# Patient Record
Sex: Male | Born: 1935 | Race: White | Hispanic: No | Smoking: Former smoker
Health system: Southern US, Community
[De-identification: ages and names within clinical notes are randomized; demographics above are authoritative.]

## PROBLEM LIST (undated history)

## (undated) DIAGNOSIS — K219 Gastro-esophageal reflux disease without esophagitis: Secondary | ICD-10-CM

## (undated) DIAGNOSIS — I340 Nonrheumatic mitral (valve) insufficiency: Secondary | ICD-10-CM

## (undated) DIAGNOSIS — C801 Malignant (primary) neoplasm, unspecified: Secondary | ICD-10-CM

## (undated) DIAGNOSIS — I5020 Unspecified systolic (congestive) heart failure: Secondary | ICD-10-CM

## (undated) DIAGNOSIS — I77810 Thoracic aortic ectasia: Secondary | ICD-10-CM

## (undated) DIAGNOSIS — I712 Thoracic aortic aneurysm, without rupture, unspecified: Secondary | ICD-10-CM

## (undated) DIAGNOSIS — N400 Enlarged prostate without lower urinary tract symptoms: Secondary | ICD-10-CM

## (undated) DIAGNOSIS — E538 Deficiency of other specified B group vitamins: Secondary | ICD-10-CM

## (undated) DIAGNOSIS — L719 Rosacea, unspecified: Secondary | ICD-10-CM

## (undated) DIAGNOSIS — I491 Atrial premature depolarization: Secondary | ICD-10-CM

## (undated) DIAGNOSIS — I4581 Long QT syndrome: Secondary | ICD-10-CM

## (undated) DIAGNOSIS — I428 Other cardiomyopathies: Secondary | ICD-10-CM

## (undated) DIAGNOSIS — I2510 Atherosclerotic heart disease of native coronary artery without angina pectoris: Secondary | ICD-10-CM

## (undated) DIAGNOSIS — I861 Scrotal varices: Secondary | ICD-10-CM

## (undated) DIAGNOSIS — G8929 Other chronic pain: Secondary | ICD-10-CM

## (undated) DIAGNOSIS — I351 Nonrheumatic aortic (valve) insufficiency: Secondary | ICD-10-CM

## (undated) DIAGNOSIS — R059 Cough, unspecified: Secondary | ICD-10-CM

## (undated) DIAGNOSIS — Z7901 Long term (current) use of anticoagulants: Secondary | ICD-10-CM

## (undated) DIAGNOSIS — S3609XA Other injury of spleen, initial encounter: Secondary | ICD-10-CM

## (undated) DIAGNOSIS — Z972 Presence of dental prosthetic device (complete) (partial): Secondary | ICD-10-CM

## (undated) DIAGNOSIS — I700 Atherosclerosis of aorta: Secondary | ICD-10-CM

## (undated) DIAGNOSIS — R05 Cough: Secondary | ICD-10-CM

## (undated) DIAGNOSIS — Z87891 Personal history of nicotine dependence: Secondary | ICD-10-CM

## (undated) DIAGNOSIS — C4490 Unspecified malignant neoplasm of skin, unspecified: Secondary | ICD-10-CM

## (undated) DIAGNOSIS — R062 Wheezing: Secondary | ICD-10-CM

## (undated) DIAGNOSIS — M25569 Pain in unspecified knee: Secondary | ICD-10-CM

## (undated) DIAGNOSIS — J189 Pneumonia, unspecified organism: Secondary | ICD-10-CM

## (undated) DIAGNOSIS — E785 Hyperlipidemia, unspecified: Secondary | ICD-10-CM

## (undated) DIAGNOSIS — N529 Male erectile dysfunction, unspecified: Secondary | ICD-10-CM

## (undated) DIAGNOSIS — I480 Paroxysmal atrial fibrillation: Secondary | ICD-10-CM

## (undated) DIAGNOSIS — R112 Nausea with vomiting, unspecified: Secondary | ICD-10-CM

## (undated) DIAGNOSIS — I4510 Unspecified right bundle-branch block: Secondary | ICD-10-CM

## (undated) DIAGNOSIS — M1990 Unspecified osteoarthritis, unspecified site: Secondary | ICD-10-CM

## (undated) DIAGNOSIS — I272 Pulmonary hypertension, unspecified: Secondary | ICD-10-CM

## (undated) DIAGNOSIS — J439 Emphysema, unspecified: Secondary | ICD-10-CM

## (undated) DIAGNOSIS — J90 Pleural effusion, not elsewhere classified: Secondary | ICD-10-CM

## (undated) DIAGNOSIS — K649 Unspecified hemorrhoids: Secondary | ICD-10-CM

## (undated) DIAGNOSIS — N183 Chronic kidney disease, stage 3 unspecified: Secondary | ICD-10-CM

## (undated) DIAGNOSIS — R600 Localized edema: Secondary | ICD-10-CM

## (undated) DIAGNOSIS — R609 Edema, unspecified: Secondary | ICD-10-CM

## (undated) DIAGNOSIS — Z9889 Other specified postprocedural states: Secondary | ICD-10-CM

## (undated) HISTORY — DX: Pain in unspecified knee: M25.569

## (undated) HISTORY — PX: TONSILLECTOMY: SUR1361

## (undated) HISTORY — DX: Atherosclerotic heart disease of native coronary artery without angina pectoris: I25.10

## (undated) HISTORY — PX: APPENDECTOMY: SHX54

## (undated) HISTORY — DX: Other injury of spleen, initial encounter: S36.09XA

## (undated) HISTORY — DX: Other disorders of bilirubin metabolism: E80.6

## (undated) HISTORY — DX: Other chronic pain: G89.29

## (undated) HISTORY — DX: Unspecified hemorrhoids: K64.9

## (undated) HISTORY — DX: Hyperlipidemia, unspecified: E78.5

## (undated) HISTORY — PX: HERNIA REPAIR: SHX51

## (undated) HISTORY — PX: ABDOMINAL SURGERY: SHX537

## (undated) HISTORY — PX: EYE SURGERY: SHX253

## (undated) HISTORY — PX: KNEE ARTHROSCOPY: SUR90

---

## 1985-12-16 HISTORY — PX: COLONOSCOPY: SHX174

## 2000-12-16 HISTORY — PX: CARDIAC CATHETERIZATION: SHX172

## 2001-01-29 HISTORY — PX: CARDIAC CATHETERIZATION: SHX172

## 2003-12-17 HISTORY — PX: CARDIAC CATHETERIZATION: SHX172

## 2004-07-20 ENCOUNTER — Inpatient Hospital Stay (HOSPITAL_BASED_OUTPATIENT_CLINIC_OR_DEPARTMENT_OTHER): Admission: RE | Admit: 2004-07-20 | Discharge: 2004-07-20 | Payer: Self-pay | Admitting: Cardiology

## 2004-07-20 HISTORY — PX: CARDIAC CATHETERIZATION: SHX172

## 2013-01-26 ENCOUNTER — Ambulatory Visit: Payer: Self-pay | Admitting: Family Medicine

## 2014-02-22 ENCOUNTER — Ambulatory Visit: Payer: Self-pay | Admitting: Family Medicine

## 2015-03-08 ENCOUNTER — Ambulatory Visit: Payer: Self-pay | Admitting: Family Medicine

## 2015-03-08 DIAGNOSIS — R51 Headache: Secondary | ICD-10-CM | POA: Diagnosis not present

## 2015-03-13 DIAGNOSIS — E785 Hyperlipidemia, unspecified: Secondary | ICD-10-CM | POA: Diagnosis not present

## 2015-03-13 DIAGNOSIS — G8929 Other chronic pain: Secondary | ICD-10-CM | POA: Diagnosis not present

## 2015-03-13 DIAGNOSIS — M25562 Pain in left knee: Secondary | ICD-10-CM | POA: Diagnosis not present

## 2015-03-15 ENCOUNTER — Ambulatory Visit: Payer: Self-pay | Admitting: Family Medicine

## 2015-03-31 DIAGNOSIS — M25562 Pain in left knee: Secondary | ICD-10-CM | POA: Diagnosis not present

## 2015-03-31 DIAGNOSIS — M25561 Pain in right knee: Secondary | ICD-10-CM | POA: Diagnosis not present

## 2015-03-31 DIAGNOSIS — M1712 Unilateral primary osteoarthritis, left knee: Secondary | ICD-10-CM | POA: Insufficient documentation

## 2015-03-31 DIAGNOSIS — M170 Bilateral primary osteoarthritis of knee: Secondary | ICD-10-CM | POA: Diagnosis not present

## 2015-06-12 DIAGNOSIS — L905 Scar conditions and fibrosis of skin: Secondary | ICD-10-CM | POA: Diagnosis not present

## 2015-06-12 DIAGNOSIS — D1800 Hemangioma unspecified site: Secondary | ICD-10-CM | POA: Diagnosis not present

## 2015-06-12 DIAGNOSIS — L570 Actinic keratosis: Secondary | ICD-10-CM | POA: Diagnosis not present

## 2015-06-12 DIAGNOSIS — L821 Other seborrheic keratosis: Secondary | ICD-10-CM | POA: Diagnosis not present

## 2015-06-12 DIAGNOSIS — L219 Seborrheic dermatitis, unspecified: Secondary | ICD-10-CM | POA: Diagnosis not present

## 2015-06-12 DIAGNOSIS — L719 Rosacea, unspecified: Secondary | ICD-10-CM | POA: Diagnosis not present

## 2015-06-12 DIAGNOSIS — L578 Other skin changes due to chronic exposure to nonionizing radiation: Secondary | ICD-10-CM | POA: Diagnosis not present

## 2015-06-12 DIAGNOSIS — Z1283 Encounter for screening for malignant neoplasm of skin: Secondary | ICD-10-CM | POA: Diagnosis not present

## 2015-08-23 ENCOUNTER — Ambulatory Visit (INDEPENDENT_AMBULATORY_CARE_PROVIDER_SITE_OTHER): Payer: Commercial Managed Care - HMO

## 2015-08-23 DIAGNOSIS — Z23 Encounter for immunization: Secondary | ICD-10-CM | POA: Diagnosis not present

## 2015-09-20 DIAGNOSIS — H2513 Age-related nuclear cataract, bilateral: Secondary | ICD-10-CM | POA: Diagnosis not present

## 2015-09-20 DIAGNOSIS — D3132 Benign neoplasm of left choroid: Secondary | ICD-10-CM | POA: Diagnosis not present

## 2015-10-04 ENCOUNTER — Encounter: Payer: Self-pay | Admitting: Family Medicine

## 2015-10-04 ENCOUNTER — Ambulatory Visit (INDEPENDENT_AMBULATORY_CARE_PROVIDER_SITE_OTHER): Payer: Commercial Managed Care - HMO | Admitting: Family Medicine

## 2015-10-04 VITALS — BP 112/68 | HR 69 | Temp 98.0°F | Resp 14 | Ht 73.0 in | Wt 222.4 lb

## 2015-10-04 DIAGNOSIS — N529 Male erectile dysfunction, unspecified: Secondary | ICD-10-CM | POA: Insufficient documentation

## 2015-10-04 DIAGNOSIS — N1831 Chronic kidney disease, stage 3a: Secondary | ICD-10-CM | POA: Insufficient documentation

## 2015-10-04 DIAGNOSIS — Z23 Encounter for immunization: Secondary | ICD-10-CM | POA: Insufficient documentation

## 2015-10-04 DIAGNOSIS — E785 Hyperlipidemia, unspecified: Secondary | ICD-10-CM | POA: Insufficient documentation

## 2015-10-04 DIAGNOSIS — H269 Unspecified cataract: Secondary | ICD-10-CM

## 2015-10-04 DIAGNOSIS — R519 Headache, unspecified: Secondary | ICD-10-CM | POA: Insufficient documentation

## 2015-10-04 DIAGNOSIS — L989 Disorder of the skin and subcutaneous tissue, unspecified: Secondary | ICD-10-CM | POA: Insufficient documentation

## 2015-10-04 DIAGNOSIS — R739 Hyperglycemia, unspecified: Secondary | ICD-10-CM | POA: Insufficient documentation

## 2015-10-04 DIAGNOSIS — N400 Enlarged prostate without lower urinary tract symptoms: Secondary | ICD-10-CM | POA: Insufficient documentation

## 2015-10-04 DIAGNOSIS — M19019 Primary osteoarthritis, unspecified shoulder: Secondary | ICD-10-CM | POA: Insufficient documentation

## 2015-10-04 DIAGNOSIS — R51 Headache: Secondary | ICD-10-CM

## 2015-10-04 DIAGNOSIS — M179 Osteoarthritis of knee, unspecified: Secondary | ICD-10-CM | POA: Insufficient documentation

## 2015-10-04 DIAGNOSIS — I4581 Long QT syndrome: Secondary | ICD-10-CM | POA: Insufficient documentation

## 2015-10-04 DIAGNOSIS — I861 Scrotal varices: Secondary | ICD-10-CM | POA: Insufficient documentation

## 2015-10-04 DIAGNOSIS — K649 Unspecified hemorrhoids: Secondary | ICD-10-CM | POA: Insufficient documentation

## 2015-10-04 DIAGNOSIS — L570 Actinic keratosis: Secondary | ICD-10-CM | POA: Insufficient documentation

## 2015-10-04 DIAGNOSIS — M25569 Pain in unspecified knee: Secondary | ICD-10-CM | POA: Insufficient documentation

## 2015-10-04 DIAGNOSIS — R21 Rash and other nonspecific skin eruption: Secondary | ICD-10-CM | POA: Insufficient documentation

## 2015-10-04 DIAGNOSIS — M5136 Other intervertebral disc degeneration, lumbar region: Secondary | ICD-10-CM | POA: Insufficient documentation

## 2015-10-04 DIAGNOSIS — M1710 Unilateral primary osteoarthritis, unspecified knee: Secondary | ICD-10-CM | POA: Insufficient documentation

## 2015-10-04 DIAGNOSIS — N183 Chronic kidney disease, stage 3 unspecified: Secondary | ICD-10-CM | POA: Insufficient documentation

## 2015-10-04 DIAGNOSIS — I2510 Atherosclerotic heart disease of native coronary artery without angina pectoris: Secondary | ICD-10-CM | POA: Insufficient documentation

## 2015-10-04 DIAGNOSIS — N189 Chronic kidney disease, unspecified: Secondary | ICD-10-CM | POA: Insufficient documentation

## 2015-10-04 NOTE — Progress Notes (Signed)
Name: Anthony Mosley   MRN: [id number]    DOB: 03/10/1935   Date:10/04/2015       Progress Note  Subjective  Chief Complaint  Chief Complaint  Patient presents with  . Referral    Patient was recently seen at [facility] and was told that he need cataract surgery. He is here to get a referral so that they can proceed with the surgical consult on Monday.    HPI  Pt. Is here for referral to Ophthalmology for Cataract Surgery. Initial appointment with Ophthalmology scheduled for October 24 th, 2016 by Dr. Warren Lacy. Pt. Was diagnosed with cataracts 4-5 years ago.  Past Medical History  Diagnosis Date  . Hyperbilirubinemia   . CAD (coronary artery disease)   . Hemorrhoids   . Chronic knee pain     Past Surgical History  Procedure Laterality Date  . Colonoscopy  1987    Family History  Problem Relation Age of Onset  . Alzheimer's disease Mother   . Pulmonary embolism Father   . Diabetes Father   . Diabetes Brother     Social History   Social History  . Marital Status: Married    Spouse Name: N/A  . Number of Children: N/A  . Years of Education: N/A   Occupational History  . Not on file.   Social History Main Topics  . Smoking status: Former Research scientist (life sciences)  . Smokeless tobacco: Not on file     Comment: quit over 24 yrs ago  . Alcohol Use: No  . Drug Use: No  . Sexual Activity: No   Other Topics Concern  . Not on file   Social History Narrative  . No narrative on file     Current outpatient prescriptions:  .  aspirin 81 MG chewable tablet, Chew by mouth., Disp: , Rfl:  .  B COMPLEX VITAMINS PO, Take by mouth., Disp: , Rfl:  .  simvastatin (ZOCOR) 40 MG tablet, , Disp: , Rfl:   Allergies  Allergen Reactions  . Avodart  [Dutasteride] Itching  . Macrolides And Ketolides     Other reaction(s): Other (See Comments) Due to prolonged Q-T syndrome  . Norvasc  [Amlodipine]     bradycardia   Review of Systems  Eyes: Positive for blurred vision. Negative  for double vision and pain.    Objective  Filed Vitals:   10/04/15 0851  BP: 112/68  Pulse: 69  Temp: 98 F (36.7 C)  TempSrc: Oral  Resp: 14  Height: 6\' 1"  (1.854 m)  Weight: 222 lb 6.4 oz (100.88 kg)  SpO2: 94%    Physical Exam  Constitutional: He is oriented to person, place, and time and well-developed, well-nourished, and in no distress.  Eyes:  Cataracts in both eyes  Cardiovascular: Normal rate and regular rhythm.   Pulmonary/Chest: Effort normal and breath sounds normal.  Neurological: He is alert and oriented to person, place, and time.  Nursing note and vitals reviewed.  Assessment & Plan  1. Cataract of both eyes  - Ambulatory referral to Ophthalmology   [facility] 10/04/2015 9:05 AM

## 2015-10-09 DIAGNOSIS — H2511 Age-related nuclear cataract, right eye: Secondary | ICD-10-CM | POA: Diagnosis not present

## 2015-10-11 DIAGNOSIS — H2511 Age-related nuclear cataract, right eye: Secondary | ICD-10-CM | POA: Diagnosis not present

## 2015-10-17 ENCOUNTER — Encounter: Payer: Self-pay | Admitting: *Deleted

## 2015-10-24 ENCOUNTER — Ambulatory Visit
Admission: RE | Admit: 2015-10-24 | Discharge: 2015-10-24 | Disposition: A | Discharge status: Home / Self Care | Payer: Commercial Managed Care - HMO | Source: Ambulatory Visit | Attending: Ophthalmology | Admitting: Ophthalmology

## 2015-10-24 ENCOUNTER — Ambulatory Visit: Payer: Commercial Managed Care - HMO | Admitting: Anesthesiology

## 2015-10-24 ENCOUNTER — Encounter: Payer: Self-pay | Admitting: *Deleted

## 2015-10-24 ENCOUNTER — Encounter
Admission: RE | Disposition: A | Discharge status: Home / Self Care | Payer: Self-pay | Source: Ambulatory Visit | Attending: Ophthalmology

## 2015-10-24 DIAGNOSIS — Z79899 Other long term (current) drug therapy: Secondary | ICD-10-CM | POA: Diagnosis not present

## 2015-10-24 DIAGNOSIS — Z888 Allergy status to other drugs, medicaments and biological substances status: Secondary | ICD-10-CM | POA: Insufficient documentation

## 2015-10-24 DIAGNOSIS — H269 Unspecified cataract: Secondary | ICD-10-CM | POA: Diagnosis present

## 2015-10-24 DIAGNOSIS — Z9049 Acquired absence of other specified parts of digestive tract: Secondary | ICD-10-CM | POA: Insufficient documentation

## 2015-10-24 DIAGNOSIS — I2510 Atherosclerotic heart disease of native coronary artery without angina pectoris: Secondary | ICD-10-CM | POA: Diagnosis not present

## 2015-10-24 DIAGNOSIS — Z87891 Personal history of nicotine dependence: Secondary | ICD-10-CM | POA: Diagnosis not present

## 2015-10-24 DIAGNOSIS — M1990 Unspecified osteoarthritis, unspecified site: Secondary | ICD-10-CM | POA: Insufficient documentation

## 2015-10-24 DIAGNOSIS — L719 Rosacea, unspecified: Secondary | ICD-10-CM | POA: Insufficient documentation

## 2015-10-24 DIAGNOSIS — Z7982 Long term (current) use of aspirin: Secondary | ICD-10-CM | POA: Diagnosis not present

## 2015-10-24 DIAGNOSIS — H2511 Age-related nuclear cataract, right eye: Secondary | ICD-10-CM | POA: Diagnosis not present

## 2015-10-24 DIAGNOSIS — Z9889 Other specified postprocedural states: Secondary | ICD-10-CM | POA: Diagnosis not present

## 2015-10-24 DIAGNOSIS — E7800 Pure hypercholesterolemia, unspecified: Secondary | ICD-10-CM | POA: Insufficient documentation

## 2015-10-24 HISTORY — DX: Unspecified osteoarthritis, unspecified site: M19.90

## 2015-10-24 HISTORY — DX: Cough, unspecified: R05.9

## 2015-10-24 HISTORY — DX: Malignant (primary) neoplasm, unspecified: C80.1

## 2015-10-24 HISTORY — PX: CATARACT EXTRACTION W/PHACO: SHX586

## 2015-10-24 HISTORY — DX: Cough: R05

## 2015-10-24 HISTORY — DX: Rosacea, unspecified: L71.9

## 2015-10-24 HISTORY — DX: Edema, unspecified: R60.9

## 2015-10-24 SURGERY — PHACOEMULSIFICATION, CATARACT, WITH IOL INSERTION
Anesthesia: Monitor Anesthesia Care | Site: Eye | Laterality: Right | Wound class: Clean

## 2015-10-24 MED ORDER — EPINEPHRINE HCL 1 MG/ML IJ SOLN
INTRAMUSCULAR | Status: AC
  Filled 2015-10-24: qty 1

## 2015-10-24 MED ORDER — TETRACAINE HCL 0.5 % OP SOLN
OPHTHALMIC | Status: AC
  Administered 2015-10-24: 1 [drp] via OPHTHALMIC
  Filled 2015-10-24: qty 2

## 2015-10-24 MED ORDER — CARBACHOL 0.01 % IO SOLN
INTRAOCULAR | Status: DC | PRN
  Administered 2015-10-24: 0.5 mL via INTRAOCULAR

## 2015-10-24 MED ORDER — NA CHONDROIT SULF-NA HYALURON 40-17 MG/ML IO SOLN
INTRAOCULAR | Status: DC | PRN
  Administered 2015-10-24: 1 mL via INTRAOCULAR

## 2015-10-24 MED ORDER — SODIUM CHLORIDE 0.9 % IV SOLN
INTRAVENOUS | Status: DC
  Administered 2015-10-24 (×3): via INTRAVENOUS

## 2015-10-24 MED ORDER — FENTANYL CITRATE (PF) 100 MCG/2ML IJ SOLN
INTRAMUSCULAR | Status: DC | PRN
  Administered 2015-10-24: 50 ug via INTRAVENOUS

## 2015-10-24 MED ORDER — EPINEPHRINE HCL 1 MG/ML IJ SOLN
INTRAOCULAR | Status: DC | PRN
  Administered 2015-10-24: 1 mL via OPHTHALMIC

## 2015-10-24 MED ORDER — MOXIFLOXACIN HCL 0.5 % OP SOLN
OPHTHALMIC | Status: AC
  Filled 2015-10-24: qty 3

## 2015-10-24 MED ORDER — TETRACAINE HCL 0.5 % OP SOLN
1.0000 [drp] | OPHTHALMIC | Status: AC | PRN
  Administered 2015-10-24: 1 [drp] via OPHTHALMIC

## 2015-10-24 MED ORDER — CEFUROXIME OPHTHALMIC INJECTION 1 MG/0.1 ML
INJECTION | OPHTHALMIC | Status: DC | PRN
  Administered 2015-10-24: 0.1 mL via INTRACAMERAL

## 2015-10-24 MED ORDER — MOXIFLOXACIN HCL 0.5 % OP SOLN: 1.0000 [drp] | OPHTHALMIC | Status: DC | PRN

## 2015-10-24 MED ORDER — MOXIFLOXACIN HCL 0.5 % OP SOLN
OPHTHALMIC | Status: DC | PRN
  Administered 2015-10-24: 1 [drp] via OPHTHALMIC

## 2015-10-24 MED ORDER — NA CHONDROIT SULF-NA HYALURON 40-17 MG/ML IO SOLN
INTRAOCULAR | Status: AC
  Filled 2015-10-24: qty 1

## 2015-10-24 MED ORDER — ARMC OPHTHALMIC DILATING GEL
1.0000 "application " | OPHTHALMIC | Status: DC | PRN
  Administered 2015-10-24: 1 via OPHTHALMIC

## 2015-10-24 MED ORDER — POVIDONE-IODINE 5 % OP SOLN
OPHTHALMIC | Status: AC
  Administered 2015-10-24: 1 via OPHTHALMIC
  Filled 2015-10-24: qty 30

## 2015-10-24 MED ORDER — POVIDONE-IODINE 5 % OP SOLN
1.0000 "application " | OPHTHALMIC | Status: AC | PRN
  Administered 2015-10-24: 1 via OPHTHALMIC

## 2015-10-24 MED ORDER — CEFUROXIME OPHTHALMIC INJECTION 1 MG/0.1 ML
INJECTION | OPHTHALMIC | Status: AC
  Filled 2015-10-24: qty 0.1

## 2015-10-24 MED ORDER — ARMC OPHTHALMIC DILATING GEL
OPHTHALMIC | Status: AC
  Administered 2015-10-24: 1 via OPHTHALMIC
  Filled 2015-10-24: qty 0.25

## 2015-10-24 SURGICAL SUPPLY — 24 items
CANNULA ANT/CHMB 27G (MISCELLANEOUS) ×1 IMPLANT
CANNULA ANT/CHMB 27GA (MISCELLANEOUS) ×3 IMPLANT
CUP MEDICINE 2OZ PLAST GRAD ST (MISCELLANEOUS) ×3 IMPLANT
GLOVE BIO SURGEON STRL SZ8 (GLOVE) ×3 IMPLANT
GLOVE BIOGEL M 6.5 STRL (GLOVE) ×3 IMPLANT
GLOVE SURG LX 8.0 MICRO (GLOVE) ×2
GLOVE SURG LX STRL 8.0 MICRO (GLOVE) ×1 IMPLANT
GOWN STRL REUS W/ TWL LRG LVL3 (GOWN DISPOSABLE) ×2 IMPLANT
GOWN STRL REUS W/TWL LRG LVL3 (GOWN DISPOSABLE) ×6
LENS IOL TECNIS 21 (Intraocular Lens) ×1 IMPLANT
LENS IOL TECNIS 21.0 (Intraocular Lens) ×3 IMPLANT
LENS IOL TECNIS MONO 1P 21.0 (Intraocular Lens) IMPLANT
PACK CATARACT (MISCELLANEOUS) ×3 IMPLANT
PACK CATARACT BRASINGTON LX (MISCELLANEOUS) ×3 IMPLANT
PACK EYE AFTER SURG (MISCELLANEOUS) ×3 IMPLANT
SOL BSS BAG (MISCELLANEOUS) ×3
SOL PREP PVP 2OZ (MISCELLANEOUS) ×3
SOLUTION BSS BAG (MISCELLANEOUS) ×1 IMPLANT
SOLUTION PREP PVP 2OZ (MISCELLANEOUS) ×1 IMPLANT
SYR 3ML LL SCALE MARK (SYRINGE) ×3 IMPLANT
SYR 5ML LL (SYRINGE) ×3 IMPLANT
SYR TB 1ML 27GX1/2 LL (SYRINGE) ×3 IMPLANT
WATER STERILE IRR 1000ML POUR (IV SOLUTION) ×3 IMPLANT
WIPE NON LINTING 3.25X3.25 (MISCELLANEOUS) ×3 IMPLANT

## 2015-10-24 NOTE — Transfer of Care (Signed)
Immediate Anesthesia Transfer of Care Note  Patient: Anthony Mosley  Procedure(s) Performed: Procedure(s) with comments: CATARACT EXTRACTION PHACO AND INTRAOCULAR LENS PLACEMENT (IOC) (Right) - [place] 00:52 AP% 23.5 CDE 12.35 fluid pack lot # [id number]  Patient Location: PACU  Anesthesia Type:MAC  Level of Consciousness: awake  Airway & Oxygen Therapy: Patient Spontanous Breathing  Post-op Assessment: Report given to RN  Post vital signs: Reviewed and stable  Last Vitals:  Filed Vitals:   10/24/15 0901  BP: 121/77  Pulse: 57  Temp: 36.4 C  Resp: 16    Complications: No apparent anesthesia complications

## 2015-10-24 NOTE — Op Note (Signed)
PREOPERATIVE DIAGNOSIS:  Nuclear sclerotic cataract of the left eye.   POSTOPERATIVE DIAGNOSIS:  nuclear sclerotic cataract right eye   OPERATIVE PROCEDURE:  Procedure(s): CATARACT EXTRACTION PHACO AND INTRAOCULAR LENS PLACEMENT (IOC)   SURGEON:  [name], MD.   ANESTHESIA:   Anesthesiologist: Gunnar Bulla, MD CRNA: Allean Found, CRNA  1.      Managed anesthesia care. 2.      Topical tetracaine drops followed by 2% Xylocaine jelly applied in the preoperative holding area.   COMPLICATIONS:  None.   TECHNIQUE:   Stop and chop   DESCRIPTION OF PROCEDURE:  The patient was examined and consented in the preoperative holding area where the aforementioned topical anesthesia was applied to the left eye and then brought back to the Operating Room where the left eye was prepped and draped in the usual sterile ophthalmic fashion and a lid speculum was placed. A paracentesis was created with the side port blade and the anterior chamber was filled with viscoelastic. A near clear corneal incision was performed with the steel keratome. A continuous curvilinear capsulorrhexis was performed with a cystotome followed by the capsulorrhexis forceps. Hydrodissection and hydrodelineation were carried out with BSS on a blunt cannula. The lens was removed in a stop and chop  technique and the remaining cortical material was removed with the irrigation-aspiration handpiece. The capsular bag was inflated with viscoelastic and the Technis ZCB00 lens was placed in the capsular bag without complication. The remaining viscoelastic was removed from the eye with the irrigation-aspiration handpiece. The wounds were hydrated. The anterior chamber was flushed with Miostat and the eye was inflated to physiologic pressure. 0.1 mL of cefuroxime concentration 10 mg/mL was placed in the anterior chamber. The wounds were found to be water tight. The eye was dressed with Vigamox. The patient was given protective glasses to wear  throughout the day and a shield with which to sleep tonight. The patient was also given drops with which to begin a drop regimen today and will follow-up with me in one day.  Implant Name Type Inv. Item Serial No. Manufacturer Lot No. LRB No. Used  LENS IMPL INTRAOC ZCB00 21.0 - [id number] Intraocular Lens LENS IMPL INTRAOC ZCB00 21.0 [id number]   Right 1   Procedure(s) with comments: CATARACT EXTRACTION PHACO AND INTRAOCULAR LENS PLACEMENT (IOC) (Right) - [place] 00:52 AP% 23.5 CDE 12.35 fluid pack lot # [id number]  Electronically signed: [place] 10/24/2015 10:53 AM

## 2015-10-24 NOTE — H&P (Signed)
  All labs reviewed. Abnormal studies sent to patients PCP when indicated.  Previous H&P reviewed, patient examined, there are NO CHANGES.  Anthony Mosley LOUIS11/8/201610:18 AM

## 2015-10-24 NOTE — Anesthesia Preprocedure Evaluation (Signed)
Anesthesia Evaluation  Patient identified by MRN, date of birth, ID band Patient awake    Reviewed: Allergy & Precautions, NPO status , Patient's Chart, lab work & pertinent test results, reviewed documented beta blocker date and time   Airway Mallampati: III  TM Distance: >3 FB     Dental  (+) Chipped   Pulmonary former smoker,           Cardiovascular + CAD       Neuro/Psych    GI/Hepatic   Endo/Other    Renal/GU Renal InsufficiencyRenal disease     Musculoskeletal  (+) Arthritis ,   Abdominal   Peds  Hematology   Anesthesia Other Findings   Reproductive/Obstetrics                             Anesthesia Physical Anesthesia Plan  ASA: III  Anesthesia Plan: MAC   Post-op Pain Management:    Induction: Intravenous  Airway Management Planned: Nasal Cannula  Additional Equipment:   Intra-op Plan:   Post-operative Plan:   Informed Consent: I have reviewed the patients History and Physical, chart, labs and discussed the procedure including the risks, benefits and alternatives for the proposed anesthesia with the patient or authorized representative who has indicated his/her understanding and acceptance.     Plan Discussed with: CRNA  Anesthesia Plan Comments:         Anesthesia Quick Evaluation

## 2015-10-24 NOTE — Anesthesia Postprocedure Evaluation (Signed)
  Anesthesia Post-op Note  Patient: Anthony Mosley  Procedure(s) Performed: Procedure(s) with comments: CATARACT EXTRACTION PHACO AND INTRAOCULAR LENS PLACEMENT (IOC) (Right) - [place] 00:52 AP% 23.5 CDE 12.35 fluid pack lot # [id number]    Anesthesia type:MAC  Patient location: PACU  Post pain: Pain level controlled  Post assessment: Post-op Vital signs reviewed, Patient's Cardiovascular Status Stable, Respiratory Function Stable, Patent Airway and No signs of Nausea or vomiting  Post vital signs: Reviewed and stable  Last Vitals:  Filed Vitals:   10/24/15 0901  BP: 121/77  Pulse: 57  Temp: 36.4 C  Resp: 16    Level of consciousness: awake, alert  and patient cooperative  Complications: No apparent anesthesia complications

## 2015-10-24 NOTE — Anesthesia Procedure Notes (Addendum)
Procedure Name: MAC Date/Time: 10/24/2015 10:34 AM Performed by: Allean Found Pre-anesthesia Checklist: Patient identified, Emergency Drugs available, Suction available, Patient being monitored and Timeout performed   Performed by: Zacary Bauer Oxygen Delivery Method: Nasal cannula

## 2015-11-07 DIAGNOSIS — H2512 Age-related nuclear cataract, left eye: Secondary | ICD-10-CM | POA: Diagnosis not present

## 2015-11-08 ENCOUNTER — Encounter: Payer: Self-pay | Admitting: *Deleted

## 2015-11-14 ENCOUNTER — Ambulatory Visit: Payer: Commercial Managed Care - HMO | Admitting: Anesthesiology

## 2015-11-14 ENCOUNTER — Encounter
Admission: RE | Disposition: A | Discharge status: Home / Self Care | Payer: Self-pay | Source: Ambulatory Visit | Attending: Ophthalmology

## 2015-11-14 ENCOUNTER — Ambulatory Visit
Admission: RE | Admit: 2015-11-14 | Discharge: 2015-11-14 | Disposition: A | Discharge status: Home / Self Care | Payer: Commercial Managed Care - HMO | Source: Ambulatory Visit | Attending: Ophthalmology | Admitting: Ophthalmology

## 2015-11-14 ENCOUNTER — Encounter: Payer: Self-pay | Admitting: *Deleted

## 2015-11-14 DIAGNOSIS — H2512 Age-related nuclear cataract, left eye: Secondary | ICD-10-CM | POA: Diagnosis not present

## 2015-11-14 DIAGNOSIS — Z9049 Acquired absence of other specified parts of digestive tract: Secondary | ICD-10-CM | POA: Diagnosis not present

## 2015-11-14 DIAGNOSIS — I2510 Atherosclerotic heart disease of native coronary artery without angina pectoris: Secondary | ICD-10-CM | POA: Diagnosis not present

## 2015-11-14 DIAGNOSIS — Z7982 Long term (current) use of aspirin: Secondary | ICD-10-CM | POA: Insufficient documentation

## 2015-11-14 DIAGNOSIS — Z9889 Other specified postprocedural states: Secondary | ICD-10-CM | POA: Insufficient documentation

## 2015-11-14 DIAGNOSIS — E7800 Pure hypercholesterolemia, unspecified: Secondary | ICD-10-CM | POA: Insufficient documentation

## 2015-11-14 DIAGNOSIS — Z888 Allergy status to other drugs, medicaments and biological substances status: Secondary | ICD-10-CM | POA: Insufficient documentation

## 2015-11-14 DIAGNOSIS — M1990 Unspecified osteoarthritis, unspecified site: Secondary | ICD-10-CM | POA: Insufficient documentation

## 2015-11-14 DIAGNOSIS — Z85828 Personal history of other malignant neoplasm of skin: Secondary | ICD-10-CM | POA: Insufficient documentation

## 2015-11-14 DIAGNOSIS — L719 Rosacea, unspecified: Secondary | ICD-10-CM | POA: Insufficient documentation

## 2015-11-14 DIAGNOSIS — Z79899 Other long term (current) drug therapy: Secondary | ICD-10-CM | POA: Insufficient documentation

## 2015-11-14 DIAGNOSIS — H269 Unspecified cataract: Secondary | ICD-10-CM | POA: Diagnosis present

## 2015-11-14 HISTORY — DX: Wheezing: R06.2

## 2015-11-14 HISTORY — DX: Nausea with vomiting, unspecified: R11.2

## 2015-11-14 HISTORY — PX: CATARACT EXTRACTION W/PHACO: SHX586

## 2015-11-14 HISTORY — DX: Nausea with vomiting, unspecified: Z98.890

## 2015-11-14 SURGERY — PHACOEMULSIFICATION, CATARACT, WITH IOL INSERTION
Anesthesia: Monitor Anesthesia Care | Site: Eye | Laterality: Left | Wound class: Clean

## 2015-11-14 MED ORDER — EPINEPHRINE HCL 1 MG/ML IJ SOLN
INTRAOCULAR | Status: DC | PRN
  Administered 2015-11-14: 1 mL via OPHTHALMIC

## 2015-11-14 MED ORDER — ARMC OPHTHALMIC DILATING GEL
OPHTHALMIC | Status: AC
  Filled 2015-11-14: qty 0.25

## 2015-11-14 MED ORDER — MOXIFLOXACIN HCL 0.5 % OP SOLN
1.0000 [drp] | OPHTHALMIC | Status: DC | PRN
Start: 2015-11-14 — End: 2015-11-14

## 2015-11-14 MED ORDER — TETRACAINE HCL 0.5 % OP SOLN
OPHTHALMIC | Status: AC
  Administered 2015-11-14: 1 [drp] via OPHTHALMIC
  Filled 2015-11-14: qty 2

## 2015-11-14 MED ORDER — NA CHONDROIT SULF-NA HYALURON 40-17 MG/ML IO SOLN
INTRAOCULAR | Status: AC
  Filled 2015-11-14: qty 1

## 2015-11-14 MED ORDER — POVIDONE-IODINE 5 % OP SOLN
1.0000 "application " | OPHTHALMIC | Status: AC | PRN
  Administered 2015-11-14: 1 via OPHTHALMIC

## 2015-11-14 MED ORDER — CEFUROXIME OPHTHALMIC INJECTION 1 MG/0.1 ML
INJECTION | OPHTHALMIC | Status: AC
  Filled 2015-11-14: qty 0.1

## 2015-11-14 MED ORDER — POVIDONE-IODINE 5 % OP SOLN
OPHTHALMIC | Status: AC
  Administered 2015-11-14: 1 via OPHTHALMIC
  Filled 2015-11-14: qty 30

## 2015-11-14 MED ORDER — CEFUROXIME OPHTHALMIC INJECTION 1 MG/0.1 ML
INJECTION | OPHTHALMIC | Status: DC | PRN
  Administered 2015-11-14: .1 mL via INTRACAMERAL

## 2015-11-14 MED ORDER — EPINEPHRINE HCL 1 MG/ML IJ SOLN
INTRAMUSCULAR | Status: AC
  Filled 2015-11-14: qty 1

## 2015-11-14 MED ORDER — MIDAZOLAM HCL 2 MG/2ML IJ SOLN
INTRAMUSCULAR | Status: DC | PRN
  Administered 2015-11-14: 1 mg via INTRAVENOUS

## 2015-11-14 MED ORDER — MOXIFLOXACIN HCL 0.5 % OP SOLN
OPHTHALMIC | Status: DC | PRN
  Administered 2015-11-14: 1 [drp] via OPHTHALMIC

## 2015-11-14 MED ORDER — TETRACAINE HCL 0.5 % OP SOLN
1.0000 [drp] | OPHTHALMIC | Status: DC | PRN
  Administered 2015-11-14: 1 [drp] via OPHTHALMIC

## 2015-11-14 MED ORDER — MOXIFLOXACIN HCL 0.5 % OP SOLN
OPHTHALMIC | Status: AC
  Filled 2015-11-14: qty 3

## 2015-11-14 MED ORDER — SODIUM CHLORIDE 0.9 % IV SOLN
INTRAVENOUS | Status: DC
  Administered 2015-11-14: 09:00:00 via INTRAVENOUS

## 2015-11-14 MED ORDER — ARMC OPHTHALMIC DILATING GEL
1.0000 | OPHTHALMIC | Status: DC | PRN
Start: 2015-11-14 — End: 2015-11-14

## 2015-11-14 MED ORDER — NA CHONDROIT SULF-NA HYALURON 40-17 MG/ML IO SOLN
INTRAOCULAR | Status: DC | PRN
  Administered 2015-11-14: 1 mL via INTRAOCULAR

## 2015-11-14 MED ORDER — CARBACHOL 0.01 % IO SOLN
INTRAOCULAR | Status: DC | PRN
  Administered 2015-11-14: .5 mL via INTRAOCULAR

## 2015-11-14 SURGICAL SUPPLY — 23 items
CANNULA ANT/CHMB 27GA (MISCELLANEOUS) ×3 IMPLANT
CUP MEDICINE 2OZ PLAST GRAD ST (MISCELLANEOUS) ×3 IMPLANT
GLOVE BIO SURGEON STRL SZ8 (GLOVE) ×3 IMPLANT
GLOVE BIOGEL M 6.5 STRL (GLOVE) ×3 IMPLANT
GLOVE SURG LX 8.0 MICRO (GLOVE) ×2
GLOVE SURG LX STRL 8.0 MICRO (GLOVE) ×1 IMPLANT
GOWN STRL REUS W/ TWL LRG LVL3 (GOWN DISPOSABLE) ×2 IMPLANT
GOWN STRL REUS W/TWL LRG LVL3 (GOWN DISPOSABLE) ×6
LENS IOL TECNIS 21 (Intraocular Lens) ×1 IMPLANT
LENS IOL TECNIS 21.0 (Intraocular Lens) ×3 IMPLANT
LENS IOL TECNIS MONO 1P 21.0 (Intraocular Lens) ×1 IMPLANT
PACK CATARACT (MISCELLANEOUS) ×3 IMPLANT
PACK CATARACT BRASINGTON LX (MISCELLANEOUS) ×3 IMPLANT
PACK EYE AFTER SURG (MISCELLANEOUS) ×3 IMPLANT
SOL BSS BAG (MISCELLANEOUS) ×3
SOL PREP PVP 2OZ (MISCELLANEOUS) ×3
SOLUTION BSS BAG (MISCELLANEOUS) ×1 IMPLANT
SOLUTION PREP PVP 2OZ (MISCELLANEOUS) ×1 IMPLANT
SYR 3ML LL SCALE MARK (SYRINGE) ×3 IMPLANT
SYR 5ML LL (SYRINGE) ×3 IMPLANT
SYR TB 1ML 27GX1/2 LL (SYRINGE) ×3 IMPLANT
WATER STERILE IRR 1000ML POUR (IV SOLUTION) ×3 IMPLANT
WIPE NON LINTING 3.25X3.25 (MISCELLANEOUS) ×3 IMPLANT

## 2015-11-14 NOTE — Anesthesia Postprocedure Evaluation (Signed)
Anesthesia Post Note  Patient: Anthony Mosley  Procedure(s) Performed: Procedure(s) (LRB): CATARACT EXTRACTION PHACO AND INTRAOCULAR LENS PLACEMENT (IOC) (Left)  Patient location during evaluation: Other Anesthesia Type: MAC Level of consciousness: awake and alert Pain management: pain level controlled Respiratory status: spontaneous breathing Anesthetic complications: no    Last Vitals:  Filed Vitals:   11/14/15 0907 11/14/15 1046  BP: 120/71 105/70  Pulse: 57 59  Temp: 36.5 C 37.1 C  Resp: 18 16    Last Pain: There were no vitals filed for this visit.               Takya Vandivier S

## 2015-11-14 NOTE — Op Note (Signed)
PREOPERATIVE DIAGNOSIS:  Nuclear sclerotic cataract of the left eye.   POSTOPERATIVE DIAGNOSIS:  nuclear sclerotic cataract left eye   OPERATIVE PROCEDURE:  Procedure(s): CATARACT EXTRACTION PHACO AND INTRAOCULAR LENS PLACEMENT (IOC)   SURGEON:  [name], MD.   ANESTHESIA:   Anesthesiologist: Gunnar Bulla, MD CRNA: Johnna Acosta, CRNA  1.      Managed anesthesia care. 2.      Topical tetracaine drops followed by 2% Xylocaine jelly applied in the preoperative holding area.   COMPLICATIONS:  None.   TECHNIQUE:   Stop and chop   DESCRIPTION OF PROCEDURE:  The patient was examined and consented in the preoperative holding area where the aforementioned topical anesthesia was applied to the left eye and then brought back to the Operating Room where the left eye was prepped and draped in the usual sterile ophthalmic fashion and a lid speculum was placed. A paracentesis was created with the side port blade and the anterior chamber was filled with viscoelastic. A near clear corneal incision was performed with the steel keratome. A continuous curvilinear capsulorrhexis was performed with a cystotome followed by the capsulorrhexis forceps. Hydrodissection and hydrodelineation were carried out with BSS on a blunt cannula. The lens was removed in a stop and chop  technique and the remaining cortical material was removed with the irrigation-aspiration handpiece. The capsular bag was inflated with viscoelastic and the Technis ZCB00 lens was placed in the capsular bag without complication. The remaining viscoelastic was removed from the eye with the irrigation-aspiration handpiece. The wounds were hydrated. The anterior chamber was flushed with Miostat and the eye was inflated to physiologic pressure. 0.1 mL of cefuroxime concentration 10 mg/mL was placed in the anterior chamber. The wounds were found to be water tight. The eye was dressed with Vigamox. The patient was given protective glasses to  wear throughout the day and a shield with which to sleep tonight. The patient was also given drops with which to begin a drop regimen today and will follow-up with me in one day.  Implant Name Type Inv. Item Serial No. Manufacturer Lot No. LRB No. Used  LENS IMPL INTRAOC ZCB00 21.0 - [id number] Intraocular Lens LENS IMPL INTRAOC ZCB00 21.0 [id number]   Left 1   Procedure(s) with comments: CATARACT EXTRACTION PHACO AND INTRAOCULAR LENS PLACEMENT (IOC) (Left) - [place] 00:55 AP% 19.1 CDE 10.66 fluid pack lot # [id number]  Electronically signed: [place] 11/14/2015 10:41 AM

## 2015-11-14 NOTE — Discharge Instructions (Signed)
[facility]  DISCHARGE INSTRUCTIONS   The drugs that you were given will stay in your system until tomorrow so for the next 24 hours you should not:  Drive an automobile Make any legal decisions Drink any alcoholic beverage   You may resume regular meals tomorrow.  Today it is better to start with liquids and gradually work up to solid foods.  You may eat anything you prefer, but it is better to start with liquids, then soup and crackers, and gradually work up to solid foods.   Please notify your doctor immediately if you have any unusual bleeding, trouble breathing, redness and pain at the surgery site, drainage, fever, or pain not relieved by medication.   Your post-operative visit with Dr.                                     is: Date:                        Time:    Please call to schedule your post-operative visit.  Additional Instructions: Eye Surgery Discharge Instructions  Expect mild scratchy sensation or mild soreness. DO NOT RUB YOUR EYE!  The day of surgery: Minimal physical activity, but bed rest is not required No reading, computer work, or close hand work No bending, lifting, or straining. May watch TV  For 24 hours: No driving, legal decisions, or alcoholic beverages Safety precautions Eat anything you prefer: It is better to start with liquids, then soup then solid foods. _____ Eye patch should be worn until postoperative exam tomorrow. ____ Solar shield eyeglasses should be worn for comfort in the sunlight/patch while sleeping  Resume all regular medications including aspirin or Coumadin if these were discontinued prior to surgery. You may shower, bathe, shave, or wash your hair. Tylenol may be taken for mild discomfort.  Call your doctor if you experience significant pain, nausea, or vomiting, fever > 101 or other signs of infection. [phone] or [phone] Specific instructions:  Follow-up Information    Follow up  with PORFILIO,WILLIAM LOUIS, MD In 1 day.   Specialty:  Ophthalmology   Why:  november 30 at 10:10am   Contact information:   [place] [address] [phone]

## 2015-11-14 NOTE — Transfer of Care (Signed)
Immediate Anesthesia Transfer of Care Note  Patient: Anthony Mosley  Procedure(s) Performed: Procedure(s) with comments: CATARACT EXTRACTION PHACO AND INTRAOCULAR LENS PLACEMENT (IOC) (Left) - [place] 00:55 AP% 19.1 CDE 10.66 fluid pack lot # [id number] H  Patient Location: PACU  Anesthesia Type:MAC  Level of Consciousness: awake, alert  and oriented  Airway & Oxygen Therapy: Patient Spontanous Breathing  Post-op Assessment: Report given to RN and Post -op Vital signs reviewed and stable  Post vital signs: Reviewed and stable  Last Vitals:  Filed Vitals:   11/14/15 0907  BP: 120/71  Pulse: 57  Temp: 36.5 C  Resp: 18    Complications: No apparent anesthesia complications

## 2015-11-14 NOTE — H&P (Signed)
  All labs reviewed. Abnormal studies sent to patients PCP when indicated.  Previous H&P reviewed, patient examined, there are NO CHANGES.  Delcie Ruppert LOUIS11/29/201610:14 AM

## 2015-11-14 NOTE — Anesthesia Preprocedure Evaluation (Signed)
Anesthesia Evaluation  Patient identified by MRN, date of birth, ID band Patient awake    Reviewed: Allergy & Precautions, NPO status , Patient's Chart, lab work & pertinent test results, reviewed documented beta blocker date and time   History of Anesthesia Complications (+) PONV and history of anesthetic complications  Airway Mallampati: II  TM Distance: >3 FB     Dental  (+) Chipped   Pulmonary former smoker,           Cardiovascular + CAD       Neuro/Psych    GI/Hepatic   Endo/Other    Renal/GU Renal InsufficiencyRenal disease     Musculoskeletal  (+) Arthritis ,   Abdominal   Peds  Hematology   Anesthesia Other Findings   Reproductive/Obstetrics                             Anesthesia Physical Anesthesia Plan  ASA: III  Anesthesia Plan:    Post-op Pain Management:    Induction:   Airway Management Planned:   Additional Equipment:   Intra-op Plan:   Post-operative Plan:   Informed Consent: I have reviewed the patients History and Physical, chart, labs and discussed the procedure including the risks, benefits and alternatives for the proposed anesthesia with the patient or authorized representative who has indicated his/her understanding and acceptance.     Plan Discussed with: CRNA  Anesthesia Plan Comments:         Anesthesia Quick Evaluation

## 2015-11-14 NOTE — Anesthesia Procedure Notes (Signed)
Procedure Name: MAC Date/Time: 11/14/2015 10:22 AM Performed by: Johnna Acosta Pre-anesthesia Checklist: Patient identified, Emergency Drugs available, Suction available, Patient being monitored and Timeout performed Patient Re-evaluated:Patient Re-evaluated prior to inductionOxygen Delivery Method: Nasal cannula

## 2016-01-01 DIAGNOSIS — Z85828 Personal history of other malignant neoplasm of skin: Secondary | ICD-10-CM | POA: Diagnosis not present

## 2016-01-01 DIAGNOSIS — L814 Other melanin hyperpigmentation: Secondary | ICD-10-CM | POA: Diagnosis not present

## 2016-01-01 DIAGNOSIS — L578 Other skin changes due to chronic exposure to nonionizing radiation: Secondary | ICD-10-CM | POA: Diagnosis not present

## 2016-01-01 DIAGNOSIS — L570 Actinic keratosis: Secondary | ICD-10-CM | POA: Diagnosis not present

## 2016-01-01 DIAGNOSIS — L821 Other seborrheic keratosis: Secondary | ICD-10-CM | POA: Diagnosis not present

## 2016-01-01 DIAGNOSIS — L718 Other rosacea: Secondary | ICD-10-CM | POA: Diagnosis not present

## 2016-01-01 DIAGNOSIS — L720 Epidermal cyst: Secondary | ICD-10-CM | POA: Diagnosis not present

## 2016-01-01 DIAGNOSIS — L820 Inflamed seborrheic keratosis: Secondary | ICD-10-CM | POA: Diagnosis not present

## 2016-01-10 ENCOUNTER — Telehealth: Payer: Self-pay

## 2016-01-10 MED ORDER — SIMVASTATIN 40 MG PO TABS: 40.0000 mg | ORAL_TABLET | Freq: Every day | ORAL | Status: DC

## 2016-01-10 NOTE — Telephone Encounter (Signed)
Medication has been refilled and sent to [facility] 

## 2016-02-06 DIAGNOSIS — L570 Actinic keratosis: Secondary | ICD-10-CM | POA: Diagnosis not present

## 2016-03-08 DIAGNOSIS — L578 Other skin changes due to chronic exposure to nonionizing radiation: Secondary | ICD-10-CM | POA: Diagnosis not present

## 2016-03-08 DIAGNOSIS — L570 Actinic keratosis: Secondary | ICD-10-CM | POA: Diagnosis not present

## 2016-04-03 ENCOUNTER — Ambulatory Visit (INDEPENDENT_AMBULATORY_CARE_PROVIDER_SITE_OTHER): Payer: Commercial Managed Care - HMO | Admitting: Family Medicine

## 2016-04-03 ENCOUNTER — Encounter: Payer: Self-pay | Admitting: Family Medicine

## 2016-04-03 VITALS — BP 122/68 | HR 84 | Temp 97.5°F | Resp 18 | Ht 73.0 in | Wt 232.6 lb

## 2016-04-03 DIAGNOSIS — M25471 Effusion, right ankle: Secondary | ICD-10-CM | POA: Diagnosis not present

## 2016-04-03 DIAGNOSIS — M25472 Effusion, left ankle: Secondary | ICD-10-CM

## 2016-04-03 DIAGNOSIS — M1712 Unilateral primary osteoarthritis, left knee: Secondary | ICD-10-CM

## 2016-04-03 MED ORDER — FUROSEMIDE 20 MG PO TABS: 20.0000 mg | ORAL_TABLET | Freq: Every day | ORAL | Status: DC

## 2016-04-03 NOTE — Progress Notes (Signed)
Name: Anthony Mosley   MRN: [id number]    DOB: 06-Sep-1935   Date:04/03/2016       Progress Note  Subjective  Chief Complaint  Chief Complaint  Patient presents with  . Foot Swelling    for 3 weeks  . Foot Pain  . Knee Pain    HPI  Ankle Swelling: Pt. Presents for 1 week history of ankle swelling, present in both ankles, worse at night, relieved with supine position (laying down to sleep).   Knee Pain: Pt. Presents for left knee pain, present for over 2 months, has been seen by Orthopedics and had a Cortisone shot in the left knee which provided relief.  Requesting referral to Orthopedist for another Cortisone injection.   Past Medical History  Diagnosis Date  . Hyperbilirubinemia   . CAD (coronary artery disease)   . Hemorrhoids   . Chronic knee pain   . Arthritis   . Cancer ([place])     SKIN  . Rosacea   . Cough   . Edema     FEET/LEGS  . PONV (postoperative nausea and vomiting)     after first cataract  . Wheezing     Past Surgical History  Procedure Laterality Date  . Colonoscopy  1987  . Cardiac catheterization    . Appendectomy    . Hernia repair    . Knee arthroscopy    . Tonsillectomy    . Cataract extraction w/phaco Right 10/24/2015    Procedure: CATARACT EXTRACTION PHACO AND INTRAOCULAR LENS PLACEMENT (IOC);  Surgeon: [name], MD;  Location: [facility] ORS;  Service: Ophthalmology;  Laterality: Right;  [place] 00:52 AP% 23.5 CDE 12.35 fluid pack lot # [id number]  . Cataract extraction w/phaco Left 11/14/2015    Procedure: CATARACT EXTRACTION PHACO AND INTRAOCULAR LENS PLACEMENT (IOC);  Surgeon: [name], MD;  Location: [facility] ORS;  Service: Ophthalmology;  Laterality: Left;  [place] 00:55 AP% 19.1 CDE 10.66 fluid pack lot # [id number]    Family History  Problem Relation Age of Onset  . Alzheimer's disease Mother   . Pulmonary embolism Father   . Diabetes Father   . Diabetes Brother     Social History   Social History  . Marital Status:  Married    Spouse Name: N/A  . Number of Children: N/A  . Years of Education: N/A   Occupational History  . Not on file.   Social History Main Topics  . Smoking status: Former Research scientist (life sciences)  . Smokeless tobacco: Not on file     Comment: quit over 24 yrs ago  . Alcohol Use: No  . Drug Use: No  . Sexual Activity: No   Other Topics Concern  . Not on file   Social History Narrative     Current outpatient prescriptions:  .  aspirin 81 MG chewable tablet, Chew by mouth., Disp: , Rfl:  .  B COMPLEX VITAMINS PO, Take by mouth., Disp: , Rfl:  .  simvastatin (ZOCOR) 40 MG tablet, Take 1 tablet (40 mg total) by mouth daily at 6 PM., Disp: 90 tablet, Rfl: 1  Allergies  Allergen Reactions  . Avodart  [Dutasteride] Itching  . Macrolides And Ketolides     Other reaction(s): Other (See Comments) Due to prolonged Q-T syndrome  . Norvasc  [Amlodipine]     bradycardia     Review of Systems  Constitutional: Negative for fever and chills.  Respiratory: Positive for cough. Negative for shortness of breath.   Cardiovascular: Positive for leg  swelling. Negative for chest pain.  Musculoskeletal: Positive for joint pain.    Objective  Filed Vitals:   04/03/16 1446  BP: 122/68  Pulse: 84  Temp: 97.5 F (36.4 C)  Resp: 18  Height: 6\' 1"  (1.854 m)  Weight: 232 lb 9 oz (105.49 kg)  SpO2: 95%    Physical Exam  Constitutional: He is oriented to person, place, and time and well-developed, well-nourished, and in no distress.  HENT:  Head: Normocephalic and atraumatic.  Cardiovascular: Normal rate and regular rhythm.   Pulmonary/Chest: Effort normal and breath sounds normal.  Musculoskeletal:       Right ankle: He exhibits swelling (2+ pitting edma around the right ankle and distal LE, ).       Left ankle: He exhibits swelling (1+ pitting edema around the left ankle.).  Neurological: He is alert and oriented to person, place, and time.  Nursing note and vitals  reviewed.      Assessment & Plan  1. Swelling of both ankles Dependent edema, we'll start on diuretic therapy, obtain liver and kidney function. - Comprehensive Metabolic Panel (CMET) - furosemide (LASIX) 20 MG tablet; Take 1 tablet (20 mg total) by mouth daily.  Dispense: 30 tablet; Refill: 0  2. Primary osteoarthritis of left knee Referral to orthopedics for knee injection. - Ambulatory referral to Orthopedic Surgery   Lyrah Bradt [name] 04/03/2016 3:17 PM

## 2016-04-04 LAB — COMPREHENSIVE METABOLIC PANEL
A/G RATIO: 1.9 (ref 1.2–2.2)
ALK PHOS: 56 IU/L (ref 39–117)
ALT: 17 IU/L (ref 0–44)
AST: 21 IU/L (ref 0–40)
Albumin: 4.2 g/dL (ref 3.5–4.8)
BUN/Creatinine Ratio: 12 (ref 10–24)
BUN: 16 mg/dL (ref 8–27)
Bilirubin Total: 1.4 mg/dL — ABNORMAL HIGH (ref 0.0–1.2)
CO2: 24 mmol/L (ref 18–29)
Calcium: 9.5 mg/dL (ref 8.6–10.2)
Chloride: 100 mmol/L (ref 96–106)
Creatinine, Ser: 1.34 mg/dL — ABNORMAL HIGH (ref 0.76–1.27)
GFR calc Af Amer: 58 mL/min/{1.73_m2} — ABNORMAL LOW (ref >59)
GFR calc non Af Amer: 50 mL/min/{1.73_m2} — ABNORMAL LOW (ref >59)
GLOBULIN, TOTAL: 2.2 g/dL (ref 1.5–4.5)
Glucose: 96 mg/dL (ref 65–99)
POTASSIUM: 4.5 mmol/L (ref 3.5–5.2)
SODIUM: 140 mmol/L (ref 134–144)
Total Protein: 6.4 g/dL (ref 6.0–8.5)

## 2016-04-09 DIAGNOSIS — M170 Bilateral primary osteoarthritis of knee: Secondary | ICD-10-CM | POA: Diagnosis not present

## 2016-05-02 ENCOUNTER — Encounter: Payer: Self-pay | Admitting: Family Medicine

## 2016-05-02 ENCOUNTER — Ambulatory Visit (INDEPENDENT_AMBULATORY_CARE_PROVIDER_SITE_OTHER): Payer: Commercial Managed Care - HMO | Admitting: Family Medicine

## 2016-05-02 VITALS — BP 134/70 | HR 67 | Temp 98.1°F | Resp 16 | Ht 73.0 in | Wt 226.5 lb

## 2016-05-02 DIAGNOSIS — N183 Chronic kidney disease, stage 3 unspecified: Secondary | ICD-10-CM

## 2016-05-02 DIAGNOSIS — E785 Hyperlipidemia, unspecified: Secondary | ICD-10-CM

## 2016-05-02 DIAGNOSIS — M25472 Effusion, left ankle: Secondary | ICD-10-CM

## 2016-05-02 DIAGNOSIS — M25471 Effusion, right ankle: Secondary | ICD-10-CM | POA: Diagnosis not present

## 2016-05-02 NOTE — Progress Notes (Signed)
Name: Anthony Mosley   MRN: [id number]    DOB: 1935/11/11   Date:05/02/2016       Progress Note  Subjective  Chief Complaint  Chief Complaint  Patient presents with  . Follow-up    1 month follow up ankle swelling    Hyperlipidemia This is a chronic problem. Recent lipid tests were reviewed and are normal. Pertinent negatives include no chest pain, myalgias or shortness of breath (sometimes gets 'winded easy'). Current antihyperlipidemic treatment includes statins. There are no compliance problems.     Ankle Swelling: Bilateral ankle swelling last month, was started on Furosemide 20 mg daily, the swelling improved, then he stopped taking the Furosemide. Today, he reports swelling has almost gone.   Past Medical History  Diagnosis Date  . Hyperbilirubinemia   . CAD (coronary artery disease)   . Hemorrhoids   . Chronic knee pain   . Arthritis   . Cancer ([place])     SKIN  . Rosacea   . Cough   . Edema     FEET/LEGS  . PONV (postoperative nausea and vomiting)     after first cataract  . Wheezing     Past Surgical History  Procedure Laterality Date  . Colonoscopy  1987  . Cardiac catheterization    . Appendectomy    . Hernia repair    . Knee arthroscopy    . Tonsillectomy    . Cataract extraction w/phaco Right 10/24/2015    Procedure: CATARACT EXTRACTION PHACO AND INTRAOCULAR LENS PLACEMENT (IOC);  Surgeon: [name], MD;  Location: [facility] ORS;  Service: Ophthalmology;  Laterality: Right;  [place] 00:52 AP% 23.5 CDE 12.35 fluid pack lot # [id number]  . Cataract extraction w/phaco Left 11/14/2015    Procedure: CATARACT EXTRACTION PHACO AND INTRAOCULAR LENS PLACEMENT (IOC);  Surgeon: [name], MD;  Location: [facility];  Service: Ophthalmology;  Laterality: Left;  [place] 00:55 AP% 19.1 CDE 10.66 fluid pack lot # [id number]    Family History  Problem Relation Age of Onset  . Alzheimer's disease Mother   . Pulmonary embolism Father   . Diabetes Father   . Diabetes  Brother     Social History   Social History  . Marital Status: Married    Spouse Name: N/A  . Number of Children: N/A  . Years of Education: N/A   Occupational History  . Not on file.   Social History Main Topics  . Smoking status: Former Research scientist (life sciences)  . Smokeless tobacco: Not on file     Comment: quit over 24 yrs ago  . Alcohol Use: No  . Drug Use: No  . Sexual Activity: No   Other Topics Concern  . Not on file   Social History Narrative     Current outpatient prescriptions:  .  aspirin 81 MG chewable tablet, Chew by mouth., Disp: , Rfl:  .  B COMPLEX VITAMINS PO, Take by mouth., Disp: , Rfl:  .  furosemide (LASIX) 20 MG tablet, Take 1 tablet (20 mg total) by mouth daily., Disp: 30 tablet, Rfl: 0 .  simvastatin (ZOCOR) 40 MG tablet, Take 1 tablet (40 mg total) by mouth daily at 6 PM., Disp: 90 tablet, Rfl: 1  Allergies  Allergen Reactions  . Avodart  [Dutasteride] Itching  . Macrolides And Ketolides     Other reaction(s): Other (See Comments) Due to prolonged Q-T syndrome  . Norvasc  [Amlodipine]     bradycardia    Review of Systems  Constitutional: Negative for fever  and chills.  Respiratory: Positive for cough (occasional cough.). Negative for sputum production, shortness of breath (sometimes gets 'winded easy') and wheezing.   Cardiovascular: Negative for chest pain.  Musculoskeletal: Negative for myalgias.    Objective  Filed Vitals:   05/02/16 1418  BP: 134/70  Pulse: 67  Temp: 98.1 F (36.7 C)  TempSrc: Oral  Resp: 16  Height: 6\' 1"  (1.854 m)  Weight: 226 lb 8 oz (102.74 kg)  SpO2: 94%    Physical Exam  Constitutional: He is oriented to person, place, and time and well-developed, well-nourished, and in no distress.  HENT:  Head: Normocephalic and atraumatic.  Cardiovascular: Normal rate and regular rhythm.   Pulmonary/Chest: Effort normal and breath sounds normal.  Abdominal: Soft. Bowel sounds are normal.  Musculoskeletal:       Right  ankle: He exhibits no swelling.       Left ankle: He exhibits no swelling.  Neurological: He is alert and oriented to person, place, and time.  Nursing note and vitals reviewed.     Assessment & Plan  1. Dyslipidemia Obtain FLP, adjust medication as necessary - Lipid Profile  2. Swelling of both ankles Resolved, patient has been off the diuretic since he works a fair amount out in the sun. We'll continue to monitor and keep him off diuretics for now  3. Hyperbilirubinemia  - Hepatic function panel  4. Chronic kidney disease (CKD), stage III (moderate) GFR is stable at 50, will repeat in 3-4 months   Ronia Hazelett [name] 05/02/2016 2:24 PM

## 2016-05-03 DIAGNOSIS — E785 Hyperlipidemia, unspecified: Secondary | ICD-10-CM | POA: Diagnosis not present

## 2016-05-04 LAB — LIPID PANEL
Chol/HDL Ratio: 2.9 ratio units (ref 0.0–5.0)
Cholesterol, Total: 143 mg/dL (ref 100–199)
HDL: 49 mg/dL (ref >39)
LDL Calculated: 78 mg/dL (ref 0–99)
Triglycerides: 79 mg/dL (ref 0–149)
VLDL CHOLESTEROL CAL: 16 mg/dL (ref 5–40)

## 2016-05-04 LAB — HEPATIC FUNCTION PANEL
ALT: 16 IU/L (ref 0–44)
AST: 19 IU/L (ref 0–40)
Albumin: 4.6 g/dL (ref 3.5–4.8)
Alkaline Phosphatase: 56 IU/L (ref 39–117)
BILIRUBIN TOTAL: 1.7 mg/dL — AB (ref 0.0–1.2)
BILIRUBIN, DIRECT: 0.37 mg/dL (ref 0.00–0.40)
TOTAL PROTEIN: 6.8 g/dL (ref 6.0–8.5)

## 2016-05-22 NOTE — Addendum Note (Signed)
Addended byManuella Ghazi, Jader Desai A A on: 05/22/2016 05:00 PM   Modules accepted: Orders

## 2016-06-03 ENCOUNTER — Ambulatory Visit
Admission: RE | Admit: 2016-06-03 | Discharge: 2016-06-03 | Disposition: A | Discharge status: Home / Self Care | Payer: Commercial Managed Care - HMO | Source: Ambulatory Visit | Attending: Family Medicine | Admitting: Family Medicine

## 2016-06-03 DIAGNOSIS — R1011 Right upper quadrant pain: Secondary | ICD-10-CM | POA: Diagnosis not present

## 2016-06-14 DIAGNOSIS — H02059 Trichiasis without entropian unspecified eye, unspecified eyelid: Secondary | ICD-10-CM | POA: Diagnosis not present

## 2016-07-15 ENCOUNTER — Other Ambulatory Visit: Payer: Self-pay | Admitting: Family Medicine

## 2016-08-30 ENCOUNTER — Telehealth: Payer: Self-pay | Admitting: Family Medicine

## 2016-08-30 NOTE — Telephone Encounter (Signed)
Patient must make an appointment to discuss referral and for documentation purposes.

## 2016-09-04 ENCOUNTER — Ambulatory Visit (INDEPENDENT_AMBULATORY_CARE_PROVIDER_SITE_OTHER): Payer: Commercial Managed Care - HMO | Admitting: Family Medicine

## 2016-09-04 ENCOUNTER — Encounter: Payer: Self-pay | Admitting: Family Medicine

## 2016-09-04 VITALS — BP 132/73 | HR 60 | Temp 97.8°F | Resp 15 | Ht 73.0 in | Wt 223.0 lb

## 2016-09-04 DIAGNOSIS — Z85828 Personal history of other malignant neoplasm of skin: Secondary | ICD-10-CM | POA: Diagnosis not present

## 2016-09-04 DIAGNOSIS — H02009 Unspecified entropion of unspecified eye, unspecified eyelid: Secondary | ICD-10-CM | POA: Insufficient documentation

## 2016-09-04 DIAGNOSIS — Z08 Encounter for follow-up examination after completed treatment for malignant neoplasm: Secondary | ICD-10-CM | POA: Diagnosis not present

## 2016-09-04 NOTE — Progress Notes (Signed)
Name: Anthony Mosley   MRN: [id number]    DOB: [birth year]   Date:09/04/2016       Progress Note  Subjective  Chief Complaint  Chief Complaint  Patient presents with  . Referral    Dermatology / Eye    HPI  Pt. Presents to obtain specialty referrals.  Ophthalmology: Had cataract surgery in 2015 and 2016 and now notices itching in both eyes. Dr. George Ina examined him and noticed ingrowing eyelashes which were irritating his eyeballs and Dr. George Ina plucked out some eyelashes and recommended that he should follow up.   Dermatology: Pt. Requesting referral to Dermatology for follow up. He has history of skin cancer, excised over 10 years ago and now follows up every 6 months. No evidence of recurrence    Past Medical History:  Diagnosis Date  . Arthritis   . CAD (coronary artery disease)   . Cancer ([place])    SKIN  . Chronic knee pain   . Cough   . Edema    FEET/LEGS  . Hemorrhoids   . Hyperbilirubinemia   . PONV (postoperative nausea and vomiting)    after first cataract  . Rosacea   . Wheezing     Past Surgical History:  Procedure Laterality Date  . APPENDECTOMY    . CARDIAC CATHETERIZATION    . CATARACT EXTRACTION W/PHACO Right 10/24/2015   Procedure: CATARACT EXTRACTION PHACO AND INTRAOCULAR LENS PLACEMENT (IOC);  Surgeon: [name], MD;  Location: [facility];  Service: Ophthalmology;  Laterality: Right;  [place] 00:52 AP% 23.5 CDE 12.35 fluid pack lot # [id number]  . CATARACT EXTRACTION W/PHACO Left 11/14/2015   Procedure: CATARACT EXTRACTION PHACO AND INTRAOCULAR LENS PLACEMENT (IOC);  Surgeon: [name], MD;  Location: [facility];  Service: Ophthalmology;  Laterality: Left;  [place] 00:55 AP% 19.1 CDE 10.66 fluid pack lot # [id number]  . COLONOSCOPY  1987  . HERNIA REPAIR    . KNEE ARTHROSCOPY    . TONSILLECTOMY      Family History  Problem Relation Age of Onset  . Alzheimer's disease Mother   . Pulmonary embolism Father   . Diabetes Father   .  Diabetes Brother     Social History   Social History  . Marital status: Married    Spouse name: N/A  . Number of children: N/A  . Years of education: N/A   Occupational History  . Not on file.   Social History Main Topics  . Smoking status: Former Research scientist (life sciences)  . Smokeless tobacco: Never Used     Comment: quit over 24 yrs ago  . Alcohol use No  . Drug use: No  . Sexual activity: No   Other Topics Concern  . Not on file   Social History Narrative  . No narrative on file     Current Outpatient Prescriptions:  .  aspirin 81 MG chewable tablet, Chew by mouth., Disp: , Rfl:  .  B COMPLEX VITAMINS PO, Take by mouth., Disp: , Rfl:  .  furosemide (LASIX) 20 MG tablet, Take 1 tablet (20 mg total) by mouth daily., Disp: 30 tablet, Rfl: 0 .  simvastatin (ZOCOR) 40 MG tablet, TAKE 1 TABLET EVERY DAY  AT  6PM, Disp: 90 tablet, Rfl: 0  Allergies  Allergen Reactions  . Avodart  [Dutasteride] Itching  . Macrolides And Ketolides     Other reaction(s): Other (See Comments) Due to prolonged Q-T syndrome  . Norvasc  [Amlodipine]     bradycardia     Review  of Systems  Constitutional: Negative for chills and fever.  Eyes: Positive for blurred vision. Negative for double vision and photophobia.  Skin: Negative for itching and rash.      Objective  Vitals:   09/04/16 0835  BP: 132/73  Pulse: 60  Resp: 15  Temp: 97.8 F (36.6 C)  TempSrc: Oral  SpO2: 96%  Weight: 223 lb (101.2 kg)  Height: 6\' 1"  (1.854 m)    Physical Exam  Constitutional: He is oriented to person, place, and time and well-developed, well-nourished, and in no distress.  Eyes: Conjunctivae are normal.  Cardiovascular: Normal rate, regular rhythm and normal heart sounds.   No murmur heard. Pulmonary/Chest: Effort normal and breath sounds normal.  Neurological: He is alert and oriented to person, place, and time.  Psychiatric: Mood, memory, affect and judgment normal.  Nursing note and vitals  reviewed.    Assessment & Plan  1. Encounter for follow-up surveillance of skin cancer Referral to Dermatology - Ambulatory referral to Dermatology  2. Eyelashes turned in, unspecified laterality Referral to Ophthalmolgoy. - Ambulatory referral to Ophthalmology   [facility] 09/04/2016 8:53 AM

## 2016-09-09 DIAGNOSIS — L821 Other seborrheic keratosis: Secondary | ICD-10-CM | POA: Diagnosis not present

## 2016-09-09 DIAGNOSIS — L578 Other skin changes due to chronic exposure to nonionizing radiation: Secondary | ICD-10-CM | POA: Diagnosis not present

## 2016-09-09 DIAGNOSIS — Z1283 Encounter for screening for malignant neoplasm of skin: Secondary | ICD-10-CM | POA: Diagnosis not present

## 2016-09-09 DIAGNOSIS — Z85828 Personal history of other malignant neoplasm of skin: Secondary | ICD-10-CM | POA: Diagnosis not present

## 2016-09-09 DIAGNOSIS — D692 Other nonthrombocytopenic purpura: Secondary | ICD-10-CM | POA: Diagnosis not present

## 2016-09-09 DIAGNOSIS — L814 Other melanin hyperpigmentation: Secondary | ICD-10-CM | POA: Diagnosis not present

## 2016-09-09 DIAGNOSIS — L570 Actinic keratosis: Secondary | ICD-10-CM | POA: Diagnosis not present

## 2016-09-12 DIAGNOSIS — H02059 Trichiasis without entropian unspecified eye, unspecified eyelid: Secondary | ICD-10-CM | POA: Diagnosis not present

## 2016-09-16 ENCOUNTER — Ambulatory Visit (INDEPENDENT_AMBULATORY_CARE_PROVIDER_SITE_OTHER): Payer: Commercial Managed Care - HMO

## 2016-09-16 DIAGNOSIS — Z23 Encounter for immunization: Secondary | ICD-10-CM

## 2016-11-19 ENCOUNTER — Other Ambulatory Visit: Payer: Self-pay | Admitting: Family Medicine

## 2016-12-05 ENCOUNTER — Ambulatory Visit: Payer: Commercial Managed Care - HMO | Admitting: Family Medicine

## 2016-12-11 ENCOUNTER — Ambulatory Visit (INDEPENDENT_AMBULATORY_CARE_PROVIDER_SITE_OTHER): Payer: Commercial Managed Care - HMO | Admitting: Family Medicine

## 2016-12-11 ENCOUNTER — Encounter: Payer: Self-pay | Admitting: Family Medicine

## 2016-12-11 VITALS — BP 130/64 | HR 74 | Temp 98.7°F | Resp 15 | Ht 73.0 in | Wt 231.7 lb

## 2016-12-11 DIAGNOSIS — M1711 Unilateral primary osteoarthritis, right knee: Secondary | ICD-10-CM

## 2016-12-11 NOTE — Progress Notes (Signed)
Name: Anthony Mosley   MRN: [id number]    DOB: 12-Feb-1935   Date:12/11/2016       Progress Note  Subjective  Chief Complaint  Chief Complaint  Patient presents with  . Knee Pain    Rt knee    Knee Pain   There was no injury mechanism. The pain is present in the right knee. The quality of the pain is described as aching and shooting. The pain is at a severity of 2/10. The pain is moderate. The pain has been fluctuating since onset. Pertinent negatives include no loss of motion, muscle weakness, numbness or tingling. Associated symptoms comments: Sometimes feels like the knee is going in the lateral direction when walking, occasional pain in the back of the knee and tightness in the right calf muscle.[name] He has tried NSAIDs and rest (in the past, he has received injections to his right and left knee, had surgery for meniscal tear by Dr. Marry Guan) for the symptoms.    Past Medical History:  Diagnosis Date  . Arthritis   . CAD (coronary artery disease)   . Cancer ([place])    SKIN  . Chronic knee pain   . Cough   . Edema    FEET/LEGS  . Hemorrhoids   . Hyperbilirubinemia   . PONV (postoperative nausea and vomiting)    after first cataract  . Rosacea   . Wheezing     Past Surgical History:  Procedure Laterality Date  . APPENDECTOMY    . CARDIAC CATHETERIZATION    . CATARACT EXTRACTION W/PHACO Right 10/24/2015   Procedure: CATARACT EXTRACTION PHACO AND INTRAOCULAR LENS PLACEMENT (IOC);  Surgeon: [name], MD;  Location: [facility];  Service: Ophthalmology;  Laterality: Right;  [place] 00:52 AP% 23.5 CDE 12.35 fluid pack lot # [id number]  . CATARACT EXTRACTION W/PHACO Left 11/14/2015   Procedure: CATARACT EXTRACTION PHACO AND INTRAOCULAR LENS PLACEMENT (IOC);  Surgeon: [name], MD;  Location: [facility];  Service: Ophthalmology;  Laterality: Left;  [place] 00:55 AP% 19.1 CDE 10.66 fluid pack lot # [id number] H  . COLONOSCOPY  1987  . HERNIA REPAIR    . KNEE ARTHROSCOPY    .  TONSILLECTOMY      Family History  Problem Relation Age of Onset  . Alzheimer's disease Mother   . Pulmonary embolism Father   . Diabetes Father   . Diabetes Brother     Social History   Social History  . Marital status: Married    Spouse name: N/A  . Number of children: N/A  . Years of education: N/A   Occupational History  . Not on file.   Social History Main Topics  . Smoking status: Former Research scientist (life sciences)  . Smokeless tobacco: Never Used     Comment: quit over 24 yrs ago  . Alcohol use No  . Drug use: No  . Sexual activity: No   Other Topics Concern  . Not on file   Social History Narrative  . No narrative on file     Current Outpatient Prescriptions:  .  aspirin 81 MG chewable tablet, Chew by mouth., Disp: , Rfl:  .  B COMPLEX VITAMINS PO, Take by mouth., Disp: , Rfl:  .  simvastatin (ZOCOR) 40 MG tablet, TAKE 1 TABLET EVERY DAY  AT  6PM, Disp: 90 tablet, Rfl: 0 .  furosemide (LASIX) 20 MG tablet, Take 1 tablet (20 mg total) by mouth daily. (Patient not taking: Reported on 12/11/2016), Disp: 30 tablet, Rfl: 0  Allergies  Allergen Reactions  . Avodart  [Dutasteride] Itching  . Macrolides And Ketolides     Other reaction(s): Other (See Comments) Due to prolonged Q-T syndrome  . Norvasc  [Amlodipine]     bradycardia     Review of Systems  Neurological: Negative for tingling and numbness.     Objective  Vitals:   12/11/16 1600  BP: 130/64  Pulse: 74  Resp: 15  Temp: 98.7 F (37.1 C)  TempSrc: Oral  SpO2: 97%  Weight: 231 lb 11.2 oz (105.1 kg)  Height: 6\' 1"  (1.854 m)    Physical Exam  Constitutional: He is well-developed, well-nourished, and in no distress.  Musculoskeletal:       Right knee: He exhibits swelling. He exhibits no erythema. No tenderness found.  Psychiatric: Mood, memory, affect and judgment normal.  Nursing note and vitals reviewed.       Assessment & Plan  1. Primary osteoarthritis of right knee Referral to orthopedics  for evaluation of pain and change in ambulation, patient has history of osteoarthritis and may need knee replacement - Ambulatory referral to Orthopedic Surgery   [name] [name] 12/11/2016 4:08 PM

## 2017-01-15 DIAGNOSIS — M25561 Pain in right knee: Secondary | ICD-10-CM | POA: Diagnosis not present

## 2017-01-15 DIAGNOSIS — M172 Bilateral post-traumatic osteoarthritis of knee: Secondary | ICD-10-CM | POA: Diagnosis not present

## 2017-03-03 ENCOUNTER — Ambulatory Visit: Payer: Commercial Managed Care - HMO

## 2017-03-03 ENCOUNTER — Ambulatory Visit: Payer: Commercial Managed Care - HMO | Admitting: Family Medicine

## 2017-03-11 DIAGNOSIS — L570 Actinic keratosis: Secondary | ICD-10-CM | POA: Diagnosis not present

## 2017-03-11 DIAGNOSIS — L720 Epidermal cyst: Secondary | ICD-10-CM | POA: Diagnosis not present

## 2017-03-11 DIAGNOSIS — L820 Inflamed seborrheic keratosis: Secondary | ICD-10-CM | POA: Diagnosis not present

## 2017-03-11 DIAGNOSIS — L821 Other seborrheic keratosis: Secondary | ICD-10-CM | POA: Diagnosis not present

## 2017-03-11 DIAGNOSIS — L578 Other skin changes due to chronic exposure to nonionizing radiation: Secondary | ICD-10-CM | POA: Diagnosis not present

## 2017-03-11 DIAGNOSIS — L718 Other rosacea: Secondary | ICD-10-CM | POA: Diagnosis not present

## 2017-03-11 DIAGNOSIS — Z85828 Personal history of other malignant neoplasm of skin: Secondary | ICD-10-CM | POA: Diagnosis not present

## 2017-03-20 ENCOUNTER — Other Ambulatory Visit: Payer: Self-pay | Admitting: Family Medicine

## 2017-04-07 ENCOUNTER — Ambulatory Visit (INDEPENDENT_AMBULATORY_CARE_PROVIDER_SITE_OTHER): Payer: Commercial Managed Care - HMO

## 2017-04-07 ENCOUNTER — Ambulatory Visit (INDEPENDENT_AMBULATORY_CARE_PROVIDER_SITE_OTHER): Payer: Commercial Managed Care - HMO | Admitting: Family Medicine

## 2017-04-07 VITALS — BP 116/74 | HR 64 | Temp 97.0°F | Ht 73.0 in | Wt 225.4 lb

## 2017-04-07 DIAGNOSIS — E785 Hyperlipidemia, unspecified: Secondary | ICD-10-CM | POA: Diagnosis not present

## 2017-04-07 DIAGNOSIS — Z0000 Encounter for general adult medical examination without abnormal findings: Secondary | ICD-10-CM | POA: Diagnosis not present

## 2017-04-07 DIAGNOSIS — Z1211 Encounter for screening for malignant neoplasm of colon: Secondary | ICD-10-CM | POA: Diagnosis not present

## 2017-04-07 DIAGNOSIS — R9431 Abnormal electrocardiogram [ECG] [EKG]: Secondary | ICD-10-CM

## 2017-04-07 DIAGNOSIS — E559 Vitamin D deficiency, unspecified: Secondary | ICD-10-CM | POA: Diagnosis not present

## 2017-04-07 DIAGNOSIS — N429 Disorder of prostate, unspecified: Secondary | ICD-10-CM | POA: Diagnosis not present

## 2017-04-07 NOTE — Progress Notes (Signed)
Name: Anthony Mosley   MRN: [id number]    DOB: Apr 17, 1935   Date:04/08/2017       Progress Note  Subjective  Chief Complaint  No chief complaint on file.   HPI  Pt. Presents for Medicare Physical part 2. He is doing well. He is due for screening colonoscopy/Cologuard, last one over 30 years ago.   Past Medical History:  Diagnosis Date  . Arthritis   . CAD (coronary artery disease)   . Cancer ([place])    SKIN  . Chronic knee pain   . Cough   . Edema    FEET/LEGS  . Hemorrhoids   . Hyperbilirubinemia   . PONV (postoperative nausea and vomiting)    after first cataract  . Rosacea   . Wheezing     Past Surgical History:  Procedure Laterality Date  . APPENDECTOMY    . CARDIAC CATHETERIZATION    . CATARACT EXTRACTION W/PHACO Right 10/24/2015   Procedure: CATARACT EXTRACTION PHACO AND INTRAOCULAR LENS PLACEMENT (IOC);  Surgeon: [name], MD;  Location: [facility];  Service: Ophthalmology;  Laterality: Right;  [place] 00:52 AP% 23.5 CDE 12.35 fluid pack lot # [id number]  . CATARACT EXTRACTION W/PHACO Left 11/14/2015   Procedure: CATARACT EXTRACTION PHACO AND INTRAOCULAR LENS PLACEMENT (IOC);  Surgeon: [name], MD;  Location: [facility];  Service: Ophthalmology;  Laterality: Left;  [place] 00:55 AP% 19.1 CDE 10.66 fluid pack lot # [id number]  . COLONOSCOPY  1987  . HERNIA REPAIR    . KNEE ARTHROSCOPY    . TONSILLECTOMY      Family History  Problem Relation Age of Onset  . Alzheimer's disease Mother   . Pulmonary embolism Father   . Diabetes Father   . Diabetes Brother     Social History   Social History  . Marital status: Married    Spouse name: N/A  . Number of children: N/A  . Years of education: N/A   Occupational History  . Not on file.   Social History Main Topics  . Smoking status: Former Smoker    Types: Cigarettes  . Smokeless tobacco: Former Systems developer    Types: Chew     Comment: quit > 30 yrs ago  . Alcohol use No  . Drug use: No  . Sexual  activity: No   Other Topics Concern  . Not on file   Social History Narrative  . No narrative on file     Current Outpatient Prescriptions:  .  aspirin 81 MG chewable tablet, Chew by mouth., Disp: , Rfl:  .  B COMPLEX VITAMINS PO, Take by mouth., Disp: , Rfl:  .  furosemide (LASIX) 20 MG tablet, Take 1 tablet (20 mg total) by mouth daily. (Patient not taking: Reported on 12/11/2016), Disp: 30 tablet, Rfl: 0 .  guaiFENesin (MUCINEX) 600 MG 12 hr tablet, Take by mouth 2 (two) times daily as needed., Disp: , Rfl:  .  simvastatin (ZOCOR) 40 MG tablet, TAKE 1 TABLET EVERY DAY  AT  6PM, Disp: 90 tablet, Rfl: 0  Allergies  Allergen Reactions  . Avodart  [Dutasteride] Itching  . Macrolides And Ketolides     Other reaction(s): Other (See Comments) Due to prolonged Q-T syndrome  . Norvasc  [Amlodipine]     bradycardia     Review of Systems  Constitutional: Negative for chills, fever and malaise/fatigue.  HENT: Positive for congestion (taking Mucinex for congestion.). Negative for ear pain and sore throat.   Eyes: Negative for blurred vision (  sees Ophthalmologist has been told he has dry eyes) and double vision.  Respiratory: Negative for cough, sputum production and shortness of breath.   Cardiovascular: Positive for leg swelling (ankle swelling). Negative for chest pain.  Gastrointestinal: Negative for blood in stool, constipation, diarrhea, nausea and vomiting.  Genitourinary: Negative for dysuria, frequency, hematuria and urgency.  Musculoskeletal: Negative for back pain and neck pain.  Neurological: Negative for dizziness and headaches.  Psychiatric/Behavioral: Negative for depression. The patient is not nervous/anxious and does not have insomnia.      Objective  There were no vitals filed for this visit.  Physical Exam  Constitutional: He is oriented to person, place, and time and well-developed, well-nourished, and in no distress.  HENT:  Head: Normocephalic and  atraumatic.  Right Ear: External ear normal.  Left Ear: External ear normal.  Mouth/Throat: Oropharynx is clear and moist.  Eyes: Conjunctivae are normal. Pupils are equal, round, and reactive to light.  Neck: Normal range of motion. Neck supple. No thyromegaly present.  Cardiovascular: Normal rate, regular rhythm and normal heart sounds.   No murmur heard. Pulmonary/Chest: Effort normal and breath sounds normal. He has no wheezes.  Abdominal: Soft. Bowel sounds are normal.  Genitourinary: Prostate normal and penis normal.  Musculoskeletal: He exhibits edema.  Neurological: He is alert and oriented to person, place, and time.  Psychiatric: Mood, memory, affect and judgment normal.  Nursing note and vitals reviewed.    Assessment & Plan  1. Medicare annual wellness visit, subsequent Obtain age-appropriate laboratory screenings - CBC with Differential/Platelet - COMPLETE METABOLIC PANEL WITH GFR - Lipid panel - TSH - VITAMIN D 25 Hydroxy (Vit-D Deficiency, Fractures) - PSA  2. Abnormal finding on EKG  EKG shows right bundle branch block, no previous EKG available for comparison on the patient has had catheterization in the past which showed no significant blockages per patient. He has no concerning symptoms suggestive of cardiac ischemia at this time. Advised to follow with cardiology - Ambulatory referral to Cardiology  3. Screening for colon cancer Colonoscopy was over 30 years ago, screening for colon cancer still indicated, we'll order cologuard - Cologuard   [name] [name] 04/08/2017 4:56 PM

## 2017-04-07 NOTE — Progress Notes (Signed)
Subjective:   Anthony Mosley is a 82 y.o. male who presents for an Initial Medicare Annual Wellness Visit.  Review of Systems  N/A  Cardiac Risk Factors include: advanced age (>37men, >67 women);dyslipidemia;male gender    Objective:    Today's Vitals   04/07/17 0943  BP: 116/74  Pulse: 64  Temp: 97 F (36.1 C)  TempSrc: Oral  Weight: 225 lb 6.4 oz (102.2 kg)  Height: 6\' 1"  (1.854 m)  PainSc: 0-No pain   Body mass index is 29.74 kg/m.  Current Medications (verified) Outpatient Encounter Prescriptions as of 04/07/2017  Medication Sig  . aspirin 81 MG chewable tablet Chew by mouth.  . B COMPLEX VITAMINS PO Take by mouth.  [name] guaiFENesin (MUCINEX) 600 MG 12 hr tablet Take by mouth 2 (two) times daily as needed.  . simvastatin (ZOCOR) 40 MG tablet TAKE 1 TABLET EVERY DAY  AT  6PM  . furosemide (LASIX) 20 MG tablet Take 1 tablet (20 mg total) by mouth daily. (Patient not taking: Reported on 12/11/2016)   No facility-administered encounter medications on file as of 04/07/2017.     Allergies (verified) Avodart  [dutasteride]; Macrolides and ketolides; and Norvasc  [amlodipine]   History: Past Medical History:  Diagnosis Date  . Arthritis   . CAD (coronary artery disease)   . Cancer ([place])    SKIN  . Chronic knee pain   . Cough   . Edema    FEET/LEGS  . Hemorrhoids   . Hyperbilirubinemia   . PONV (postoperative nausea and vomiting)    after first cataract  . Rosacea   . Wheezing    Past Surgical History:  Procedure Laterality Date  . APPENDECTOMY    . CARDIAC CATHETERIZATION    . CATARACT EXTRACTION W/PHACO Right 10/24/2015   Procedure: CATARACT EXTRACTION PHACO AND INTRAOCULAR LENS PLACEMENT (IOC);  Surgeon: [name], MD;  Location: [facility] ORS;  Service: Ophthalmology;  Laterality: Right;  [place] 00:52 AP% 23.5 CDE 12.35 fluid pack lot # [id number]  . CATARACT EXTRACTION W/PHACO Left 11/14/2015   Procedure: CATARACT EXTRACTION PHACO AND INTRAOCULAR LENS  PLACEMENT (IOC);  Surgeon: [name], MD;  Location: [facility] ORS;  Service: Ophthalmology;  Laterality: Left;  [place] 00:55 AP% 19.1 CDE 10.66 fluid pack lot # [id number]  . COLONOSCOPY  1987  . HERNIA REPAIR    . KNEE ARTHROSCOPY    . TONSILLECTOMY     Family History  Problem Relation Age of Onset  . Alzheimer's disease Mother   . Pulmonary embolism Father   . Diabetes Father   . Diabetes Brother    Social History   Occupational History  . Not on file.   Social History Main Topics  . Smoking status: Former Smoker    Types: Cigarettes  . Smokeless tobacco: Former Systems developer    Types: Chew     Comment: quit > 30 yrs ago  . Alcohol use No  . Drug use: No  . Sexual activity: No   Tobacco Counseling Counseling given: Not Answered   Activities of Daily Living In your present state of health, do you have any difficulty performing the following activities: 04/07/2017 12/11/2016  Hearing? Y N  Vision? N Y  Difficulty concentrating or making decisions? Y N  Walking or climbing stairs? N N  Dressing or bathing? N N  Doing errands, shopping? N N  Preparing Food and eating ? N -  Using the Toilet? N -  In the past six months, have you  accidently leaked urine? N -  Do you have problems with loss of bowel control? N -  Managing your Medications? N -  Managing your Finances? N -  Housekeeping or managing your Housekeeping? N -  Some recent data might be hidden    Immunizations and Health Maintenance Immunization History  Administered Date(s) Administered  . Influenza, High Dose Seasonal PF 08/23/2015, 09/16/2016  . Influenza, Seasonal, Injecte, Preservative Fre 08/27/2012  . Influenza,inj,Quad PF,36+ Mos 08/23/2013, 09/05/2014  . Pneumococcal Conjugate-13 11/17/2014  . Pneumococcal Polysaccharide-23 12/16/2008  . Zoster 06/13/2009   There are no preventive care reminders to display for this patient.  Patient Care Team: Roselee Nova, MD as PCP - General (Family  Medicine) Watt Climes, PA as Physician Assistant (Physician Assistant) [name], MD as Referring Physician (Ophthalmology) Brendolyn Patty, MD as Consulting Physician (Dermatology)  Indicate any recent Medical Services you may have received from other than [facility] providers in the past year (date may be approximate).    Assessment:   This is a routine wellness examination for [name].   Hearing/Vision screen Vision Screening Comments: Pt sees Dr George Ina for vision checks every 6 months.  Dietary issues and exercise activities discussed: Current Exercise Habits: The patient does not participate in regular exercise at present (yardwork, housework), Exercise limited by: None identified  Goals    . Increase water intake          Recommend increasing water intake to 4 glasses a day.      Depression Screen PHQ 2/9 Scores 04/07/2017 12/11/2016 09/04/2016 05/02/2016  PHQ - 2 Score 0 0 0 0    Fall Risk Fall Risk  04/07/2017 12/11/2016 09/04/2016 05/02/2016 10/04/2015  Falls in the past year? No No No No No    Cognitive Function:     6CIT Screen 04/07/2017  What Year? 0 points  What month? 0 points  What time? 0 points  Count back from 20 0 points  Months in reverse 4 points  Repeat phrase 2 points  Total Score 6    Screening Tests Health Maintenance  Topic Date Due  . TETANUS/TDAP  12/16/2026 (Originally 11/28/1954)  . INFLUENZA VACCINE  07/16/2017  . PNA vac Low Risk Adult  Completed        Plan:  I have personally reviewed and addressed the Medicare Annual Wellness questionnaire and have noted the following in the patient's chart:  A. Medical and social history B. Use of alcohol, tobacco or illicit drugs  C. Current medications and supplements D. Functional ability and status E.  Nutritional status F.  Physical activity G. Advance directives H. List of other physicians I.  Hospitalizations, surgeries, and ER visits in previous 12 months J.  [place]  such as hearing and vision if needed, cognitive and depression L. Referrals and appointments - none  In addition, I have reviewed and discussed with patient certain preventive protocols, quality metrics, and best practice recommendations. A written personalized care plan for preventive services as well as general preventive health recommendations were provided to patient.  See attached scanned questionnaire for additional information.   Signed,  Fabio Neighbors, LPN Nurse Health Advisor   MD Recommendations: None. Pt declined tetanus vaccine today.   I, as supervising physician, have reviewed the nurse health advisor's Medicare Wellness Visit note for this patient and concur with the findings and recommendations listed above.  Signed Syed [name]. Manuella Ghazi MD Attending Physician.

## 2017-04-07 NOTE — Patient Instructions (Signed)
Anthony Mosley , Thank you for taking time to come for your Medicare Wellness Visit. I appreciate your ongoing commitment to your health goals. Please review the following plan we discussed and let me know if I can assist you in the future.   Screening recommendations/referrals: Colonoscopy: N/A Recommended yearly ophthalmology/optometry visit for glaucoma screening and checkup Recommended yearly dental visit for hygiene and checkup  Vaccinations: Influenza vaccine: done 09/16/16 Pneumococcal vaccine: completed series Tdap vaccine: declined    Advanced directives: declined  Next appointment: None  Preventive Care 81 Years and Older, Male Preventive care refers to lifestyle choices and visits with your health care provider that can promote health and wellness. What does preventive care include?  A yearly physical exam. This is also called an annual well check.  Dental exams once or twice a year.  Routine eye exams. Ask your health care provider how often you should have your eyes checked.  Personal lifestyle choices, including:  Daily care of your teeth and gums.  Regular physical activity.  Eating a healthy diet.  Avoiding tobacco and drug use.  Limiting alcohol use.  Practicing safe sex.  Taking low doses of aspirin every day.  Taking vitamin and mineral supplements as recommended by your health care provider. What happens during an annual well check? The services and screenings done by your health care provider during your annual well check will depend on your age, overall health, lifestyle risk factors, and family history of disease. Counseling  Your health care provider may ask you questions about your:  Alcohol use.  Tobacco use.  Drug use.  Emotional well-being.  Home and relationship well-being.  Sexual activity.  Eating habits.  History of falls.  Memory and ability to understand (cognition).  Work and work Statistician. Screening  You may have  the following tests or measurements:  Height, weight, and BMI.  Blood pressure.  Lipid and cholesterol levels. These may be checked every 5 years, or more frequently if you are over 82 years old.  Skin check.  Lung cancer screening. You may have this screening every year starting at age 47 if you have a 30-pack-year history of smoking and currently smoke or have quit within the past 15 years.  Fecal occult blood test (FOBT) of the stool. You may have this test every year starting at age 78.  Flexible sigmoidoscopy or colonoscopy. You may have a sigmoidoscopy every 5 years or a colonoscopy every 10 years starting at age 28.  Prostate cancer screening. Recommendations will vary depending on your family history and other risks.  Hepatitis C blood test.  Hepatitis B blood test.  Sexually transmitted disease (STD) testing.  Diabetes screening. This is done by checking your blood sugar (glucose) after you have not eaten for a while (fasting). You may have this done every 1-3 years.  Abdominal aortic aneurysm (AAA) screening. You may need this if you are a current or former smoker.  Osteoporosis. You may be screened starting at age 51 if you are at high risk. Talk with your health care provider about your test results, treatment options, and if necessary, the need for more tests. Vaccines  Your health care provider may recommend certain vaccines, such as:  Influenza vaccine. This is recommended every year.  Tetanus, diphtheria, and acellular pertussis (Tdap, Td) vaccine. You may need a Td booster every 10 years.  Zoster vaccine. You may need this after age 14.  Pneumococcal 13-valent conjugate (PCV13) vaccine. One dose is recommended after age 60.  Pneumococcal polysaccharide (PPSV23) vaccine. One dose is recommended after age 77. Talk to your health care provider about which screenings and vaccines you need and how often you need them. This information is not intended to replace  advice given to you by your health care provider. Make sure you discuss any questions you have with your health care provider. Document Released: 12/29/2015 Document Revised: 08/21/2016 Document Reviewed: 10/03/2015 Elsevier Interactive Patient Education  2017 [place] Prevention in the Home Falls can cause injuries. They can happen to people of all ages. There are many things you can do to make your home safe and to help prevent falls. What can I do on the outside of my home?  Regularly fix the edges of walkways and driveways and fix any cracks.  Remove anything that might make you trip as you walk through a door, such as a raised step or threshold.  Trim any bushes or trees on the path to your home.  Use bright outdoor lighting.  Clear any walking paths of anything that might make someone trip, such as rocks or tools.  Regularly check to see if handrails are loose or broken. Make sure that both sides of any steps have handrails.  Any raised decks and porches should have guardrails on the edges.  Have any leaves, snow, or ice cleared regularly.  Use sand or salt on walking paths during winter.  Clean up any spills in your garage right away. This includes oil or grease spills. What can I do in the bathroom?  Use night lights.  Install grab bars by the toilet and in the tub and shower. Do not use towel bars as grab bars.  Use non-skid mats or decals in the tub or shower.  If you need to sit down in the shower, use a plastic, non-slip stool.  Keep the floor dry. Clean up any water that spills on the floor as soon as it happens.  Remove soap buildup in the tub or shower regularly.  Attach bath mats securely with double-sided non-slip rug tape.  Do not have throw rugs and other things on the floor that can make you trip. What can I do in the bedroom?  Use night lights.  Make sure that you have a light by your bed that is easy to reach.  Do not use any sheets  or blankets that are too big for your bed. They should not hang down onto the floor.  Have a firm chair that has side arms. You can use this for support while you get dressed.  Do not have throw rugs and other things on the floor that can make you trip. What can I do in the kitchen?  Clean up any spills right away.  Avoid walking on wet floors.  Keep items that you use a lot in easy-to-reach places.  If you need to reach something above you, use a strong step stool that has a grab bar.  Keep electrical cords out of the way.  Do not use floor polish or wax that makes floors slippery. If you must use wax, use non-skid floor wax.  Do not have throw rugs and other things on the floor that can make you trip. What can I do with my stairs?  Do not leave any items on the stairs.  Make sure that there are handrails on both sides of the stairs and use them. Fix handrails that are broken or loose. Make sure that handrails are as  long as the stairways.  Check any carpeting to make sure that it is firmly attached to the stairs. Fix any carpet that is loose or worn.  Avoid having throw rugs at the top or bottom of the stairs. If you do have throw rugs, attach them to the floor with carpet tape.  Make sure that you have a light switch at the top of the stairs and the bottom of the stairs. If you do not have them, ask someone to add them for you. What else can I do to help prevent falls?  Wear shoes that:  Do not have high heels.  Have rubber bottoms.  Are comfortable and fit you well.  Are closed at the toe. Do not wear sandals.  If you use a stepladder:  Make sure that it is fully opened. Do not climb a closed stepladder.  Make sure that both sides of the stepladder are locked into place.  Ask someone to hold it for you, if possible.  Clearly mark and make sure that you can see:  Any grab bars or handrails.  First and last steps.  Where the edge of each step is.  Use  tools that help you move around (mobility aids) if they are needed. These include:  Canes.  Walkers.  Scooters.  Crutches.  Turn on the lights when you go into a dark area. Replace any light bulbs as soon as they burn out.  Set up your furniture so you have a clear path. Avoid moving your furniture around.  If any of your floors are uneven, fix them.  If there are any pets around you, be aware of where they are.  Review your medicines with your doctor. Some medicines can make you feel dizzy. This can increase your chance of falling. Ask your doctor what other things that you can do to help prevent falls. This information is not intended to replace advice given to you by your health care provider. Make sure you discuss any questions you have with your health care provider. Document Released: 09/28/2009 Document Revised: 05/09/2016 Document Reviewed: 01/06/2015 Elsevier Interactive Patient Education  2017 [employer].

## 2017-04-08 LAB — CBC WITH DIFFERENTIAL/PLATELET
BASOS ABS: 66 {cells}/uL (ref 0–200)
Basophils Relative: 1 %
EOS PCT: 3 %
Eosinophils Absolute: 198 cells/uL (ref 15–500)
HCT: 42.5 % (ref 38.5–50.0)
Hemoglobin: 14.1 g/dL (ref 13.2–17.1)
LYMPHS PCT: 24 %
Lymphs Abs: 1584 cells/uL (ref 850–3900)
MCH: 30.1 pg (ref 27.0–33.0)
MCHC: 33.2 g/dL (ref 32.0–36.0)
MCV: 90.8 fL (ref 80.0–100.0)
MONOS PCT: 7 %
MPV: 10 fL (ref 7.5–12.5)
Monocytes Absolute: 462 cells/uL (ref 200–950)
Neutro Abs: 4290 cells/uL (ref 1500–7800)
Neutrophils Relative %: 65 %
PLATELETS: 154 10*3/uL (ref 140–400)
RBC: 4.68 MIL/uL (ref 4.20–5.80)
RDW: 14.3 % (ref 11.0–15.0)
WBC: 6.6 10*3/uL (ref 3.8–10.8)

## 2017-04-08 LAB — COMPLETE METABOLIC PANEL WITH GFR
ALT: 10 U/L (ref 9–46)
AST: 17 U/L (ref 10–35)
Albumin: 3.9 g/dL (ref 3.6–5.1)
Alkaline Phosphatase: 51 U/L (ref 40–115)
BUN: 17 mg/dL (ref 7–25)
CHLORIDE: 103 mmol/L (ref 98–110)
CO2: 26 mmol/L (ref 20–31)
Calcium: 9.2 mg/dL (ref 8.6–10.3)
Creat: 1.22 mg/dL — ABNORMAL HIGH (ref 0.70–1.18)
GFR, EST NON AFRICAN AMERICAN: 56 mL/min — AB (ref >=60)
GFR, Est African American: 65 mL/min (ref >=60)
GLUCOSE: 99 mg/dL (ref 65–99)
POTASSIUM: 4.3 mmol/L (ref 3.5–5.3)
SODIUM: 141 mmol/L (ref 135–146)
TOTAL PROTEIN: 6.4 g/dL (ref 6.1–8.1)
Total Bilirubin: 1.7 mg/dL — ABNORMAL HIGH (ref 0.2–1.2)

## 2017-04-08 LAB — LIPID PANEL
CHOL/HDL RATIO: 2.7 ratio (ref <5.0)
Cholesterol: 131 mg/dL (ref <200)
HDL: 49 mg/dL (ref >40)
LDL CALC: 67 mg/dL (ref <100)
TRIGLYCERIDES: 73 mg/dL (ref <150)
VLDL: 15 mg/dL (ref <30)

## 2017-04-08 LAB — PSA: PSA: 2.6 ng/mL (ref <=4.0)

## 2017-04-08 LAB — TSH: TSH: 3.13 m[IU]/L (ref 0.40–4.50)

## 2017-04-09 LAB — VITAMIN D 25 HYDROXY (VIT D DEFICIENCY, FRACTURES): Vit D, 25-Hydroxy: 17 ng/mL — ABNORMAL LOW (ref 30–100)

## 2017-04-14 DIAGNOSIS — Z1212 Encounter for screening for malignant neoplasm of rectum: Secondary | ICD-10-CM | POA: Diagnosis not present

## 2017-04-14 DIAGNOSIS — Z1211 Encounter for screening for malignant neoplasm of colon: Secondary | ICD-10-CM | POA: Diagnosis not present

## 2017-04-15 ENCOUNTER — Telehealth: Payer: Self-pay

## 2017-04-15 ENCOUNTER — Other Ambulatory Visit: Payer: Self-pay | Admitting: Family Medicine

## 2017-04-15 LAB — COLOGUARD: Cologuard: POSITIVE

## 2017-04-15 MED ORDER — VITAMIN D (ERGOCALCIFEROL) 1.25 MG (50000 UNIT) PO CAPS: 50000.0000 [IU] | ORAL_CAPSULE | ORAL | 0 refills | Status: DC

## 2017-04-15 NOTE — Telephone Encounter (Signed)
Patient has been notified of lab results and a prescription for Vitamin D3 50,000 units take 1 capsule once a week x12 weeks has been sent to [facility] order per Dr. Manuella Ghazi, patient has been notified

## 2017-04-17 ENCOUNTER — Encounter: Payer: Self-pay | Admitting: Gastroenterology

## 2017-04-23 ENCOUNTER — Encounter: Payer: Self-pay | Admitting: Internal Medicine

## 2017-04-23 ENCOUNTER — Ambulatory Visit (INDEPENDENT_AMBULATORY_CARE_PROVIDER_SITE_OTHER): Payer: Commercial Managed Care - HMO | Admitting: Internal Medicine

## 2017-04-23 VITALS — BP 122/64 | HR 62 | Ht 73.0 in | Wt 222.2 lb

## 2017-04-23 DIAGNOSIS — R0609 Other forms of dyspnea: Secondary | ICD-10-CM

## 2017-04-23 DIAGNOSIS — R9431 Abnormal electrocardiogram [ECG] [EKG]: Secondary | ICD-10-CM | POA: Diagnosis not present

## 2017-04-23 NOTE — Patient Instructions (Signed)
Medication Instructions:  Your physician recommends that you continue on your current medications as directed. Please refer to the Current Medication list given to you today.   Labwork: none  Testing/Procedures:  Your physician has requested that you have an echocardiogram. Echocardiography is a painless test that uses sound waves to create images of your heart. It provides your doctor with information about the size and shape of your heart and how well your heart's chambers and valves are working. This procedure takes approximately one hour. There are no restrictions for this procedure.    Follow-Up: Your physician recommends that you schedule a follow-up appointment in: 2 MONTHS WITH DR END.    Echocardiogram An echocardiogram, or echocardiography, uses sound waves (ultrasound) to produce an image of your heart. The echocardiogram is simple, painless, obtained within a short period of time, and offers valuable information to your health care provider. The images from an echocardiogram can provide information such as:  Evidence of coronary artery disease (CAD).  Heart size.  Heart muscle function.  Heart valve function.  Aneurysm detection.  Evidence of a past heart attack.  Fluid buildup around the heart.  Heart muscle thickening.  Assess heart valve function. Tell a health care provider about:  Any allergies you have.  All medicines you are taking, including vitamins, herbs, eye drops, creams, and over-the-counter medicines.  Any problems you or family members have had with anesthetic medicines.  Any blood disorders you have.  Any surgeries you have had.  Any medical conditions you have.  Whether you are pregnant or may be pregnant. What happens before the procedure? No special preparation is needed. Eat and drink normally. What happens during the procedure?  In order to produce an image of your heart, gel will be applied to your chest and a wand-like tool  (transducer) will be moved over your chest. The gel will help transmit the sound waves from the transducer. The sound waves will harmlessly bounce off your heart to allow the heart images to be captured in real-time motion. These images will then be recorded.  You may need an IV to receive a medicine that improves the quality of the pictures. What happens after the procedure? You may return to your normal schedule including diet, activities, and medicines, unless your health care provider tells you otherwise. This information is not intended to replace advice given to you by your health care provider. Make sure you discuss any questions you have with your health care provider. Document Released: 11/29/2000 Document Revised: 07/20/2016 Document Reviewed: 08/09/2013 Elsevier Interactive Patient Education  2017 [employer].

## 2017-04-23 NOTE — Progress Notes (Signed)
New Outpatient Visit Date: 04/23/2017  Referring Provider: Roselee Nova, MD [address] [place], [place] [address]  Chief Complaint: Abnormal EKG  HPI:  Anthony Mosley is a 82 y.o. male who is being seen today for the evaluation of abnormal EKG at the request of Dr. Manuella Ghazi. He has a history of nonobstructive coronary artery disease. He reports feeling well recently evaluated by Dr. Manuella Ghazi for routine physical. EKG demonstrated right bundle branch block, prompting referral to [place]. Mr. Clos denies chest pain but has experienced increased exertional dyspnea when walking uphill this year. It has been present in the past, but seems more pronounced over the last few months. He denies dyspnea at rest. He notes bilateral ankle swelling, left greater than right, that has been well-controlled with fluid pills. He denies claudication but experiences leg cramps at night from time to time. He denies palpitations, lightheadedness, orthopnea, and PND.  Prior cardiac workup including cardiac catheterization as recently as 07/2004 revealing luminal irregularities involving the LAD and RCA. Myocardial perfusion stress test in 2004 was normal. 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  --------------------------------------------------------------------------------------------------  Cardiovascular History & Procedures: Cardiovascular Problems:  Abnormal EKG  Nonobstructive coronary artery disease  Risk Factors:  Known coronary artery disease, male gender, and age greater than 22  Cath/PCI:  LHC (07/20/04): LMCA normal. LAD with mild luminal irregularities. LCx without significant disease. Dominant RCA with luminal irregularities and ectasia and accompanying sluggish flow. No fixed obstruction. LVEF 55%.  LHC (01/29/01): LMCA normal. 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 LCx normal. RCA with sequential 60% and 40% proximal and mid vessel stenoses. LVEF 65% with mild inferior hypokinesis.  CV Surgery:  None  EP Procedures and Devices:  None  Non-Invasive Evaluation(s):  Exercise MPI (09/01/03): Normal study without ischemia or scar. LVEF 54%.  Recent CV Pertinent Labs: Lab Results  Component Value Date   CHOL 131 04/07/2017   CHOL 143 05/03/2016   HDL 49 04/07/2017   HDL 49 05/03/2016   LDLCALC 67 04/07/2017   LDLCALC 78 05/03/2016   TRIG 73 04/07/2017   CHOLHDL 2.7 04/07/2017   K 4.3 04/07/2017   BUN 17 04/07/2017   BUN 16 04/03/2016   CREATININE 1.22 (H) 04/07/2017    --------------------------------------------------------------------------------------------------  Past Medical History:  Diagnosis Date  . Arthritis   . CAD (coronary artery disease)   . Cancer ([place])    SKIN  . Chronic knee pain   . Cough   . Edema    FEET/LEGS  . Hemorrhoids   . Hyperbilirubinemia   . Hyperlipidemia   . PONV (postoperative nausea and vomiting)    after first cataract  . Rosacea   . Wheezing     Past Surgical History:  Procedure Laterality Date  . APPENDECTOMY    . CARDIAC CATHETERIZATION  2005  . CARDIAC CATHETERIZATION  2002  . CATARACT EXTRACTION W/PHACO Right 10/24/2015   Procedure: CATARACT EXTRACTION PHACO AND INTRAOCULAR LENS PLACEMENT (IOC);  Surgeon: Anthony Robson, MD;  Location: [facility] ORS;  Service: Ophthalmology;  Laterality: Right;  [place] 00:52 AP% 23.5 CDE 12.35 fluid pack lot # [id number]  . CATARACT EXTRACTION W/PHACO Left 11/14/2015   Procedure: CATARACT EXTRACTION PHACO AND INTRAOCULAR LENS PLACEMENT (IOC);  Surgeon: Anthony Robson, MD;  Location: [facility] ORS;  Service: Ophthalmology;  Laterality: Left;  [place] 00:55 AP%  19.1 CDE 10.66 fluid pack lot # [id number] Anthony Mosley  . HERNIA REPAIR    . KNEE ARTHROSCOPY    . TONSILLECTOMY      Outpatient Encounter Prescriptions as  of 04/23/2017  Medication Sig  . aspirin 81 MG chewable tablet Chew by mouth.  . B COMPLEX VITAMINS PO Take by mouth.  [name] guaiFENesin (MUCINEX) 600 MG 12 hr tablet Take by mouth 2 (two) times daily as needed.  . simvastatin (ZOCOR) 40 MG tablet TAKE 1 TABLET EVERY DAY  AT  6PM  . Vitamin D, Ergocalciferol, (DRISDOL) 50000 units CAPS capsule Take 1 capsule (50,000 Units total) by mouth once a week. For 12 weeks  . [DISCONTINUED] furosemide (LASIX) 20 MG tablet Take 1 tablet (20 mg total) by mouth daily. (Patient not taking: Reported on 04/23/2017)   No facility-administered encounter medications on file as of 04/23/2017.     Allergies: Avodart  [dutasteride]; Macrolides and ketolides; and Norvasc  [amlodipine]  Social History   Social History  . Marital status: Married    Spouse name: N/A  . Number of children: N/A  . Years of education: N/A   Occupational History  . Not on file.   Social History Main Topics  . Smoking status: Former Smoker    Types: Cigarettes  . Smokeless tobacco: Former Systems developer    Types: Chew     Comment: quit > 30 yrs ago  . Alcohol use No  . Drug use: No  . Sexual activity: No   Other Topics Concern  . Not on file   Social History Narrative  . No narrative on file    Family History  Problem Relation Age of Onset  . Alzheimer's disease Mother   . Pulmonary embolism Father   . Diabetes Father   . Heart attack Father 36  . Diabetes Brother   . Heart disease Brother        CABG  . Heart disease Brother        CABG    Review of Systems: A 12-system review of systems was performed and was negative except as noted in the HPI.  --------------------------------------------------------------------------------------------------  Physical Exam: BP 122/64 (BP Location: Right Arm, Patient Position: Sitting, Cuff Size: Normal)   Pulse 62   Ht 6\' 1"  (1.854 m)   Wt 222 lb 4 oz (100.8 kg)   BMI 29.32 kg/m   General:  Overweight, elderly man seated  comfortably in the exam room. HEENT: No conjunctival pallor or scleral icterus.  Moist mucous membranes.  OP clear. Neck: Supple without lymphadenopathy, thyromegaly, JVD, or HJR.  No carotid bruit. Lungs: Normal work of breathing.  Clear to auscultation bilaterally without wheezes or crackles. Heart: Regular rate and rhythm without murmurs, rubs, or gallops.  Non-displaced PMI. Abd: Bowel sounds present.  Soft, NT/ND without hepatosplenomegaly Ext: No lower extremity edema.  Radial, PT, and DP pulses are 2+ bilaterally Skin: warm and dry without rash Neuro: CNIII-XII intact.  Strength and fine-touch sensation intact in upper and lower extremities bilaterally. Psych: Normal mood and affect.  EKG:  Sinus rhythm with isolated PAC. Left axis deviation with nonspecific intraventricular conduction delay.   Lab Results  Component Value Date   WBC 6.6 04/07/2017   HGB 14.1 04/07/2017   HCT 42.5 04/07/2017   MCV 90.8 04/07/2017   PLT 154 04/07/2017    Lab Results  Component Value Date   NA 141 04/07/2017   K 4.3 04/07/2017   CL 103 04/07/2017  CO2 26 04/07/2017   BUN 17 04/07/2017   CREATININE 1.22 (H) 04/07/2017   GLUCOSE 99 04/07/2017   ALT 10 04/07/2017    Lab Results  Component Value Date   CHOL 131 04/07/2017   HDL 49 04/07/2017   LDLCALC 67 04/07/2017   TRIG 73 04/07/2017   CHOLHDL 2.7 04/07/2017    --------------------------------------------------------------------------------------------------  ASSESSMENT AND PLAN: Dyspnea on exertion and abnormal EKG QRS modestly widened on EKG today with nonspecific intraventricular conduction delay and left axis deviation. No obvious ischemic changes evident. The patient's only symptom is exertional dyspnea, which is slightly worse this year than last year. He does not have any symptoms to suggest high-grade heart block. We have agreed to begin with a transthoracic echocardiogram. Based on these results, we will then decide whether  we should proceed with myocardial perfusion stress testing versus cardiac catheterization to exclude underlying ischemia. We will not make any medication changes at this time.  Follow-up: Return to clinic in 2 months.  [name], MD 04/24/2017 9:35 PM

## 2017-04-24 ENCOUNTER — Encounter: Payer: Self-pay | Admitting: Internal Medicine

## 2017-04-24 DIAGNOSIS — R9431 Abnormal electrocardiogram [ECG] [EKG]: Secondary | ICD-10-CM | POA: Insufficient documentation

## 2017-04-24 DIAGNOSIS — R0609 Other forms of dyspnea: Secondary | ICD-10-CM | POA: Insufficient documentation

## 2017-05-19 ENCOUNTER — Encounter: Payer: Self-pay | Admitting: Gastroenterology

## 2017-05-19 ENCOUNTER — Other Ambulatory Visit: Payer: Self-pay

## 2017-05-19 ENCOUNTER — Ambulatory Visit (INDEPENDENT_AMBULATORY_CARE_PROVIDER_SITE_OTHER): Payer: Commercial Managed Care - HMO | Admitting: Gastroenterology

## 2017-05-19 VITALS — BP 122/73 | HR 61 | Temp 97.6°F | Ht 73.0 in | Wt 223.0 lb

## 2017-05-19 DIAGNOSIS — R7989 Other specified abnormal findings of blood chemistry: Secondary | ICD-10-CM

## 2017-05-19 NOTE — Progress Notes (Signed)
Gastroenterology Consultation  Referring Provider:     Roselee Nova, MD Primary Care Physician:  Roselee Nova, MD Primary Gastroenterologist:  Dr. Allen Norris     Reason for Consultation:     Hyperbilirubinemia        HPI:   Anthony Mosley is a 82 y.o. y/o male referred for consultation & management of Hyperbilirubinemia by Dr. Manuella Ghazi, Talbert Forest, MD.  This patient was sent to me for elevated bilirubin.  The patient's total bilirubin has been as high as 1.7 and has been chronically high.  The patient reports that he always has his blood work done on fasting.  The patient's other liver enzymes have all remained normal.  There is no report of any abdominal pain fevers chills nausea or vomiting.  The patient also denies any change in his stool colors or a change in his skin color.  He also denies any dark urine.  The patient has not had a history of alcohol abuse.  The patient also states that he had a colonoscopy many years ago but is waiting the results of a Cologard test.  Past Medical History:  Diagnosis Date  . Arthritis   . CAD (coronary artery disease)   . Cancer ([place])    SKIN  . Chronic knee pain   . Cough   . Edema    FEET/LEGS  . Hemorrhoids   . Hyperbilirubinemia   . Hyperlipidemia   . PONV (postoperative nausea and vomiting)    after first cataract  . Rosacea   . Wheezing     Past Surgical History:  Procedure Laterality Date  . APPENDECTOMY    . CARDIAC CATHETERIZATION  2005  . CARDIAC CATHETERIZATION  2002  . CATARACT EXTRACTION W/PHACO Right 10/24/2015   Procedure: CATARACT EXTRACTION PHACO AND INTRAOCULAR LENS PLACEMENT (IOC);  Surgeon: [name], MD;  Location: [facility] ORS;  Service: Ophthalmology;  Laterality: Right;  [place] 00:52 AP% 23.5 CDE 12.35 fluid pack lot # [id number]  . CATARACT EXTRACTION W/PHACO Left 11/14/2015   Procedure: CATARACT EXTRACTION PHACO AND INTRAOCULAR LENS PLACEMENT (IOC);  Surgeon: [name], MD;  Location: [facility] ORS;   Service: Ophthalmology;  Laterality: Left;  [place] 00:55 AP% 19.1 CDE 10.66 fluid pack lot # [id number]  . COLONOSCOPY  1987  . HERNIA REPAIR    . KNEE ARTHROSCOPY    . TONSILLECTOMY      Prior to Admission medications   Medication Sig Start Date End Date Taking? Authorizing Provider  aspirin 81 MG chewable tablet Chew by mouth.   Yes [provider]  B COMPLEX VITAMINS PO Take by mouth.   Yes [provider]  simvastatin (ZOCOR) 40 MG tablet TAKE 1 TABLET EVERY DAY  AT  6PM 03/20/17  Yes Roselee Nova, MD  furosemide (LASIX) 20 MG tablet furosemide 20 mg tablet    [provider]  guaiFENesin (MUCINEX) 600 MG 12 hr tablet Take by mouth 2 (two) times daily as needed.    [provider]  meloxicam (MOBIC) 15 MG tablet meloxicam 15 mg tablet  Take 1 tablet every day by oral route with meals.    [provider]  Vitamin D, Ergocalciferol, (DRISDOL) 50000 units CAPS capsule Take 1 capsule (50,000 Units total) by mouth once a week. For 12 weeks 04/15/17   Roselee Nova, MD    Family History  Problem Relation Age of Onset  . Alzheimer's disease Mother   . Pulmonary  embolism Father   . Diabetes Father   . Heart attack Father 22  . Diabetes Brother   . Heart disease Brother        CABG  . Heart disease Brother        CABG     Social History  Substance Use Topics  . Smoking status: Former Smoker    Types: Cigarettes  . Smokeless tobacco: Former Systems developer    Types: Chew     Comment: quit > 30 yrs ago  . Alcohol use No    Allergies as of 05/19/2017 - Review Complete 04/24/2017  Allergen Reaction Noted  . Avodart  [dutasteride] Itching 10/04/2015  . Macrolides and ketolides Other (See Comments) 03/14/2015  . Norvasc  [amlodipine]  10/04/2015    Review of Systems:    All systems reviewed and negative except where noted in HPI.   Physical Exam:  BP 122/73   Pulse 61   Temp 97.6 F (36.4 C) (Oral)   Ht 6\' 1"  (1.854 m)   Wt 223 lb  (101.2 kg)   BMI 29.42 kg/m  No LMP for male patient. Psych:  Alert and cooperative. Normal mood and affect. General:   Alert,  Well-developed, well-nourished, pleasant and cooperative in NAD Head:  Normocephalic and atraumatic. Eyes:  Sclera clear, no icterus.   Conjunctiva pink. Ears:  Normal auditory acuity. Nose:  No deformity, discharge, or lesions. Mouth:  No deformity or lesions,oropharynx pink & moist. Neck:  Supple; no masses or thyromegaly. Lungs:  Respirations even and unlabored.  Clear throughout to auscultation.   No wheezes, crackles, or rhonchi. No acute distress. Heart:  Regular rate and rhythm; no murmurs, clicks, rubs, or gallops. Abdomen:  Normal bowel sounds.  No bruits.  Soft, non-tender and non-distended without masses, hepatosplenomegaly or hernias noted.  No guarding or rebound tenderness.  Negative Carnett sign.   Rectal:  Deferred.  Msk:  Symmetrical without gross deformities.  Good, equal movement & strength bilaterally. Pulses:  Normal pulses noted. Extremities:  No clubbing or edema.  No cyanosis. Neurologic:  Alert and oriented x3;  grossly normal neurologically. Skin:  Intact without significant lesions or rashes.  No jaundice. Lymph Nodes:  No significant cervical adenopathy. Psych:  Alert and cooperative. Normal mood and affect.  Imaging Studies: No results found.  Assessment and Plan:   Anthony Mosley is a 82 y.o. y/o male This had chronically elevated bilirubin wall fasting.  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  The patient will have his blood sent off for fractionation of the bilirubin to see if the elevation is from direct or indirect bilirubin.  The patient has been explained the plan and will be notified with the results.  [name], MD. Marval Regal   Note: This dictation was prepared with Dragon dictation  along with smaller phrase technology. Any transcriptional errors that result from this process are unintentional.

## 2017-05-20 LAB — BILIRUBIN, FRACTIONATED(TOT/DIR/INDIR)
BILIRUBIN INDIRECT: 0.86 mg/dL — AB (ref 0.10–0.80)
BILIRUBIN TOTAL: 1.1 mg/dL (ref 0.0–1.2)
Bilirubin, Direct: 0.24 mg/dL (ref 0.00–0.40)

## 2017-05-23 ENCOUNTER — Telehealth: Payer: Self-pay

## 2017-05-23 NOTE — Telephone Encounter (Signed)
-----   Message from [name], MD sent at 05/20/2017  7:59 AM EDT ----- Anthony Mosley, Let the patient know that his bilirubin was normal when he was not fasting and the majority of his bilirubin was indirect consistent with this being Gilbert's syndrome. I am CC'ing a copy of this to his PCP

## 2017-05-23 NOTE — Telephone Encounter (Signed)
Tried contacting pt but no vm available to leave message. 

## 2017-05-29 ENCOUNTER — Ambulatory Visit (INDEPENDENT_AMBULATORY_CARE_PROVIDER_SITE_OTHER): Payer: Medicare HMO

## 2017-05-29 ENCOUNTER — Other Ambulatory Visit: Payer: Self-pay

## 2017-05-29 DIAGNOSIS — R9431 Abnormal electrocardiogram [ECG] [EKG]: Secondary | ICD-10-CM | POA: Diagnosis not present

## 2017-05-29 DIAGNOSIS — R0609 Other forms of dyspnea: Secondary | ICD-10-CM | POA: Diagnosis not present

## 2017-05-29 NOTE — Telephone Encounter (Signed)
Called pt again.  Was able to leave a vm for pt to return my call.

## 2017-05-30 NOTE — Telephone Encounter (Signed)
Pt notified of lab results

## 2017-05-30 NOTE — Telephone Encounter (Signed)
Patient left a voice message that he wants you to call him at 3:30 TODAY with results. He can't sit at home all day and wait on you. Please call at this time.

## 2017-06-02 ENCOUNTER — Telehealth: Payer: Self-pay | Admitting: Internal Medicine

## 2017-06-02 ENCOUNTER — Telehealth: Payer: Self-pay

## 2017-06-02 DIAGNOSIS — R0609 Other forms of dyspnea: Principal | ICD-10-CM

## 2017-06-02 DIAGNOSIS — Z01818 Encounter for other preprocedural examination: Secondary | ICD-10-CM

## 2017-06-02 NOTE — Telephone Encounter (Signed)
-----   Message from [name], MD sent at 05/20/2017  7:59 AM EDT ----- Amarra Sawyer, Let the patient know that his bilirubin was normal when he was not fasting and the majority of his bilirubin was indirect consistent with this being Gilbert's syndrome. I am CC'ing a copy of this to his PCP

## 2017-06-02 NOTE — Telephone Encounter (Signed)
Pt notified of lab results

## 2017-06-02 NOTE — Telephone Encounter (Signed)
Left and right heart cath scheduled at [facility] on 06/13/17 at 0730 with arrival time of 0530 am. Patient notified of procedure date, time and location and the following preoperative instructions as follows. Patient will go to [facility] for pre-op lab and chest xray later this week. Patient verbalized understanding of all instructions:  You are scheduled for a LEFT and RIGHT Cardiac Catheterization on 06/13/17 with Dr. Saunders Revel.  Please arrive at the [facility] at 05:30 AM on the day of your procedure.  1. Nothing to eat or drink after midnight. 2. You should take your medication as usual with a sip of water; this includes your aspirin. 3. If you are diabetic, do NOT take your diabetic medication(s) or insulin the morning of the procedure.  These will be given to you after the procedure. 4. If you are taking any medication with Glucophage (Metformin) in it, do NOT take your dose the day before or the day of your procedure.  You will be instructed when to re-start your medication. 5. You will need bloodwork and a chest xray prior to the procedure.  You do not have to be fasting.  6. Plan for one night stay - bring personal belongings (i.e. Toothpaste, toothbrush, etc.). 7. Bring a current list of your medications and current insurance cards. 8. Must have a responsible person to drive you home. 9. Someone must be with you for the first 24 hours after you arrive home. 10. Please wear clothes that are easy to get on and off and wear slip-on shoes.  * Special note:  Every effort is made to have your procedure done on time.  Occasionally there are emergencies that present themselves at the hospital that may cause delays.  Please be patient if a delay does occur.  If you have any questions after you get home, please call the office at [phone].

## 2017-06-02 NOTE — Telephone Encounter (Signed)
I spoke with Mr. Imbert regarding the results of his echo, which demonstrated LVEF of 40-45% with moderate MR. His symptoms are stable with mild DOE. We have discussed further evaluation options and have agreed to proceed with cardiac catheterization. We will schedule L/RHC at his convenience at [facility]. We will have him come in for routine pre-cath labs prior to the procedure.  I have reviewed the risks, indications, and alternatives to cardiac catheterization, possible angioplasty, and stenting with the patient. Risks include but are not limited to bleeding, infection, vascular injury, stroke, myocardial infection, arrhythmia, kidney injury, radiation-related injury in the case of prolonged fluoroscopy use, emergency cardiac surgery, and death. The patient understands the risks of serious complication is 1-2 in 3159 with diagnostic cardiac cath and 1-2% or less with angioplasty/stenting.  [name], MD [facility] Pager: [phone]

## 2017-06-03 ENCOUNTER — Ambulatory Visit
Admission: RE | Admit: 2017-06-03 | Discharge: 2017-06-03 | Disposition: A | Discharge status: Home / Self Care | Payer: Medicare HMO | Source: Ambulatory Visit | Attending: Internal Medicine | Admitting: Internal Medicine

## 2017-06-03 ENCOUNTER — Other Ambulatory Visit
Admission: RE | Admit: 2017-06-03 | Discharge: 2017-06-03 | Disposition: A | Discharge status: Home / Self Care | Payer: Medicare HMO | Source: Ambulatory Visit | Attending: Internal Medicine | Admitting: Internal Medicine

## 2017-06-03 DIAGNOSIS — R0609 Other forms of dyspnea: Principal | ICD-10-CM

## 2017-06-03 DIAGNOSIS — Z01818 Encounter for other preprocedural examination: Secondary | ICD-10-CM | POA: Diagnosis not present

## 2017-06-03 DIAGNOSIS — R918 Other nonspecific abnormal finding of lung field: Secondary | ICD-10-CM | POA: Insufficient documentation

## 2017-06-03 LAB — CBC WITH DIFFERENTIAL/PLATELET
BASOS PCT: 1 %
Basophils Absolute: 0.1 10*3/uL (ref 0–0.1)
EOS ABS: 0.1 10*3/uL (ref 0–0.7)
Eosinophils Relative: 2 %
HCT: 44.1 % (ref 40.0–52.0)
HEMOGLOBIN: 15.1 g/dL (ref 13.0–18.0)
LYMPHS ABS: 2.1 10*3/uL (ref 1.0–3.6)
Lymphocytes Relative: 25 %
MCH: 31.1 pg (ref 26.0–34.0)
MCHC: 34.2 g/dL (ref 32.0–36.0)
MCV: 91.2 fL (ref 80.0–100.0)
MONOS PCT: 8 %
Monocytes Absolute: 0.7 10*3/uL (ref 0.2–1.0)
NEUTROS PCT: 64 %
Neutro Abs: 5.3 10*3/uL (ref 1.4–6.5)
Platelets: 167 10*3/uL (ref 150–440)
RBC: 4.84 MIL/uL (ref 4.40–5.90)
RDW: 13.3 % (ref 11.5–14.5)
WBC: 8.3 10*3/uL (ref 3.8–10.6)

## 2017-06-03 LAB — BASIC METABOLIC PANEL
ANION GAP: 8 (ref 5–15)
BUN: 22 mg/dL — ABNORMAL HIGH (ref 6–20)
CALCIUM: 9.3 mg/dL (ref 8.9–10.3)
CHLORIDE: 104 mmol/L (ref 101–111)
CO2: 29 mmol/L (ref 22–32)
Creatinine, Ser: 1.42 mg/dL — ABNORMAL HIGH (ref 0.61–1.24)
GFR calc non Af Amer: 45 mL/min — ABNORMAL LOW (ref >60)
GFR, EST AFRICAN AMERICAN: 53 mL/min — AB (ref >60)
Glucose, Bld: 100 mg/dL — ABNORMAL HIGH (ref 65–99)
Potassium: 4.3 mmol/L (ref 3.5–5.1)
Sodium: 141 mmol/L (ref 135–145)

## 2017-06-03 LAB — PROTIME-INR
INR: 0.97
PROTHROMBIN TIME: 12.9 s (ref 11.4–15.2)

## 2017-06-04 ENCOUNTER — Telehealth: Payer: Self-pay | Admitting: *Deleted

## 2017-06-04 NOTE — Telephone Encounter (Signed)
Notified patient that his heart cath time has been changed to 06/13/17 at 12 noon, arrival time of 10:00am. Patient verbalized understanding and wrote this down on his paperwork.

## 2017-06-12 ENCOUNTER — Telehealth: Payer: Self-pay | Admitting: *Deleted

## 2017-06-12 ENCOUNTER — Telehealth: Payer: Self-pay

## 2017-06-12 NOTE — Telephone Encounter (Signed)
Patient contacted pre-catheterization at [facility] scheduled for: 06/13/2017 @ 1200  Verified arrival time and place: NT @ 1000-Pt states he knows where this is  Confirmed AM meds to be taken pre-cath with sip of water:  Reminded Pt to take ASA 81 mg in am prior to arrival  Confirmed patient has responsible person to drive home post procedure and observe patient for 24 hours: wife/family  Addl concerns:  Pre cath labs:  Cr 1.42- Dr. Saunders Revel aware

## 2017-06-12 NOTE — Telephone Encounter (Signed)
Patient verbalized understanding to arrive at 1000am on 06/13/17 at [facility] and of pre-procedural instructions. No further questions at this time.

## 2017-06-13 ENCOUNTER — Encounter (HOSPITAL_COMMUNITY)
Admission: RE | Disposition: A | Discharge status: Home / Self Care | Payer: Self-pay | Source: Ambulatory Visit | Attending: Internal Medicine

## 2017-06-13 ENCOUNTER — Ambulatory Visit (HOSPITAL_COMMUNITY)
Admission: RE | Admit: 2017-06-13 | Discharge: 2017-06-13 | Disposition: A | Discharge status: Home / Self Care | Payer: Medicare HMO | Source: Ambulatory Visit | Attending: Internal Medicine | Admitting: Internal Medicine

## 2017-06-13 ENCOUNTER — Other Ambulatory Visit: Payer: Self-pay | Admitting: Family Medicine

## 2017-06-13 DIAGNOSIS — M25569 Pain in unspecified knee: Secondary | ICD-10-CM | POA: Diagnosis not present

## 2017-06-13 DIAGNOSIS — Q245 Malformation of coronary vessels: Secondary | ICD-10-CM | POA: Insufficient documentation

## 2017-06-13 DIAGNOSIS — I2510 Atherosclerotic heart disease of native coronary artery without angina pectoris: Secondary | ICD-10-CM | POA: Diagnosis not present

## 2017-06-13 DIAGNOSIS — L719 Rosacea, unspecified: Secondary | ICD-10-CM | POA: Diagnosis not present

## 2017-06-13 DIAGNOSIS — I429 Cardiomyopathy, unspecified: Secondary | ICD-10-CM

## 2017-06-13 DIAGNOSIS — G8929 Other chronic pain: Secondary | ICD-10-CM | POA: Diagnosis not present

## 2017-06-13 DIAGNOSIS — I428 Other cardiomyopathies: Secondary | ICD-10-CM | POA: Diagnosis not present

## 2017-06-13 DIAGNOSIS — Z87891 Personal history of nicotine dependence: Secondary | ICD-10-CM | POA: Diagnosis not present

## 2017-06-13 DIAGNOSIS — E785 Hyperlipidemia, unspecified: Secondary | ICD-10-CM | POA: Diagnosis not present

## 2017-06-13 DIAGNOSIS — I272 Pulmonary hypertension, unspecified: Secondary | ICD-10-CM | POA: Insufficient documentation

## 2017-06-13 DIAGNOSIS — R0609 Other forms of dyspnea: Secondary | ICD-10-CM | POA: Diagnosis present

## 2017-06-13 DIAGNOSIS — Z7982 Long term (current) use of aspirin: Secondary | ICD-10-CM | POA: Diagnosis not present

## 2017-06-13 DIAGNOSIS — M1990 Unspecified osteoarthritis, unspecified site: Secondary | ICD-10-CM | POA: Insufficient documentation

## 2017-06-13 HISTORY — PX: RIGHT/LEFT HEART CATH AND CORONARY ANGIOGRAPHY: CATH118266

## 2017-06-13 LAB — POCT I-STAT 3, ART BLOOD GAS (G3+)
ACID-BASE EXCESS: 2 mmol/L (ref 0.0–2.0)
Bicarbonate: 27 mmol/L (ref 20.0–28.0)
O2 SAT: 97 %
TCO2: 28 mmol/L (ref 0–100)
pCO2 arterial: 44.4 mmHg (ref 32.0–48.0)
pH, Arterial: 7.392 (ref 7.350–7.450)
pO2, Arterial: 88 mmHg (ref 83.0–108.0)

## 2017-06-13 LAB — POCT I-STAT 3, VENOUS BLOOD GAS (G3P V)
ACID-BASE EXCESS: 1 mmol/L (ref 0.0–2.0)
ACID-BASE EXCESS: 3 mmol/L — AB (ref 0.0–2.0)
Acid-Base Excess: 3 mmol/L — ABNORMAL HIGH (ref 0.0–2.0)
BICARBONATE: 28.4 mmol/L — AB (ref 20.0–28.0)
Bicarbonate: 26.7 mmol/L (ref 20.0–28.0)
Bicarbonate: 28.7 mmol/L — ABNORMAL HIGH (ref 20.0–28.0)
O2 SAT: 75 %
O2 Saturation: 70 %
O2 Saturation: 75 %
PCO2 VEN: 47.5 mmHg (ref 44.0–60.0)
PH VEN: 7.389 (ref 7.250–7.430)
PH VEN: 7.402 (ref 7.250–7.430)
TCO2: 28 mmol/L (ref 0–100)
TCO2: 30 mmol/L (ref 0–100)
TCO2: 30 mmol/L (ref 0–100)
pCO2, Ven: 45.7 mmHg (ref 44.0–60.0)
pCO2, Ven: 45.7 mmHg (ref 44.0–60.0)
pH, Ven: 7.374 (ref 7.250–7.430)
pO2, Ven: 37 mmHg (ref 32.0–45.0)
pO2, Ven: 41 mmHg (ref 32.0–45.0)
pO2, Ven: 42 mmHg (ref 32.0–45.0)

## 2017-06-13 SURGERY — RIGHT/LEFT HEART CATH AND CORONARY ANGIOGRAPHY
Anesthesia: LOCAL

## 2017-06-13 MED ORDER — HEPARIN (PORCINE) IN NACL 2-0.9 UNIT/ML-% IJ SOLN
INTRAMUSCULAR | Status: AC
  Filled 2017-06-13: qty 500

## 2017-06-13 MED ORDER — SODIUM CHLORIDE 0.9 % IV SOLN: 250.0000 mL | INTRAVENOUS | Status: DC | PRN

## 2017-06-13 MED ORDER — SODIUM CHLORIDE 0.9% FLUSH: 3.0000 mL | INTRAVENOUS | Status: DC | PRN

## 2017-06-13 MED ORDER — HEPARIN (PORCINE) IN NACL 2-0.9 UNIT/ML-% IJ SOLN
INTRAMUSCULAR | Status: AC | PRN
  Administered 2017-06-13: 1000 mL

## 2017-06-13 MED ORDER — ASPIRIN 81 MG PO CHEW
CHEWABLE_TABLET | ORAL | Status: AC
  Administered 2017-06-13: 81 mg
  Filled 2017-06-13: qty 1

## 2017-06-13 MED ORDER — FENTANYL CITRATE (PF) 100 MCG/2ML IJ SOLN
INTRAMUSCULAR | Status: DC | PRN
  Administered 2017-06-13: 25 ug via INTRAVENOUS

## 2017-06-13 MED ORDER — LIDOCAINE HCL (PF) 1 % IJ SOLN
INTRAMUSCULAR | Status: DC | PRN
  Administered 2017-06-13: 2 mL
  Administered 2017-06-13: 1 mL

## 2017-06-13 MED ORDER — HEPARIN SODIUM (PORCINE) 1000 UNIT/ML IJ SOLN
INTRAMUSCULAR | Status: AC
  Filled 2017-06-13: qty 1

## 2017-06-13 MED ORDER — FENTANYL CITRATE (PF) 100 MCG/2ML IJ SOLN
INTRAMUSCULAR | Status: AC
  Filled 2017-06-13: qty 2

## 2017-06-13 MED ORDER — ASPIRIN 81 MG PO CHEW: 81.0000 mg | CHEWABLE_TABLET | ORAL | Status: DC

## 2017-06-13 MED ORDER — SODIUM CHLORIDE 0.9% FLUSH: 3.0000 mL | Freq: Two times a day (BID) | INTRAVENOUS | Status: DC

## 2017-06-13 MED ORDER — VERAPAMIL HCL 2.5 MG/ML IV SOLN
INTRAVENOUS | Status: AC
  Filled 2017-06-13: qty 2

## 2017-06-13 MED ORDER — VERAPAMIL HCL 2.5 MG/ML IV SOLN
INTRAVENOUS | Status: DC | PRN
  Administered 2017-06-13: 10 mL via INTRA_ARTERIAL

## 2017-06-13 MED ORDER — SODIUM CHLORIDE 0.9 % WEIGHT BASED INFUSION: 1.0000 mL/kg/h | INTRAVENOUS | Status: DC

## 2017-06-13 MED ORDER — IOPAMIDOL (ISOVUE-370) INJECTION 76%
INTRAVENOUS | Status: DC | PRN
  Administered 2017-06-13: 65 mL via INTRA_ARTERIAL

## 2017-06-13 MED ORDER — SODIUM CHLORIDE 0.9 % IV SOLN: INTRAVENOUS | Status: DC

## 2017-06-13 MED ORDER — MIDAZOLAM HCL 2 MG/2ML IJ SOLN
INTRAMUSCULAR | Status: DC | PRN
  Administered 2017-06-13: 1 mg via INTRAVENOUS

## 2017-06-13 MED ORDER — SODIUM CHLORIDE 0.9 % WEIGHT BASED INFUSION
3.0000 mL/kg/h | INTRAVENOUS | Status: AC
  Administered 2017-06-13: 3 mL/kg/h via INTRAVENOUS

## 2017-06-13 MED ORDER — IOPAMIDOL (ISOVUE-370) INJECTION 76%
INTRAVENOUS | Status: AC
Start: 2017-06-13 — End: 2017-06-13
  Filled 2017-06-13: qty 100

## 2017-06-13 MED ORDER — MIDAZOLAM HCL 2 MG/2ML IJ SOLN
INTRAMUSCULAR | Status: AC
  Filled 2017-06-13: qty 2

## 2017-06-13 MED ORDER — HEPARIN SODIUM (PORCINE) 1000 UNIT/ML IJ SOLN
INTRAMUSCULAR | Status: DC | PRN
  Administered 2017-06-13: 5000 [IU] via INTRAVENOUS

## 2017-06-13 MED ORDER — LIDOCAINE HCL 1 % IJ SOLN
INTRAMUSCULAR | Status: AC
Start: 2017-06-13 — End: 2017-06-13
  Filled 2017-06-13: qty 20

## 2017-06-13 SURGICAL SUPPLY — 13 items
CATH 5FR JL3.5 JR4 ANG PIG MP (CATHETERS) ×2 IMPLANT
CATH BALLN WEDGE 5F 110CM (CATHETERS) ×1 IMPLANT
COVER PRB 48X5XTLSCP FOLD TPE (BAG) IMPLANT
COVER PROBE 5X48 (BAG) ×2
DEVICE RAD COMP TR BAND LRG (VASCULAR PRODUCTS) ×1 IMPLANT
GLIDESHEATH SLEND SS 6F .021 (SHEATH) ×1 IMPLANT
GUIDEWIRE INQWIRE 1.5J.035X260 (WIRE) IMPLANT
INQWIRE 1.5J .035X260CM (WIRE) ×2
KIT HEART LEFT (KITS) ×2 IMPLANT
PACK CARDIAC CATHETERIZATION (CUSTOM PROCEDURE TRAY) ×2 IMPLANT
SHEATH GLIDE SLENDER 4/5FR (SHEATH) ×1 IMPLANT
TRANSDUCER W/STOPCOCK (MISCELLANEOUS) ×2 IMPLANT
TUBING CIL FLEX 10 FLL-RA (TUBING) ×2 IMPLANT

## 2017-06-13 NOTE — Progress Notes (Signed)
Site area: right brachial vein Site Prior to Removal:  Level 0 Pressure Applied For:15 min Manual:   yes Patient Status During Pull:  vss Post Pull Site:  Level 0 Post Pull Instructions Given:  yes Dressing Applied:  Gauze op site Bedrest begins @ na Comments:

## 2017-06-13 NOTE — Discharge Instructions (Signed)

## 2017-06-13 NOTE — H&P (Signed)
Cardiac Catheterization History and Physical:   Patient ID: Anthony Mosley; [id number]; 12-24-1934   Date: 06/13/2017  Primary Care Provider: Roselee Nova, MD Primary Cardiologist: Shamyra Farias Primary Electrophysiologist:  None  Patient Profile:   Anthony Mosley is a 82 y.o. male with a history of Nonobstructive coronary artery disease and abnormal EKG, found to have moderately reduced LV systolic function by echo.  History of Present Illness:   Anthony Mosley was recently evaluated for abnormal EKG and chronic exertional dyspnea with strenuous activity. He is referred for transthoracic echocardiogram, which showed an LVEF of 40-45% and mildly enlarged thoracic aorta. He has continued to have exertional dyspnea with strenuous activity, that has been present for years. He denies chest pain, palpitations, lightheadedness, orthopnea, PND, and edema. However, in light of his moderately reduced LV function and shortness of breath, Anthony Mosley has been referred for left and right heart catheterization.   Past Medical History:  Diagnosis Date  . Arthritis   . CAD (coronary artery disease)   . Cancer ([place])    SKIN  . Chronic knee pain   . Cough   . Edema    FEET/LEGS  . Hemorrhoids   . Hyperbilirubinemia   . Hyperlipidemia   . PONV (postoperative nausea and vomiting)    after first cataract  . Rosacea   . Wheezing     Past Surgical History:  Procedure Laterality Date  . APPENDECTOMY    . CARDIAC CATHETERIZATION  2005  . CARDIAC CATHETERIZATION  2002  . CATARACT EXTRACTION W/PHACO Right 10/24/2015   Procedure: CATARACT EXTRACTION PHACO AND INTRAOCULAR LENS PLACEMENT (IOC);  Surgeon: [name], MD;  Location: [facility] ORS;  Service: Ophthalmology;  Laterality: Right;  [place] 00:52 AP% 23.5 CDE 12.35 fluid pack lot # [id number]  . CATARACT EXTRACTION W/PHACO Left 11/14/2015   Procedure: CATARACT EXTRACTION PHACO AND INTRAOCULAR LENS PLACEMENT (IOC);  Surgeon: [name],  MD;  Location: [facility] ORS;  Service: Ophthalmology;  Laterality: Left;  [place] 00:55 AP% 19.1 CDE 10.66 fluid pack lot # [id number]  . COLONOSCOPY  1987  . HERNIA REPAIR    . KNEE ARTHROSCOPY    . TONSILLECTOMY       Medications Prior to Admission: Prior to Admission medications   Medication Sig Start Date Zaiden Ludlum Date Taking? Authorizing Provider  aspirin EC 81 MG tablet Take 81 mg by mouth daily.   Yes [provider]  B COMPLEX VITAMINS PO Take 1 tablet by mouth daily.    Yes [provider]  Carboxymethylcellul-Glycerin (LUBRICATING EYE DROPS OP) Apply 1 drop to eye at bedtime.   Yes [provider]  Guaifenesin (MUCINEX MAXIMUM STRENGTH) 1200 MG TB12 Take 1,200 mg by mouth daily.   Yes [provider]  simvastatin (ZOCOR) 40 MG tablet TAKE 1 TABLET EVERY DAY  AT  6PM 03/20/17  Yes Roselee Nova, MD  Vitamin D, Ergocalciferol, (DRISDOL) 50000 units CAPS capsule Take 1 capsule (50,000 Units total) by mouth once a week. For 12 weeks Patient taking differently: Take 50,000 Units by mouth every Monday. For 12 weeks 04/15/17  Yes Roselee Nova, MD     Allergies:    Allergies  Allergen Reactions  . Avodart  [Dutasteride] Itching  . Macrolides And Ketolides Other (See Comments)    Due to prolonged Q-T syndrome  . Norvasc  [Amlodipine]     bradycardia    Social History:   Social History   Social History  . Marital  status: Married    Spouse name: N/A  . Number of children: N/A  . Years of education: N/A   Occupational History  . Not on file.   Social History Main Topics  . Smoking status: Former Smoker    Types: Cigarettes  . Smokeless tobacco: Former Systems developer    Types: Chew     Comment: quit > 30 yrs ago  . Alcohol use No  . Drug use: No  . Sexual activity: No   Other Topics Concern  . Not on file   Social History Narrative  . No narrative on file    Family History: The patient's family history includes Alzheimer's disease in his  mother; Diabetes in his brother and father; Heart attack (age of onset: 60) in his father; Heart disease in his brother and brother; Pulmonary embolism in his father.    ROS:  Please see the history of present illness.  All other ROS reviewed and negative.     Physical Exam/Data:   Vitals:   06/13/17 0956  BP: (!) 149/79  Pulse: (!) 58  Temp: 97.6 F (36.4 C)  TempSrc: Oral  SpO2: 97%  Weight: 100.7 kg (222 lb)  Height: 6\' 1"  (1.854 m)   No intake or output data in the 24 hours ending 06/13/17 1255 Filed Weights   06/13/17 0956  Weight: 100.7 kg (222 lb)   Body mass index is 29.29 kg/m.  General:  Well nourished, well developed, in no acute distress. HEENT: normal Lymph: no adenopathy Neck: no JVD Endocrine:  No thryomegaly Vascular: No carotid bruits; FA pulses 2+ bilaterally without bruits  Cardiac:  normal S1, S2; RRR; no murmur. Heart sounds are distant Lungs:  clear to auscultation bilaterally, no wheezing, rhonchi or rales  Abd: soft, nontender, no hepatomegaly  Ext: no edema Musculoskeletal:  No deformities, BUE and BLE strength normal and equal Skin: warm and dry  Neuro:  CNs 2-12 intact, no focal abnormalities noted Psych:  Normal affect    EKG:  The ECG that was done today was personally reviewed and demonstrates sinus bradycardia with PACs, RBBB, and LAFB  Relevant CV Studies: TTE (05/29/17): - Left ventricle: The cavity size was normal. There was mild   concentric hypertrophy. Systolic function was mildly to   moderately reduced. The estimated ejection fraction was in the   range of 40% to 45%. Images were inadequate for LV wall motion   assessment. Doppler parameters are consistent with abnormal left   ventricular relaxation (grade 1 diastolic dysfunction). - Aortic valve: There was mild regurgitation. - Aorta: Aortic root dimension: 38 mm (ED). Ascending aortic   diameter: 42 mm (S). - Mitral valve: There was moderate regurgitation. - Left atrium:  The atrium was mildly dilated. - Right ventricle: The cavity size was mildly dilated. Wall   thickness was normal. - Right atrium: The atrium was moderately dilated. - Pulmonary arteries: Systolic pressure was within the normal   range.  Laboratory Data:  ChemistryNo results for input(s): NA, K, CL, CO2, GLUCOSE, BUN, CREATININE, CALCIUM, GFRNONAA, GFRAA, ANIONGAP in the last 168 hours.  No results for input(s): PROT, ALBUMIN, AST, ALT, ALKPHOS, BILITOT in the last 168 hours. HematologyNo results for input(s): WBC, RBC, HGB, HCT, MCV, MCH, MCHC, RDW, PLT in the last 168 hours. Cardiac EnzymesNo results for input(s): TROPONINI in the last 168 hours. No results for input(s): TROPIPOC in the last 168 hours.  BNPNo results for input(s): BNP, PROBNP in the last 168 hours.  DDimer No  results for input(s): DDIMER in the last 168 hours.  Radiology/Studies:  No results found.  Assessment and Plan:   1. DOE with abnormal echo. Etiology of cardiomyopathy is unclear. We have agreed perform a L/RHC with possible PCI for further evaluation. I have reviewed the risks, indications, and alternatives to cardiac catheterization, possible angioplasty, and stenting with the patient. Risks include but are not limited to bleeding, infection, vascular injury, stroke, myocardial infection, arrhythmia, kidney injury, radiation-related injury in the case of prolonged fluoroscopy use, emergency cardiac surgery, and death. The patient understands the risks of serious complication is 1-2 in 7445 with diagnostic cardiac cath and 1-2% or less with angioplasty/stenting.  Signed, [name], MD  06/13/2017 12:55 PM

## 2017-06-16 ENCOUNTER — Encounter (HOSPITAL_COMMUNITY): Payer: Self-pay | Admitting: Internal Medicine

## 2017-06-16 ENCOUNTER — Telehealth: Payer: Self-pay | Admitting: Internal Medicine

## 2017-06-16 DIAGNOSIS — I428 Other cardiomyopathies: Secondary | ICD-10-CM

## 2017-06-16 NOTE — Telephone Encounter (Signed)
It is fine for Anthony Mosley to proceed with dental extractions. No further cardiac testing or intervention is needed prior to the dental work. Thanks.  [name], MD [facility] Pager: [phone]

## 2017-06-16 NOTE — Telephone Encounter (Signed)
Spoke with patient and let him know that Dr. Saunders Revel wanted to have some post procedure labs done. He verbalized understanding and was agreeable to come in on Thursday to the [facility] Entrance to have these done. Patient also stated that he went to the dentist to have 2 extractions done and because of him having recent procedure they are requiring cardiac clearance before proceeding with his extractions. He states that he is having discomfort and pain to those areas and just can't wait 2 weeks to have them done. He requested [place] to fax cardiac clearance to his dentist Ruthy Dick at Fax # [phone] and phone number is [phone]. Let him know that I would route to Dr. Saunders Revel for his review. He verbalized understanding of our conversation, agreement with plan, and had no further questions at this time.

## 2017-06-16 NOTE — Telephone Encounter (Signed)
Spoke with patient and made him aware that from cardiac standpoint he could proceed with dental extractions. He was appreciative for the call and had no further questions. Will route message to number provided.

## 2017-06-16 NOTE — Telephone Encounter (Signed)
Pt calling stating he had a procedure last week with Dr End and was advised by him to come back and have labs done   He is calling to see if we can go ahead and schedule this But I did not see any orders  Please advise.

## 2017-06-19 ENCOUNTER — Other Ambulatory Visit
Admission: RE | Admit: 2017-06-19 | Discharge: 2017-06-19 | Disposition: A | Discharge status: Home / Self Care | Payer: Medicare HMO | Source: Ambulatory Visit | Attending: Internal Medicine | Admitting: Internal Medicine

## 2017-06-19 ENCOUNTER — Other Ambulatory Visit: Payer: Self-pay

## 2017-06-19 DIAGNOSIS — I428 Other cardiomyopathies: Secondary | ICD-10-CM | POA: Insufficient documentation

## 2017-06-19 LAB — BASIC METABOLIC PANEL
ANION GAP: 8 (ref 5–15)
BUN: 24 mg/dL — ABNORMAL HIGH (ref 6–20)
CHLORIDE: 104 mmol/L (ref 101–111)
CO2: 27 mmol/L (ref 22–32)
Calcium: 9 mg/dL (ref 8.9–10.3)
Creatinine, Ser: 1.16 mg/dL (ref 0.61–1.24)
GFR calc non Af Amer: 58 mL/min — ABNORMAL LOW (ref >60)
Glucose, Bld: 98 mg/dL (ref 65–99)
POTASSIUM: 4.2 mmol/L (ref 3.5–5.1)
SODIUM: 139 mmol/L (ref 135–145)

## 2017-06-19 MED ORDER — LISINOPRIL 2.5 MG PO TABS: 2.5000 mg | ORAL_TABLET | Freq: Every day | ORAL | 3 refills | Status: DC

## 2017-06-19 MED ORDER — FUROSEMIDE 20 MG PO TABS: 20.0000 mg | ORAL_TABLET | Freq: Every day | ORAL | 3 refills | Status: DC

## 2017-07-02 ENCOUNTER — Ambulatory Visit (INDEPENDENT_AMBULATORY_CARE_PROVIDER_SITE_OTHER): Payer: Medicare HMO | Admitting: Internal Medicine

## 2017-07-02 ENCOUNTER — Encounter: Payer: Self-pay | Admitting: Internal Medicine

## 2017-07-02 VITALS — BP 100/64 | HR 53 | Ht 73.0 in | Wt 216.8 lb

## 2017-07-02 DIAGNOSIS — I712 Thoracic aortic aneurysm, without rupture, unspecified: Secondary | ICD-10-CM

## 2017-07-02 DIAGNOSIS — I428 Other cardiomyopathies: Secondary | ICD-10-CM

## 2017-07-02 DIAGNOSIS — I2510 Atherosclerotic heart disease of native coronary artery without angina pectoris: Secondary | ICD-10-CM | POA: Insufficient documentation

## 2017-07-02 DIAGNOSIS — I429 Cardiomyopathy, unspecified: Secondary | ICD-10-CM

## 2017-07-02 NOTE — Patient Instructions (Signed)
Medication Instructions:  Your physician recommends that you continue on your current medications as directed. Please refer to the Current Medication list given to you today.   Labwork: BMET today  Testing/Procedures: none  Follow-Up: Your physician recommends that you schedule a follow-up appointment in 3-4 months with Dr. Saunders Revel.    Any Other Special Instructions Will Be Listed Below (If Applicable).     If you need a refill on your cardiac medications before your next appointment, please call your pharmacy.

## 2017-07-02 NOTE — Progress Notes (Signed)
Follow-up Outpatient Visit Date: 07/02/2017  Primary Care Provider: Roselee Nova, MD [address] [place] [address]  Chief Complaint: Follow-up cardiomyopathy  HPI:  Anthony Mosley is a 82 y.o. year-old male with history of nonobstructive coronary artery disease and nonischemic cardiomyopathy with moderately reduced LVEF, who presents for follow-up of cardiomyopathy. I met him on 04/23/17 for evaluation of abnormal EKG. Subsequent echo showed LVEF of 40-45%. We therefore agreed to perform left and right heart catheterization, which showed nonobstructive CAD and mildly elevated left and right heart filling pressures. We says, we started Mr. Mccollam on low-dose lisinopril and furosemide, which he has been tolerating well. He denies chest pain, shortness of breath, palpitations, and edema. He has noted occasional lightheadedness when he bends over for several minutes and then stands up quickly. He also had at the cath prep sites at his right wrist, right elbow, and right groin. Though symptoms have since resolved.[name]  -------------------------------------------------------------------------------------------------- Cardiovascular History & Procedures: Cardiovascular Problems:  Abnormal EKG  Nonobstructive coronary artery disease  Mildly dilated ascending aorta.  Risk Factors:  Known coronary artery disease, male gender, and age greater than 76  Cath/PCI:  L/RHC(06/13/17): LMCA normal. 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 Ramus unremarkable. Normal LCx. RCA with 40% proximal and 30% distal lesions. Proximal RPL with 10% stenosis. RA 14, RV 40/15, PA 40/20 (27), and PCWP 20. AO sat 97%, PA sat 73%, RA sat 75%. Fick CO/CI 6.6/2.9. PVR 1.1. LVEDP 20-23.  LHC (07/20/04): LMCA normal. LAD with mild luminal irregularities. LCx without significant disease. Dominant RCA with luminal irregularities and ectasia and accompanying sluggish flow. No fixed obstruction.  LVEF 55%.  LHC (01/29/01): LMCA normal. 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 LCx normal. RCA with sequential 60% and 40% proximal and mid vessel stenoses. LVEF 65% with mild inferior hypokinesis.  CV Surgery:  None  EP Procedures and Devices:  None  Non-Invasive Evaluation(s):  TTE (05/29/17): Normal LV size with mild LVH. LVEF 40-45% with grade 1 diastolic dysfunction. Mild AI. Mildly dilated aorta, with root measuring 3.8 cm and descending aorta 4.2 cm. Mitral regurgitation. Mild left atrial enlargement. Mildly dilated RV with normal wall thickness. Moderately enlarged right atrium. Normal PA pressure.  Exercise MPI (09/01/03): Normal study without ischemia or scar. LVEF 54%.  Recent CV Pertinent Labs: Lab Results  Component Value Date   CHOL 131 04/07/2017   CHOL 143 05/03/2016   HDL 49 04/07/2017   HDL 49 05/03/2016   LDLCALC 67 04/07/2017   LDLCALC 78 05/03/2016   TRIG 73 04/07/2017   CHOLHDL 2.7 04/07/2017   INR 0.97 06/03/2017   K 4.2 06/19/2017   BUN 24 (H) 06/19/2017   BUN 16 04/03/2016   CREATININE 1.16 06/19/2017   CREATININE 1.22 (H) 04/07/2017    Past medical and surgical history were reviewed and updated in EPIC.  Current Meds  Medication Sig  . aspirin EC 81 MG tablet Take 81 mg by mouth daily.  . B COMPLEX VITAMINS PO Take 1 tablet by mouth daily.   . Carboxymethylcellul-Glycerin (LUBRICATING EYE DROPS OP) Apply 1 drop to eye at bedtime.  . furosemide (LASIX) 20 MG tablet Take 1 tablet (20 mg total) by mouth daily.  . Guaifenesin (MUCINEX MAXIMUM STRENGTH) 1200 MG TB12 Take 1,200 mg by mouth daily.  [name] lisinopril (PRINIVIL,ZESTRIL) 2.5 MG tablet Take 1 tablet (2.5 mg total) by mouth daily.  . simvastatin (ZOCOR) 40 MG tablet TAKE 1  TABLET EVERY DAY  AT  6PM  . Vitamin D, Ergocalciferol, (DRISDOL) 50000 units CAPS capsule Take 1 capsule (50,000 Units total) by mouth once a week. For 12 weeks (Patient taking  differently: Take 50,000 Units by mouth every Monday. For 12 weeks)    Allergies: Avodart  [dutasteride]; Macrolides and ketolides; and Norvasc  [amlodipine]  Social History   Social History  . Marital status: Married    Spouse name: N/A  . Number of children: N/A  . Years of education: N/A   Occupational History  . Not on file.   Social History Main Topics  . Smoking status: Former Smoker    Types: Cigarettes  . Smokeless tobacco: Former Systems developer    Types: Chew     Comment: quit > 30 yrs ago  . Alcohol use No  . Drug use: No  . Sexual activity: No   Other Topics Concern  . Not on file   Social History Narrative  . No narrative on file    Family History  Problem Relation Age of Onset  . Alzheimer's disease Mother   . Pulmonary embolism Father   . Diabetes Father   . Heart attack Father 66  . Diabetes Brother   . Heart disease Brother        CABG  . Heart disease Brother        CABG    Review of Systems: A 12-system review of systems was performed and was negative except as noted in the HPI.  --------------------------------------------------------------------------------------------------  Physical Exam: BP 100/64 (BP Location: Left Arm, Patient Position: Sitting, Cuff Size: Normal)   Pulse (!) 53   Ht _0  (1.854 m)   Wt 216 lb 12 oz (98.3 kg)   BMI 28.60 kg/m   General:  Overweight, elderly man, seated comfortably in the exam room. HEENT: No conjunctival pallor or scleral icterus. Moist mucous membranes.  OP clear. Neck: Supple without lymphadenopathy, thyromegaly, JVD, or HJR. No carotid bruit. Lungs: Normal work of breathing. Clear to auscultation bilaterally without wheezes or crackles. Heart: Regular rate and rhythm with 2/6 holosystolic murmur. No rubs or gallops. Non-displaced PMI. Abd: Bowel sounds present. Soft, NT/ND without hepatosplenomegaly Ext: No lower extremity edema. Radial, PT, and DP pulses are 2+ bilaterally. Skin: Warm and dry  without rash.  Lab Results  Component Value Date   WBC 8.3 06/03/2017   HGB 15.1 06/03/2017   HCT 44.1 06/03/2017   MCV 91.2 06/03/2017   PLT 167 06/03/2017    Lab Results  Component Value Date   NA 139 06/19/2017   K 4.2 06/19/2017   CL 104 06/19/2017   CO2 27 06/19/2017   BUN 24 (H) 06/19/2017   CREATININE 1.16 06/19/2017   GLUCOSE 98 06/19/2017   ALT 10 04/07/2017    Lab Results  Component Value Date   CHOL 131 04/07/2017   HDL 49 04/07/2017   LDLCALC 67 04/07/2017   TRIG 73 04/07/2017   CHOLHDL 2.7 04/07/2017    --------------------------------------------------------------------------------------------------  ASSESSMENT AND PLAN: Nonischemic cardiomyopathy Mr. Lacount appears euvolemic and well compensated on exam today with NYHA class I heart failure symptoms. He is tolerating furosemide and lisinopril well. We will check a basic metabolic panel today to reassess his renal function and potassium. We will continue his current regimen; I am hesitant to up titrate lisinopril at this time given borderline low blood pressure and occasional orthostatic lightheadedness. I encouraged Mr. Florea to stay well-hydrated.  Nonobstructive coronary artery disease No angina or  other symptoms to suggest worsening coronary insufficiency. We will continue simvastatin, given LDL earlier this year of 59.  Thoracic aortic aneurysm Mildly dilated ascending aorta noted on prior echo. No symptoms reported by patient. We will discuss cross-sectional imaging versus repeat echo when he follows up.  Follow-up: Return to clinic in 3-4 months.  [name], MD 07/02/2017 11:15 AM

## 2017-07-03 LAB — BASIC METABOLIC PANEL
BUN / CREAT RATIO: 19 (ref 10–24)
BUN: 22 mg/dL (ref 8–27)
CHLORIDE: 102 mmol/L (ref 96–106)
CO2: 23 mmol/L (ref 20–29)
Calcium: 8.9 mg/dL (ref 8.6–10.2)
Creatinine, Ser: 1.15 mg/dL (ref 0.76–1.27)
GFR calc Af Amer: 70 mL/min/{1.73_m2} (ref >59)
GFR calc non Af Amer: 60 mL/min/{1.73_m2} (ref >59)
GLUCOSE: 90 mg/dL (ref 65–99)
POTASSIUM: 4.4 mmol/L (ref 3.5–5.2)
SODIUM: 143 mmol/L (ref 134–144)

## 2017-07-07 ENCOUNTER — Ambulatory Visit: Payer: Commercial Managed Care - HMO | Admitting: Family Medicine

## 2017-07-09 ENCOUNTER — Ambulatory Visit (INDEPENDENT_AMBULATORY_CARE_PROVIDER_SITE_OTHER): Payer: Commercial Managed Care - HMO | Admitting: Family Medicine

## 2017-07-09 ENCOUNTER — Encounter: Payer: Self-pay | Admitting: Family Medicine

## 2017-07-09 NOTE — Progress Notes (Signed)
This encounter was created in error - please disregard.Name: Anthony Mosley   MRN: [id number]    DOB: 1935-03-23   Date:07/09/2017       Progress Note  Subjective  Chief Complaint  Chief Complaint  Patient presents with  . Follow-up    3 mo    HPI    Past Medical History:  Diagnosis Date  . Arthritis   . CAD (coronary artery disease)   . Cancer ([place])    SKIN  . Chronic knee pain   . Cough   . Edema    FEET/LEGS  . Hemorrhoids   . Hyperbilirubinemia   . Hyperlipidemia   . PONV (postoperative nausea and vomiting)    after first cataract  . Rosacea   . Wheezing     Past Surgical History:  Procedure Laterality Date  . APPENDECTOMY    . CARDIAC CATHETERIZATION  2005  . CARDIAC CATHETERIZATION  2002  . CATARACT EXTRACTION W/PHACO Right 10/24/2015   Procedure: CATARACT EXTRACTION PHACO AND INTRAOCULAR LENS PLACEMENT (IOC);  Surgeon: [name], MD;  Location: [facility] ORS;  Service: Ophthalmology;  Laterality: Right;  [place] 00:52 AP% 23.5 CDE 12.35 fluid pack lot # [id number]  . CATARACT EXTRACTION W/PHACO Left 11/14/2015   Procedure: CATARACT EXTRACTION PHACO AND INTRAOCULAR LENS PLACEMENT (IOC);  Surgeon: [name], MD;  Location: [facility] ORS;  Service: Ophthalmology;  Laterality: Left;  [place] 00:55 AP% 19.1 CDE 10.66 fluid pack lot # [id number]  . COLONOSCOPY  1987  . HERNIA REPAIR    . KNEE ARTHROSCOPY    . RIGHT/LEFT HEART CATH AND CORONARY ANGIOGRAPHY N/A 06/13/2017   Procedure: Right/Left Heart Cath and Coronary Angiography;  Surgeon: [name], MD;  Location: [facility];  Service: Cardiovascular;  Laterality: N/A;  . TONSILLECTOMY      Family History  Problem Relation Age of Onset  . Alzheimer's disease Mother   . Pulmonary embolism Father   . Diabetes Father   . Heart attack Father 72  . Diabetes Brother   . Heart disease Brother        CABG  . Heart disease Brother        CABG    Social History   Social History  . Marital  status: Married    Spouse name: N/A  . Number of children: N/A  . Years of education: N/A   Occupational History  . Not on file.   Social History Main Topics  . Smoking status: Former Smoker    Types: Cigarettes  . Smokeless tobacco: Former Systems developer    Types: Chew     Comment: quit > 30 yrs ago  . Alcohol use No  . Drug use: No  . Sexual activity: No   Other Topics Concern  . Not on file   Social History Narrative  . No narrative on file     Current Outpatient Prescriptions:  .  aspirin EC 81 MG tablet, Take 81 mg by mouth daily., Disp: , Rfl:  .  B COMPLEX VITAMINS PO, Take 1 tablet by mouth daily. , Disp: , Rfl:  .  Carboxymethylcellul-Glycerin (LUBRICATING EYE DROPS OP), Apply 1 drop to eye at bedtime., Disp: , Rfl:  .  furosemide (LASIX) 20 MG tablet, Take 1 tablet (20 mg total) by mouth daily., Disp: 90 tablet, Rfl: 3 .  Guaifenesin (MUCINEX MAXIMUM STRENGTH) 1200 MG TB12, Take 1,200 mg by mouth daily., Disp: , Rfl:  .  lisinopril (PRINIVIL,ZESTRIL) 2.5 MG tablet, Take 1 tablet (  2.5 mg total) by mouth daily., Disp: 90 tablet, Rfl: 3 .  simvastatin (ZOCOR) 40 MG tablet, TAKE 1 TABLET EVERY DAY  AT  6PM, Disp: 90 tablet, Rfl: 0 .  Vitamin D, Ergocalciferol, (DRISDOL) 50000 units CAPS capsule, Take 1 capsule (50,000 Units total) by mouth once a week. For 12 weeks (Patient taking differently: Take 50,000 Units by mouth every Monday. For 12 weeks), Disp: 12 capsule, Rfl: 0  Allergies  Allergen Reactions  . Avodart  [Dutasteride] Itching  . Macrolides And Ketolides Other (See Comments)    Due to prolonged Q-T syndrome  . Norvasc  [Amlodipine]     bradycardia     ROS    Objective  Vitals:   07/09/17 1438  BP: 108/68  Pulse: 60  Resp: 16  Temp: (!) 97.5 F (36.4 C)  TempSrc: Oral  SpO2: 96%  Weight: 222 lb 1.6 oz (100.7 kg)  Height: '6\' 1"'  (1.854 m)    Physical Exam     Recent Results (from the past 2160 hour(s))  Bilirubin, fractionated(tot/dir/indir)      Status: Abnormal   Collection Time: 05/19/17  3:45 PM  Result Value Ref Range   Bilirubin Total 1.1 0.0 - 1.2 mg/dL   Bilirubin, Direct 0.24 0.00 - 0.40 mg/dL   Bilirubin, Indirect 0.86 (H) 0.10 - 0.80 mg/dL  Basic metabolic panel     Status: Abnormal   Collection Time: 06/03/17  3:47 PM  Result Value Ref Range   Sodium 141 135 - 145 mmol/L   Potassium 4.3 3.5 - 5.1 mmol/L   Chloride 104 101 - 111 mmol/L   CO2 29 22 - 32 mmol/L   Glucose, Bld 100 (H) 65 - 99 mg/dL   BUN 22 (H) 6 - 20 mg/dL   Creatinine, Ser 1.42 (H) 0.61 - 1.24 mg/dL   Calcium 9.3 8.9 - 10.3 mg/dL   GFR calc non Af Amer 45 (L) >60 mL/min   GFR calc Af Amer 53 (L) >60 mL/min    Comment: (NOTE) The eGFR has been calculated using the CKD EPI equation. This calculation has not been validated in all clinical situations. eGFR's persistently <60 mL/min signify possible Chronic Kidney Disease.    Anion gap 8 5 - 15  CBC with Differential/Platelet     Status: None   Collection Time: 06/03/17  3:47 PM  Result Value Ref Range   WBC 8.3 3.8 - 10.6 K/uL   RBC 4.84 4.40 - 5.90 MIL/uL   Hemoglobin 15.1 13.0 - 18.0 g/dL   HCT 44.1 40.0 - 52.0 %   MCV 91.2 80.0 - 100.0 fL   MCH 31.1 26.0 - 34.0 pg   MCHC 34.2 32.0 - 36.0 g/dL   RDW 13.3 11.5 - 14.5 %   Platelets 167 150 - 440 K/uL   Neutrophils Relative % 64 %   Neutro Abs 5.3 1.4 - 6.5 K/uL   Lymphocytes Relative 25 %   Lymphs Abs 2.1 1.0 - 3.6 K/uL   Monocytes Relative 8 %   Monocytes Absolute 0.7 0.2 - 1.0 K/uL   Eosinophils Relative 2 %   Eosinophils Absolute 0.1 0 - 0.7 K/uL   Basophils Relative 1 %   Basophils Absolute 0.1 0 - 0.1 K/uL  Protime-INR     Status: None   Collection Time: 06/03/17  3:47 PM  Result Value Ref Range   Prothrombin Time 12.9 11.4 - 15.2 seconds   INR 0.97   I-STAT 3, venous blood gas (G3P V)  Status: Abnormal   Collection Time: 06/13/17  3:04 PM  Result Value Ref Range   pH, Ven 7.402 7.250 - 7.430   pCO2, Ven 45.7 44.0 -  60.0 mmHg   pO2, Ven 37.0 32.0 - 45.0 mmHg   Bicarbonate 28.4 (H) 20.0 - 28.0 mmol/L   TCO2 30 0 - 100 mmol/L   O2 Saturation 70.0 %   Acid-Base Excess 3.0 (H) 0.0 - 2.0 mmol/L   Patient temperature HIDE    Sample type VENOUS    Comment NOTIFIED PHYSICIAN   I-STAT 3, arterial blood gas (G3+)     Status: None   Collection Time: 06/13/17  3:09 PM  Result Value Ref Range   pH, Arterial 7.392 7.350 - 7.450   pCO2 arterial 44.4 32.0 - 48.0 mmHg   pO2, Arterial 88.0 83.0 - 108.0 mmHg   Bicarbonate 27.0 20.0 - 28.0 mmol/L   TCO2 28 0 - 100 mmol/L   O2 Saturation 97.0 %   Acid-Base Excess 2.0 0.0 - 2.0 mmol/L   Patient temperature HIDE    Sample type ARTERIAL   I-STAT 3, venous blood gas (G3P V)     Status: Abnormal   Collection Time: 06/13/17  3:13 PM  Result Value Ref Range   pH, Ven 7.389 7.250 - 7.430   pCO2, Ven 47.5 44.0 - 60.0 mmHg   pO2, Ven 41.0 32.0 - 45.0 mmHg   Bicarbonate 28.7 (H) 20.0 - 28.0 mmol/L   TCO2 30 0 - 100 mmol/L   O2 Saturation 75.0 %   Acid-Base Excess 3.0 (H) 0.0 - 2.0 mmol/L   Patient temperature HIDE    Sample type VENOUS   I-STAT 3, venous blood gas (G3P V)     Status: None   Collection Time: 06/13/17  3:20 PM  Result Value Ref Range   pH, Ven 7.374 7.250 - 7.430   pCO2, Ven 45.7 44.0 - 60.0 mmHg   pO2, Ven 42.0 32.0 - 45.0 mmHg   Bicarbonate 26.7 20.0 - 28.0 mmol/L   TCO2 28 0 - 100 mmol/L   O2 Saturation 75.0 %   Acid-Base Excess 1.0 0.0 - 2.0 mmol/L   Patient temperature HIDE    Sample type VENOUS   Basic metabolic panel     Status: Abnormal   Collection Time: 06/19/17 10:55 AM  Result Value Ref Range   Sodium 139 135 - 145 mmol/L   Potassium 4.2 3.5 - 5.1 mmol/L   Chloride 104 101 - 111 mmol/L   CO2 27 22 - 32 mmol/L   Glucose, Bld 98 65 - 99 mg/dL   BUN 24 (H) 6 - 20 mg/dL   Creatinine, Ser 1.16 0.61 - 1.24 mg/dL   Calcium 9.0 8.9 - 10.3 mg/dL   GFR calc non Af Amer 58 (L) >60 mL/min   GFR calc Af Amer >60 >60 mL/min    Comment:  (NOTE) The eGFR has been calculated using the CKD EPI equation. This calculation has not been validated in all clinical situations. eGFR's persistently <60 mL/min signify possible Chronic Kidney Disease.    Anion gap 8 5 - 15  Basic Metabolic Panel (BMET)     Status: None   Collection Time: 07/02/17 11:27 AM  Result Value Ref Range   Glucose 90 65 - 99 mg/dL   BUN 22 8 - 27 mg/dL   Creatinine, Ser 1.15 0.76 - 1.27 mg/dL   GFR calc non Af Amer 60 >59 mL/min/1.73   GFR calc Af Amer 70 >  59 mL/min/1.73   BUN/Creatinine Ratio 19 10 - 24   Sodium 143 134 - 144 mmol/L   Potassium 4.4 3.5 - 5.2 mmol/L   Chloride 102 96 - 106 mmol/L   CO2 23 20 - 29 mmol/L   Calcium 8.9 8.6 - 10.2 mg/dL     Assessment & Plan  There are no diagnoses linked to this encounter.  Daril Warga [name]. [facility] 07/09/2017 2:45 PM

## 2017-08-07 ENCOUNTER — Other Ambulatory Visit: Payer: Self-pay | Admitting: Family Medicine

## 2017-08-07 ENCOUNTER — Encounter: Payer: Self-pay | Admitting: Family Medicine

## 2017-08-07 DIAGNOSIS — R195 Other fecal abnormalities: Secondary | ICD-10-CM

## 2017-08-08 ENCOUNTER — Other Ambulatory Visit: Payer: Self-pay

## 2017-08-08 ENCOUNTER — Telehealth: Payer: Self-pay

## 2017-08-08 ENCOUNTER — Encounter: Payer: Self-pay | Admitting: Family Medicine

## 2017-08-08 DIAGNOSIS — R195 Other fecal abnormalities: Secondary | ICD-10-CM

## 2017-08-08 NOTE — Telephone Encounter (Signed)
Gastroenterology Pre-Procedure Review  Request Date: 9/7 Requesting Physician: Dr. Allen Norris  PATIENT REVIEW QUESTIONS: The patient responded to the following health history questions as indicated:    1. Are you having any GI issues? no 2. Do you have a personal history of Polyps? no 3. Do you have a family history of Colon Cancer or Polyps? no 4. Diabetes Mellitus? no 5. Joint replacements in the past 12 months?no 6. Major health problems in the past 3 months?no 7. Any artificial heart valves, MVP, or defibrillator?yes (Catherization: 06/2017)    MEDICATIONS & ALLERGIES:    Patient reports the following regarding taking any anticoagulation/antiplatelet therapy:   Plavix, Coumadin, Eliquis, Xarelto, Lovenox, Pradaxa, Brilinta, or Effient? no Aspirin? yes (81mg )  Patient confirms/reports the following medications:  Current Outpatient Prescriptions  Medication Sig Dispense Refill  . aspirin EC 81 MG tablet Take 81 mg by mouth daily.    . B COMPLEX VITAMINS PO Take 1 tablet by mouth daily.     . Carboxymethylcellul-Glycerin (LUBRICATING EYE DROPS OP) Apply 1 drop to eye at bedtime.    . furosemide (LASIX) 20 MG tablet Take 1 tablet (20 mg total) by mouth daily. 90 tablet 3  . Guaifenesin (MUCINEX MAXIMUM STRENGTH) 1200 MG TB12 Take 1,200 mg by mouth daily.    [name] lisinopril (PRINIVIL,ZESTRIL) 2.5 MG tablet Take 1 tablet (2.5 mg total) by mouth daily. 90 tablet 3  . simvastatin (ZOCOR) 40 MG tablet TAKE 1 TABLET EVERY DAY  AT  6PM 90 tablet 0  . Vitamin D, Ergocalciferol, (DRISDOL) 50000 units CAPS capsule Take 1 capsule (50,000 Units total) by mouth once a week. For 12 weeks (Patient taking differently: Take 50,000 Units by mouth every Monday. For 12 weeks) 12 capsule 0   No current facility-administered medications for this visit.     Patient confirms/reports the following allergies:  Allergies  Allergen Reactions  . Avodart  [Dutasteride] Itching  . Macrolides And Ketolides Other (See  Comments)    Due to prolonged Q-T syndrome  . Norvasc  [Amlodipine]     bradycardia    No orders of the defined types were placed in this encounter.   AUTHORIZATION INFORMATION Primary Insurance: 1D#: Group #:  Secondary Insurance: 1D#: Group #:  SCHEDULE INFORMATION: Date: 9/7  Time: Location: [facility]

## 2017-08-08 NOTE — Progress Notes (Signed)
Patients wife notified

## 2017-08-14 ENCOUNTER — Encounter: Payer: Self-pay | Admitting: *Deleted

## 2017-08-14 NOTE — Anesthesia Preprocedure Evaluation (Addendum)
Anesthesia Evaluation  Patient identified by MRN, date of birth, ID band Patient awake    Reviewed: Allergy & Precautions, H&P , NPO status , Patient's Chart, lab work & pertinent test results, reviewed documented beta blocker date and time   History of Anesthesia Complications (+) PONV and history of anesthetic complications  Airway Mallampati: II  TM Distance: >3 FB Neck ROM: full    Dental no notable dental hx.    Pulmonary former smoker,  States that he wheezes every once in a while.  No inhaler use    Pulmonary exam normal breath sounds clear to auscultation       Cardiovascular Exercise Tolerance: Good + CAD and + Peripheral Vascular Disease (thoracic aortic aneurysm)  Normal cardiovascular exam Rhythm:regular Rate:Normal  Nonischemic cardiomyopathy  L/RHC(06/13/17): LMCA normal. 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 Ramus unremarkable. Normal LCx. RCA with 40% proximal and 30% distal lesions. Proximal RPL with 10% stenosis. RA 14, RV 40/15, PA 40/20 (27), and PCWP 20. AO sat 97%, PA sat 73%, RA sat 75%. Fick CO/CI 6.6/2.9. PVR 1.1. LVEDP 20-23.  Non-Invasive Evaluation(s): TTE (05/29/17): Normal LV size with mild LVH. LVEF 40-45% with grade 1 diastolic dysfunction. Mild AI. Mildly dilated aorta, with root measuring 3.8 cm and descending aorta 4.2 cm. Mitral regurgitation. Mild left atrial enlargement. Mildly dilated RV with normal wall thickness. Moderately enlarged right atrium. Normal PA pressure.   Neuro/Psych negative neurological ROS  negative psych ROS   GI/Hepatic negative GI ROS, Neg liver ROS,   Endo/Other  negative endocrine ROS  Renal/GU negative Renal ROS  negative genitourinary   Musculoskeletal   Abdominal   Peds  Hematology negative hematology ROS (+)   Anesthesia Other Findings From cardiology note 07/02/17:  ASSESSMENT AND PLAN: Nonischemic cardiomyopathy Mr. Brandstetter appears  euvolemic and well compensated on exam today with NYHA class I heart failure symptoms. He is tolerating furosemide and lisinopril well. We will check a basic metabolic panel today to reassess his renal function and potassium. We will continue his current regimen; I am hesitant to up titrate lisinopril at this time given borderline low blood pressure and occasional orthostatic lightheadedness. I encouraged Mr. Calk to stay well-hydrated.  Nonobstructive coronary artery disease No angina or other symptoms to suggest worsening coronary insufficiency. We will continue simvastatin, given LDL earlier this year of 90.  Thoracic aortic aneurysm Mildly dilated ascending aorta noted on prior echo. No symptoms reported by patient. We will discuss cross-sectional imaging versus repeat echo when he follows up.  Follow-up: Return to clinic in 3-4 months.  [name], MD 07/02/2017  Reproductive/Obstetrics negative OB ROS                            Anesthesia Physical Anesthesia Plan  ASA: III  Anesthesia Plan: General   Post-op Pain Management:    Induction: Intravenous  PONV Risk Score and Plan: 1 and Ondansetron and Propofol infusion  Airway Management Planned: Natural Airway  Additional Equipment:   Intra-op Plan:   Post-operative Plan:   Informed Consent: I have reviewed the patients History and Physical, chart, labs and discussed the procedure including the risks, benefits and alternatives for the proposed anesthesia with the patient or authorized representative who has indicated his/her understanding and acceptance.   Dental Advisory Given  Plan Discussed with: CRNA and Anesthesiologist  Anesthesia Plan Comments:       Anesthesia Quick Evaluation

## 2017-08-15 NOTE — Discharge Instructions (Signed)
General Anesthesia, Adult, Care After °These instructions provide you with information about caring for yourself after your procedure. Your health care provider may also give you more specific instructions. Your treatment has been planned according to current medical practices, but problems sometimes occur. Call your health care provider if you have any problems or questions after your procedure. °What can I expect after the procedure? °After the procedure, it is common to have: °· Vomiting. °· A sore throat. °· Mental slowness. ° °It is common to feel: °· Nauseous. °· Cold or shivery. °· Sleepy. °· Tired. °· Sore or achy, even in parts of your body where you did not have surgery. ° °Follow these instructions at home: °For at least 24 hours after the procedure: °· Do not: °? Participate in activities where you could fall or become injured. °? Drive. °? Use heavy machinery. °? Drink alcohol. °? Take sleeping pills or medicines that cause drowsiness. °? Make important decisions or sign legal documents. °? Take care of children on your own. °· Rest. °Eating and drinking °· If you vomit, drink water, juice, or soup when you can drink without vomiting. °· Drink enough fluid to keep your urine clear or pale yellow. °· Make sure you have little or no nausea before eating solid foods. °· Follow the diet recommended by your health care provider. °General instructions °· Have a responsible adult stay with you until you are awake and alert. °· Return to your normal activities as told by your health care provider. Ask your health care provider what activities are safe for you. °· Take over-the-counter and prescription medicines only as told by your health care provider. °· If you smoke, do not smoke without supervision. °· Keep all follow-up visits as told by your health care provider. This is important. °Contact a health care provider if: °· You continue to have nausea or vomiting at home, and medicines are not helpful. °· You  cannot drink fluids or start eating again. °· You cannot urinate after 8-12 hours. °· You develop a skin rash. °· You have fever. °· You have increasing redness at the site of your procedure. °Get help right away if: °· You have difficulty breathing. °· You have chest pain. °· You have unexpected bleeding. °· You feel that you are having a life-threatening or urgent problem. °This information is not intended to replace advice given to you by your health care provider. Make sure you discuss any questions you have with your health care provider. °Document Released: 03/10/2001 Document Revised: 05/06/2016 Document Reviewed: 11/16/2015 °Elsevier Interactive Patient Education © 2018 [employer] ° °

## 2017-08-22 ENCOUNTER — Ambulatory Visit
Admission: RE | Admit: 2017-08-22 | Discharge: 2017-08-22 | Disposition: A | Discharge status: Home / Self Care | Payer: Medicare HMO | Source: Ambulatory Visit | Attending: Gastroenterology | Admitting: Gastroenterology

## 2017-08-22 ENCOUNTER — Encounter
Admission: RE | Disposition: A | Discharge status: Home / Self Care | Payer: Self-pay | Source: Ambulatory Visit | Attending: Gastroenterology

## 2017-08-22 ENCOUNTER — Ambulatory Visit: Payer: Medicare HMO | Admitting: Anesthesiology

## 2017-08-22 DIAGNOSIS — I712 Thoracic aortic aneurysm, without rupture: Secondary | ICD-10-CM | POA: Insufficient documentation

## 2017-08-22 DIAGNOSIS — R195 Other fecal abnormalities: Secondary | ICD-10-CM | POA: Diagnosis not present

## 2017-08-22 DIAGNOSIS — Z7982 Long term (current) use of aspirin: Secondary | ICD-10-CM | POA: Diagnosis not present

## 2017-08-22 DIAGNOSIS — Z79899 Other long term (current) drug therapy: Secondary | ICD-10-CM | POA: Diagnosis not present

## 2017-08-22 DIAGNOSIS — Z85828 Personal history of other malignant neoplasm of skin: Secondary | ICD-10-CM | POA: Diagnosis not present

## 2017-08-22 DIAGNOSIS — D124 Benign neoplasm of descending colon: Secondary | ICD-10-CM | POA: Diagnosis not present

## 2017-08-22 DIAGNOSIS — D128 Benign neoplasm of rectum: Secondary | ICD-10-CM | POA: Diagnosis not present

## 2017-08-22 DIAGNOSIS — I428 Other cardiomyopathies: Secondary | ICD-10-CM | POA: Diagnosis not present

## 2017-08-22 DIAGNOSIS — K621 Rectal polyp: Secondary | ICD-10-CM

## 2017-08-22 DIAGNOSIS — I2510 Atherosclerotic heart disease of native coronary artery without angina pectoris: Secondary | ICD-10-CM | POA: Insufficient documentation

## 2017-08-22 DIAGNOSIS — D122 Benign neoplasm of ascending colon: Secondary | ICD-10-CM | POA: Diagnosis not present

## 2017-08-22 DIAGNOSIS — Z87891 Personal history of nicotine dependence: Secondary | ICD-10-CM | POA: Diagnosis not present

## 2017-08-22 DIAGNOSIS — Z888 Allergy status to other drugs, medicaments and biological substances status: Secondary | ICD-10-CM | POA: Insufficient documentation

## 2017-08-22 DIAGNOSIS — M1990 Unspecified osteoarthritis, unspecified site: Secondary | ICD-10-CM | POA: Insufficient documentation

## 2017-08-22 DIAGNOSIS — I739 Peripheral vascular disease, unspecified: Secondary | ICD-10-CM | POA: Insufficient documentation

## 2017-08-22 DIAGNOSIS — E785 Hyperlipidemia, unspecified: Secondary | ICD-10-CM | POA: Diagnosis not present

## 2017-08-22 HISTORY — PX: COLONOSCOPY WITH PROPOFOL: SHX5780

## 2017-08-22 HISTORY — PX: POLYPECTOMY: SHX5525

## 2017-08-22 HISTORY — DX: Presence of dental prosthetic device (complete) (partial): Z97.2

## 2017-08-22 SURGERY — COLONOSCOPY WITH PROPOFOL
Anesthesia: General | Wound class: Contaminated

## 2017-08-22 MED ORDER — LACTATED RINGERS IV SOLN
10.0000 mL/h | INTRAVENOUS | Status: DC
  Administered 2017-08-22: 10 mL/h via INTRAVENOUS

## 2017-08-22 MED ORDER — FENTANYL CITRATE (PF) 100 MCG/2ML IJ SOLN: 25.0000 ug | INTRAMUSCULAR | Status: DC | PRN

## 2017-08-22 MED ORDER — ACETAMINOPHEN 325 MG PO TABS: 325.0000 mg | ORAL_TABLET | ORAL | Status: DC | PRN

## 2017-08-22 MED ORDER — OXYCODONE HCL 5 MG PO TABS: 5.0000 mg | ORAL_TABLET | Freq: Once | ORAL | Status: DC | PRN

## 2017-08-22 MED ORDER — PROPOFOL 10 MG/ML IV BOLUS
INTRAVENOUS | Status: DC | PRN
  Administered 2017-08-22 (×2): 20 mg via INTRAVENOUS
  Administered 2017-08-22: 30 mg via INTRAVENOUS
  Administered 2017-08-22 (×4): 50 mg via INTRAVENOUS

## 2017-08-22 MED ORDER — LIDOCAINE HCL (CARDIAC) 20 MG/ML IV SOLN
INTRAVENOUS | Status: DC | PRN
  Administered 2017-08-22: 50 mg via INTRAVENOUS

## 2017-08-22 MED ORDER — SODIUM CHLORIDE 0.9 % IV SOLN: INTRAVENOUS | Status: DC

## 2017-08-22 MED ORDER — ACETAMINOPHEN 160 MG/5ML PO SOLN: 325.0000 mg | ORAL | Status: DC | PRN

## 2017-08-22 MED ORDER — MEPERIDINE HCL 25 MG/ML IJ SOLN: 6.2500 mg | INTRAMUSCULAR | Status: DC | PRN

## 2017-08-22 MED ORDER — STERILE WATER FOR IRRIGATION IR SOLN
Status: DC | PRN
  Administered 2017-08-22: 10:00:00

## 2017-08-22 MED ORDER — OXYCODONE HCL 5 MG/5ML PO SOLN: 5.0000 mg | Freq: Once | ORAL | Status: DC | PRN

## 2017-08-22 SURGICAL SUPPLY — 23 items
CANISTER SUCT 1200ML W/VALVE (MISCELLANEOUS) ×3 IMPLANT
CLIP HMST 235XBRD CATH ROT (MISCELLANEOUS) IMPLANT
CLIP RESOLUTION 360 11X235 (MISCELLANEOUS)
FCP ESCP3.2XJMB 240X2.8X (MISCELLANEOUS)
FORCEPS BIOP RAD 4 LRG CAP 4 (CUTTING FORCEPS) IMPLANT
FORCEPS BIOP RJ4 240 W/NDL (MISCELLANEOUS)
FORCEPS ESCP3.2XJMB 240X2.8X (MISCELLANEOUS) IMPLANT
GOWN CVR UNV OPN BCK APRN NK (MISCELLANEOUS) ×4 IMPLANT
GOWN ISOL THUMB LOOP REG UNIV (MISCELLANEOUS) ×6
INJECTOR VARIJECT VIN23 (MISCELLANEOUS) IMPLANT
KIT DEFENDO VALVE AND CONN (KITS) IMPLANT
KIT ENDO PROCEDURE OLY (KITS) ×3 IMPLANT
MARKER SPOT ENDO TATTOO 5ML (MISCELLANEOUS) IMPLANT
PAD GROUND ADULT SPLIT (MISCELLANEOUS) IMPLANT
PROBE APC STR FIRE (PROBE) IMPLANT
RETRIEVER NET ROTH 2.5X230 LF (MISCELLANEOUS) IMPLANT
SNARE SHORT THROW 13M SML OVAL (MISCELLANEOUS) ×1 IMPLANT
SNARE SHORT THROW 30M LRG OVAL (MISCELLANEOUS) IMPLANT
SNARE SNG USE RND 15MM (INSTRUMENTS) IMPLANT
SPOT EX ENDOSCOPIC TATTOO (MISCELLANEOUS)
TRAP ETRAP POLY (MISCELLANEOUS) ×3 IMPLANT
VARIJECT INJECTOR VIN23 (MISCELLANEOUS)
WATER STERILE IRR 250ML POUR (IV SOLUTION) ×3 IMPLANT

## 2017-08-22 NOTE — Anesthesia Postprocedure Evaluation (Signed)
Anesthesia Post Note  Patient: Anthony Mosley  Procedure(s) Performed: Procedure(s) (LRB): COLONOSCOPY WITH PROPOFOL (N/A) POLYPECTOMY  Patient location during evaluation: PACU Anesthesia Type: General Level of consciousness: awake and alert Pain management: pain level controlled Vital Signs Assessment: post-procedure vital signs reviewed and stable Respiratory status: spontaneous breathing, nonlabored ventilation, respiratory function stable and patient connected to nasal cannula oxygen Cardiovascular status: blood pressure returned to baseline and stable Postop Assessment: no signs of nausea or vomiting Anesthetic complications: no    Trecia Rogers

## 2017-08-22 NOTE — Anesthesia Procedure Notes (Signed)
Performed by: Jaaziah Schulke Pre-anesthesia Checklist: Patient identified, Emergency Drugs available, Suction available, Timeout performed and Patient being monitored Patient Re-evaluated:Patient Re-evaluated prior to induction Oxygen Delivery Method: Nasal cannula Placement Confirmation: positive ETCO2       

## 2017-08-22 NOTE — Op Note (Signed)
[facility] Patient Name: Anthony Mosley Procedure Date: 08/22/2017 10:15 AM MRN: [id number] Account #: [id number] Date of Birth: 11-27-1935 Admit Type: Outpatient Age: 82 Room: [facility] OR ROOM 01 Gender: Male Note Status: Finalized Procedure:            Colonoscopy Indications:          Positive Cologuard test Providers:            [name] MD, MD Referring MD:         Otila Back. Manuella Ghazi (Referring MD) Medicines:            Propofol per Anesthesia Complications:        No immediate complications. Procedure:            Pre-Anesthesia Assessment:                       - Prior to the procedure, a History and Physical was                        performed, and patient medications and allergies were                        reviewed. The patient's tolerance of previous                        anesthesia was also reviewed. The risks and benefits of                        the procedure and the sedation options and risks were                        discussed with the patient. All questions were                        answered, and informed consent was obtained. Prior                        Anticoagulants: The patient has taken no previous                        anticoagulant or antiplatelet agents. ASA Grade                        Assessment: II - A patient with mild systemic disease.                        After reviewing the risks and benefits, the patient was                        deemed in satisfactory condition to undergo the                        procedure.                       After obtaining informed consent, the colonoscope was                        passed under direct vision. Throughout the procedure,  the patient's blood pressure, pulse, and oxygen                        saturations were monitored continuously. The Olympus                        Colonoscope [id number]) was introduced through the   anus and advanced to the the cecum, identified by                        appendiceal orifice and ileocecal valve. The                        colonoscopy was performed without difficulty. The                        patient tolerated the procedure well. The quality of                        the bowel preparation was excellent. Findings:      The perianal and digital rectal examinations were normal.      Two sessile polyps were found in the ascending colon. The polyps were 3       to 4 mm in size. These polyps were removed with a cold snare. Resection       and retrieval were complete.      A 3 mm polyp was found in the descending colon. The polyp was sessile.       The polyp was removed with a cold snare. Resection and retrieval were       complete.      Two sessile polyps were found in the rectum. The polyps were 2 to 3 mm       in size. These polyps were removed with a cold snare. Resection and       retrieval were complete. Impression:           - Two 3 to 4 mm polyps in the ascending colon, removed                        with a cold snare. Resected and retrieved.                       - One 3 mm polyp in the descending colon, removed with                        a cold snare. Resected and retrieved.                       - Two 2 to 3 mm polyps in the rectum, removed with a                        cold snare. Resected and retrieved. Recommendation:       - Discharge patient to home.                       - Resume previous diet.                       - Continue present medications.                       -  Await pathology results. Procedure Code(s):    --- Professional ---                       [id number], Colonoscopy, flexible; with removal of tumor(s),                        polyp(s), or other lesion(s) by snare technique Diagnosis Code(s):    --- Professional ---                       R19.5, Other fecal abnormalities                       D12.2, Benign neoplasm of ascending colon                        K62.1, Rectal polyp                       D12.4, Benign neoplasm of descending colon CPT copyright 2016 American Medical Association. All rights reserved. The codes documented in this report are preliminary and upon coder review may  be revised to meet current compliance requirements. [name] MD, MD 08/22/2017 10:44:06 AM This report has been signed electronically. Number of Addenda: 0 Note Initiated On: 08/22/2017 10:15 AM Scope Withdrawal Time: 0 hours 10 minutes 34 seconds  Total Procedure Duration: 0 hours 17 minutes 7 seconds       [facility]

## 2017-08-22 NOTE — Transfer of Care (Signed)
Immediate Anesthesia Transfer of Care Note  Patient: Anthony Mosley  Procedure(s) Performed: Procedure(s): COLONOSCOPY WITH PROPOFOL (N/A) POLYPECTOMY  Patient Location: PACU  Anesthesia Type: General  Level of Consciousness: awake, alert  and patient cooperative  Airway and Oxygen Therapy: Patient Spontanous Breathing and Patient connected to supplemental oxygen  Post-op Assessment: Post-op Vital signs reviewed, Patient's Cardiovascular Status Stable, Respiratory Function Stable, Patent Airway and No signs of Nausea or vomiting  Post-op Vital Signs: Reviewed and stable  Complications: No apparent anesthesia complications

## 2017-08-22 NOTE — H&P (Signed)
Anthony Lame, MD [facility] [address] [place], [place] [address] Phone:[phone] Fax : [phone]  Primary Care Physician:  Roselee Nova, MD Primary Gastroenterologist:  Dr. Allen Norris  Pre-Procedure History & Physical: HPI:  Anthony Mosley is a 82 y.o. male is here for an colonoscopy.   Past Medical History:  Diagnosis Date  . Arthritis   . CAD (coronary artery disease)   . Cancer ([place])    SKIN  . Chronic knee pain   . Cough   . Edema    FEET/LEGS  . Hemorrhoids   . Hyperbilirubinemia   . Hyperlipidemia   . PONV (postoperative nausea and vomiting)    after first cataract  . Rosacea   . Wears dentures    partial upper  . Wheezing     Past Surgical History:  Procedure Laterality Date  . APPENDECTOMY    . CARDIAC CATHETERIZATION  2005  . CARDIAC CATHETERIZATION  2002  . CATARACT EXTRACTION W/PHACO Right 10/24/2015   Procedure: CATARACT EXTRACTION PHACO AND INTRAOCULAR LENS PLACEMENT (IOC);  Surgeon: [name], MD;  Location: [facility] ORS;  Service: Ophthalmology;  Laterality: Right;  [place] 00:52 AP% 23.5 CDE 12.35 fluid pack lot # [id number]  . CATARACT EXTRACTION W/PHACO Left 11/14/2015   Procedure: CATARACT EXTRACTION PHACO AND INTRAOCULAR LENS PLACEMENT (IOC);  Surgeon: [name], MD;  Location: [facility] ORS;  Service: Ophthalmology;  Laterality: Left;  [place] 00:55 AP% 19.1 CDE 10.66 fluid pack lot # [id number]  . COLONOSCOPY  1987  . HERNIA REPAIR    . KNEE ARTHROSCOPY    . RIGHT/LEFT HEART CATH AND CORONARY ANGIOGRAPHY N/A 06/13/2017   Procedure: Right/Left Heart Cath and Coronary Angiography;  Surgeon: [name], MD;  Location: [facility];  Service: Cardiovascular;  Laterality: N/A;  . TONSILLECTOMY      Prior to Admission medications   Medication Sig Start Date End Date Taking? Authorizing Provider  aspirin EC 81 MG tablet Take 81 mg by mouth daily.   Yes [provider]  B COMPLEX VITAMINS PO Take 1 tablet by mouth  daily.    Yes [provider]  Carboxymethylcellul-Glycerin (LUBRICATING EYE DROPS OP) Apply 1 drop to eye at bedtime.   Yes [provider]  furosemide (LASIX) 20 MG tablet Take 1 tablet (20 mg total) by mouth daily. 06/19/17 09/17/17 Yes End, Harrell Gave, MD  Guaifenesin [facility] MAXIMUM STRENGTH) 1200 MG TB12 Take 1,200 mg by mouth daily.   Yes [provider]  lisinopril (PRINIVIL,ZESTRIL) 2.5 MG tablet Take 1 tablet (2.5 mg total) by mouth daily. 06/19/17 09/17/17 Yes End, Harrell Gave, MD  simvastatin (ZOCOR) 40 MG tablet TAKE 1 TABLET EVERY DAY  AT  6PM 06/14/17  Yes Roselee Nova, MD    Allergies as of 08/08/2017 - Review Complete 07/09/2017  Allergen Reaction Noted  . Avodart  [dutasteride] Itching 10/04/2015  . Macrolides and ketolides Other (See Comments) 03/14/2015  . Norvasc  [amlodipine]  10/04/2015    Family History  Problem Relation Age of Onset  . Alzheimer's disease Mother   . Pulmonary embolism Father   . Diabetes Father   . Heart attack Father 59  . Diabetes Brother   . Heart disease Brother        CABG  . Heart disease Brother        CABG    Social History   Social History  . Marital status: Married    Spouse name: N/A  . Number of children:  N/A  . Years of education: N/A   Occupational History  . Not on file.   Social History Main Topics  . Smoking status: Former Smoker    Types: Cigarettes  . Smokeless tobacco: Former Systems developer    Types: Chew     Comment: quit > 30 yrs ago  . Alcohol use No  . Drug use: No  . Sexual activity: No   Other Topics Concern  . Not on file   Social History Narrative  . No narrative on file    Review of Systems: See HPI, otherwise negative ROS  Physical Exam: BP 117/76   Pulse 64   Temp 97.7 F (36.5 C) (Temporal)   Ht 6\' 1"  (1.854 m)   Wt 209 lb (94.8 kg)   SpO2 95%   BMI 27.57 kg/m  General:   Alert,  pleasant and cooperative in NAD Head:  Normocephalic and atraumatic. Neck:   Supple; no masses or thyromegaly. Lungs:  Clear throughout to auscultation.    Heart:  Regular rate and rhythm. Abdomen:  Soft, nontender and nondistended. Normal bowel sounds, without guarding, and without rebound.   Neurologic:  Alert and  oriented x4;  grossly normal neurologically.  Impression/Plan: Anthony Mosley is here for an colonoscopy to be performed for positive cologard  Risks, benefits, limitations, and alternatives regarding  colonoscopy have been reviewed with the patient.  Questions have been answered.  All parties agreeable.   Anthony Lame, MD  08/22/2017, 10:10 AM

## 2017-08-27 ENCOUNTER — Encounter: Payer: Self-pay | Admitting: Gastroenterology

## 2017-08-29 ENCOUNTER — Encounter: Payer: Self-pay | Admitting: Gastroenterology

## 2017-09-10 ENCOUNTER — Other Ambulatory Visit: Payer: Self-pay | Admitting: Family Medicine

## 2017-09-22 DIAGNOSIS — L570 Actinic keratosis: Secondary | ICD-10-CM | POA: Diagnosis not present

## 2017-09-22 DIAGNOSIS — L578 Other skin changes due to chronic exposure to nonionizing radiation: Secondary | ICD-10-CM | POA: Diagnosis not present

## 2017-09-22 DIAGNOSIS — D692 Other nonthrombocytopenic purpura: Secondary | ICD-10-CM | POA: Diagnosis not present

## 2017-09-22 DIAGNOSIS — L814 Other melanin hyperpigmentation: Secondary | ICD-10-CM | POA: Diagnosis not present

## 2017-09-22 DIAGNOSIS — Z85828 Personal history of other malignant neoplasm of skin: Secondary | ICD-10-CM | POA: Diagnosis not present

## 2017-09-22 DIAGNOSIS — Z1283 Encounter for screening for malignant neoplasm of skin: Secondary | ICD-10-CM | POA: Diagnosis not present

## 2017-09-22 DIAGNOSIS — L821 Other seborrheic keratosis: Secondary | ICD-10-CM | POA: Diagnosis not present

## 2017-09-22 DIAGNOSIS — D1800 Hemangioma unspecified site: Secondary | ICD-10-CM | POA: Diagnosis not present

## 2017-11-05 ENCOUNTER — Ambulatory Visit: Payer: Medicare HMO | Admitting: Internal Medicine

## 2017-11-05 ENCOUNTER — Encounter: Payer: Self-pay | Admitting: Internal Medicine

## 2017-11-05 VITALS — BP 96/52 | HR 55 | Ht 73.0 in | Wt 216.0 lb

## 2017-11-05 DIAGNOSIS — I2510 Atherosclerotic heart disease of native coronary artery without angina pectoris: Secondary | ICD-10-CM

## 2017-11-05 DIAGNOSIS — I712 Thoracic aortic aneurysm, without rupture, unspecified: Secondary | ICD-10-CM

## 2017-11-05 DIAGNOSIS — I428 Other cardiomyopathies: Secondary | ICD-10-CM

## 2017-11-05 DIAGNOSIS — E785 Hyperlipidemia, unspecified: Secondary | ICD-10-CM

## 2017-11-05 DIAGNOSIS — Z01810 Encounter for preprocedural cardiovascular examination: Secondary | ICD-10-CM

## 2017-11-05 MED ORDER — LISINOPRIL 2.5 MG PO TABS: 2.5000 mg | ORAL_TABLET | Freq: Every day | ORAL | 3 refills | Status: DC

## 2017-11-05 MED ORDER — FUROSEMIDE 20 MG PO TABS: 20.0000 mg | ORAL_TABLET | Freq: Every day | ORAL | 3 refills | Status: DC

## 2017-11-05 MED ORDER — SIMVASTATIN 40 MG PO TABS: ORAL_TABLET | ORAL | 3 refills | Status: DC

## 2017-11-05 NOTE — Progress Notes (Signed)
Follow-up Outpatient Visit Date: 11/05/2017  Primary Care Provider: Roselee Nova, MD [address] [place] [address] [place] [address]  Chief Complaint: Follow-up cardiomyopathy  HPI:  Anthony Mosley is a 82 y.o. year-old male with history of nonobstructive coronary artery disease and nonischemic cardiomyopathy with moderately reduced LVEF, who presents for follow-up of cardiomyopathy. I last saw him in July, at which time he was doing well following catheterization that showed non-obstructive CAD.  Today, Anthony Mosley reports that he has been doing well.  He denies chest pain, shortness of breath, palpitations, lightheadedness, orthopnea, and PND.  He has intermittent dependent edema as well as varicose veins in his ankles.  Overall, this has been stable.  Weight has also been relatively stable, after having lost a few pounds due to procedures this summer (catheterization and colonoscopy).  Anthony Mosley remains compliant with his medications.  He has not had any significant side effects.  He does not exercise regularly but remains active around the house without any limitations.  --------------------------------------------------------------------------------------------------  Cardiovascular History & Procedures: Cardiovascular Problems:  Abnormal EKG  Nonobstructive coronary artery disease  Mildly dilated ascending aorta.  Risk Factors:  Known coronary artery disease, male gender, and age greater than 85  Cath/PCI:  L/RHC(06/13/17): LMCA normal. 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 Ramus unremarkable. Normal LCx. RCA with 40% proximal and 30% distal lesions. Proximal RPL with 10% stenosis. RA 14, RV 40/15, PA 40/20 (27), and PCWP 20. AO sat 97%, PA sat 73%, RA sat 75%. Fick CO/CI 6.6/2.9. PVR 1.1. LVEDP 20-23.  LHC (07/20/04): LMCA normal. LAD with mild luminal irregularities. LCx without significant disease. Dominant RCA with luminal irregularities and ectasia  and accompanying sluggish flow. No fixed obstruction. LVEF 55%.  LHC (01/29/01): LMCA normal. 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 LCx normal. RCA with sequential 60% and 40% proximal and mid vessel stenoses. LVEF 65% with mild inferior hypokinesis.  CV Surgery:  None  EP Procedures and Devices:  None  Non-Invasive Evaluation(s):  TTE (05/29/17): Normal LV size with mild LVH. LVEF 40-45% with grade 1 diastolic dysfunction. Mild AI. Mildly dilated aorta, with root measuring 3.8 cm and ascending aorta 4.2 cm. Mitral regurgitation. Mild left atrial enlargement. Mildly dilated RV with normal wall thickness. Moderately enlarged right atrium. Normal PA pressure.  Exercise MPI (09/01/03): Normal study without ischemia or scar. LVEF 54%.  Recent CV Pertinent Labs: Lab Results  Component Value Date   CHOL 131 04/07/2017   CHOL 143 05/03/2016   HDL 49 04/07/2017   HDL 49 05/03/2016   LDLCALC 67 04/07/2017   LDLCALC 78 05/03/2016   TRIG 73 04/07/2017   CHOLHDL 2.7 04/07/2017   INR 0.97 06/03/2017   K 4.4 07/02/2017   BUN 22 07/02/2017   CREATININE 1.15 07/02/2017   CREATININE 1.22 (H) 04/07/2017    Past medical and surgical history were reviewed and updated in EPIC.  Current Meds  Medication Sig  . aspirin EC 81 MG tablet Take 81 mg by mouth daily.  . B COMPLEX VITAMINS PO Take 1 tablet by mouth daily.   . Carboxymethylcellul-Glycerin (LUBRICATING EYE DROPS OP) Apply 1 drop to eye at bedtime.  . furosemide (LASIX) 20 MG tablet Take 1 tablet (20 mg total) by mouth daily.  . Guaifenesin (MUCINEX MAXIMUM STRENGTH) 1200 MG TB12 Take 1,200 mg by mouth daily.  [name] lisinopril (PRINIVIL,ZESTRIL) 2.5 MG tablet Take 1 tablet (2.5 mg total) by mouth daily.  . simvastatin (  ZOCOR) 40 MG tablet TAKE 1 TABLET EVERY DAY  AT  6PM  . [DISCONTINUED] simvastatin (ZOCOR) 40 MG tablet TAKE 1 TABLET EVERY DAY  AT  6PM    Allergies: Avodart  [dutasteride];  Macrolides and ketolides; and Norvasc  [amlodipine]  Social History   Socioeconomic History  . Marital status: Married    Spouse name: Not on file  . Number of children: Not on file  . Years of education: Not on file  . Highest education level: Not on file  Social Needs  . Financial resource strain: Not on file  . Food insecurity - worry: Not on file  . Food insecurity - inability: Not on file  . Transportation needs - medical: Not on file  . Transportation needs - non-medical: Not on file  Occupational History  . Not on file  Tobacco Use  . Smoking status: Former Smoker    Types: Cigarettes  . Smokeless tobacco: Former Systems developer    Types: Chew  . Tobacco comment: quit > 30 yrs ago  Substance and Sexual Activity  . Alcohol use: No    Alcohol/week: 0.0 oz  . Drug use: No  . Sexual activity: No  Other Topics Concern  . Not on file  Social History Narrative  . Not on file    Family History  Problem Relation Age of Onset  . Alzheimer's disease Mother   . Pulmonary embolism Father   . Diabetes Father   . Heart attack Father 46  . Diabetes Brother   . Heart disease Brother        CABG  . Heart disease Brother        CABG    Review of Systems: Increased cough and sputum production over the last few days.  Otherwise, a 12-system review of systems was performed and was negative except as noted in the HPI.  --------------------------------------------------------------------------------------------------  Physical Exam: BP (!) 96/52 (BP Location: Left Arm, Patient Position: Sitting, Cuff Size: Normal)   Pulse (!) 55   Ht 6\' 1"  (1.854 m)   Wt 216 lb (98 kg)   BMI 28.50 kg/m   General: Overweight man, seated comfortably in the exam room. HEENT: No conjunctival pallor or scleral icterus. Moist mucous membranes.  OP clear. Neck: Supple without lymphadenopathy, thyromegaly, JVD, or HJR. No carotid bruit. Lungs: Normal work of breathing.  Scattered rhonchi bilaterally.  No  crackles. Heart: Bradycardic but regular without murmurs, rubs, or gallops. Non-displaced PMI. Abd: Bowel sounds present. Soft, NT/ND without hepatosplenomegaly Ext: Recent ankle edema with spider veins. Skin: Warm and dry without rash.  EKG: Sinus bradycardia (heart rate 55 bpm) with left axis deviation, nonspecific intraventricular conduction delay, and nonspecific T wave changes.  Lab Results  Component Value Date   WBC 8.3 06/03/2017   HGB 15.1 06/03/2017   HCT 44.1 06/03/2017   MCV 91.2 06/03/2017   PLT 167 06/03/2017    Lab Results  Component Value Date   NA 143 07/02/2017   K 4.4 07/02/2017   CL 102 07/02/2017   CO2 23 07/02/2017   BUN 22 07/02/2017   CREATININE 1.15 07/02/2017   GLUCOSE 90 07/02/2017   ALT 10 04/07/2017    Lab Results  Component Value Date   CHOL 131 04/07/2017   HDL 49 04/07/2017   LDLCALC 67 04/07/2017   TRIG 73 04/07/2017   CHOLHDL 2.7 04/07/2017   --------------------------------------------------------------------------------------------------  ASSESSMENT AND PLAN: Nonischemic cardiomyopathy Mr. Meschke remains asymptomatic and euvolemic (NYHA class I).  He is  tolerating low-dose lisinopril well.  His soft blood pressure and sinus bradycardia precludes up titration of lisinopril and addition of a beta-blocker.  We will continue his current dose of furosemide.  I have encouraged him to remain active and to limit his salt intake.  Nonobstructive coronary artery disease No symptoms to suggest worsening coronary insufficiency.  Mr. Fronczak will remain on aspirin and simvastatin.  We will recheck a fasting lipid panel and CMP in 6-7 months.  Thoracic aortic aneurysm Mild dilation of ascending aorta noted on echo this summer.  Mr. Shouse is asymptomatic.  We will obtain a CTA of the chest at his convenience to better characterize the a sending aortic aneurysm.  We will continue with blood pressure control and lipid  therapy.  Dyslipidemia LDL well controlled at last check in April.  We will continue with simvastatin and repeat a fasting lipid panel and CMP in the summer.  Follow-up: Return to clinic in 1 year.  [name], MD 11/05/2017 9:30 AM

## 2017-11-05 NOTE — Patient Instructions (Addendum)
Medication Instructions:  Your physician recommends that you continue on your current medications as directed. Please refer to the Current Medication list given to you today.   Labwork:  1- Your physician recommends that you return for lab work in: the first week of January within the week before your CT at the [facility]. [facility]) - Please go to the [facility]. You will check in at the front desk to the right as you walk into the atrium. Valet Parking is offered if needed.     2- Your physician recommends that you return for lab work in: June 2019. (CMP, LIPID).  - Please go to the [facility]. You will check in at the front desk to the right as you walk into the atrium. Valet Parking is offered if needed. - You will need to be fasting. DO NOT HAVE anything to eat or drink after midnight the morning of lab work. - No appointment needed.    Testing/Procedures: Non-Cardiac CT Angiography (CTA) of the chest, is a special type of CT scan that uses a computer to produce multi-dimensional views of major blood vessels throughout the body. In CT angiography, a contrast material is injected through an IV to help visualize the blood vessels  DATE/TIME: Your appointment is on December 23, 2016 (Tuesday) arrival time of 08:15 am, LOCATION: [facility] [address]   [address]   [place], [place] [address]  Preprocedural Instructions: Do not eat or drink 4 hours prior to CT.      Follow-Up: Your physician wants you to follow-up in: 12 MONTHS WITH DR END. You will receive a reminder letter in the mail two months in advance. If you don't receive a letter, please call our office to schedule the follow-up appointment.    If you need a refill on your cardiac medications before your next appointment, please call your pharmacy.     CT Scan A CT scan is a kind of X-ray. A CT scan makes pictures of the inside of your body. In  this procedure, the pictures will be taken in a large machine that has an opening (CT scanner). What happens before the procedure? Staying hydrated Follow instructions from your doctor about hydration, which may include:  Up to 2 hours before the procedure - you may continue to drink clear liquids. These include water, clear fruit juice, black coffee, and plain tea.  Eating and drinking restrictions Follow instructions from your doctor about eating and drinking, which may include:  24 hours before the procedure - stop drinking caffeinated drinks. These include energy drinks, tea, soda, coffee, and hot chocolate.  8 hours before the procedure - stop eating heavy meals or foods. These include meat, fried foods, or fatty foods.  6 hours before the procedure - stop eating light meals or foods. These include toast or cereal.  4 hours before the procedure - stop drinking milk or drinks that have milk in them.  4 hours before the procedure - stop drinking clear liquids.  General instructions  Take off any jewelry.  Ask your doctor about changing or stopping your normal medicines. This is important if you take diabetes medicines or blood thinners. What happens during the procedure?  You will lie on a table with your arms above your head.  An IV tube may be put into one of your veins.  Dye may be put into the IV tube. You may feel warm or have a metal  taste in your mouth.  The table you will be lying on will move into the CT scanner.  You will be able to see, hear, and talk to the person who is running the machine while you are in it. Follow that person's directions.  The machine will move around you to take pictures. Do not move.  When the machine is done taking pictures, it will be turned off.  The table will be moved out of the machine.  Your IV tube will be taken out. The procedure may vary among doctors and hospitals. What happens after the procedure?  It is up to you to  get your results. Ask when your results will be ready. Summary  A CT scan is a kind of X-ray.  A CT scan makes pictures of the inside of your body.  Follow instructions from your doctor about eating and drinking before the procedure.  You will be able to see, hear, and talk to the person who is running the machine while you are in it. Follow that person's directions. This information is not intended to replace advice given to you by your health care provider. Make sure you discuss any questions you have with your health care provider. Document Released: 02/28/2009 Document Revised: 12/19/2016 Document Reviewed: 12/19/2016 Elsevier Interactive Patient Education  2017 [employer].

## 2017-12-17 ENCOUNTER — Other Ambulatory Visit
Admission: RE | Admit: 2017-12-17 | Discharge: 2017-12-17 | Disposition: A | Discharge status: Home / Self Care | Payer: Medicare HMO | Source: Ambulatory Visit | Attending: Internal Medicine | Admitting: Internal Medicine

## 2017-12-17 DIAGNOSIS — Z01810 Encounter for preprocedural cardiovascular examination: Secondary | ICD-10-CM | POA: Diagnosis not present

## 2017-12-17 DIAGNOSIS — I428 Other cardiomyopathies: Secondary | ICD-10-CM | POA: Diagnosis not present

## 2017-12-17 DIAGNOSIS — I712 Thoracic aortic aneurysm, without rupture, unspecified: Secondary | ICD-10-CM

## 2017-12-17 LAB — BASIC METABOLIC PANEL
ANION GAP: 8 (ref 5–15)
BUN: 19 mg/dL (ref 6–20)
CHLORIDE: 102 mmol/L (ref 101–111)
CO2: 30 mmol/L (ref 22–32)
Calcium: 9 mg/dL (ref 8.9–10.3)
Creatinine, Ser: 1.29 mg/dL — ABNORMAL HIGH (ref 0.61–1.24)
GFR calc non Af Amer: 51 mL/min — ABNORMAL LOW (ref >60)
GFR, EST AFRICAN AMERICAN: 59 mL/min — AB (ref >60)
GLUCOSE: 104 mg/dL — AB (ref 65–99)
Potassium: 4.1 mmol/L (ref 3.5–5.1)
Sodium: 140 mmol/L (ref 135–145)

## 2017-12-23 ENCOUNTER — Ambulatory Visit: Payer: Medicare HMO

## 2017-12-23 ENCOUNTER — Ambulatory Visit
Admission: RE | Admit: 2017-12-23 | Discharge: 2017-12-23 | Disposition: A | Discharge status: Home / Self Care | Payer: Medicare HMO | Source: Ambulatory Visit | Attending: Internal Medicine | Admitting: Internal Medicine

## 2017-12-23 DIAGNOSIS — I2510 Atherosclerotic heart disease of native coronary artery without angina pectoris: Secondary | ICD-10-CM | POA: Diagnosis not present

## 2017-12-23 DIAGNOSIS — I712 Thoracic aortic aneurysm, without rupture, unspecified: Secondary | ICD-10-CM

## 2017-12-23 DIAGNOSIS — J439 Emphysema, unspecified: Secondary | ICD-10-CM | POA: Insufficient documentation

## 2017-12-23 DIAGNOSIS — I428 Other cardiomyopathies: Secondary | ICD-10-CM | POA: Diagnosis not present

## 2017-12-23 DIAGNOSIS — I700 Atherosclerosis of aorta: Secondary | ICD-10-CM | POA: Diagnosis not present

## 2017-12-23 MED ORDER — IOPAMIDOL (ISOVUE-370) INJECTION 76%
75.0000 mL | Freq: Once | INTRAVENOUS | Status: AC | PRN
  Administered 2017-12-23: 75 mL via INTRAVENOUS

## 2017-12-24 ENCOUNTER — Other Ambulatory Visit: Payer: Self-pay | Admitting: *Deleted

## 2017-12-24 DIAGNOSIS — I77819 Aortic ectasia, unspecified site: Secondary | ICD-10-CM

## 2017-12-31 ENCOUNTER — Ambulatory Visit: Admission: RE | Admit: 2017-12-31 | Payer: Medicare HMO | Source: Ambulatory Visit

## 2018-01-02 ENCOUNTER — Telehealth: Payer: Self-pay | Admitting: Internal Medicine

## 2018-01-02 NOTE — Telephone Encounter (Signed)
Per Mack Guise at [facility] st, pt cancelled CT Angio due to he has moved.

## 2018-01-02 NOTE — Telephone Encounter (Signed)
To Dr. Erby Pian as an FYI only.

## 2018-02-02 ENCOUNTER — Telehealth: Payer: Self-pay

## 2018-02-02 DIAGNOSIS — M170 Bilateral primary osteoarthritis of knee: Secondary | ICD-10-CM

## 2018-02-02 DIAGNOSIS — L989 Disorder of the skin and subcutaneous tissue, unspecified: Secondary | ICD-10-CM

## 2018-02-02 NOTE — Telephone Encounter (Signed)
Copied from [place]. Topic: Quick Communication - See Telephone Encounter >> Feb 02, 2018 11:45 AM Arletha Grippe wrote: CRM for notification. See Telephone encounter for:   02/02/18. Pt calling requesting 2 referrals  - referral for Dr Nicole Kindred @  [facility], pt has apt on 04/08  - referral for Dr Donato Schultz, orhto for cortisone injection in knee.  Please call asap, pt has running to do today Cb is [phone]

## 2018-02-02 NOTE — Telephone Encounter (Signed)
I called patient, as he is new to me Left message to get more information Would like reason for referrals, if these are new doctors, etc. I need reason / diagnosis for the referrals I'll try him tomorrow

## 2018-02-03 NOTE — Assessment & Plan Note (Signed)
Refer to derm  ?

## 2018-02-03 NOTE — Addendum Note (Signed)
Addended by: Conley Pawling, Satira Anis on: 02/03/2018 09:17 AM   Modules accepted: Orders

## 2018-02-03 NOTE — Assessment & Plan Note (Signed)
Refer to ortho.

## 2018-02-03 NOTE — Telephone Encounter (Signed)
He has skin spots on his face to be evaluated; freezing some places off every 6 months He did the treatment on his face a while back for sun damage  Needs to see ortho; was seen Dr. Marry Guan, now Dr. Abbey Chatters; needs another injection in both knees; both knees have had torn cartilages

## 2018-02-13 DIAGNOSIS — M25562 Pain in left knee: Secondary | ICD-10-CM | POA: Diagnosis not present

## 2018-02-13 DIAGNOSIS — M25561 Pain in right knee: Secondary | ICD-10-CM | POA: Diagnosis not present

## 2018-02-13 DIAGNOSIS — M172 Bilateral post-traumatic osteoarthritis of knee: Secondary | ICD-10-CM | POA: Diagnosis not present

## 2018-03-23 DIAGNOSIS — L570 Actinic keratosis: Secondary | ICD-10-CM | POA: Diagnosis not present

## 2018-03-23 DIAGNOSIS — D692 Other nonthrombocytopenic purpura: Secondary | ICD-10-CM | POA: Diagnosis not present

## 2018-03-23 DIAGNOSIS — L718 Other rosacea: Secondary | ICD-10-CM | POA: Diagnosis not present

## 2018-03-23 DIAGNOSIS — L578 Other skin changes due to chronic exposure to nonionizing radiation: Secondary | ICD-10-CM | POA: Diagnosis not present

## 2018-03-23 DIAGNOSIS — L821 Other seborrheic keratosis: Secondary | ICD-10-CM | POA: Diagnosis not present

## 2018-03-23 DIAGNOSIS — Z85828 Personal history of other malignant neoplasm of skin: Secondary | ICD-10-CM | POA: Diagnosis not present

## 2018-03-23 DIAGNOSIS — L820 Inflamed seborrheic keratosis: Secondary | ICD-10-CM | POA: Diagnosis not present

## 2018-04-09 ENCOUNTER — Ambulatory Visit: Payer: Self-pay | Admitting: Family Medicine

## 2018-05-12 ENCOUNTER — Ambulatory Visit (INDEPENDENT_AMBULATORY_CARE_PROVIDER_SITE_OTHER): Payer: Medicare HMO | Admitting: Family Medicine

## 2018-05-12 ENCOUNTER — Encounter: Payer: Self-pay | Admitting: Family Medicine

## 2018-05-12 VITALS — BP 104/70 | HR 67 | Temp 97.8°F | Resp 14 | Ht 73.0 in | Wt 205.0 lb

## 2018-05-12 DIAGNOSIS — I429 Cardiomyopathy, unspecified: Secondary | ICD-10-CM | POA: Diagnosis not present

## 2018-05-12 DIAGNOSIS — I999 Unspecified disorder of circulatory system: Secondary | ICD-10-CM

## 2018-05-12 DIAGNOSIS — N183 Chronic kidney disease, stage 3 unspecified: Secondary | ICD-10-CM

## 2018-05-12 DIAGNOSIS — E785 Hyperlipidemia, unspecified: Secondary | ICD-10-CM

## 2018-05-12 DIAGNOSIS — M1712 Unilateral primary osteoarthritis, left knee: Secondary | ICD-10-CM | POA: Diagnosis not present

## 2018-05-12 DIAGNOSIS — I712 Thoracic aortic aneurysm, without rupture, unspecified: Secondary | ICD-10-CM

## 2018-05-12 DIAGNOSIS — Z5181 Encounter for therapeutic drug level monitoring: Secondary | ICD-10-CM | POA: Diagnosis not present

## 2018-05-12 LAB — COMPLETE METABOLIC PANEL WITH GFR
AG RATIO: 2 (calc) (ref 1.0–2.5)
ALT: 10 U/L (ref 9–46)
AST: 17 U/L (ref 10–35)
Albumin: 4.5 g/dL (ref 3.6–5.1)
Alkaline phosphatase (APISO): 55 U/L (ref 40–115)
BILIRUBIN TOTAL: 1.6 mg/dL — AB (ref 0.2–1.2)
BUN/Creatinine Ratio: 18 (calc) (ref 6–22)
BUN: 24 mg/dL (ref 7–25)
CHLORIDE: 101 mmol/L (ref 98–110)
CO2: 30 mmol/L (ref 20–32)
Calcium: 9.2 mg/dL (ref 8.6–10.3)
Creat: 1.35 mg/dL — ABNORMAL HIGH (ref 0.70–1.11)
GFR, EST NON AFRICAN AMERICAN: 49 mL/min/{1.73_m2} — AB (ref >=60)
GFR, Est African American: 57 mL/min/{1.73_m2} — ABNORMAL LOW (ref >=60)
GLOBULIN: 2.3 g/dL (ref 1.9–3.7)
Glucose, Bld: 91 mg/dL (ref 65–139)
POTASSIUM: 4.4 mmol/L (ref 3.5–5.3)
SODIUM: 140 mmol/L (ref 135–146)
Total Protein: 6.8 g/dL (ref 6.1–8.1)

## 2018-05-12 LAB — LIPID PANEL
Cholesterol: 137 mg/dL (ref <200)
HDL: 49 mg/dL (ref >40)
LDL Cholesterol (Calc): 74 mg/dL (calc)
NON-HDL CHOLESTEROL (CALC): 88 mg/dL (ref <130)
TRIGLYCERIDES: 68 mg/dL (ref <150)
Total CHOL/HDL Ratio: 2.8 (calc) (ref <5.0)

## 2018-05-12 MED ORDER — DICLOFENAC SODIUM 1 % TD GEL: 4.0000 g | Freq: Four times a day (QID) | TRANSDERMAL | 2 refills | Status: AC

## 2018-05-12 NOTE — Assessment & Plan Note (Signed)
Check lipids; continue statin 

## 2018-05-12 NOTE — Assessment & Plan Note (Signed)
Continue ACE-I and furosemide; followed by cardiologist

## 2018-05-12 NOTE — Assessment & Plan Note (Signed)
Check liver and kidney function 

## 2018-05-12 NOTE — Assessment & Plan Note (Addendum)
Reviewed chest CT from Jan 2019; next scan due Jan 2020 and will be done through cardiologist's office; patient aware

## 2018-05-12 NOTE — Progress Notes (Signed)
BP 104/70   Pulse 67   Temp 97.8 F (36.6 C) (Oral)   Resp 14   Ht 6\' 1"  (1.854 m)   Wt 205 lb (93 kg)   SpO2 94%   BMI 27.05 kg/m    Subjective:    Patient ID: Anthony Mosley, male    DOB: May 07, 1935, 84 y.o.   MRN: [id number]  HPI: Anthony Mosley is a 83 y.o. male  Chief Complaint  Patient presents with  . Follow-up  . Knee Pain    left    HPI Patient is new to me; previous provider left our practice  He has knee problems; the left knee if bothering him; he'll step and has a sharp pain at times; feels like the bone from the kneecap going down is hurting; has already had a shot in the knee by ortho in February; [place] [facility]; no old injuries; he can hear it grind; they talked about possible knee replacement, and he was told "you'll know when you need one"; right knee bending outwards  He saw Dr. Saunders Revel, cardiologist; they did a catheterization; was told he had a weak heart; put him on a fluid pill and an ACE-I; his BP has always been good; he thinks he has circulation in both legs; also has varicose veins; he had a CT scan of his chest done January 2019 showing a 4.4 cm thoracic aortic aneurysm  CKD; avoiding oral NSAIDs, even for arthritis; fair water drinker  Hyperlipidemia; taking statin; no problems; does eat some fatty meats Lab Results  Component Value Date   CHOL 131 04/07/2017   HDL 49 04/07/2017   LDLCALC 67 04/07/2017   TRIG 73 04/07/2017   CHOLHDL 2.7 04/07/2017   Positive Cologuard in August noted on the chart; he was sent to the specialist, had colonoscopy; removed five polyps; next due in five years  Depression screen [facility] 2/9 05/12/2018 07/09/2017 04/07/2017 12/11/2016 09/04/2016  Decreased Interest 0 0 0 0 0  Down, Depressed, Hopeless 0 0 0 0 0  PHQ - 2 Score 0 0 0 0 0    Relevant past medical, surgical, family and social history reviewed Past Medical History:  Diagnosis Date  . Arthritis   . CAD (coronary artery disease)   .  Cancer ([place])    SKIN  . Chronic knee pain   . Cough   . Edema    FEET/LEGS  . Hemorrhoids   . Hyperbilirubinemia   . Hyperlipidemia   . PONV (postoperative nausea and vomiting)    after first cataract  . Rosacea   . Wears dentures    partial upper  . Wheezing    Past Surgical History:  Procedure Laterality Date  . APPENDECTOMY    . CARDIAC CATHETERIZATION  2005  . CARDIAC CATHETERIZATION  2002  . CATARACT EXTRACTION W/PHACO Right 10/24/2015   Procedure: CATARACT EXTRACTION PHACO AND INTRAOCULAR LENS PLACEMENT (IOC);  Surgeon: [name], MD;  Location: [facility];  Service: Ophthalmology;  Laterality: Right;  [place] 00:52 AP% 23.5 CDE 12.35 fluid pack lot # [id number]  . CATARACT EXTRACTION W/PHACO Left 11/14/2015   Procedure: CATARACT EXTRACTION PHACO AND INTRAOCULAR LENS PLACEMENT (IOC);  Surgeon: [name], MD;  Location: [facility];  Service: Ophthalmology;  Laterality: Left;  [place] 00:55 AP% 19.1 CDE 10.66 fluid pack lot # [id number]  . COLONOSCOPY  1987  . COLONOSCOPY WITH PROPOFOL N/A 08/22/2017   Procedure: COLONOSCOPY WITH PROPOFOL;  Surgeon: [name], MD;  Location:  [place];  Service: Gastroenterology;  Laterality: N/A;  . HERNIA REPAIR    . KNEE ARTHROSCOPY    . POLYPECTOMY  08/22/2017   Procedure: POLYPECTOMY;  Surgeon: [name], MD;  Location: [place];  Service: Gastroenterology;;  . RIGHT/LEFT HEART CATH AND CORONARY ANGIOGRAPHY N/A 06/13/2017   Procedure: Right/Left Heart Cath and Coronary Angiography;  Surgeon: [name], MD;  Location: [place] [facility];  Service: Cardiovascular;  Laterality: N/A;  . TONSILLECTOMY     Family History  Problem Relation Age of Onset  . Alzheimer's disease Mother   . Pulmonary embolism Father   . Diabetes Father   . Heart attack Father 11  . Diabetes Brother   . Heart disease Brother        CABG  . Heart disease Brother        CABG   Social History   Tobacco Use  . Smoking status:  Former Smoker    Types: Cigarettes  . Smokeless tobacco: Former Systems developer    Types: Chew  . Tobacco comment: quit > 30 yrs ago  Substance Use Topics  . Alcohol use: No    Alcohol/week: 0.0 oz  . Drug use: No    Interim medical history since last visit reviewed. Allergies and medications reviewed  Review of Systems Per HPI unless specifically indicated above     Objective:    BP 104/70   Pulse 67   Temp 97.8 F (36.6 C) (Oral)   Resp 14   Ht 6\' 1"  (1.854 m)   Wt 205 lb (93 kg)   SpO2 94%   BMI 27.05 kg/m   Wt Readings from Last 3 Encounters:  05/12/18 205 lb (93 kg)  11/05/17 216 lb (98 kg)  08/22/17 209 lb (94.8 kg)    Physical Exam  Constitutional: He appears well-developed and well-nourished. No distress.  HENT:  Head: Normocephalic and atraumatic.  Eyes: EOM are normal. No scleral icterus.  Neck: No thyromegaly present.  Cardiovascular: Normal rate and regular rhythm.  Pulmonary/Chest: Effort normal and breath sounds normal.  Abdominal: Soft. Bowel sounds are normal. He exhibits no distension.  Musculoskeletal: He exhibits no edema.       Left knee: He exhibits decreased range of motion and effusion.  Mild crepitus with active flexion and extension  Neurological: Coordination normal.  Skin: Skin is warm and dry. No pallor.  Reduced terminal hair growth on lower legs  Psychiatric: He has a normal mood and affect. His behavior is normal. Judgment and thought content normal.    Results for orders placed or performed during the hospital encounter of 32/95/18  Basic metabolic panel  Result Value Ref Range   Sodium 140 135 - 145 mmol/L   Potassium 4.1 3.5 - 5.1 mmol/L   Chloride 102 101 - 111 mmol/L   CO2 30 22 - 32 mmol/L   Glucose, Bld 104 (H) 65 - 99 mg/dL   BUN 19 6 - 20 mg/dL   Creatinine, Ser 1.29 (H) 0.61 - 1.24 mg/dL   Calcium 9.0 8.9 - 10.3 mg/dL   GFR calc non Af Amer 51 (L) >60 mL/min   GFR calc Af Amer 59 (L) >60 mL/min   Anion gap 8 5 - 15        Assessment & Plan:   Problem List Items Addressed This Visit      Cardiovascular and Mediastinum   Thoracic aortic aneurysm without rupture [facility])    Reviewed chest CT from Jan 2019;  next scan due Jan 2020 and will be done through cardiologist's office; patient aware      Relevant Orders   Lipid panel   Cardiomyopathy ([place]) - Primary    Continue ACE-I and furosemide; followed by cardiologist        Musculoskeletal and Integument   Osteoarthritis of left knee    Try topical; avoid oral NSAIDs; refer to ortho if needed        Genitourinary   Chronic kidney disease (CKD), stage III (moderate) (HCC)    Avoid oral NSAIDs; will use the topical NSAID for knee arthritis; try to increase water intake        Other   Medication monitoring encounter    Check liver and kidney function      Relevant Orders   COMPLETE METABOLIC PANEL WITH GFR   Dyslipidemia    Check lipids; continue statin      Relevant Orders   Lipid panel    Other Visit Diagnoses    Circulation problem       patient did not want to have ABIs done; he'll try to walk       Follow up plan: Return in about 3 months (around 08/12/2018).  An after-visit summary was printed and given to the patient at [place].  Please see the patient instructions which may contain other information and recommendations beyond what is mentioned above in the assessment and plan.  Meds ordered this encounter  Medications  . diclofenac sodium (VOLTAREN) 1 % GEL    Sig: Apply 4 g topically 4 (four) times daily. To the knee if needed    Dispense:  100 g    Refill:  2    Orders Placed This Encounter  Procedures  . COMPLETE METABOLIC PANEL WITH GFR  . Lipid panel

## 2018-05-12 NOTE — Patient Instructions (Signed)
Try the new topical agent on your knee(s) Let me know if you need to get a referral to go back to your orthopaedist

## 2018-05-12 NOTE — Assessment & Plan Note (Signed)
Try topical; avoid oral NSAIDs; refer to ortho if needed

## 2018-05-12 NOTE — Assessment & Plan Note (Signed)
Avoid oral NSAIDs; will use the topical NSAID for knee arthritis; try to increase water intake

## 2018-05-13 ENCOUNTER — Encounter: Payer: Self-pay | Admitting: Family Medicine

## 2018-05-15 ENCOUNTER — Other Ambulatory Visit: Payer: Self-pay | Admitting: Family Medicine

## 2018-05-15 ENCOUNTER — Ambulatory Visit (INDEPENDENT_AMBULATORY_CARE_PROVIDER_SITE_OTHER): Payer: Medicare HMO

## 2018-05-15 VITALS — BP 100/60 | HR 70 | Temp 97.7°F | Resp 12 | Ht 73.0 in | Wt 206.4 lb

## 2018-05-15 DIAGNOSIS — H539 Unspecified visual disturbance: Secondary | ICD-10-CM | POA: Diagnosis not present

## 2018-05-15 DIAGNOSIS — Z0000 Encounter for general adult medical examination without abnormal findings: Secondary | ICD-10-CM

## 2018-05-15 DIAGNOSIS — M1712 Unilateral primary osteoarthritis, left knee: Secondary | ICD-10-CM

## 2018-05-15 NOTE — Progress Notes (Signed)
Ortho referral back to [facility] per patient request

## 2018-05-15 NOTE — Patient Instructions (Addendum)
Anthony Mosley , Thank you for taking time to come for your Medicare Wellness Visit. I appreciate your ongoing commitment to your health goals. Please review the following plan we discussed and let me know if I can assist you in the future.   Screening recommendations/referrals: Colorectal Screening: No longer required Lung Cancer Screening: You do not qualify for this screening Hepatitis C Screening: You do not qualify for this screening  Vision and Dental Exams: Recommended annual ophthalmology exams for early detection of glaucoma and other disorders of the eye Recommended annual dental exams for proper oral hygiene  Vaccinations: Influenza vaccine: Up to date Pneumococcal vaccine: Up to date Tdap vaccine: Declined. Please call your insurance company to determine your out of pocket expense. You may also receive this vaccine at your local pharmacy or Health Dept. Shingles vaccine: Please call your insurance company to determine your out of pocket expense for the Shingrix vaccine. You may also receive this vaccine at your local pharmacy or Health Dept.  Advanced directives: Advance directive discussed with you today. I have provided a copy for you to complete at home and have notarized. Once this is complete please bring a copy in to our office so we can scan it into your chart.  Conditions/risks identified: Recommend to drink at least 6-8 8oz glasses of water per day.  Next appointment: Please schedule your Annual Wellness Visit with your Nurse Health Advisor in one year.  Preventive Care 37 Years and Older, Male Preventive care refers to lifestyle choices and visits with your health care provider that can promote health and wellness. What does preventive care include?  A yearly physical exam. This is also called an annual well check.  Dental exams once or twice a year.  Routine eye exams. Ask your health care provider how often you should have your eyes checked.  Personal lifestyle  choices, including:  Daily care of your teeth and gums.  Regular physical activity.  Eating a healthy diet.  Avoiding tobacco and drug use.  Limiting alcohol use.  Practicing safe sex.  Taking low doses of aspirin every day.  Taking vitamin and mineral supplements as recommended by your health care provider. What happens during an annual well check? The services and screenings done by your health care provider during your annual well check will depend on your age, overall health, lifestyle risk factors, and family history of disease. Counseling  Your health care provider may ask you questions about your:  Alcohol use.  Tobacco use.  Drug use.  Emotional well-being.  Home and relationship well-being.  Sexual activity.  Eating habits.  History of falls.  Memory and ability to understand (cognition).  Work and work Statistician. Screening  You may have the following tests or measurements:  Height, weight, and BMI.  Blood pressure.  Lipid and cholesterol levels. These may be checked every 5 years, or more frequently if you are over 72 years old.  Skin check.  Lung cancer screening. You may have this screening every year starting at age 59 if you have a 30-pack-year history of smoking and currently smoke or have quit within the past 15 years.  Fecal occult blood test (FOBT) of the stool. You may have this test every year starting at age 37.  Flexible sigmoidoscopy or colonoscopy. You may have a sigmoidoscopy every 5 years or a colonoscopy every 10 years starting at age 86.  Prostate cancer screening. Recommendations will vary depending on your family history and other risks.  Hepatitis C  blood test.  Hepatitis B blood test.  Sexually transmitted disease (STD) testing.  Diabetes screening. This is done by checking your blood sugar (glucose) after you have not eaten for a while (fasting). You may have this done every 1-3 years.  Abdominal aortic aneurysm  (AAA) screening. You may need this if you are a current or former smoker.  Osteoporosis. You may be screened starting at age 70 if you are at high risk. Talk with your health care provider about your test results, treatment options, and if necessary, the need for more tests. Vaccines  Your health care provider may recommend certain vaccines, such as:  Influenza vaccine. This is recommended every year.  Tetanus, diphtheria, and acellular pertussis (Tdap, Td) vaccine. You may need a Td booster every 10 years.  Zoster vaccine. You may need this after age 11.  Pneumococcal 13-valent conjugate (PCV13) vaccine. One dose is recommended after age 51.  Pneumococcal polysaccharide (PPSV23) vaccine. One dose is recommended after age 65. Talk to your health care provider about which screenings and vaccines you need and how often you need them. This information is not intended to replace advice given to you by your health care provider. Make sure you discuss any questions you have with your health care provider. Document Released: 12/29/2015 Document Revised: 08/21/2016 Document Reviewed: 10/03/2015 Elsevier Interactive Patient Education  2017 [place] Prevention in the Home Falls can cause injuries. They can happen to people of all ages. There are many things you can do to make your home safe and to help prevent falls. What can I do on the outside of my home?  Regularly fix the edges of walkways and driveways and fix any cracks.  Remove anything that might make you trip as you walk through a door, such as a raised step or threshold.  Trim any bushes or trees on the path to your home.  Use bright outdoor lighting.  Clear any walking paths of anything that might make someone trip, such as rocks or tools.  Regularly check to see if handrails are loose or broken. Make sure that both sides of any steps have handrails.  Any raised decks and porches should have guardrails on the  edges.  Have any leaves, snow, or ice cleared regularly.  Use sand or salt on walking paths during winter.  Clean up any spills in your garage right away. This includes oil or grease spills. What can I do in the bathroom?  Use night lights.  Install grab bars by the toilet and in the tub and shower. Do not use towel bars as grab bars.  Use non-skid mats or decals in the tub or shower.  If you need to sit down in the shower, use a plastic, non-slip stool.  Keep the floor dry. Clean up any water that spills on the floor as soon as it happens.  Remove soap buildup in the tub or shower regularly.  Attach bath mats securely with double-sided non-slip rug tape.  Do not have throw rugs and other things on the floor that can make you trip. What can I do in the bedroom?  Use night lights.  Make sure that you have a light by your bed that is easy to reach.  Do not use any sheets or blankets that are too big for your bed. They should not hang down onto the floor.  Have a firm chair that has side arms. You can use this for support while you get dressed.  Do not have throw rugs and other things on the floor that can make you trip. What can I do in the kitchen?  Clean up any spills right away.  Avoid walking on wet floors.  Keep items that you use a lot in easy-to-reach places.  If you need to reach something above you, use a strong step stool that has a grab bar.  Keep electrical cords out of the way.  Do not use floor polish or wax that makes floors slippery. If you must use wax, use non-skid floor wax.  Do not have throw rugs and other things on the floor that can make you trip. What can I do with my stairs?  Do not leave any items on the stairs.  Make sure that there are handrails on both sides of the stairs and use them. Fix handrails that are broken or loose. Make sure that handrails are as long as the stairways.  Check any carpeting to make sure that it is firmly  attached to the stairs. Fix any carpet that is loose or worn.  Avoid having throw rugs at the top or bottom of the stairs. If you do have throw rugs, attach them to the floor with carpet tape.  Make sure that you have a light switch at the top of the stairs and the bottom of the stairs. If you do not have them, ask someone to add them for you. What else can I do to help prevent falls?  Wear shoes that:  Do not have high heels.  Have rubber bottoms.  Are comfortable and fit you well.  Are closed at the toe. Do not wear sandals.  If you use a stepladder:  Make sure that it is fully opened. Do not climb a closed stepladder.  Make sure that both sides of the stepladder are locked into place.  Ask someone to hold it for you, if possible.  Clearly mark and make sure that you can see:  Any grab bars or handrails.  First and last steps.  Where the edge of each step is.  Use tools that help you move around (mobility aids) if they are needed. These include:  Canes.  Walkers.  Scooters.  Crutches.  Turn on the lights when you go into a dark area. Replace any light bulbs as soon as they burn out.  Set up your furniture so you have a clear path. Avoid moving your furniture around.  If any of your floors are uneven, fix them.  If there are any pets around you, be aware of where they are.  Review your medicines with your doctor. Some medicines can make you feel dizzy. This can increase your chance of falling. Ask your doctor what other things that you can do to help prevent falls. This information is not intended to replace advice given to you by your health care provider. Make sure you discuss any questions you have with your health care provider. Document Released: 09/28/2009 Document Revised: 05/09/2016 Document Reviewed: 01/06/2015 Elsevier Interactive Patient Education  2017 [employer].

## 2018-05-15 NOTE — Progress Notes (Signed)
Subjective:   Anthony Mosley is a 83 y.o. male who presents for Medicare Annual/Subsequent preventive examination.  Review of Systems:  N/A Cardiac Risk Factors include: advanced age (>6men, >66 women);dyslipidemia;male gender;sedentary lifestyle;hypertension     Objective:    Vitals: BP 100/60 (BP Location: Left Arm, Patient Position: Sitting, Cuff Size: Normal)   Pulse 70   Temp 97.7 F (36.5 C) (Oral)   Resp 12   Ht 6\' 1"  (1.854 m)   Wt 206 lb 6.4 oz (93.6 kg)   SpO2 92%   BMI 27.23 kg/m   Body mass index is 27.23 kg/m.  Advanced Directives 05/15/2018 08/22/2017 07/09/2017 06/13/2017 04/07/2017 12/11/2016 09/04/2016  Does Patient Have a Medical Advance Directive? No No No No No No No  Does patient want to make changes to medical advance directive? Yes (MAU/Ambulatory/Procedural Areas - Information given) - - - - - -  Would patient like information on creating a medical advance directive? - No - Patient declined - No - Patient declined No - Patient declined - No - patient declined information    Tobacco Social History   Tobacco Use  Smoking Status Former Smoker  . Packs/day: 1.50  . Years: 20.00  . Pack years: 30.00  . Types: Cigarettes  . Last attempt to quit: 1987  . Years since quitting: 32.4  Smokeless Tobacco Former Systems developer  . Types: Chew  . Quit date: 2000  Tobacco Comment   smoking cessation materials not required     Counseling given: No Comment: smoking cessation materials not required  Clinical Intake:  Pre-visit preparation completed: Yes  Pain : No/denies pain   BMI - recorded: 27.23 Nutritional Status: BMI 25 -29 Overweight Nutritional Risks: None Diabetes: No  How often do you need to have someone help you when you read instructions, pamphlets, or other written materials from your doctor or pharmacy?: 1 - Never  Interpreter Needed?: No  Information entered by :: AEversole, LPN  Past Medical History:  Diagnosis Date  . Arthritis   .  CAD (coronary artery disease)   . Cancer ([place])    SKIN  . Chronic knee pain   . Cough   . Edema    FEET/LEGS  . Hemorrhoids   . Hyperbilirubinemia   . Hyperlipidemia   . PONV (postoperative nausea and vomiting)    after first cataract  . Rosacea   . Wears dentures    partial upper  . Wheezing    Past Surgical History:  Procedure Laterality Date  . APPENDECTOMY    . CARDIAC CATHETERIZATION  2005  . CARDIAC CATHETERIZATION  2002  . CATARACT EXTRACTION W/PHACO Right 10/24/2015   Procedure: CATARACT EXTRACTION PHACO AND INTRAOCULAR LENS PLACEMENT (IOC);  Surgeon: [name], MD;  Location: [facility];  Service: Ophthalmology;  Laterality: Right;  [place] 00:52 AP% 23.5 CDE 12.35 fluid pack lot # [id number]  . CATARACT EXTRACTION W/PHACO Left 11/14/2015   Procedure: CATARACT EXTRACTION PHACO AND INTRAOCULAR LENS PLACEMENT (IOC);  Surgeon: [name], MD;  Location: [facility];  Service: Ophthalmology;  Laterality: Left;  [place] 00:55 AP% 19.1 CDE 10.66 fluid pack lot # [id number]  . COLONOSCOPY  1987  . COLONOSCOPY WITH PROPOFOL N/A 08/22/2017   Procedure: COLONOSCOPY WITH PROPOFOL;  Surgeon: [name], MD;  Location: [facility];  Service: Gastroenterology;  Laterality: N/A;  . HERNIA REPAIR    . KNEE ARTHROSCOPY    . POLYPECTOMY  08/22/2017   Procedure: POLYPECTOMY;  Surgeon: [name], MD;  Location:  [facility];  Service: Gastroenterology;;  . RIGHT/LEFT HEART CATH AND CORONARY ANGIOGRAPHY N/A 06/13/2017   Procedure: Right/Left Heart Cath and Coronary Angiography;  Surgeon: [name], MD;  Location: [place] [facility];  Service: Cardiovascular;  Laterality: N/A;  . TONSILLECTOMY     Family History  Problem Relation Age of Onset  . Alzheimer's disease Mother   . Pulmonary embolism Father   . Diabetes Father   . Heart attack Father 68  . Diabetes Brother   . Heart disease Brother        CABG  . Heart disease Brother        CABG  . Diabetes Brother    . Healthy Brother    Social History   Socioeconomic History  . Marital status: Married    Spouse name: Jeanett Schlein  . Number of children: 2  . Years of education: Not on file  . Highest education level: 7th grade  Occupational History  . Occupation: Retired  Scientific laboratory technician  . Financial resource strain: Not hard at all  . Food insecurity:    Worry: Never true    Inability: Never true  . Transportation needs:    Medical: No    Non-medical: No  Tobacco Use  . Smoking status: Former Smoker    Packs/day: 1.50    Years: 20.00    Pack years: 30.00    Types: Cigarettes    Last attempt to quit: 1987    Years since quitting: 32.4  . Smokeless tobacco: Former Systems developer    Types: Chew    Quit date: 2000  . Tobacco comment: smoking cessation materials not required  Substance and Sexual Activity  . Alcohol use: No    Alcohol/week: 0.0 oz  . Drug use: No  . Sexual activity: Never  Lifestyle  . Physical activity:    Days per week: 0 days    Minutes per session: 0 min  . Stress: Not at all  Relationships  . Social connections:    Talks on phone: Patient refused    Gets together: Patient refused    Attends religious service: Patient refused    Active member of club or organization: Patient refused    Attends meetings of clubs or organizations: Patient refused    Relationship status: Married  Other Topics Concern  . Not on file  Social History Narrative  . Not on file    Outpatient Encounter Medications as of 05/15/2018  Medication Sig  . aspirin EC 81 MG tablet Take 81 mg by mouth daily.  . B COMPLEX VITAMINS PO Take 1 tablet by mouth daily.   . diclofenac sodium (VOLTAREN) 1 % GEL Apply 4 g topically 4 (four) times daily. To the knee if needed  . furosemide (LASIX) 20 MG tablet Take 1 tablet (20 mg total) by mouth daily.  . Guaifenesin (MUCINEX MAXIMUM STRENGTH) 1200 MG TB12 Take 1,200 mg by mouth daily.  [name] lisinopril (PRINIVIL,ZESTRIL) 2.5 MG tablet Take 1 tablet (2.5 mg total)  by mouth daily.  . simvastatin (ZOCOR) 40 MG tablet TAKE 1 TABLET EVERY DAY  AT  6PM  . Carboxymethylcellul-Glycerin (LUBRICATING EYE DROPS OP) Apply 1 drop to eye as needed.    No facility-administered encounter medications on file as of 05/15/2018.     Activities of Daily Living In your present state of health, do you have any difficulty performing the following activities: 05/15/2018 05/12/2018  Hearing? N N  Comment hearing aids -  Vision? Y N  Comment  wears eyeglasses; unable to see out of L eye -  Difficulty concentrating or making decisions? Y N  Comment short term memory loss -  Walking or climbing stairs? Y N  Comment B knee pain -  Dressing or bathing? N N  Doing errands, shopping? N N  Preparing Food and eating ? N -  Comment partial upper dentures -  Using the Toilet? N -  In the past six months, have you accidently leaked urine? N -  Do you have problems with loss of bowel control? N -  Managing your Medications? N -  Managing your Finances? N -  Housekeeping or managing your Housekeeping? N -  Some recent data might be hidden    Patient Care Team: Lada, Satira Anis, MD as PCP - General (Family Medicine) Watt Climes, PA as Physician Assistant (Physician Assistant) [name], MD as Referring Physician (Ophthalmology) Brendolyn Patty, MD as Consulting Physician (Dermatology) End, Harrell Gave, MD as Consulting Physician (Cardiology)   Assessment:   This is a routine wellness examination for [place].  Exercise Activities and Dietary recommendations Current Exercise Habits: The patient does not participate in regular exercise at present, Exercise limited by: None identified  Goals    . DIET - INCREASE WATER INTAKE     Recommend to drink at least 6-8 8oz glasses of water per day.       Fall Risk Fall Risk  05/15/2018 05/12/2018 07/09/2017 04/07/2017 12/11/2016  Falls in the past year? No No No No No  Risk for fall due to : Impaired vision;Impaired balance/gait  - - - -  Risk for fall due to: Comment wears eyeglasses; unable to see out of L eye; B knee pain - - - -   FALL RISK PREVENTION PERTAINING TO HOME: Is your home free of loose throw rugs in walkways, pet beds, electrical cords, etc? Yes Is there adequate lighting in your home to reduce risk of falls?  Yes Are there stairs in or around your home WITH handrails? Yes  ASSISTIVE DEVICES UTILIZED TO PREVENT FALLS: Use of a cane, walker or w/c? No Grab bars in the bathroom? Yes  Shower chair or a place to sit while bathing? Yes An elevated toilet seat or a handicapped toilet? Yes  Timed Get Up and Go Performed: Yes. Pt ambulated 10 feet within 12 sec. Gait slow, steady and without the use of an assistive device. No intervention required at this time. Fall risk prevention has been discussed.  Community Resource Referral:  [facility] Referral not required at this time.   Depression Screen PHQ 2/9 Scores 05/15/2018 05/12/2018 07/09/2017 04/07/2017  PHQ - 2 Score 0 0 0 0  PHQ- 9 Score 2 - - -    Cognitive Function     6CIT Screen 05/15/2018 04/07/2017  What Year? 0 points 0 points  What month? 3 points 0 points  What time? 0 points 0 points  Count back from 20 0 points 0 points  Months in reverse 0 points 4 points  Repeat phrase 0 points 2 points  Total Score 3 6    Immunization History  Administered Date(s) Administered  . Influenza, High Dose Seasonal PF 08/23/2015, 09/16/2016, 10/15/2017  . Influenza, Seasonal, Injecte, Preservative Fre 08/27/2012  . Influenza,inj,Quad PF,6+ Mos 08/23/2013, 09/05/2014  . Pneumococcal Conjugate-13 11/17/2014  . Pneumococcal Polysaccharide-23 12/16/2008  . Zoster 06/13/2009    Qualifies for Shingles Vaccine? Yes. Zostavax completed 06/13/09. Due for Shingrix. Education has been provided regarding the importance of this  vaccine. Pt has been advised to call his/her insurance company to determine his out of pocket expense. Advised he may also  receive this vaccine at his local pharmacy or Health Dept. Verbalized acceptance and understanding.  Due for Tdap vaccine. Education has been provided regarding the importance of this vaccine. Pt has been advised he may receive this vaccine at his local pharmacy or Health Dept. Also advised to provide a copy of his vaccination record if he chooses to receive this vaccine at his local pharmacy. Verbalized acceptance and understanding.  Screening Tests Health Maintenance  Topic Date Due  . TETANUS/TDAP  12/16/2026 (Originally 11/28/1954)  . INFLUENZA VACCINE  07/16/2018  . PNA vac Low Risk Adult  Completed   Cancer Screenings: Lung: Low Dose CT Chest recommended if Age 50-80 years, 30 pack-year currently smoking OR have quit w/in 15years. Patient does not qualify. Colorectal: No loner required  Additional Screenings: Hepatitis C Screening: Does not qualify  Vision Screening: Is the patient up to date with their annual eye exam?  No. Pt states he see Dr. George Ina but has not been to his office in several years. Pt further states he is unable to ses out of his L eye. Education provided re: the impotance of annual ophthalmology exams for early detection of glaucoma and other disorders of the eye. Ophthalmology referral has been placed and a message has been sent to our referral coordinator for scheduling purposes. Pt is aware that he will receive a call from our office re: his appt. Dental Screening: Recommended annual dental exams for proper oral hygiene    Plan:  I have personally reviewed and addressed the Medicare Annual Wellness questionnaire and have noted the following in the patient's chart:  A. Medical and social history B. Use of alcohol, tobacco or illicit drugs  C. Current medications and supplements D. Functional ability and status E.  Nutritional status F.  Physical activity G. Advance directives H. List of other physicians I.  Hospitalizations, surgeries, and ER visits in  previous 12 months J.  [place] such as hearing and vision if needed, cognitive and depression L. Referrals and appointments  In addition, I have reviewed and discussed with patient certain preventive protocols, quality metrics, and best practice recommendations. A written personalized care plan for preventive services as well as general preventive health recommendations were provided to patient.  See attached scanned questionnaire for additional information.   Signed,  Aleatha Borer, LPN Nurse Health Advisor

## 2018-06-03 DIAGNOSIS — M172 Bilateral post-traumatic osteoarthritis of knee: Secondary | ICD-10-CM | POA: Diagnosis not present

## 2018-07-01 DIAGNOSIS — H43811 Vitreous degeneration, right eye: Secondary | ICD-10-CM | POA: Diagnosis not present

## 2018-08-24 ENCOUNTER — Ambulatory Visit (INDEPENDENT_AMBULATORY_CARE_PROVIDER_SITE_OTHER): Payer: Medicare HMO | Admitting: Family Medicine

## 2018-08-24 ENCOUNTER — Encounter: Payer: Self-pay | Admitting: Family Medicine

## 2018-08-24 VITALS — BP 128/64 | HR 60 | Temp 98.3°F | Ht 73.0 in | Wt 195.4 lb

## 2018-08-24 DIAGNOSIS — R634 Abnormal weight loss: Secondary | ICD-10-CM | POA: Diagnosis not present

## 2018-08-24 DIAGNOSIS — N183 Chronic kidney disease, stage 3 unspecified: Secondary | ICD-10-CM

## 2018-08-24 DIAGNOSIS — I712 Thoracic aortic aneurysm, without rupture, unspecified: Secondary | ICD-10-CM

## 2018-08-24 DIAGNOSIS — Z636 Dependent relative needing care at home: Secondary | ICD-10-CM

## 2018-08-24 DIAGNOSIS — R739 Hyperglycemia, unspecified: Secondary | ICD-10-CM | POA: Diagnosis not present

## 2018-08-24 DIAGNOSIS — Z5181 Encounter for therapeutic drug level monitoring: Secondary | ICD-10-CM | POA: Diagnosis not present

## 2018-08-24 DIAGNOSIS — Z23 Encounter for immunization: Secondary | ICD-10-CM

## 2018-08-24 DIAGNOSIS — M170 Bilateral primary osteoarthritis of knee: Secondary | ICD-10-CM

## 2018-08-24 DIAGNOSIS — I429 Cardiomyopathy, unspecified: Secondary | ICD-10-CM | POA: Diagnosis not present

## 2018-08-24 DIAGNOSIS — I2510 Atherosclerotic heart disease of native coronary artery without angina pectoris: Secondary | ICD-10-CM | POA: Diagnosis not present

## 2018-08-24 DIAGNOSIS — E785 Hyperlipidemia, unspecified: Secondary | ICD-10-CM

## 2018-08-24 NOTE — Assessment & Plan Note (Signed)
Check A1c. 

## 2018-08-24 NOTE — Patient Instructions (Addendum)
Let's get labs today If you have not heard anything from my staff in a week about any orders/referrals/studies from today, please contact us here to follow-up [phone] If you continue to lose weight, please let me know Please let neurologist know about her sundowning and see if her medicine can be adjusted

## 2018-08-24 NOTE — Assessment & Plan Note (Signed)
Asymptomatic; seeing cardiologist

## 2018-08-24 NOTE — Assessment & Plan Note (Signed)
Check liver and kidneys 
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
## 2018-08-24 NOTE — Assessment & Plan Note (Signed)
Monitored by specialist; goal LDL less than 70

## 2018-08-24 NOTE — Assessment & Plan Note (Signed)
Managed by ortho 

## 2018-08-24 NOTE — Assessment & Plan Note (Signed)
Check Cr, avoid NSAIDs 

## 2018-08-24 NOTE — Assessment & Plan Note (Signed)
Goal LDL less than 70 ideally, though he is 83 years old; limit saturated fats

## 2018-08-24 NOTE — Assessment & Plan Note (Signed)
Under the care of cardiologist; on ACE-I and diuretic

## 2018-08-25 LAB — CBC WITH DIFFERENTIAL/PLATELET
BASOS ABS: 69 {cells}/uL (ref 0–200)
Basophils Relative: 1.3 %
EOS PCT: 3 %
Eosinophils Absolute: 159 cells/uL (ref 15–500)
HCT: 40 % (ref 38.5–50.0)
Hemoglobin: 13.8 g/dL (ref 13.2–17.1)
Lymphs Abs: 1362 cells/uL (ref 850–3900)
MCH: 31.2 pg (ref 27.0–33.0)
MCHC: 34.5 g/dL (ref 32.0–36.0)
MCV: 90.3 fL (ref 80.0–100.0)
MONOS PCT: 9.1 %
MPV: 10.5 fL (ref 7.5–12.5)
NEUTROS PCT: 60.9 %
Neutro Abs: 3228 cells/uL (ref 1500–7800)
PLATELETS: 178 10*3/uL (ref 140–400)
RBC: 4.43 10*6/uL (ref 4.20–5.80)
RDW: 12.5 % (ref 11.0–15.0)
TOTAL LYMPHOCYTE: 25.7 %
WBC mixed population: 482 cells/uL (ref 200–950)
WBC: 5.3 10*3/uL (ref 3.8–10.8)

## 2018-08-25 LAB — COMPLETE METABOLIC PANEL WITH GFR
AG Ratio: 1.9 (calc) (ref 1.0–2.5)
ALKALINE PHOSPHATASE (APISO): 57 U/L (ref 40–115)
ALT: 10 U/L (ref 9–46)
AST: 20 U/L (ref 10–35)
Albumin: 4.1 g/dL (ref 3.6–5.1)
BUN/Creatinine Ratio: 13 (calc) (ref 6–22)
BUN: 19 mg/dL (ref 7–25)
CALCIUM: 9.2 mg/dL (ref 8.6–10.3)
CO2: 31 mmol/L (ref 20–32)
CREATININE: 1.41 mg/dL — AB (ref 0.70–1.11)
Chloride: 103 mmol/L (ref 98–110)
GFR, EST NON AFRICAN AMERICAN: 47 mL/min/{1.73_m2} — AB (ref >=60)
GFR, Est African American: 54 mL/min/{1.73_m2} — ABNORMAL LOW (ref >=60)
Globulin: 2.2 g/dL (calc) (ref 1.9–3.7)
Glucose, Bld: 85 mg/dL (ref 65–139)
POTASSIUM: 4.5 mmol/L (ref 3.5–5.3)
SODIUM: 142 mmol/L (ref 135–146)
Total Bilirubin: 1.2 mg/dL (ref 0.2–1.2)
Total Protein: 6.3 g/dL (ref 6.1–8.1)

## 2018-08-25 LAB — LIPID PANEL
CHOL/HDL RATIO: 2.9 (calc) (ref <5.0)
CHOLESTEROL: 132 mg/dL (ref <200)
HDL: 45 mg/dL (ref >40)
LDL Cholesterol (Calc): 68 mg/dL (calc)
NON-HDL CHOLESTEROL (CALC): 87 mg/dL (ref <130)
TRIGLYCERIDES: 104 mg/dL (ref <150)

## 2018-08-25 LAB — TSH: TSH: 2.75 m[IU]/L (ref 0.40–4.50)

## 2018-08-25 LAB — HEMOGLOBIN A1C
EAG (MMOL/L): 6.5 (calc)
HEMOGLOBIN A1C: 5.7 %{Hb} — AB (ref <5.7)
Mean Plasma Glucose: 117 (calc)

## 2018-08-27 ENCOUNTER — Encounter: Payer: Self-pay | Admitting: Family Medicine

## 2018-08-27 DIAGNOSIS — R7303 Prediabetes: Secondary | ICD-10-CM | POA: Insufficient documentation

## 2018-09-01 ENCOUNTER — Other Ambulatory Visit: Payer: Self-pay | Admitting: Internal Medicine

## 2018-09-16 ENCOUNTER — Other Ambulatory Visit: Payer: Self-pay | Admitting: *Deleted

## 2018-09-16 NOTE — Patient Outreach (Addendum)
[facility] Care Management  09/16/2018  Anthony Mosley 02/24/1935 [id number]  Return phone call to patient on 09/15/18, who left message requesting assistance with his wife. Per patient, his wife's dementia is worsening but has improved some lately. Per patient, he has taken her to see her doctor and was placed on medication.  This Education officer, museum discussed facility care, however per patient, he cannot afford it and they do not qualify for Medicaid. This Education officer, museum provided patient with the contact information for [facility] [phone] for additional resources to assist with his wife's care needs. Patient verbalized no additional needs at this time. This Education officer, museum will sign off at this time.   Sheralyn Boatman [facility] Care Management [phone]

## 2018-09-28 DIAGNOSIS — D692 Other nonthrombocytopenic purpura: Secondary | ICD-10-CM | POA: Diagnosis not present

## 2018-09-28 DIAGNOSIS — Z85828 Personal history of other malignant neoplasm of skin: Secondary | ICD-10-CM | POA: Diagnosis not present

## 2018-09-28 DIAGNOSIS — L718 Other rosacea: Secondary | ICD-10-CM | POA: Diagnosis not present

## 2018-09-28 DIAGNOSIS — L812 Freckles: Secondary | ICD-10-CM | POA: Diagnosis not present

## 2018-09-28 DIAGNOSIS — L578 Other skin changes due to chronic exposure to nonionizing radiation: Secondary | ICD-10-CM | POA: Diagnosis not present

## 2018-09-28 DIAGNOSIS — L570 Actinic keratosis: Secondary | ICD-10-CM | POA: Diagnosis not present

## 2018-10-05 DIAGNOSIS — S2242XA Multiple fractures of ribs, left side, initial encounter for closed fracture: Secondary | ICD-10-CM | POA: Diagnosis not present

## 2018-10-05 DIAGNOSIS — I959 Hypotension, unspecified: Secondary | ICD-10-CM | POA: Diagnosis not present

## 2018-10-05 DIAGNOSIS — R0781 Pleurodynia: Secondary | ICD-10-CM | POA: Diagnosis not present

## 2018-10-12 ENCOUNTER — Ambulatory Visit: Payer: Medicare HMO | Admitting: Family Medicine

## 2018-10-12 DIAGNOSIS — H1132 Conjunctival hemorrhage, left eye: Secondary | ICD-10-CM | POA: Diagnosis not present

## 2018-10-14 ENCOUNTER — Ambulatory Visit: Payer: Medicare HMO | Admitting: Family Medicine

## 2018-10-26 DIAGNOSIS — L570 Actinic keratosis: Secondary | ICD-10-CM | POA: Diagnosis not present

## 2018-10-26 DIAGNOSIS — L720 Epidermal cyst: Secondary | ICD-10-CM | POA: Diagnosis not present

## 2018-10-28 ENCOUNTER — Emergency Department: Payer: Medicare HMO

## 2018-10-28 ENCOUNTER — Emergency Department
Admission: EM | Admit: 2018-10-28 | Discharge: 2018-10-29 | Payer: Medicare HMO | Attending: Emergency Medicine | Admitting: Emergency Medicine

## 2018-10-28 ENCOUNTER — Encounter: Payer: Self-pay | Admitting: Emergency Medicine

## 2018-10-28 ENCOUNTER — Other Ambulatory Visit: Payer: Self-pay

## 2018-10-28 DIAGNOSIS — S2242XA Multiple fractures of ribs, left side, initial encounter for closed fracture: Secondary | ICD-10-CM | POA: Diagnosis not present

## 2018-10-28 DIAGNOSIS — I2510 Atherosclerotic heart disease of native coronary artery without angina pectoris: Secondary | ICD-10-CM | POA: Diagnosis not present

## 2018-10-28 DIAGNOSIS — Z85828 Personal history of other malignant neoplasm of skin: Secondary | ICD-10-CM | POA: Insufficient documentation

## 2018-10-28 DIAGNOSIS — N183 Chronic kidney disease, stage 3 (moderate): Secondary | ICD-10-CM | POA: Insufficient documentation

## 2018-10-28 DIAGNOSIS — R55 Syncope and collapse: Secondary | ICD-10-CM | POA: Diagnosis not present

## 2018-10-28 DIAGNOSIS — Z87891 Personal history of nicotine dependence: Secondary | ICD-10-CM | POA: Diagnosis not present

## 2018-10-28 DIAGNOSIS — R109 Unspecified abdominal pain: Secondary | ICD-10-CM

## 2018-10-28 DIAGNOSIS — D291 Benign neoplasm of prostate: Secondary | ICD-10-CM | POA: Insufficient documentation

## 2018-10-28 DIAGNOSIS — W19XXXA Unspecified fall, initial encounter: Secondary | ICD-10-CM | POA: Insufficient documentation

## 2018-10-28 DIAGNOSIS — Y929 Unspecified place or not applicable: Secondary | ICD-10-CM | POA: Insufficient documentation

## 2018-10-28 DIAGNOSIS — S01302A Unspecified open wound of left ear, initial encounter: Secondary | ICD-10-CM | POA: Insufficient documentation

## 2018-10-28 DIAGNOSIS — K661 Hemoperitoneum: Secondary | ICD-10-CM | POA: Diagnosis not present

## 2018-10-28 DIAGNOSIS — S0101XA Laceration without foreign body of scalp, initial encounter: Secondary | ICD-10-CM | POA: Insufficient documentation

## 2018-10-28 DIAGNOSIS — Y9389 Activity, other specified: Secondary | ICD-10-CM | POA: Diagnosis not present

## 2018-10-28 DIAGNOSIS — D735 Infarction of spleen: Secondary | ICD-10-CM | POA: Diagnosis not present

## 2018-10-28 DIAGNOSIS — Z7982 Long term (current) use of aspirin: Secondary | ICD-10-CM | POA: Insufficient documentation

## 2018-10-28 DIAGNOSIS — D122 Benign neoplasm of ascending colon: Secondary | ICD-10-CM | POA: Insufficient documentation

## 2018-10-28 DIAGNOSIS — S3609XA Other injury of spleen, initial encounter: Secondary | ICD-10-CM | POA: Insufficient documentation

## 2018-10-28 DIAGNOSIS — Z79899 Other long term (current) drug therapy: Secondary | ICD-10-CM | POA: Insufficient documentation

## 2018-10-28 DIAGNOSIS — Y999 Unspecified external cause status: Secondary | ICD-10-CM | POA: Diagnosis not present

## 2018-10-28 DIAGNOSIS — S0990XA Unspecified injury of head, initial encounter: Secondary | ICD-10-CM | POA: Diagnosis not present

## 2018-10-28 LAB — URINALYSIS, COMPLETE (UACMP) WITH MICROSCOPIC
Bacteria, UA: NONE SEEN
Bilirubin Urine: NEGATIVE
Glucose, UA: NEGATIVE mg/dL
Hgb urine dipstick: NEGATIVE
Ketones, ur: 5 mg/dL — AB
Leukocytes, UA: NEGATIVE
Nitrite: NEGATIVE
Protein, ur: 30 mg/dL — AB
SPECIFIC GRAVITY, URINE: 1.019 (ref 1.005–1.030)
SQUAMOUS EPITHELIAL / LPF: NONE SEEN (ref 0–5)
pH: 5 (ref 5.0–8.0)

## 2018-10-28 LAB — CBC
HCT: 37.9 % — ABNORMAL LOW (ref 39.0–52.0)
Hemoglobin: 12.6 g/dL — ABNORMAL LOW (ref 13.0–17.0)
MCH: 30.8 pg (ref 26.0–34.0)
MCHC: 33.2 g/dL (ref 30.0–36.0)
MCV: 92.7 fL (ref 80.0–100.0)
Platelets: 176 10*3/uL (ref 150–400)
RBC: 4.09 MIL/uL — ABNORMAL LOW (ref 4.22–5.81)
RDW: 12.7 % (ref 11.5–15.5)
WBC: 10.9 10*3/uL — ABNORMAL HIGH (ref 4.0–10.5)
nRBC: 0 % (ref 0.0–0.2)

## 2018-10-28 LAB — TROPONIN I: Troponin I: <0.03 ng/mL (ref <0.03)

## 2018-10-28 LAB — BASIC METABOLIC PANEL
Anion gap: 10 (ref 5–15)
BUN: 17 mg/dL (ref 8–23)
CALCIUM: 8.5 mg/dL — AB (ref 8.9–10.3)
CO2: 25 mmol/L (ref 22–32)
Chloride: 104 mmol/L (ref 98–111)
Creatinine, Ser: 1.36 mg/dL — ABNORMAL HIGH (ref 0.61–1.24)
GFR calc Af Amer: 55 mL/min — ABNORMAL LOW (ref >60)
GFR, EST NON AFRICAN AMERICAN: 48 mL/min — AB (ref >60)
GLUCOSE: 226 mg/dL — AB (ref 70–99)
Potassium: 3.4 mmol/L — ABNORMAL LOW (ref 3.5–5.1)
Sodium: 139 mmol/L (ref 135–145)

## 2018-10-28 LAB — CG4 I-STAT (LACTIC ACID): Lactic Acid, Venous: 1.39 mmol/L (ref 0.5–1.9)

## 2018-10-28 LAB — GLUCOSE, CAPILLARY: GLUCOSE-CAPILLARY: 137 mg/dL — AB (ref 70–99)

## 2018-10-28 LAB — PREPARE RBC (CROSSMATCH)

## 2018-10-28 LAB — ABO/RH: ABO/RH(D): AB POS

## 2018-10-28 MED ORDER — IOHEXOL 350 MG/ML SOLN
75.0000 mL | Freq: Once | INTRAVENOUS | Status: AC | PRN
  Administered 2018-10-28: 75 mL via INTRAVENOUS

## 2018-10-28 MED ORDER — SODIUM CHLORIDE 0.9 % IV SOLN: 10.0000 mL/h | Freq: Once | INTRAVENOUS | Status: DC

## 2018-10-28 MED ORDER — SODIUM CHLORIDE 0.9 % IV BOLUS
1000.0000 mL | Freq: Once | INTRAVENOUS | Status: AC
  Administered 2018-10-28: 1000 mL via INTRAVENOUS
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
## 2018-10-28 NOTE — ED Notes (Signed)
Pt reminded of urine sample

## 2018-10-28 NOTE — ED Notes (Signed)
Given warm blankets 

## 2018-10-28 NOTE — ED Triage Notes (Signed)
Pt in via POV with complaints of acute onset dizziness about an hour ago, falling, laceration noted to left posterior head.  Pt reports near syncope w/ fall, denies any blood thinners.  Pt hypotensive upon arrival.  Pt roomed at this time.  EDP made aware.

## 2018-10-28 NOTE — ED Notes (Signed)
ED Provider at bedside. 

## 2018-10-28 NOTE — ED Notes (Signed)
Pt back from ct at this time.

## 2018-10-28 NOTE — ED Notes (Signed)
Pending bed assignment, Dr. Hulen Skains accepted

## 2018-10-28 NOTE — ED Notes (Signed)
Pt given water at this time. MD verified.

## 2018-10-28 NOTE — ED Notes (Signed)
Laceration to posterior left head, skin tear to left ear. Bleeding controlled.  Pt alert and oriented X4, active, cooperative, pt in NAD. RR even and unlabored, color WNL.
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
## 2018-10-29 ENCOUNTER — Encounter (HOSPITAL_COMMUNITY): Payer: Self-pay | Admitting: *Deleted

## 2018-10-29 ENCOUNTER — Inpatient Hospital Stay (HOSPITAL_COMMUNITY)
Admission: AD | Admit: 2018-10-29 | Discharge: 2018-11-04 | DRG: 981 | Disposition: A | Discharge status: Home / Self Care | Payer: Medicare HMO | Source: Other Acute Inpatient Hospital | Attending: General Surgery | Admitting: General Surgery

## 2018-10-29 ENCOUNTER — Inpatient Hospital Stay (HOSPITAL_COMMUNITY): Payer: Medicare HMO

## 2018-10-29 DIAGNOSIS — Z7982 Long term (current) use of aspirin: Secondary | ICD-10-CM

## 2018-10-29 DIAGNOSIS — Z9841 Cataract extraction status, right eye: Secondary | ICD-10-CM

## 2018-10-29 DIAGNOSIS — E785 Hyperlipidemia, unspecified: Secondary | ICD-10-CM | POA: Diagnosis present

## 2018-10-29 DIAGNOSIS — S3609XA Other injury of spleen, initial encounter: Secondary | ICD-10-CM

## 2018-10-29 DIAGNOSIS — Z9842 Cataract extraction status, left eye: Secondary | ICD-10-CM | POA: Diagnosis not present

## 2018-10-29 DIAGNOSIS — D291 Benign neoplasm of prostate: Secondary | ICD-10-CM | POA: Diagnosis not present

## 2018-10-29 DIAGNOSIS — N179 Acute kidney failure, unspecified: Secondary | ICD-10-CM | POA: Diagnosis not present

## 2018-10-29 DIAGNOSIS — Z86010 Personal history of colonic polyps: Secondary | ICD-10-CM

## 2018-10-29 DIAGNOSIS — Z85828 Personal history of other malignant neoplasm of skin: Secondary | ICD-10-CM | POA: Diagnosis not present

## 2018-10-29 DIAGNOSIS — I2510 Atherosclerotic heart disease of native coronary artery without angina pectoris: Secondary | ICD-10-CM | POA: Diagnosis present

## 2018-10-29 DIAGNOSIS — S01302A Unspecified open wound of left ear, initial encounter: Secondary | ICD-10-CM | POA: Diagnosis not present

## 2018-10-29 DIAGNOSIS — Z87891 Personal history of nicotine dependence: Secondary | ICD-10-CM

## 2018-10-29 DIAGNOSIS — I428 Other cardiomyopathies: Secondary | ICD-10-CM | POA: Diagnosis present

## 2018-10-29 DIAGNOSIS — M25569 Pain in unspecified knee: Secondary | ICD-10-CM | POA: Diagnosis present

## 2018-10-29 DIAGNOSIS — Z23 Encounter for immunization: Secondary | ICD-10-CM | POA: Diagnosis not present

## 2018-10-29 DIAGNOSIS — S2242XA Multiple fractures of ribs, left side, initial encounter for closed fracture: Secondary | ICD-10-CM | POA: Diagnosis present

## 2018-10-29 DIAGNOSIS — S36039A Unspecified laceration of spleen, initial encounter: Secondary | ICD-10-CM | POA: Diagnosis not present

## 2018-10-29 DIAGNOSIS — R55 Syncope and collapse: Secondary | ICD-10-CM

## 2018-10-29 DIAGNOSIS — M1990 Unspecified osteoarthritis, unspecified site: Secondary | ICD-10-CM | POA: Diagnosis present

## 2018-10-29 DIAGNOSIS — Z79899 Other long term (current) drug therapy: Secondary | ICD-10-CM

## 2018-10-29 DIAGNOSIS — G8929 Other chronic pain: Secondary | ICD-10-CM | POA: Diagnosis present

## 2018-10-29 DIAGNOSIS — K661 Hemoperitoneum: Secondary | ICD-10-CM

## 2018-10-29 DIAGNOSIS — K668 Other specified disorders of peritoneum: Secondary | ICD-10-CM | POA: Diagnosis not present

## 2018-10-29 DIAGNOSIS — Z888 Allergy status to other drugs, medicaments and biological substances status: Secondary | ICD-10-CM

## 2018-10-29 DIAGNOSIS — D62 Acute posthemorrhagic anemia: Secondary | ICD-10-CM | POA: Diagnosis not present

## 2018-10-29 DIAGNOSIS — R000 Tachycardia, unspecified: Secondary | ICD-10-CM | POA: Diagnosis not present

## 2018-10-29 DIAGNOSIS — R571 Hypovolemic shock: Secondary | ICD-10-CM | POA: Diagnosis not present

## 2018-10-29 DIAGNOSIS — Z961 Presence of intraocular lens: Secondary | ICD-10-CM | POA: Diagnosis present

## 2018-10-29 DIAGNOSIS — I5022 Chronic systolic (congestive) heart failure: Secondary | ICD-10-CM | POA: Diagnosis present

## 2018-10-29 DIAGNOSIS — Y92410 Unspecified street and highway as the place of occurrence of the external cause: Secondary | ICD-10-CM | POA: Diagnosis not present

## 2018-10-29 DIAGNOSIS — K567 Ileus, unspecified: Secondary | ICD-10-CM | POA: Diagnosis not present

## 2018-10-29 DIAGNOSIS — D122 Benign neoplasm of ascending colon: Secondary | ICD-10-CM | POA: Diagnosis not present

## 2018-10-29 DIAGNOSIS — N182 Chronic kidney disease, stage 2 (mild): Secondary | ICD-10-CM | POA: Diagnosis present

## 2018-10-29 DIAGNOSIS — S0101XA Laceration without foreign body of scalp, initial encounter: Secondary | ICD-10-CM | POA: Diagnosis not present

## 2018-10-29 DIAGNOSIS — N183 Chronic kidney disease, stage 3 (moderate): Secondary | ICD-10-CM | POA: Diagnosis not present

## 2018-10-29 DIAGNOSIS — S36029A Unspecified contusion of spleen, initial encounter: Secondary | ICD-10-CM

## 2018-10-29 HISTORY — DX: Thoracic aortic aneurysm, without rupture: I71.2

## 2018-10-29 HISTORY — DX: Nonrheumatic aortic (valve) insufficiency: I35.1

## 2018-10-29 HISTORY — DX: Thoracic aortic aneurysm, without rupture, unspecified: I71.20

## 2018-10-29 HISTORY — DX: Other cardiomyopathies: I42.8

## 2018-10-29 HISTORY — PX: IR ANGIOGRAM VISCERAL SELECTIVE: IMG657

## 2018-10-29 HISTORY — DX: Nonrheumatic mitral (valve) insufficiency: I34.0

## 2018-10-29 HISTORY — DX: Emphysema, unspecified: J43.9

## 2018-10-29 HISTORY — PX: IR EMBO ART  VEN HEMORR LYMPH EXTRAV  INC GUIDE ROADMAPPING: IMG5450

## 2018-10-29 HISTORY — DX: Pulmonary hypertension, unspecified: I27.20

## 2018-10-29 HISTORY — PX: IR US GUIDE VASC ACCESS RIGHT: IMG2390

## 2018-10-29 HISTORY — DX: Personal history of nicotine dependence: Z87.891

## 2018-10-29 LAB — CBC
HEMATOCRIT: 29.7 % — AB (ref 39.0–52.0)
HEMATOCRIT: 29.4 % — AB (ref 39.0–52.0)
HEMOGLOBIN: 9.8 g/dL — AB (ref 13.0–17.0)
Hemoglobin: 9.8 g/dL — ABNORMAL LOW (ref 13.0–17.0)
MCH: 30.1 pg (ref 26.0–34.0)
MCH: 29.7 pg (ref 26.0–34.0)
MCHC: 33 g/dL (ref 30.0–36.0)
MCHC: 33.3 g/dL (ref 30.0–36.0)
MCV: 91.1 fL (ref 80.0–100.0)
MCV: 89.1 fL (ref 80.0–100.0)
NRBC: 0 % (ref 0.0–0.2)
NRBC: 0 % (ref 0.0–0.2)
Platelets: 167 10*3/uL (ref 150–400)
Platelets: 152 10*3/uL (ref 150–400)
RBC: 3.26 MIL/uL — ABNORMAL LOW (ref 4.22–5.81)
RBC: 3.3 MIL/uL — AB (ref 4.22–5.81)
RDW: 13.4 % (ref 11.5–15.5)
RDW: 14.6 % (ref 11.5–15.5)
WBC: 9.5 10*3/uL (ref 4.0–10.5)
WBC: 13.1 10*3/uL — ABNORMAL HIGH (ref 4.0–10.5)

## 2018-10-29 LAB — COMPREHENSIVE METABOLIC PANEL
ALK PHOS: 40 U/L (ref 38–126)
ALT: 16 U/L (ref 0–44)
AST: 25 U/L (ref 15–41)
Albumin: 3 g/dL — ABNORMAL LOW (ref 3.5–5.0)
Anion gap: 9 (ref 5–15)
BILIRUBIN TOTAL: 2 mg/dL — AB (ref 0.3–1.2)
BUN: 14 mg/dL (ref 8–23)
CHLORIDE: 107 mmol/L (ref 98–111)
CO2: 22 mmol/L (ref 22–32)
Calcium: 8.1 mg/dL — ABNORMAL LOW (ref 8.9–10.3)
Creatinine, Ser: 1.06 mg/dL (ref 0.61–1.24)
GFR calc Af Amer: >60 mL/min (ref >60)
GLUCOSE: 109 mg/dL — AB (ref 70–99)
POTASSIUM: 3.5 mmol/L (ref 3.5–5.1)
Sodium: 138 mmol/L (ref 135–145)
Total Protein: 5.3 g/dL — ABNORMAL LOW (ref 6.5–8.1)

## 2018-10-29 LAB — PREPARE FRESH FROZEN PLASMA

## 2018-10-29 LAB — CBC WITH DIFFERENTIAL/PLATELET
Abs Immature Granulocytes: 0.02 10*3/uL (ref 0.00–0.07)
Basophils Absolute: 0 10*3/uL (ref 0.0–0.1)
Basophils Relative: 1 %
EOS ABS: 0 10*3/uL (ref 0.0–0.5)
EOS PCT: 1 %
HEMATOCRIT: 34.6 % — AB (ref 39.0–52.0)
HEMOGLOBIN: 11.6 g/dL — AB (ref 13.0–17.0)
Immature Granulocytes: 0 %
LYMPHS PCT: 21 %
Lymphs Abs: 1.4 10*3/uL (ref 0.7–4.0)
MCH: 30.1 pg (ref 26.0–34.0)
MCHC: 33.5 g/dL (ref 30.0–36.0)
MCV: 89.9 fL (ref 80.0–100.0)
MONOS PCT: 8 %
Monocytes Absolute: 0.6 10*3/uL (ref 0.1–1.0)
NRBC: 0 % (ref 0.0–0.2)
Neutro Abs: 4.7 10*3/uL (ref 1.7–7.7)
Neutrophils Relative %: 69 %
Platelets: 145 10*3/uL — ABNORMAL LOW (ref 150–400)
RBC: 3.85 MIL/uL — ABNORMAL LOW (ref 4.22–5.81)
RDW: 13.2 % (ref 11.5–15.5)
WBC: 6.7 10*3/uL (ref 4.0–10.5)

## 2018-10-29 LAB — BPAM FFP
BLOOD PRODUCT EXPIRATION DATE: 201911182359
Blood Product Expiration Date: 201911182359
UNIT TYPE AND RH: 8400
UNIT TYPE AND RH: 8400

## 2018-10-29 LAB — PROTIME-INR
INR: 1.01
Prothrombin Time: 13.2 seconds (ref 11.4–15.2)

## 2018-10-29 LAB — PREPARE RBC (CROSSMATCH)

## 2018-10-29 LAB — MRSA PCR SCREENING: MRSA by PCR: NEGATIVE

## 2018-10-29 LAB — ABO/RH: ABO/RH(D): AB POS

## 2018-10-29 LAB — GLUCOSE, CAPILLARY: Glucose-Capillary: 142 mg/dL — ABNORMAL HIGH (ref 70–99)

## 2018-10-29 MED ORDER — FAMOTIDINE IN NACL 20-0.9 MG/50ML-% IV SOLN
20.0000 mg | Freq: Two times a day (BID) | INTRAVENOUS | Status: DC
  Administered 2018-10-29 – 2018-11-01 (×8): 20 mg via INTRAVENOUS
  Filled 2018-10-29 (×8): qty 50

## 2018-10-29 MED ORDER — PROMETHAZINE HCL 25 MG/ML IJ SOLN: 12.5000 mg | Freq: Four times a day (QID) | INTRAMUSCULAR | Status: DC | PRN

## 2018-10-29 MED ORDER — MIDAZOLAM HCL 2 MG/2ML IJ SOLN
INTRAMUSCULAR | Status: AC | PRN
  Administered 2018-10-29 (×3): 0.5 mg via INTRAVENOUS

## 2018-10-29 MED ORDER — ONDANSETRON HCL 4 MG/2ML IJ SOLN: 4.0000 mg | Freq: Four times a day (QID) | INTRAMUSCULAR | Status: DC | PRN

## 2018-10-29 MED ORDER — FENTANYL CITRATE (PF) 100 MCG/2ML IJ SOLN
INTRAMUSCULAR | Status: AC
  Filled 2018-10-29: qty 2

## 2018-10-29 MED ORDER — ACETAMINOPHEN 500 MG PO TABS
1000.0000 mg | ORAL_TABLET | Freq: Three times a day (TID) | ORAL | Status: DC
  Administered 2018-10-29 – 2018-11-04 (×17): 1000 mg via ORAL
  Filled 2018-10-29 (×19): qty 2

## 2018-10-29 MED ORDER — MIDAZOLAM HCL 2 MG/2ML IJ SOLN
INTRAMUSCULAR | Status: AC
  Filled 2018-10-29: qty 2

## 2018-10-29 MED ORDER — MORPHINE SULFATE (PF) 2 MG/ML IV SOLN: 1.0000 mg | INTRAVENOUS | Status: DC | PRN

## 2018-10-29 MED ORDER — SODIUM CHLORIDE 0.9% IV SOLUTION
Freq: Once | INTRAVENOUS | Status: AC
  Administered 2018-10-29: 15:00:00 via INTRAVENOUS

## 2018-10-29 MED ORDER — LIDOCAINE HCL (PF) 1 % IJ SOLN
INTRAMUSCULAR | Status: AC | PRN
  Administered 2018-10-29: 5 mL

## 2018-10-29 MED ORDER — FENTANYL CITRATE (PF) 100 MCG/2ML IJ SOLN
INTRAMUSCULAR | Status: AC | PRN
  Administered 2018-10-29 (×3): 25 ug via INTRAVENOUS

## 2018-10-29 MED ORDER — ALBUMIN HUMAN 25 % IV SOLN
12.5000 g | Freq: Once | INTRAVENOUS | Status: AC
  Administered 2018-10-29: 12.5 g via INTRAVENOUS
  Filled 2018-10-29: qty 50

## 2018-10-29 MED ORDER — SODIUM CHLORIDE 0.9% IV SOLUTION
Freq: Once | INTRAVENOUS | Status: AC
  Administered 2018-10-29: 09:00:00 via INTRAVENOUS

## 2018-10-29 MED ORDER — ALUM & MAG HYDROXIDE-SIMETH 200-200-20 MG/5ML PO SUSP: 30.0000 mL | Freq: Four times a day (QID) | ORAL | Status: DC | PRN

## 2018-10-29 MED ORDER — IOHEXOL 300 MG/ML  SOLN
150.0000 mL | Freq: Once | INTRAMUSCULAR | Status: AC | PRN
  Administered 2018-10-29: 75 mL via INTRAVENOUS

## 2018-10-29 MED ORDER — LIDOCAINE HCL 1 % IJ SOLN
INTRAMUSCULAR | Status: AC
  Filled 2018-10-29: qty 20

## 2018-10-29 MED ORDER — POTASSIUM CHLORIDE IN NACL 20-0.9 MEQ/L-% IV SOLN
INTRAVENOUS | Status: DC
  Administered 2018-10-29 – 2018-11-03 (×7): via INTRAVENOUS
  Filled 2018-10-29 (×7): qty 1000

## 2018-10-29 MED ORDER — METHOCARBAMOL 1000 MG/10ML IJ SOLN
500.0000 mg | Freq: Three times a day (TID) | INTRAVENOUS | Status: DC
  Administered 2018-10-29 – 2018-11-02 (×13): 500 mg via INTRAVENOUS
  Filled 2018-10-29 (×4): qty 5
  Filled 2018-10-29: qty 500
  Filled 2018-10-29 (×9): qty 5

## 2018-10-29 NOTE — Progress Notes (Addendum)
Notified Dr Grandville Silos that patient was diaphoretic and was complaining of being hot. Patients CBG 142 and temp 97.7. Patient has increase in respirations. Patients BP 84/50 (55). Order for 12.5 mg of 25% albumin IV. Dr Grandville Silos came to assess patient. Awaiting CBC results at this time.
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
## 2018-10-29 NOTE — Progress Notes (Signed)
Chaplain responded to spiritual care consult.  Pt interested in completing an Advanced Directive.  Chaplain met with pt and one of his sons and explained the process, leaving the document for their consideration.  Will follow up on Monday if pt is still here to see if they are ready to execute it.  Tamsen Snider [phone]

## 2018-10-29 NOTE — Progress Notes (Signed)
Patient ID: Anthony Mosley, male   DOB: 28-Jan-1935, 83 y.o.   MRN: [id number] Receiving 2u PRBC. CBC thereafter.  I spoke with him and his sister and again discussed possible need for splenectomy if Hb does not stabilize.  Georganna Skeans, MD, MPH, FACS Trauma: [phone] General Surgery: [phone]

## 2018-10-29 NOTE — Progress Notes (Signed)
Patient has received two units of FFP. Notified phlebotomist to come draw labs around 1500.

## 2018-10-29 NOTE — Progress Notes (Signed)
Patient ID: Anthony Mosley, male   DOB: May 28, 1935, 83 y.o.   MRN: [id number] Was feeling well today then felt bloated after eating clear tray. A little diaphoretic. CBC pending now. Depending on results, may need splenectomy. We discussed it with him.  Georganna Skeans, MD, MPH, FACS Trauma: [phone] General Surgery: [phone]

## 2018-10-29 NOTE — Progress Notes (Signed)
Patient ID: Anthony Mosley, male   DOB: Mar 19, 1935, 83 y.o.   MRN: [id number] Patient just admitted this morning s/p splenic rupture and syncopal episodes.  Patient feeling much better now that he has had a robaxin.  He denies nausea and really denies any pain currently.  We will give some clear liquids for today and see how he does with this.  He had an EKG this morning that revealed PACs and PVCs, but currently looks irregular with no P-wave.  Will recheck an EKG.  Otherwise rate controlled in the 70-90s.  Continue bedrest for now.  Follow cbcs.  Getting next unit of FFP.  [name] 10:07 AM 10/29/2018

## 2018-10-29 NOTE — Progress Notes (Signed)
Notified by patient that he had completed his meal and was feeling bad. He reports that he is feeling very full and the top of his abdomin is burning. Patient had ingested all of his beef broth, one cup of juice, jello, and half of the ice pop. He denies any nausea or vomiting but does endorse that he feels warm and clammy. All VS's are WNL's. Claiborne Billings, PA was notified. Patient's diet was changed to NPO but patient can have clear liquids in moderation from the floor stock. Will continue to monitor patient.

## 2018-10-29 NOTE — Procedures (Signed)
Splenic lac with delayed bleeding and hemoperitoneum  S/p splenic angio and proximal splenic artery coil embo  No comp Stable Full report in pacs

## 2018-10-29 NOTE — ED Notes (Signed)
EMTALA reviewed by this nurse prior to transfer. Appears complete at this time.

## 2018-10-29 NOTE — Progress Notes (Signed)
I spoke with Dr. Grandville Silos and with IR, Dr. Annamaria Boots about his case. Given his hgb down-trending and now receiving blood as well as tachycardia that started today, will plan to proceed with angio & embolizaiton of his spleen. I discussed all of this with the patient and his family. They would prefer [place] to give an attempt at embolization as opposed to going to surgery unless all other options exhausted. We discussed the potential for still needing surgery. All of their questions were answered.  Sharon Mt. Dema Severin, M.D. [facility]

## 2018-10-29 NOTE — Sedation Documentation (Addendum)
1st coil deployed at 2010 2nd @ 1813 3rd @  2016 4th @ 2018 5th @ 2020 6th @ 2022 7th @2025  8th @ 2027 9th @ 2030

## 2018-10-30 ENCOUNTER — Telehealth: Payer: Self-pay | Admitting: Family Medicine

## 2018-10-30 ENCOUNTER — Encounter (HOSPITAL_COMMUNITY): Payer: Self-pay | Admitting: Interventional Radiology

## 2018-10-30 LAB — TYPE AND SCREEN
ABO/RH(D): AB POS
ANTIBODY SCREEN: NEGATIVE
UNIT DIVISION: 0
Unit division: 0

## 2018-10-30 LAB — CBC
HCT: 25.5 % — ABNORMAL LOW (ref 39.0–52.0)
Hemoglobin: 8.6 g/dL — ABNORMAL LOW (ref 13.0–17.0)
MCH: 29.5 pg (ref 26.0–34.0)
MCHC: 33.7 g/dL (ref 30.0–36.0)
MCV: 87.3 fL (ref 80.0–100.0)
NRBC: 0 % (ref 0.0–0.2)
Platelets: 143 10*3/uL — ABNORMAL LOW (ref 150–400)
RBC: 2.92 MIL/uL — AB (ref 4.22–5.81)
RDW: 15.4 % (ref 11.5–15.5)
WBC: 12.2 10*3/uL — ABNORMAL HIGH (ref 4.0–10.5)

## 2018-10-30 LAB — BPAM RBC
Blood Product Expiration Date: 201912062359
Blood Product Expiration Date: 201912072359
ISSUE DATE / TIME: 201911132318
ISSUE DATE / TIME: 201911140043
Unit Type and Rh: 6200
Unit Type and Rh: 6200

## 2018-10-30 LAB — BPAM FFP
Blood Product Expiration Date: 201911192359
Blood Product Expiration Date: 201911192359
ISSUE DATE / TIME: 201911140923
ISSUE DATE / TIME: 201911141225
Unit Type and Rh: 8400
Unit Type and Rh: 8400

## 2018-10-30 LAB — BASIC METABOLIC PANEL
Anion gap: 9 (ref 5–15)
BUN: 23 mg/dL (ref 8–23)
CHLORIDE: 111 mmol/L (ref 98–111)
CO2: 17 mmol/L — ABNORMAL LOW (ref 22–32)
Calcium: 7.8 mg/dL — ABNORMAL LOW (ref 8.9–10.3)
Creatinine, Ser: 1.63 mg/dL — ABNORMAL HIGH (ref 0.61–1.24)
GFR calc non Af Amer: 38 mL/min — ABNORMAL LOW (ref >60)
GFR, EST AFRICAN AMERICAN: 44 mL/min — AB (ref >60)
Glucose, Bld: 152 mg/dL — ABNORMAL HIGH (ref 70–99)
Potassium: 4.1 mmol/L (ref 3.5–5.1)
SODIUM: 137 mmol/L (ref 135–145)

## 2018-10-30 LAB — PREPARE FRESH FROZEN PLASMA

## 2018-10-30 LAB — MAGNESIUM: MAGNESIUM: 1.5 mg/dL — AB (ref 1.7–2.4)

## 2018-10-30 MED ORDER — MAGNESIUM OXIDE 400 (241.3 MG) MG PO TABS
200.0000 mg | ORAL_TABLET | Freq: Two times a day (BID) | ORAL | Status: DC
  Administered 2018-10-30 – 2018-11-04 (×11): 200 mg via ORAL
  Filled 2018-10-30 (×11): qty 1

## 2018-10-30 MED ORDER — SODIUM CHLORIDE 0.9 % IV SOLN
Freq: Once | INTRAVENOUS | Status: AC
  Administered 2018-10-30: via INTRAVENOUS
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
## 2018-10-30 NOTE — Progress Notes (Signed)
Referring Physician(s): Dr Deland Pretty  Supervising Physician: Markus Daft  Patient Status:  [facility] - In-pt  Chief Complaint:  Splenic rupture  Subjective:  Splenic lac with delayed bleeding and hemoperitoneum S/p splenic angio and proximal splenic artery coil embo-- 11/14  Feeling well this am Denies pain or SOB Slept well Hg 8.6 this am  Allergies: Macrolides and ketolides; Norvasc [amlodipine]; and Avodart [dutasteride]  Medications: Prior to Admission medications   Medication Sig Start Date End Date Taking? Authorizing Provider  aspirin EC 81 MG tablet Take 81 mg by mouth daily.   Yes [provider]  B COMPLEX VITAMINS PO Take 1 tablet by mouth daily.    Yes [provider]  Carboxymethylcellul-Glycerin (LUBRICATING EYE DROPS OP) Apply 1 drop to eye as needed (dry eyes).    Yes [provider]  diclofenac sodium (VOLTAREN) 1 % GEL Apply 4 g topically 4 (four) times daily. To the knee if needed 05/12/18  Yes Lada, Satira Anis, MD  furosemide (LASIX) 20 MG tablet TAKE 1 TABLET EVERY DAY Patient taking differently: Take 20 mg by mouth daily.  09/02/18  Yes End, Harrell Gave, MD  Guaifenesin [facility] MAXIMUM STRENGTH) 1200 MG TB12 Take 1,200 mg by mouth daily.   Yes [provider]  lisinopril (PRINIVIL,ZESTRIL) 2.5 MG tablet TAKE 1 TABLET EVERY DAY Patient taking differently: Take 2.5 mg by mouth daily.  09/02/18  Yes End, Harrell Gave, MD  Pheniramine-PE-APAP [facility] COLD & SORE THROAT) 20-10-325 MG PACK Take 1 Package by mouth as needed (flu symptoms).   Yes [provider]  simvastatin (ZOCOR) 40 MG tablet TAKE 1 TABLET EVERY DAY  AT  6PM Patient taking differently: Take 40 mg by mouth every evening.  09/02/18  Yes End, Harrell Gave, MD     Vital Signs: BP (!) 108/58   Pulse (!) 53   Temp (!) 97.5 F (36.4 C) (Oral)   Resp (!) 30   Ht 6\' 1"  (1.854 m)   SpO2 98%   BMI 24.41 kg/m   Physical Exam  Constitutional: He is  oriented to person, place, and time.  Abdominal: Soft. Bowel sounds are normal.  Musculoskeletal: Normal range of motion.  Neurological: He is alert and oriented to person, place, and time.  Skin: Skin is warm and dry.  Right groin NT no bleeding No hematoma Rt foot 1+ pulses  Psychiatric: He has a normal mood and affect. His behavior is normal.  Vitals reviewed.   Imaging: Ct Head Wo Contrast  Result Date: 10/28/2018 CLINICAL DATA:  Syncope, fall EXAM: CT HEAD WITHOUT CONTRAST TECHNIQUE: Contiguous axial images were obtained from the base of the skull through the vertex without intravenous contrast. COMPARISON:  None. FINDINGS: Brain: No evidence of acute infarction, hemorrhage, extra-axial collection, ventriculomegaly, or mass effect. Generalized cerebral atrophy. Periventricular white matter low attenuation likely secondary to microangiopathy. Vascular: Cerebrovascular atherosclerotic calcifications are noted. Skull: Negative for fracture or focal lesion. Sinuses/Orbits: Visualized portions of the orbits are unremarkable. Visualized portions of the paranasal sinuses and mastoid air cells are unremarkable. Other: None. IMPRESSION: No acute intracranial pathology. Electronically Signed   By: Kathreen Devoid   On: 10/28/2018 15:50   Ir Angiogram Visceral Selective  Result Date: 10/30/2018 INDICATION: Left upper quadrant trauma, splenic laceration with delayed bleeding, anemia EXAM: ULTRASOUND GUIDANCE FOR VASCULAR ACCESS CELIAC AND SPLENIC CATHETERIZATIONS AND ANGIOGRAMS PROXIMAL SPLENIC COIL EMBOLIZATION MEDICATIONS: 1% LIDOCAINE LOCAL. ANESTHESIA/SEDATION: Moderate (conscious) sedation was employed during this procedure. A total of Versed 1.5 mg and  Fentanyl 75 mcg was administered intravenously. Moderate Sedation Time: 40 minutes. The patient's level of consciousness and vital signs were monitored continuously by radiology nursing throughout the procedure under my direct supervision. CONTRAST:   75 CC ISOVUE-300 FLUOROSCOPY TIME:  Fluoroscopy Time: 11 minutes 6 seconds (698 mGy). COMPLICATIONS: None immediate. PROCEDURE: Informed consent was obtained from the [place] following explanation of the procedure, risks, benefits and alternatives. The patient understands, agrees and consents for the procedure. All questions were addressed. A time out was performed prior to the initiation of the procedure. Maximal barrier sterile technique utilized including caps, mask, sterile gowns, sterile gloves, large sterile drape, hand hygiene, and Betadine prep. Under sterile conditions and local anesthesia, micropuncture access performed of the right common femoral artery with ultrasound. Images obtained for documentation of the patent right common femoral artery. Five French sheath inserted over a Bentson guidewire. Imager C2 catheter utilized to select the celiac origin. Celiac angiogram performed. Celiac: Celiac origin is widely patent. The left gastric, hepatic, splenic vasculature patent. Splenic artery is mildly enlarged and tortuous. Over guidewire, the C2 catheter was advanced into the splenic artery. Splenic angiogram performed. Splenic: Patent tortuous splenic artery. Heterogeneous parenchymal enhancement of the spleen compatible with diffuse splenic injury. Evidence of mass effect and displacement of the spleen from a large subcapsular hematoma. Splenic artery proximal coil embolization: Through the C2 catheter, a Renegade STC microcatheter was advanced more peripherally into the splenic artery past any large dorsal pancreatic branches. Contrast injection confirms position within the splenic artery. Measurements obtained for the appropriate vessel diameter. Through the microcatheter, 018 interlock coils were deployed approximately 9 coils ranging in size from 8-12 mm for coil embolization of the splenic artery. Post embolization angiogram confirms stasis within the splenic artery from the proximal coil  embolization. Access removed. Hemostasis obtained with the ExoSeal device. No immediate complication. Patient tolerated the procedure well. IMPRESSION: Successful splenic artery proximal micro coil embolization for splenic laceration with delayed bleeding, hemoperitoneum, and anemia. Electronically Signed   By: Jerilynn Mages.  Shick M.D.   On: 10/30/2018 08:32   Ir US Guide Vasc Access Right  Result Date: 10/30/2018 INDICATION: Left upper quadrant trauma, splenic laceration with delayed bleeding, anemia EXAM: ULTRASOUND GUIDANCE FOR VASCULAR ACCESS CELIAC AND SPLENIC CATHETERIZATIONS AND ANGIOGRAMS PROXIMAL SPLENIC COIL EMBOLIZATION MEDICATIONS: 1% LIDOCAINE LOCAL. ANESTHESIA/SEDATION: Moderate (conscious) sedation was employed during this procedure. A total of Versed 1.5 mg and Fentanyl 75 mcg was administered intravenously. Moderate Sedation Time: 40 minutes. The patient's level of consciousness and vital signs were monitored continuously by radiology nursing throughout the procedure under my direct supervision. CONTRAST:  75 CC ISOVUE-300 FLUOROSCOPY TIME:  Fluoroscopy Time: 11 minutes 6 seconds (698 mGy). COMPLICATIONS: None immediate. PROCEDURE: Informed consent was obtained from the [employer] following explanation of the procedure, risks, benefits and alternatives. The patient understands, agrees and consents for the procedure. All questions were addressed. A time out was performed prior to the initiation of the procedure. Maximal barrier sterile technique utilized including caps, mask, sterile gowns, sterile gloves, large sterile drape, hand hygiene, and Betadine prep. Under sterile conditions and local anesthesia, micropuncture access performed of the right common femoral artery with ultrasound. Images obtained for documentation of the patent right common femoral artery. Five French sheath inserted over a Bentson guidewire. Imager C2 catheter utilized to select the celiac origin. Celiac angiogram performed.  Celiac: Celiac origin is widely patent. The left gastric, hepatic, splenic vasculature patent. Splenic artery is mildly enlarged and tortuous. Over guidewire, the  C2 catheter was advanced into the splenic artery. Splenic angiogram performed. Splenic: Patent tortuous splenic artery. Heterogeneous parenchymal enhancement of the spleen compatible with diffuse splenic injury. Evidence of mass effect and displacement of the spleen from a large subcapsular hematoma. Splenic artery proximal coil embolization: Through the C2 catheter, a Renegade STC microcatheter was advanced more peripherally into the splenic artery past any large dorsal pancreatic branches. Contrast injection confirms position within the splenic artery. Measurements obtained for the appropriate vessel diameter. Through the microcatheter, 018 interlock coils were deployed approximately 9 coils ranging in size from 8-12 mm for coil embolization of the splenic artery. Post embolization angiogram confirms stasis within the splenic artery from the proximal coil embolization. Access removed. Hemostasis obtained with the ExoSeal device. No immediate complication. Patient tolerated the procedure well. IMPRESSION: Successful splenic artery proximal micro coil embolization for splenic laceration with delayed bleeding, hemoperitoneum, and anemia. Electronically Signed   By: Jerilynn Mages.  Shick M.D.   On: 10/30/2018 08:32   [place] Guide Roadmapping  Result Date: 10/30/2018 INDICATION: Left upper quadrant trauma, splenic laceration with delayed bleeding, anemia EXAM: ULTRASOUND GUIDANCE FOR VASCULAR ACCESS CELIAC AND SPLENIC CATHETERIZATIONS AND ANGIOGRAMS PROXIMAL SPLENIC COIL EMBOLIZATION MEDICATIONS: 1% LIDOCAINE LOCAL. ANESTHESIA/SEDATION: Moderate (conscious) sedation was employed during this procedure. A total of Versed 1.5 mg and Fentanyl 75 mcg was administered intravenously. Moderate Sedation Time: 40 minutes. The patient's level  of consciousness and vital signs were monitored continuously by radiology nursing throughout the procedure under my direct supervision. CONTRAST:  75 CC ISOVUE-300 FLUOROSCOPY TIME:  Fluoroscopy Time: 11 minutes 6 seconds (698 mGy). COMPLICATIONS: None immediate. PROCEDURE: Informed consent was obtained from the [place] following explanation of the procedure, risks, benefits and alternatives. The patient understands, agrees and consents for the procedure. All questions were addressed. A time out was performed prior to the initiation of the procedure. Maximal barrier sterile technique utilized including caps, mask, sterile gowns, sterile gloves, large sterile drape, hand hygiene, and Betadine prep. Under sterile conditions and local anesthesia, micropuncture access performed of the right common femoral artery with ultrasound. Images obtained for documentation of the patent right common femoral artery. Five French sheath inserted over a Bentson guidewire. Imager C2 catheter utilized to select the celiac origin. Celiac angiogram performed. Celiac: Celiac origin is widely patent. The left gastric, hepatic, splenic vasculature patent. Splenic artery is mildly enlarged and tortuous. Over guidewire, the C2 catheter was advanced into the splenic artery. Splenic angiogram performed. Splenic: Patent tortuous splenic artery. Heterogeneous parenchymal enhancement of the spleen compatible with diffuse splenic injury. Evidence of mass effect and displacement of the spleen from a large subcapsular hematoma. Splenic artery proximal coil embolization: Through the C2 catheter, a Renegade STC microcatheter was advanced more peripherally into the splenic artery past any large dorsal pancreatic branches. Contrast injection confirms position within the splenic artery. Measurements obtained for the appropriate vessel diameter. Through the microcatheter, 018 interlock coils were deployed approximately 9 coils ranging in size from  8-12 mm for coil embolization of the splenic artery. Post embolization angiogram confirms stasis within the splenic artery from the proximal coil embolization. Access removed. Hemostasis obtained with the ExoSeal device. No immediate complication. Patient tolerated the procedure well. IMPRESSION: Successful splenic artery proximal micro coil embolization for splenic laceration with delayed bleeding, hemoperitoneum, and anemia. Electronically Signed   By: Jerilynn Mages.  Shick M.D.   On: 10/30/2018 08:32   Ct Angio Abd/pel W And/or Wo Contrast  Result Date:  10/28/2018 CLINICAL DATA:  83 year old male with a history of fall several weeks previously and left-sided rib fractures. Today, patient developed acute left flank pain followed by syncope and recurrent fall. EXAM: CT ANGIOGRAPHY ABDOMEN AND PELVIS WITH CONTRAST AND WITHOUT CONTRAST TECHNIQUE: Multidetector CT imaging of the abdomen and pelvis was performed using the standard protocol during bolus administration of intravenous contrast. Multiplanar reconstructed images and MIPs were obtained and reviewed to evaluate the vascular anatomy. CONTRAST:  31mL OMNIPAQUE IOHEXOL 350 MG/ML SOLN COMPARISON:  CT scan of the chest 12/23/2017; abdominal ultrasound 06/03/2016 FINDINGS: VASCULAR Aorta: Scattered atherosclerotic vascular calcifications. No evidence of aneurysm or dissection. Celiac: Patent without evidence of aneurysm, dissection, vasculitis or significant stenosis. SMA: Patent without evidence of aneurysm, dissection, vasculitis or significant stenosis. Renals: Single left renal artery. Dual right-sided renal arteries. Mild atherosclerotic plaque but no significant stenosis, dissection, aneurysm or changes of FMD. IMA: Patent without evidence of aneurysm, dissection, vasculitis or significant stenosis. Inflow: Patent without evidence of aneurysm, dissection, vasculitis or significant stenosis. Proximal Outflow: Bilateral common femoral and visualized portions of the  superficial and profunda femoral arteries are patent without evidence of aneurysm, dissection, vasculitis or significant stenosis. Veins: No focal venous abnormality. Review of the MIP images confirms the above findings. NON-VASCULAR Lower chest: Incompletely imaged thoracic aortic aneurysm with a maximal diameter of 4.3 cm. This has been noted on prior CT imaging of the chest. Coronary artery calcifications are evident. The heart is normal in size. Unremarkable distal thoracic esophagus. Pulmonary cysts versus bulla formation in the lower lobes. Diffuse mild bronchial wall thickening. Mild dependent atelectasis. Hepatobiliary: Normal hepatic contour and morphology. No discrete hepatic lesions. Normal appearance of the gallbladder. No intra or extrahepatic biliary ductal dilatation. Pancreas: Unremarkable. No pancreatic ductal dilatation or surrounding inflammatory changes. Spleen: Diffusely abnormal appearance of the spleen. The spleen is indistinct and there appears to be low-attenuation in a subcapsular configuration along the posterolateral margin of the spleen. Findings are concerning for acute splenic injury. There is associated perisplenic hematoma. No evidence of active contrast extravasation. Adrenals/Urinary Tract: Adrenal glands are unremarkable. Kidneys are normal, without renal calculi, focal lesion, or hydronephrosis. Bladder is unremarkable. Stomach/Bowel: Surgical changes of prior appendectomy. No evidence of focal bowel wall thickening or obstruction. Lymphatic: No suspicious lymphadenopathy. Reproductive: Prostate is unremarkable. Other: Moderate volume hemoperitoneum in the perisplenic and perihepatic space and also in the rectal cul-de-sac. Musculoskeletal: Acute displaced fractures of the lateral aspects of left ribs 9 and 10 overlying the site of splenic injury. IMPRESSION: 1. Evidence of acute splenic injury with moderate volume hemoperitoneum. No evidence of active extravasation of contrast  material to suggest active bleeding. Given the clinical history of a fall several weeks previously and new acute onset symptoms today, findings are concerning for delayed splenic rupture. 2. Acute appearing and mildly displaced fractures of the lateral aspect of left ribs 9 and 10. 3. No evidence of acute aortic or vascular injury or abnormality. 4. Incompletely imaged but known thoracic aortic aneurysm. Aortic aneurysm NOS (ICD10-I71.9); Aortic Atherosclerosis (ICD10-170.0) 5. Diffuse mild bronchial wall thickening suggests chronic bronchitis. 6. Coronary artery calcifications. 7. Lower lobe pulmonary cysts versus bullous emphysematous changes. These results were called by telephone at the time of interpretation on 10/28/2018 at 7:45 pm to Dr. Nance Pear , who verbally acknowledged these results. Electronically Signed   By: Jacqulynn Cadet M.D.   On: 10/28/2018 19:45    Labs:  CBC: Recent Labs    10/29/18 0531 10/29/18 1445 10/29/18 2141 10/30/18  0529  WBC 6.7 9.5 13.1* 12.2*  HGB 11.6* 9.8* 9.8* 8.6*  HCT 34.6* 29.7* 29.4* 25.5*  PLT 145* 167 152 143*    COAGS: Recent Labs    10/29/18 0531  INR 1.01    BMP: Recent Labs    05/12/18 1215 08/24/18 1103 10/28/18 1502 10/29/18 0531  NA 140 142 139 138  K 4.4 4.5 3.4* 3.5  CL 101 103 104 107  CO2 30 31 25 22   GLUCOSE 91 85 226* 109*  BUN 24 19 17 14   CALCIUM 9.2 9.2 8.5* 8.1*  CREATININE 1.35* 1.41* 1.36* 1.06  GFRNONAA 49* 47* 48* >60  GFRAA 57* 54* 55* >60    LIVER FUNCTION TESTS: Recent Labs    05/12/18 1215 08/24/18 1103 10/29/18 0531  BILITOT 1.6* 1.2 2.0*  AST 17 20 25   ALT 10 10 16   ALKPHOS  --   --  40  PROT 6.8 6.3 5.3*  ALBUMIN  --   --  3.0*    Assessment and Plan:  Splenic artery embolization in IR last night Plan per Trauma MD  Electronically Signed: Monia Sabal A, PA-C 10/30/2018, 9:15 AM   I spent a total of 15 Minutes at the the patient's bedside AND on the patient's hospital  floor or unit, greater than 50% of which was counseling/coordinating care for splenic artery embo

## 2018-10-30 NOTE — Progress Notes (Signed)
PT Cancellation Note  Patient Details Name: Anthony Mosley MRN: [id number] DOB: 1935-10-13   Cancelled Treatment:    Reason Eval/Treat Not Completed: Other (comment)(pt just returned to bed from chair with nursing and denied mobility at this time)   Zayed Griffie B Yamilee Harmes 10/30/2018, 12:29 PM Aveline Daus Pam Drown, PT Acute Rehabilitation Services Pager: [phone] Office: [phone]

## 2018-10-30 NOTE — Progress Notes (Signed)
BP low at 86/58(68). MD around to check on patient. New order received.

## 2018-10-30 NOTE — Progress Notes (Signed)
   10/30/18 1100  Clinical Encounter Type  Visited With Patient;Patient and family together  Visit Type Initial;Spiritual support;Other (Comment) (AD)  Referral From Nurse  Spiritual Encounters  Spiritual Needs Literature;Prayer;Emotional  Stress Factors  Patient Stress Factors Health changes   Responded to [facility] request for AD.  Coordinated witnesses and notary after I reviewed the forms w/ the pt.  Made copies, one entered into pt's binder per directions of an RN on the floor, original and two copies returned to pt.  Prayed w/ pt and his brother at pt's request.  Myra Gianotti resident, [phone]

## 2018-10-31 LAB — CBC
HCT: 23.1 % — ABNORMAL LOW (ref 39.0–52.0)
HEMATOCRIT: 20.3 % — AB (ref 39.0–52.0)
HEMOGLOBIN: 7.8 g/dL — AB (ref 13.0–17.0)
Hemoglobin: 6.7 g/dL — CL (ref 13.0–17.0)
MCH: 29.1 pg (ref 26.0–34.0)
MCH: 29.8 pg (ref 26.0–34.0)
MCHC: 33 g/dL (ref 30.0–36.0)
MCHC: 33.8 g/dL (ref 30.0–36.0)
MCV: 88.3 fL (ref 80.0–100.0)
MCV: 88.2 fL (ref 80.0–100.0)
NRBC: 0.1 % (ref 0.0–0.2)
Platelets: 133 10*3/uL — ABNORMAL LOW (ref 150–400)
Platelets: 153 10*3/uL (ref 150–400)
RBC: 2.3 MIL/uL — AB (ref 4.22–5.81)
RBC: 2.62 MIL/uL — ABNORMAL LOW (ref 4.22–5.81)
RDW: 15.3 % (ref 11.5–15.5)
RDW: 14.9 % (ref 11.5–15.5)
WBC: 12.5 10*3/uL — ABNORMAL HIGH (ref 4.0–10.5)
WBC: 13.9 10*3/uL — ABNORMAL HIGH (ref 4.0–10.5)
nRBC: 0 % (ref 0.0–0.2)

## 2018-10-31 LAB — BASIC METABOLIC PANEL
Anion gap: 7 (ref 5–15)
BUN: 27 mg/dL — ABNORMAL HIGH (ref 8–23)
CO2: 18 mmol/L — ABNORMAL LOW (ref 22–32)
Calcium: 7.8 mg/dL — ABNORMAL LOW (ref 8.9–10.3)
Chloride: 111 mmol/L (ref 98–111)
Creatinine, Ser: 1.53 mg/dL — ABNORMAL HIGH (ref 0.61–1.24)
GFR calc Af Amer: 48 mL/min — ABNORMAL LOW (ref >60)
GFR calc non Af Amer: 41 mL/min — ABNORMAL LOW (ref >60)
Glucose, Bld: 116 mg/dL — ABNORMAL HIGH (ref 70–99)
Potassium: 4.1 mmol/L (ref 3.5–5.1)
Sodium: 136 mmol/L (ref 135–145)

## 2018-10-31 LAB — MAGNESIUM: MAGNESIUM: 1.6 mg/dL — AB (ref 1.7–2.4)

## 2018-10-31 LAB — PREPARE RBC (CROSSMATCH)

## 2018-10-31 MED ORDER — DOCUSATE SODIUM 100 MG PO CAPS
100.0000 mg | ORAL_CAPSULE | Freq: Every day | ORAL | Status: DC
  Administered 2018-10-31 – 2018-11-04 (×4): 100 mg via ORAL
  Filled 2018-10-31 (×5): qty 1

## 2018-10-31 MED ORDER — ORAL CARE MOUTH RINSE: 15.0000 mL | Freq: Two times a day (BID) | OROMUCOSAL | Status: DC

## 2018-10-31 MED ORDER — TRAMADOL HCL 50 MG PO TABS: 50.0000 mg | ORAL_TABLET | Freq: Four times a day (QID) | ORAL | Status: DC | PRN

## 2018-10-31 MED ORDER — CHLORHEXIDINE GLUCONATE 0.12 % MT SOLN
15.0000 mL | Freq: Two times a day (BID) | OROMUCOSAL | Status: DC
  Administered 2018-10-31: 15 mL via OROMUCOSAL

## 2018-10-31 MED ORDER — SODIUM CHLORIDE 0.9% IV SOLUTION: Freq: Once | INTRAVENOUS | Status: DC

## 2018-10-31 NOTE — Progress Notes (Signed)
Referring Physician(s): Dr Deland Pretty  Supervising Physician: Markus Daft  Patient Status:  [facility] - In-pt  Chief Complaint:  Splenic rupture  Subjective:  Splenic lac with delayed bleeding and hemoperitoneum S/p splenic angio and proximal splenic artery coil embo-- 11/14  Feeling well this am Denies pain or SOB Slept well Hg dropped to 6.7, pt received another 1 unit PRBC this am.  Allergies: Macrolides and ketolides; Norvasc [amlodipine]; and Avodart [dutasteride]  Medications:  Current Facility-Administered Medications:  .  0.9 %  sodium chloride infusion (Manually program via Guardrails IV Fluids), , Intravenous, Once, Kinsinger, Arta Bruce, MD .  0.9 % NaCl with KCl 20 mEq/ L  infusion, , Intravenous, Continuous, Greer Pickerel, MD, Last Rate: 75 mL/hr at 10/31/18 1100 .  acetaminophen (TYLENOL) tablet 1,000 mg, 1,000 mg, Oral, Q8H, Judeth Horn, MD, 1,000 mg at 10/31/18 0528 .  alum & mag hydroxide-simeth (MAALOX/MYLANTA) 200-200-20 MG/5ML suspension 30 mL, 30 mL, Oral, Q6H PRN, Judeth Horn, MD .  chlorhexidine (PERIDEX) 0.12 % solution 15 mL, 15 mL, Mouth Rinse, BID, Donnie Mesa, MD, 15 mL at 10/31/18 1100 .  docusate sodium (COLACE) capsule 100 mg, 100 mg, Oral, Daily, Donnie Mesa, MD, 100 mg at 10/31/18 1101 .  famotidine (PEPCID) IVPB 20 mg premix, 20 mg, Intravenous, Q12H, Judeth Horn, MD, Stopped at 10/31/18 1001 .  magnesium oxide (MAG-OX) tablet 200 mg, 200 mg, Oral, BID, Focht, Jessica L, PA, 200 mg at 10/31/18 0931 .  MEDLINE mouth rinse, 15 mL, Mouth Rinse, q12n4p, Donnie Mesa, MD .  methocarbamol (ROBAXIN) 500 mg in dextrose 5 % 50 mL IVPB, 500 mg, Intravenous, Q8H, Judeth Horn, MD, Stopped at 10/31/18 0559 .  morphine 2 MG/ML injection 1-2 mg, 1-2 mg, Intravenous, Q2H PRN, Judeth Horn, MD .  ondansetron [facility]) injection 4 mg, 4 mg, Intravenous, Q6H PRN, Judeth Horn, MD .  promethazine (PHENERGAN) injection 12.5 mg, 12.5 mg, Intravenous, Q6H PRN,  Judeth Horn, MD .  traMADol Veatrice Bourbon) tablet 50 mg, 50 mg, Oral, Q6H PRN, Donnie Mesa, MD    Vital Signs: BP 117/71   Pulse 85   Temp 97.7 F (36.5 C) (Axillary)   Resp 17   Ht 6\' 1"  (1.854 m)   Wt 95.6 kg   SpO2 94%   BMI 27.81 kg/m   Physical Exam  Constitutional: He is oriented to person, place, and time.  Abdominal: Soft. Bowel sounds are normal.  Musculoskeletal: Normal range of motion.  Neurological: He is alert and oriented to person, place, and time.  Skin: Skin is warm and dry.  Right groin NT no bleeding No hematoma Rt foot 1+ pulses  Psychiatric: He has a normal mood and affect. His behavior is normal.  Vitals reviewed.   Imaging: Ct Head Wo Contrast  Result Date: 10/28/2018 CLINICAL DATA:  Syncope, fall EXAM: CT HEAD WITHOUT CONTRAST TECHNIQUE: Contiguous axial images were obtained from the base of the skull through the vertex without intravenous contrast. COMPARISON:  None. FINDINGS: Brain: No evidence of acute infarction, hemorrhage, extra-axial collection, ventriculomegaly, or mass effect. Generalized cerebral atrophy. Periventricular white matter low attenuation likely secondary to microangiopathy. Vascular: Cerebrovascular atherosclerotic calcifications are noted. Skull: Negative for fracture or focal lesion. Sinuses/Orbits: Visualized portions of the orbits are unremarkable. Visualized portions of the paranasal sinuses and mastoid air cells are unremarkable. Other: None. IMPRESSION: No acute intracranial pathology. Electronically Signed   By: Kathreen Devoid   On: 10/28/2018 15:50   Ir Angiogram Visceral Selective  Result Date: 10/30/2018  INDICATION: Left upper quadrant trauma, splenic laceration with delayed bleeding, anemia EXAM: ULTRASOUND GUIDANCE FOR VASCULAR ACCESS CELIAC AND SPLENIC CATHETERIZATIONS AND ANGIOGRAMS PROXIMAL SPLENIC COIL EMBOLIZATION MEDICATIONS: 1% LIDOCAINE LOCAL. ANESTHESIA/SEDATION: Moderate (conscious) sedation was employed during  this procedure. A total of Versed 1.5 mg and Fentanyl 75 mcg was administered intravenously. Moderate Sedation Time: 40 minutes. The patient's level of consciousness and vital signs were monitored continuously by radiology nursing throughout the procedure under my direct supervision. CONTRAST:  75 CC ISOVUE-300 FLUOROSCOPY TIME:  Fluoroscopy Time: 11 minutes 6 seconds (698 mGy). COMPLICATIONS: None immediate. PROCEDURE: Informed consent was obtained from the [place] following explanation of the procedure, risks, benefits and alternatives. The patient understands, agrees and consents for the procedure. All questions were addressed. A time out was performed prior to the initiation of the procedure. Maximal barrier sterile technique utilized including caps, mask, sterile gowns, sterile gloves, large sterile drape, hand hygiene, and Betadine prep. Under sterile conditions and local anesthesia, micropuncture access performed of the right common femoral artery with ultrasound. Images obtained for documentation of the patent right common femoral artery. Five French sheath inserted over a Bentson guidewire. Imager C2 catheter utilized to select the celiac origin. Celiac angiogram performed. Celiac: Celiac origin is widely patent. The left gastric, hepatic, splenic vasculature patent. Splenic artery is mildly enlarged and tortuous. Over guidewire, the C2 catheter was advanced into the splenic artery. Splenic angiogram performed. Splenic: Patent tortuous splenic artery. Heterogeneous parenchymal enhancement of the spleen compatible with diffuse splenic injury. Evidence of mass effect and displacement of the spleen from a large subcapsular hematoma. Splenic artery proximal coil embolization: Through the C2 catheter, a Renegade STC microcatheter was advanced more peripherally into the splenic artery past any large dorsal pancreatic branches. Contrast injection confirms position within the splenic artery. Measurements  obtained for the appropriate vessel diameter. Through the microcatheter, 018 interlock coils were deployed approximately 9 coils ranging in size from 8-12 mm for coil embolization of the splenic artery. Post embolization angiogram confirms stasis within the splenic artery from the proximal coil embolization. Access removed. Hemostasis obtained with the ExoSeal device. No immediate complication. Patient tolerated the procedure well. IMPRESSION: Successful splenic artery proximal micro coil embolization for splenic laceration with delayed bleeding, hemoperitoneum, and anemia. Electronically Signed   By: Jerilynn Mages.  Shick M.D.   On: 10/30/2018 08:32   Ir US Guide Vasc Access Right  Result Date: 10/30/2018 INDICATION: Left upper quadrant trauma, splenic laceration with delayed bleeding, anemia EXAM: ULTRASOUND GUIDANCE FOR VASCULAR ACCESS CELIAC AND SPLENIC CATHETERIZATIONS AND ANGIOGRAMS PROXIMAL SPLENIC COIL EMBOLIZATION MEDICATIONS: 1% LIDOCAINE LOCAL. ANESTHESIA/SEDATION: Moderate (conscious) sedation was employed during this procedure. A total of Versed 1.5 mg and Fentanyl 75 mcg was administered intravenously. Moderate Sedation Time: 40 minutes. The patient's level of consciousness and vital signs were monitored continuously by radiology nursing throughout the procedure under my direct supervision. CONTRAST:  75 CC ISOVUE-300 FLUOROSCOPY TIME:  Fluoroscopy Time: 11 minutes 6 seconds (698 mGy). COMPLICATIONS: None immediate. PROCEDURE: Informed consent was obtained from the [place] following explanation of the procedure, risks, benefits and alternatives. The patient understands, agrees and consents for the procedure. All questions were addressed. A time out was performed prior to the initiation of the procedure. Maximal barrier sterile technique utilized including caps, mask, sterile gowns, sterile gloves, large sterile drape, hand hygiene, and Betadine prep. Under sterile conditions and local anesthesia,  micropuncture access performed of the right common femoral artery with ultrasound. Images obtained for documentation of the patent  right common femoral artery. Five French sheath inserted over a Bentson guidewire. Imager C2 catheter utilized to select the celiac origin. Celiac angiogram performed. Celiac: Celiac origin is widely patent. The left gastric, hepatic, splenic vasculature patent. Splenic artery is mildly enlarged and tortuous. Over guidewire, the C2 catheter was advanced into the splenic artery. Splenic angiogram performed. Splenic: Patent tortuous splenic artery. Heterogeneous parenchymal enhancement of the spleen compatible with diffuse splenic injury. Evidence of mass effect and displacement of the spleen from a large subcapsular hematoma. Splenic artery proximal coil embolization: Through the C2 catheter, a Renegade STC microcatheter was advanced more peripherally into the splenic artery past any large dorsal pancreatic branches. Contrast injection confirms position within the splenic artery. Measurements obtained for the appropriate vessel diameter. Through the microcatheter, 018 interlock coils were deployed approximately 9 coils ranging in size from 8-12 mm for coil embolization of the splenic artery. Post embolization angiogram confirms stasis within the splenic artery from the proximal coil embolization. Access removed. Hemostasis obtained with the ExoSeal device. No immediate complication. Patient tolerated the procedure well. IMPRESSION: Successful splenic artery proximal micro coil embolization for splenic laceration with delayed bleeding, hemoperitoneum, and anemia. Electronically Signed   By: Jerilynn Mages.  Shick M.D.   On: 10/30/2018 08:32   Waller Guide Roadmapping  Result Date: 10/30/2018 INDICATION: Left upper quadrant trauma, splenic laceration with delayed bleeding, anemia EXAM: ULTRASOUND GUIDANCE FOR VASCULAR ACCESS CELIAC AND SPLENIC CATHETERIZATIONS AND  ANGIOGRAMS PROXIMAL SPLENIC COIL EMBOLIZATION MEDICATIONS: 1% LIDOCAINE LOCAL. ANESTHESIA/SEDATION: Moderate (conscious) sedation was employed during this procedure. A total of Versed 1.5 mg and Fentanyl 75 mcg was administered intravenously. Moderate Sedation Time: 40 minutes. The patient's level of consciousness and vital signs were monitored continuously by radiology nursing throughout the procedure under my direct supervision. CONTRAST:  75 CC ISOVUE-300 FLUOROSCOPY TIME:  Fluoroscopy Time: 11 minutes 6 seconds (698 mGy). COMPLICATIONS: None immediate. PROCEDURE: Informed consent was obtained from the [place] following explanation of the procedure, risks, benefits and alternatives. The patient understands, agrees and consents for the procedure. All questions were addressed. A time out was performed prior to the initiation of the procedure. Maximal barrier sterile technique utilized including caps, mask, sterile gowns, sterile gloves, large sterile drape, hand hygiene, and Betadine prep. Under sterile conditions and local anesthesia, micropuncture access performed of the right common femoral artery with ultrasound. Images obtained for documentation of the patent right common femoral artery. Five French sheath inserted over a Bentson guidewire. Imager C2 catheter utilized to select the celiac origin. Celiac angiogram performed. Celiac: Celiac origin is widely patent. The left gastric, hepatic, splenic vasculature patent. Splenic artery is mildly enlarged and tortuous. Over guidewire, the C2 catheter was advanced into the splenic artery. Splenic angiogram performed. Splenic: Patent tortuous splenic artery. Heterogeneous parenchymal enhancement of the spleen compatible with diffuse splenic injury. Evidence of mass effect and displacement of the spleen from a large subcapsular hematoma. Splenic artery proximal coil embolization: Through the C2 catheter, a Renegade STC microcatheter was advanced more  peripherally into the splenic artery past any large dorsal pancreatic branches. Contrast injection confirms position within the splenic artery. Measurements obtained for the appropriate vessel diameter. Through the microcatheter, 018 interlock coils were deployed approximately 9 coils ranging in size from 8-12 mm for coil embolization of the splenic artery. Post embolization angiogram confirms stasis within the splenic artery from the proximal coil embolization. Access removed. Hemostasis obtained with the ExoSeal device. No immediate complication. Patient  tolerated the procedure well. IMPRESSION: Successful splenic artery proximal micro coil embolization for splenic laceration with delayed bleeding, hemoperitoneum, and anemia. Electronically Signed   By: Jerilynn Mages.  Shick M.D.   On: 10/30/2018 08:32   Ct Angio Abd/pel W And/or Wo Contrast  Result Date: 10/28/2018 CLINICAL DATA:  83 year old male with a history of fall several weeks previously and left-sided rib fractures. Today, patient developed acute left flank pain followed by syncope and recurrent fall. EXAM: CT ANGIOGRAPHY ABDOMEN AND PELVIS WITH CONTRAST AND WITHOUT CONTRAST TECHNIQUE: Multidetector CT imaging of the abdomen and pelvis was performed using the standard protocol during bolus administration of intravenous contrast. Multiplanar reconstructed images and MIPs were obtained and reviewed to evaluate the vascular anatomy. CONTRAST:  18mL OMNIPAQUE IOHEXOL 350 MG/ML SOLN COMPARISON:  CT scan of the chest 12/23/2017; abdominal ultrasound 06/03/2016 FINDINGS: VASCULAR Aorta: Scattered atherosclerotic vascular calcifications. No evidence of aneurysm or dissection. Celiac: Patent without evidence of aneurysm, dissection, vasculitis or significant stenosis. SMA: Patent without evidence of aneurysm, dissection, vasculitis or significant stenosis. Renals: Single left renal artery. Dual right-sided renal arteries. Mild atherosclerotic plaque but no significant  stenosis, dissection, aneurysm or changes of FMD. IMA: Patent without evidence of aneurysm, dissection, vasculitis or significant stenosis. Inflow: Patent without evidence of aneurysm, dissection, vasculitis or significant stenosis. Proximal Outflow: Bilateral common femoral and visualized portions of the superficial and profunda femoral arteries are patent without evidence of aneurysm, dissection, vasculitis or significant stenosis. Veins: No focal venous abnormality. Review of the MIP images confirms the above findings. NON-VASCULAR Lower chest: Incompletely imaged thoracic aortic aneurysm with a maximal diameter of 4.3 cm. This has been noted on prior CT imaging of the chest. Coronary artery calcifications are evident. The heart is normal in size. Unremarkable distal thoracic esophagus. Pulmonary cysts versus bulla formation in the lower lobes. Diffuse mild bronchial wall thickening. Mild dependent atelectasis. Hepatobiliary: Normal hepatic contour and morphology. No discrete hepatic lesions. Normal appearance of the gallbladder. No intra or extrahepatic biliary ductal dilatation. Pancreas: Unremarkable. No pancreatic ductal dilatation or surrounding inflammatory changes. Spleen: Diffusely abnormal appearance of the spleen. The spleen is indistinct and there appears to be low-attenuation in a subcapsular configuration along the posterolateral margin of the spleen. Findings are concerning for acute splenic injury. There is associated perisplenic hematoma. No evidence of active contrast extravasation. Adrenals/Urinary Tract: Adrenal glands are unremarkable. Kidneys are normal, without renal calculi, focal lesion, or hydronephrosis. Bladder is unremarkable. Stomach/Bowel: Surgical changes of prior appendectomy. No evidence of focal bowel wall thickening or obstruction. Lymphatic: No suspicious lymphadenopathy. Reproductive: Prostate is unremarkable. Other: Moderate volume hemoperitoneum in the perisplenic and  perihepatic space and also in the rectal cul-de-sac. Musculoskeletal: Acute displaced fractures of the lateral aspects of left ribs 9 and 10 overlying the site of splenic injury. IMPRESSION: 1. Evidence of acute splenic injury with moderate volume hemoperitoneum. No evidence of active extravasation of contrast material to suggest active bleeding. Given the clinical history of a fall several weeks previously and new acute onset symptoms today, findings are concerning for delayed splenic rupture. 2. Acute appearing and mildly displaced fractures of the lateral aspect of left ribs 9 and 10. 3. No evidence of acute aortic or vascular injury or abnormality. 4. Incompletely imaged but known thoracic aortic aneurysm. Aortic aneurysm NOS (ICD10-I71.9); Aortic Atherosclerosis (ICD10-170.0) 5. Diffuse mild bronchial wall thickening suggests chronic bronchitis. 6. Coronary artery calcifications. 7. Lower lobe pulmonary cysts versus bullous emphysematous changes. These results were called by telephone at the time of interpretation  on 10/28/2018 at 7:45 pm to Dr. Nance Pear , who verbally acknowledged these results. Electronically Signed   By: Jacqulynn Cadet M.D.   On: 10/28/2018 19:45    Labs:  CBC: Recent Labs    10/29/18 1445 10/29/18 2141 10/30/18 0529 10/31/18 0305  WBC 9.5 13.1* 12.2* 12.5*  HGB 9.8* 9.8* 8.6* 6.7*  HCT 29.7* 29.4* 25.5* 20.3*  PLT 167 152 143* 133*    COAGS: Recent Labs    10/29/18 0531  INR 1.01    BMP: Recent Labs    10/28/18 1502 10/29/18 0531 10/30/18 1016 10/31/18 0305  NA 139 138 137 136  K 3.4* 3.5 4.1 4.1  CL 104 107 111 111  CO2 25 22 17* 18*  GLUCOSE 226* 109* 152* 116*  BUN 17 14 23  27*  CALCIUM 8.5* 8.1* 7.8* 7.8*  CREATININE 1.36* 1.06 1.63* 1.53*  GFRNONAA 48* >60 38* 41*  GFRAA 55* >60 44* 48*    LIVER FUNCTION TESTS: Recent Labs    05/12/18 1215 08/24/18 1103 10/29/18 0531  BILITOT 1.6* 1.2 2.0*  AST 17 20 25   ALT 10 10 16     ALKPHOS  --   --  40  PROT 6.8 6.3 5.3*  ALBUMIN  --   --  3.0*    Assessment and Plan:  Splenic artery embolization in IR last night Plan per Trauma MD Call IR if needed  Electronically Signed: Ascencion Dike, PA-C 10/31/2018, 11:11 AM   I spent a total of 15 Minutes at the the patient's bedside AND on the patient's hospital floor or unit, greater than 50% of which was counseling/coordinating care for splenic artery embo

## 2018-10-31 NOTE — Progress Notes (Signed)
PT Cancellation Note  Patient Details Name: Anthony Mosley MRN: [id number] DOB: [birth year]   Cancelled Treatment:    Reason Eval/Treat Not Completed: Medical issues which prohibited therapy(Hgb 6.7)   [name] 10/31/2018, 7:09 AM  Elwyn Reach, PT Acute Rehabilitation Services Pager: [phone] Office: [phone]
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
## 2018-10-31 NOTE — Progress Notes (Signed)
Critical lab of Hgb 6.7 called on the patient. MD made aware.

## 2018-10-31 NOTE — Progress Notes (Signed)
Blood transfusion started at 0700

## 2018-10-31 NOTE — Progress Notes (Signed)
SW received consult for patient for completing an advanced directive. Per chart review, AD was completed on 10/30/18 with Temple Pacini, Chaplain. Documentation was placed in patient's binder and original was given to patient.  Please re-consult CSW if additional needs or concerns arise.  Madilyn Fireman, MSW Medical Social Worker [place]

## 2018-11-01 LAB — CBC
HCT: 22.7 % — ABNORMAL LOW (ref 39.0–52.0)
HEMOGLOBIN: 7.3 g/dL — AB (ref 13.0–17.0)
MCH: 29.1 pg (ref 26.0–34.0)
MCHC: 32.2 g/dL (ref 30.0–36.0)
MCV: 90.4 fL (ref 80.0–100.0)
Platelets: 161 10*3/uL (ref 150–400)
RBC: 2.51 MIL/uL — AB (ref 4.22–5.81)
RDW: 15.2 % (ref 11.5–15.5)
WBC: 11 10*3/uL — ABNORMAL HIGH (ref 4.0–10.5)
nRBC: 0.6 % — ABNORMAL HIGH (ref 0.0–0.2)

## 2018-11-01 NOTE — Plan of Care (Signed)
  Problem: Clinical Measurements: Goal: Ability to maintain clinical measurements within normal limits will improve Outcome: Progressing   Problem: Nutrition: Goal: Adequate nutrition will be maintained Outcome: Progressing   Problem: Coping: Goal: Level of anxiety will decrease Outcome: Progressing   

## 2018-11-01 NOTE — Progress Notes (Signed)
RN called to bedside. Pt unable to locate urinal. Pt respiratory rate increased >30br/min, increased effort with belly breathing. Pt reports inability to breathe deeply. No pain reported. O2 sat 97% on room air. Breathe sounds clear bilaterally on auscultation. Pt placed on 2 L [place]. Pt reports breathing easier with oxygen in place. Assessment ongoing.

## 2018-11-02 ENCOUNTER — Encounter (HOSPITAL_COMMUNITY): Payer: Self-pay | Admitting: Physician Assistant

## 2018-11-02 DIAGNOSIS — I428 Other cardiomyopathies: Secondary | ICD-10-CM

## 2018-11-02 DIAGNOSIS — R000 Tachycardia, unspecified: Secondary | ICD-10-CM

## 2018-11-02 LAB — BASIC METABOLIC PANEL
Anion gap: 5 (ref 5–15)
BUN: 14 mg/dL (ref 8–23)
CHLORIDE: 109 mmol/L (ref 98–111)
CO2: 23 mmol/L (ref 22–32)
CREATININE: 1.1 mg/dL (ref 0.61–1.24)
Calcium: 7.9 mg/dL — ABNORMAL LOW (ref 8.9–10.3)
GFR calc Af Amer: >60 mL/min (ref >60)
GLUCOSE: 108 mg/dL — AB (ref 70–99)
Potassium: 3.8 mmol/L (ref 3.5–5.1)
SODIUM: 137 mmol/L (ref 135–145)

## 2018-11-02 LAB — CBC
HCT: 21.9 % — ABNORMAL LOW (ref 39.0–52.0)
Hemoglobin: 7.1 g/dL — ABNORMAL LOW (ref 13.0–17.0)
MCH: 29.6 pg (ref 26.0–34.0)
MCHC: 32.4 g/dL (ref 30.0–36.0)
MCV: 91.3 fL (ref 80.0–100.0)
PLATELETS: 189 10*3/uL (ref 150–400)
RBC: 2.4 MIL/uL — ABNORMAL LOW (ref 4.22–5.81)
RDW: 15.2 % (ref 11.5–15.5)
WBC: 8.7 10*3/uL (ref 4.0–10.5)
nRBC: 0.2 % (ref 0.0–0.2)

## 2018-11-02 LAB — TYPE AND SCREEN
ABO/RH(D): AB POS
ANTIBODY SCREEN: NEGATIVE
UNIT DIVISION: 0
Unit division: 0
Unit division: 0
Unit division: 0

## 2018-11-02 LAB — BPAM RBC
BLOOD PRODUCT EXPIRATION DATE: 201911222359
Blood Product Expiration Date: 201911212359
Blood Product Expiration Date: 201911272359
Blood Product Expiration Date: 201911272359
ISSUE DATE / TIME: 201911141543
ISSUE DATE / TIME: 201911141756
ISSUE DATE / TIME: 201911160646
UNIT TYPE AND RH: 1700
UNIT TYPE AND RH: 7300
Unit Type and Rh: 1700
Unit Type and Rh: 7300

## 2018-11-02 LAB — GLUCOSE, CAPILLARY: Glucose-Capillary: 126 mg/dL — ABNORMAL HIGH (ref 70–99)

## 2018-11-02 LAB — PREPARE RBC (CROSSMATCH)

## 2018-11-02 MED ORDER — FERROUS SULFATE 325 (65 FE) MG PO TABS
325.0000 mg | ORAL_TABLET | Freq: Every day | ORAL | Status: DC
  Administered 2018-11-03 – 2018-11-04 (×2): 325 mg via ORAL
  Filled 2018-11-02 (×2): qty 1

## 2018-11-02 MED ORDER — FUROSEMIDE 10 MG/ML IJ SOLN
60.0000 mg | Freq: Once | INTRAMUSCULAR | Status: AC
  Administered 2018-11-02: 60 mg via INTRAVENOUS
  Filled 2018-11-02: qty 6

## 2018-11-02 MED ORDER — METOPROLOL TARTRATE 5 MG/5ML IV SOLN: 2.5000 mg | Freq: Three times a day (TID) | INTRAVENOUS | Status: DC

## 2018-11-02 MED ORDER — METOPROLOL TARTRATE 5 MG/5ML IV SOLN
2.5000 mg | Freq: Three times a day (TID) | INTRAVENOUS | Status: DC
  Administered 2018-11-02 – 2018-11-03 (×4): 2.5 mg via INTRAVENOUS
  Filled 2018-11-02 (×4): qty 5

## 2018-11-02 MED ORDER — METHOCARBAMOL 500 MG PO TABS: 500.0000 mg | ORAL_TABLET | Freq: Three times a day (TID) | ORAL | Status: DC | PRN

## 2018-11-02 MED ORDER — POTASSIUM CHLORIDE CRYS ER 20 MEQ PO TBCR
40.0000 meq | EXTENDED_RELEASE_TABLET | Freq: Once | ORAL | Status: AC
  Administered 2018-11-02: 40 meq via ORAL
  Filled 2018-11-02: qty 2

## 2018-11-02 MED ORDER — FAMOTIDINE 20 MG PO TABS
20.0000 mg | ORAL_TABLET | Freq: Two times a day (BID) | ORAL | Status: DC
  Administered 2018-11-02 – 2018-11-04 (×5): 20 mg via ORAL
  Filled 2018-11-02 (×5): qty 1

## 2018-11-02 NOTE — Consult Note (Addendum)
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 occasiontal PAT  I would recomm adding low dose b blocker to regimen   Follow HR and BP I would also give IV lasix   Replete electrolytes as needed    Will continue to follow  Dorris Carnes

## 2018-11-02 NOTE — Progress Notes (Signed)
Trauma Service Note  Subjective: Patient sittingup in chair.  No distress.  Seems a bit winded when he talks.  No known history of atrial fibrillation.  Objective: Vital signs in last 24 hours: Temp:  [97.8 F (36.6 C)-98.2 F (36.8 C)] 97.8 F (36.6 C) (11/18 0800) Pulse Rate:  [41-114] 77 (11/18 0800) Resp:  [18-32] 20 (11/18 0800) BP: (97-131)/(48-77) 121/70 (11/18 0800) SpO2:  [92 %-100 %] 100 % (11/18 0800) Last BM Date: 11/01/18  Intake/Output from previous day: 11/17 0701 - 11/18 0700 In: 623 [I.V.:423.5; IV Piggyback:199.5] Out: 5465 [Urine:1475] Intake/Output this shift: No intake/output data recorded.  General: No distress.  Says he wants to be more active  Lungs: Clear to auscultation.  Abd: Soft, benign  Extremities: No changes  Neuro: Intact  Lab Results: CBC  Recent Labs    11/01/18 0218 11/02/18 0427  WBC 11.0* 8.7  HGB 7.3* 7.1*  HCT 22.7* 21.9*  PLT 161 189   BMET Recent Labs    10/31/18 0305 11/02/18 0427  NA 136 137  K 4.1 3.8  CL 111 109  CO2 18* 23  GLUCOSE 116* 108*  BUN 27* 14  CREATININE 1.53* 1.10  CALCIUM 7.8* 7.9*   PT/INR No results for input(s): LABPROT, INR in the last 72 hours. ABG No results for input(s): PHART, HCO3 in the last 72 hours.  Invalid input(s): PCO2, PO2  Studies/Results: No results found.  Anti-infectives: Anti-infectives (From admission, onward)   None      Assessment/Plan: s/p  Hemoglobin is borderline low, will transfuse one unit of red cells.  I have asked for Cardiology consultation for SVT/Afib without prior history of arrhythmia  LOS: 4 days   Kathryne Eriksson. Dahlia Bailiff, MD, FACS [phone] Trauma Surgeon 11/02/2018

## 2018-11-03 LAB — CBC WITH DIFFERENTIAL/PLATELET
Abs Immature Granulocytes: 0.06 10*3/uL (ref 0.00–0.07)
BASOS ABS: 0 10*3/uL (ref 0.0–0.1)
Basophils Relative: 1 %
EOS ABS: 0.2 10*3/uL (ref 0.0–0.5)
EOS PCT: 2 %
HEMATOCRIT: 26.1 % — AB (ref 39.0–52.0)
HEMOGLOBIN: 8.3 g/dL — AB (ref 13.0–17.0)
IMMATURE GRANULOCYTES: 1 %
LYMPHS ABS: 1.3 10*3/uL (ref 0.7–4.0)
LYMPHS PCT: 15 %
MCH: 29.2 pg (ref 26.0–34.0)
MCHC: 31.8 g/dL (ref 30.0–36.0)
MCV: 91.9 fL (ref 80.0–100.0)
MONOS PCT: 12 %
Monocytes Absolute: 1 10*3/uL (ref 0.1–1.0)
NRBC: 0.2 % (ref 0.0–0.2)
Neutro Abs: 6.2 10*3/uL (ref 1.7–7.7)
Neutrophils Relative %: 69 %
Platelets: 247 10*3/uL (ref 150–400)
RBC: 2.84 MIL/uL — ABNORMAL LOW (ref 4.22–5.81)
RDW: 15 % (ref 11.5–15.5)
WBC: 8.8 10*3/uL (ref 4.0–10.5)

## 2018-11-03 LAB — TYPE AND SCREEN
ABO/RH(D): AB POS
Antibody Screen: NEGATIVE
UNIT DIVISION: 0

## 2018-11-03 LAB — BASIC METABOLIC PANEL
ANION GAP: 8 (ref 5–15)
BUN: 17 mg/dL (ref 8–23)
CALCIUM: 8.2 mg/dL — AB (ref 8.9–10.3)
CO2: 25 mmol/L (ref 22–32)
Chloride: 103 mmol/L (ref 98–111)
Creatinine, Ser: 1.05 mg/dL (ref 0.61–1.24)
GLUCOSE: 121 mg/dL — AB (ref 70–99)
Potassium: 3.7 mmol/L (ref 3.5–5.1)
SODIUM: 136 mmol/L (ref 135–145)

## 2018-11-03 LAB — BPAM RBC
BLOOD PRODUCT EXPIRATION DATE: 201912082359
ISSUE DATE / TIME: 201911181225
UNIT TYPE AND RH: 8400

## 2018-11-03 LAB — BRAIN NATRIURETIC PEPTIDE: B NATRIURETIC PEPTIDE 5: 243.4 pg/mL — AB (ref 0.0–100.0)

## 2018-11-03 LAB — TSH: TSH: 6.614 u[IU]/mL — ABNORMAL HIGH (ref 0.350–4.500)

## 2018-11-03 LAB — MAGNESIUM: MAGNESIUM: 1.8 mg/dL (ref 1.7–2.4)

## 2018-11-03 LAB — T4, FREE: FREE T4: 1.22 ng/dL (ref 0.82–1.77)

## 2018-11-03 MED ORDER — FUROSEMIDE 10 MG/ML IJ SOLN
40.0000 mg | Freq: Once | INTRAMUSCULAR | Status: AC
  Administered 2018-11-03: 40 mg via INTRAVENOUS
  Filled 2018-11-03: qty 4

## 2018-11-03 MED ORDER — METOPROLOL TARTRATE 25 MG PO TABS
25.0000 mg | ORAL_TABLET | Freq: Three times a day (TID) | ORAL | Status: DC
  Administered 2018-11-03 – 2018-11-04 (×2): 25 mg via ORAL
  Filled 2018-11-03 (×2): qty 1
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
## 2018-11-03 NOTE — Plan of Care (Signed)
  Problem: Education: Goal: Knowledge of General Education information will improve Description Including pain rating scale, medication(s)/side effects and non-pharmacologic comfort measures Outcome: Progressing   Problem: Health Behavior/Discharge Planning: Goal: Ability to manage health-related needs will improve Outcome: Progressing   Problem: Clinical Measurements: Goal: Ability to maintain clinical measurements within normal limits will improve Outcome: Progressing   Problem: Clinical Measurements: Goal: Will remain free from infection Outcome: Progressing   Problem: Clinical Measurements: Goal: Diagnostic test results will improve Outcome: Progressing   Problem: Clinical Measurements: Goal: Respiratory complications will improve Outcome: Progressing   Problem: Clinical Measurements: Goal: Cardiovascular complication will be avoided Outcome: Progressing   Problem: Activity: Goal: Risk for activity intolerance will decrease Outcome: Progressing   Problem: Nutrition: Goal: Adequate nutrition will be maintained Outcome: Progressing   Problem: Coping: Goal: Level of anxiety will decrease Outcome: Progressing
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
## 2018-11-04 ENCOUNTER — Telehealth: Payer: Self-pay | Admitting: Physician Assistant

## 2018-11-04 DIAGNOSIS — Z23 Encounter for immunization: Secondary | ICD-10-CM | POA: Diagnosis not present

## 2018-11-04 LAB — BASIC METABOLIC PANEL
Anion gap: 8 (ref 5–15)
Anion gap: 4 — ABNORMAL LOW (ref 5–15)
BUN: 22 mg/dL (ref 8–23)
BUN: 24 mg/dL — AB (ref 8–23)
CHLORIDE: 102 mmol/L (ref 98–111)
CHLORIDE: 105 mmol/L (ref 98–111)
CO2: 26 mmol/L (ref 22–32)
CO2: 28 mmol/L (ref 22–32)
Calcium: 8 mg/dL — ABNORMAL LOW (ref 8.9–10.3)
Calcium: 8.1 mg/dL — ABNORMAL LOW (ref 8.9–10.3)
Creatinine, Ser: 1.13 mg/dL (ref 0.61–1.24)
Creatinine, Ser: 1.01 mg/dL (ref 0.61–1.24)
GFR calc Af Amer: >60 mL/min (ref >60)
GFR calc non Af Amer: 59 mL/min — ABNORMAL LOW (ref >60)
GFR calc non Af Amer: >60 mL/min (ref >60)
Glucose, Bld: 115 mg/dL — ABNORMAL HIGH (ref 70–99)
Glucose, Bld: 139 mg/dL — ABNORMAL HIGH (ref 70–99)
POTASSIUM: 3.3 mmol/L — AB (ref 3.5–5.1)
Potassium: 3.6 mmol/L (ref 3.5–5.1)
SODIUM: 136 mmol/L (ref 135–145)
SODIUM: 137 mmol/L (ref 135–145)

## 2018-11-04 LAB — CBC
HCT: 24.7 % — ABNORMAL LOW (ref 39.0–52.0)
HEMOGLOBIN: 8 g/dL — AB (ref 13.0–17.0)
MCH: 29.5 pg (ref 26.0–34.0)
MCHC: 32.4 g/dL (ref 30.0–36.0)
MCV: 91.1 fL (ref 80.0–100.0)
Platelets: 239 10*3/uL (ref 150–400)
RBC: 2.71 MIL/uL — AB (ref 4.22–5.81)
RDW: 14.9 % (ref 11.5–15.5)
WBC: 8.2 10*3/uL (ref 4.0–10.5)
nRBC: 0 % (ref 0.0–0.2)

## 2018-11-04 LAB — MAGNESIUM: MAGNESIUM: 1.9 mg/dL (ref 1.7–2.4)

## 2018-11-04 MED ORDER — HAEMOPHILUS B POLYSAC CONJ VAC 10 MCG IJ SOLR
0.5000 mL | Freq: Once | INTRAMUSCULAR | Status: AC
  Administered 2018-11-04: 0.5 mL via INTRAMUSCULAR
  Filled 2018-11-04: qty 0.5

## 2018-11-04 MED ORDER — METOPROLOL TARTRATE 37.5 MG PO TABS: 37.5000 mg | ORAL_TABLET | Freq: Two times a day (BID) | ORAL | 0 refills | Status: DC

## 2018-11-04 MED ORDER — MENINGOCOCCAL A C Y&W-135 OLIG IM SOLR
0.5000 mL | Freq: Once | INTRAMUSCULAR | Status: AC
  Administered 2018-11-04: 0.5 mL via INTRAMUSCULAR
  Filled 2018-11-04: qty 0.5

## 2018-11-04 MED ORDER — PNEUMOCOCCAL VAC POLYVALENT 25 MCG/0.5ML IJ INJ
0.5000 mL | INJECTION | INTRAMUSCULAR | Status: AC
  Administered 2018-11-04: 0.5 mL via INTRAMUSCULAR
  Filled 2018-11-04: qty 0.5
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
## 2018-11-04 NOTE — Progress Notes (Signed)
Patient given discharge instructions, no printed prescriptions to give. No equipment to give. IV removed. Belongings with patient at time of d/c. Patient taken to car via wheelchair with son present.

## 2018-11-04 NOTE — Discharge Instructions (Signed)
Splenic Injury A splenic injury is an injury of the spleen. The spleen is an organ located in the upper left area of your abdomen, just under your ribs. Your spleen filters and cleans your blood. It also stores blood cells and destroys cells that are worn out. Your spleen is also important for fighting disease. Splenic injuries can vary. In some cases, the spleen may only be bruised with some bleeding inside the covering and around the spleen. Splenic injuries may also cause a deep tear or cut into the spleen (lacerated spleen). Some splenic injuries can cause the spleen to break open (rupture). What are the causes? Splenic injuries can be caused by a direct blow (blunt trauma) from:  Car accidents.  Contact sports.  Falls.  Gunshot wounds or knife wounds (penetrating injuries) can also cause a splenic injury. What increases the risk? You may be at greater risk for a splenic injury if you have a disease that can cause the spleen to become enlarged. These include:  Alcoholic liver disease.  Viral infections, especially mononucleosis.  What are the signs or symptoms? A minor splenic injury often causes no symptoms or only minor abdominal pain. If the injury causes severe bleeding, your blood pressure may rapidly decrease. This may cause:  Dizziness or light-headedness.  Rapid heart rate.  Difficulty breathing.  Fainting.  Sweating with clammy skin.  Other signs and symptoms of a splenic injury can include:  Very bad abdominal pain.  Pain in the left shoulder.  Pain when the abdomen is pressed (tenderness).  Nausea.  Swelling or bruising of the abdomen.  How is this diagnosed? Your health care provider may suspect a splenic injury based on your signs and symptoms, especially if you were recently in an accident or you recently got hurt. Your health care provider will do a physical exam. Imaging tests may be done to confirm the diagnosis. These may  include:  Ultrasound.  CT scan.  You may have frequent blood tests for a few days after the injury to monitor your condition. How is this treated? Treatment depends on the type of splenic injury you have and how bad it is. Your health care provider will develop a treatment plan specific to your needs.  Less severe injuries may be treated with: ? Observation. ? Interventional radiology. This involves using flexible tubes (catheters) to stop the bleeding from inside the blood vessel.  More severe injuries may require hospitalization in the intensive care unit (ICU). While you are in the ICU: ? Your fluid and blood levels will be monitored closely. ? You will get fluids through an IV tube as needed. ? You may need follow-up scans to check whether your spleen is able to heal itself. If the injury is getting worse, you may need surgery. ? You may receive donated blood (transfusion). ? You may have a long needle inserted into your abdomen to remove any blood that has collected inside the spleen (hematoma).  Surgery. If your blood pressure is too low, you may need emergency surgery. This may include: ? Repairing a laceration. ? Removing part of the spleen. ? Removing the entire spleen (splenectomy).  Follow these instructions at home:  Take medicines only as directed by your health care provider.  Rest at home.  Do not participate in any strenuous activity until your health care provider says it is safe to do so.  Do not lift anything that is heavier than 10 lb (4.5 kg).  Do not participate in contact sports  until your health care provider says it is safe to do so.  Stay up-to-date on vaccinations as told by your health care provider. Contact a health care provider if:  You have a fever.  You have new or increasing pain in your abdomen or in your left shoulder. Get help right away if:  You have signs or symptoms of internal bleeding. Watch  for: ? Sweating. ? Dizziness. ? Weakness. ? Cold and clammy skin. ? Fainting.  You have chest pain or difficulty breathing. This information is not intended to replace advice given to you by your health care provider. Make sure you discuss any questions you have with your health care provider. Document Released: 09/23/2006 Document Revised: 07/30/2016 Document Reviewed: 08/17/2014 Elsevier Interactive Patient Education  2017 [employer].  Embolization, Care After Refer to this sheet in the next few weeks. These instructions provide you with information on caring for yourself after your procedure. Your health care provider may also give you more specific instructions. Your treatment has been planned according to current medical practices, but problems sometimes occur. Call your health care provider if you have any problems or questions after your procedure. What can I expect after the procedure? After your procedure, it is typical to have the following:  Pain, swelling, or bruising at the puncture site.  Headache.  Loss of appetite, nausea, or vomiting.  Fever.  Follow these instructions at home:  Take medicine only as directed by your health care provider.  You can eat what you usually do, but be sure to include lots of fruits, vegetables, and whole grains in your diet. This helps prevent constipation.  Drink enough fluids to keep your urine clear or pale yellow.  Take two 10-minute walks each day.  Do not lift anything heavier than 10 pounds for 1 week after the procedure.  Do not drive for 24 hours after the procedure.  Do not take a bath for 5 days after the procedure. It is okay to take a shower.  You may be able to get back to all your normal activities within 1 week after your procedure. Ask your health care provider when you can return to sexual activity.  Keep all your follow-up appointments. Contact a health care provider if:  Your pain medicine is not  helping.  You have a fever.  You have nausea or vomiting.  You have new bruising or swelling in your groin. Get help right away if:  You have a fever or persistent symptoms for longer than 3 days.  You have pain in your legs.  You develop swelling and discoloration in your legs.  Your legs become pale and cold or blue.  You develop shortness of breath, feel faint, or pass out.  You have chest pain or trouble breathing.  You develop a cough or cough up blood.  You develop a rash.  You have side effects from your medicine.  You feel weak or have trouble moving your arms or legs.  You have balance problems.  You have speech or vision problems. This information is not intended to replace advice given to you by your health care provider. Make sure you discuss any questions you have with your health care provider. Document Released: 12/07/2013 Document Revised: 05/09/2016 Document Reviewed: 11/02/2013 Elsevier Interactive Patient Education  2017 [employer].
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
## 2018-11-04 NOTE — Telephone Encounter (Signed)
° ° °  TOC appointment requested by Roby Lofts PA  Appointment made with [place] for 12/5

## 2018-11-04 NOTE — Telephone Encounter (Signed)
Patient currently admitted at this time. 

## 2018-11-04 NOTE — Care Management Important Message (Signed)
Important Message  Patient Details  Name: MAXIMOS ZAYAS MRN: [id number] Date of Birth: [birth year]   Medicare Important Message Given:  Yes    Jhamir Pickup [address] 11/04/2018, 11:52 AM

## 2018-11-05 NOTE — Telephone Encounter (Signed)
Patient contacted regarding discharge from [facility] on 11/04/18.   Patient understands to follow up with provider ? On 11/19/18 at [place] at [place].  Patient understands discharge instructions? Yes   Patient understands medications and regiment? Yes Patient understands to bring all medications to this visit? Yes

## 2018-11-07 DIAGNOSIS — K661 Hemoperitoneum: Secondary | ICD-10-CM | POA: Insufficient documentation

## 2018-11-07 DIAGNOSIS — R55 Syncope and collapse: Secondary | ICD-10-CM | POA: Insufficient documentation

## 2018-11-10 ENCOUNTER — Observation Stay (HOSPITAL_COMMUNITY)
Admission: EM | Admit: 2018-11-10 | Discharge: 2018-11-12 | Disposition: A | Discharge status: Home / Self Care | Payer: Medicare HMO | Attending: General Surgery | Admitting: General Surgery

## 2018-11-10 ENCOUNTER — Ambulatory Visit (INDEPENDENT_AMBULATORY_CARE_PROVIDER_SITE_OTHER): Payer: Medicare HMO | Admitting: Family Medicine

## 2018-11-10 ENCOUNTER — Encounter (HOSPITAL_COMMUNITY): Payer: Self-pay

## 2018-11-10 ENCOUNTER — Emergency Department: Payer: Medicare HMO

## 2018-11-10 ENCOUNTER — Encounter: Payer: Self-pay | Admitting: Emergency Medicine

## 2018-11-10 ENCOUNTER — Emergency Department
Admission: EM | Admit: 2018-11-10 | Discharge: 2018-11-10 | Disposition: A | Discharge status: Other Acute Inpatient Hospital | Payer: Medicare HMO | Attending: Emergency Medicine | Admitting: Emergency Medicine

## 2018-11-10 ENCOUNTER — Other Ambulatory Visit: Payer: Self-pay

## 2018-11-10 ENCOUNTER — Encounter: Payer: Self-pay | Admitting: Family Medicine

## 2018-11-10 VITALS — BP 102/58 | HR 99 | Temp 97.8°F | Ht 73.0 in | Wt 206.1 lb

## 2018-11-10 DIAGNOSIS — R2243 Localized swelling, mass and lump, lower limb, bilateral: Secondary | ICD-10-CM | POA: Diagnosis not present

## 2018-11-10 DIAGNOSIS — I959 Hypotension, unspecified: Secondary | ICD-10-CM | POA: Diagnosis not present

## 2018-11-10 DIAGNOSIS — N183 Chronic kidney disease, stage 3 unspecified: Secondary | ICD-10-CM | POA: Diagnosis present

## 2018-11-10 DIAGNOSIS — I5022 Chronic systolic (congestive) heart failure: Secondary | ICD-10-CM | POA: Diagnosis not present

## 2018-11-10 DIAGNOSIS — J90 Pleural effusion, not elsewhere classified: Secondary | ICD-10-CM | POA: Diagnosis not present

## 2018-11-10 DIAGNOSIS — Z87891 Personal history of nicotine dependence: Secondary | ICD-10-CM | POA: Diagnosis not present

## 2018-11-10 DIAGNOSIS — S36039D Unspecified laceration of spleen, subsequent encounter: Secondary | ICD-10-CM | POA: Diagnosis not present

## 2018-11-10 DIAGNOSIS — R17 Unspecified jaundice: Secondary | ICD-10-CM

## 2018-11-10 DIAGNOSIS — I499 Cardiac arrhythmia, unspecified: Secondary | ICD-10-CM

## 2018-11-10 DIAGNOSIS — D62 Acute posthemorrhagic anemia: Secondary | ICD-10-CM | POA: Diagnosis not present

## 2018-11-10 DIAGNOSIS — E861 Hypovolemia: Secondary | ICD-10-CM

## 2018-11-10 DIAGNOSIS — R600 Localized edema: Secondary | ICD-10-CM

## 2018-11-10 DIAGNOSIS — E785 Hyperlipidemia, unspecified: Secondary | ICD-10-CM | POA: Insufficient documentation

## 2018-11-10 DIAGNOSIS — R918 Other nonspecific abnormal finding of lung field: Secondary | ICD-10-CM | POA: Diagnosis not present

## 2018-11-10 DIAGNOSIS — I4891 Unspecified atrial fibrillation: Secondary | ICD-10-CM | POA: Diagnosis not present

## 2018-11-10 DIAGNOSIS — N1831 Chronic kidney disease, stage 3a: Secondary | ICD-10-CM | POA: Diagnosis present

## 2018-11-10 DIAGNOSIS — S36029S Unspecified contusion of spleen, sequela: Secondary | ICD-10-CM | POA: Diagnosis not present

## 2018-11-10 DIAGNOSIS — D649 Anemia, unspecified: Secondary | ICD-10-CM

## 2018-11-10 DIAGNOSIS — E876 Hypokalemia: Secondary | ICD-10-CM | POA: Insufficient documentation

## 2018-11-10 DIAGNOSIS — Z888 Allergy status to other drugs, medicaments and biological substances status: Secondary | ICD-10-CM | POA: Diagnosis not present

## 2018-11-10 DIAGNOSIS — I509 Heart failure, unspecified: Secondary | ICD-10-CM

## 2018-11-10 DIAGNOSIS — K661 Hemoperitoneum: Secondary | ICD-10-CM | POA: Diagnosis not present

## 2018-11-10 DIAGNOSIS — I259 Chronic ischemic heart disease, unspecified: Secondary | ICD-10-CM | POA: Insufficient documentation

## 2018-11-10 DIAGNOSIS — I2510 Atherosclerotic heart disease of native coronary artery without angina pectoris: Secondary | ICD-10-CM | POA: Diagnosis not present

## 2018-11-10 DIAGNOSIS — R531 Weakness: Secondary | ICD-10-CM

## 2018-11-10 DIAGNOSIS — R140 Abdominal distension (gaseous): Secondary | ICD-10-CM | POA: Diagnosis not present

## 2018-11-10 DIAGNOSIS — M79662 Pain in left lower leg: Secondary | ICD-10-CM | POA: Diagnosis not present

## 2018-11-10 DIAGNOSIS — M79661 Pain in right lower leg: Secondary | ICD-10-CM | POA: Diagnosis not present

## 2018-11-10 DIAGNOSIS — I5021 Acute systolic (congestive) heart failure: Secondary | ICD-10-CM

## 2018-11-10 DIAGNOSIS — D735 Infarction of spleen: Secondary | ICD-10-CM

## 2018-11-10 DIAGNOSIS — I482 Chronic atrial fibrillation, unspecified: Secondary | ICD-10-CM | POA: Insufficient documentation

## 2018-11-10 DIAGNOSIS — D7389 Other diseases of spleen: Secondary | ICD-10-CM

## 2018-11-10 DIAGNOSIS — I712 Thoracic aortic aneurysm, without rupture: Secondary | ICD-10-CM | POA: Insufficient documentation

## 2018-11-10 DIAGNOSIS — J811 Chronic pulmonary edema: Secondary | ICD-10-CM | POA: Diagnosis not present

## 2018-11-10 DIAGNOSIS — I9589 Other hypotension: Secondary | ICD-10-CM | POA: Diagnosis not present

## 2018-11-10 DIAGNOSIS — M7989 Other specified soft tissue disorders: Secondary | ICD-10-CM | POA: Diagnosis not present

## 2018-11-10 DIAGNOSIS — X58XXXD Exposure to other specified factors, subsequent encounter: Secondary | ICD-10-CM | POA: Diagnosis not present

## 2018-11-10 DIAGNOSIS — I491 Atrial premature depolarization: Secondary | ICD-10-CM

## 2018-11-10 DIAGNOSIS — I471 Supraventricular tachycardia: Secondary | ICD-10-CM | POA: Diagnosis not present

## 2018-11-10 DIAGNOSIS — S2232XA Fracture of one rib, left side, initial encounter for closed fracture: Secondary | ICD-10-CM | POA: Diagnosis not present

## 2018-11-10 LAB — PROTIME-INR
INR: 1.13
PROTHROMBIN TIME: 14.4 s (ref 11.4–15.2)

## 2018-11-10 LAB — COMPREHENSIVE METABOLIC PANEL
ALT: 36 U/L (ref 0–44)
ANION GAP: 11 (ref 5–15)
AST: 39 U/L (ref 15–41)
Albumin: 3.2 g/dL — ABNORMAL LOW (ref 3.5–5.0)
Alkaline Phosphatase: 51 U/L (ref 38–126)
BILIRUBIN TOTAL: 2.9 mg/dL — AB (ref 0.3–1.2)
BUN: 22 mg/dL (ref 8–23)
CO2: 27 mmol/L (ref 22–32)
Calcium: 8.3 mg/dL — ABNORMAL LOW (ref 8.9–10.3)
Chloride: 97 mmol/L — ABNORMAL LOW (ref 98–111)
Creatinine, Ser: 1.04 mg/dL (ref 0.61–1.24)
Glucose, Bld: 136 mg/dL — ABNORMAL HIGH (ref 70–99)
Potassium: 3.2 mmol/L — ABNORMAL LOW (ref 3.5–5.1)
Sodium: 135 mmol/L (ref 135–145)
Total Protein: 6.6 g/dL (ref 6.5–8.1)

## 2018-11-10 LAB — CBC WITH DIFFERENTIAL/PLATELET
Abs Immature Granulocytes: 0.06 10*3/uL (ref 0.00–0.07)
Basophils Absolute: 0.1 10*3/uL (ref 0.0–0.1)
Basophils Relative: 1 %
Eosinophils Absolute: 0.2 10*3/uL (ref 0.0–0.5)
Eosinophils Relative: 2 %
HEMATOCRIT: 28.5 % — AB (ref 39.0–52.0)
Hemoglobin: 8.8 g/dL — ABNORMAL LOW (ref 13.0–17.0)
Immature Granulocytes: 1 %
LYMPHS ABS: 1 10*3/uL (ref 0.7–4.0)
Lymphocytes Relative: 10 %
MCH: 29 pg (ref 26.0–34.0)
MCHC: 30.9 g/dL (ref 30.0–36.0)
MCV: 94.1 fL (ref 80.0–100.0)
MONOS PCT: 8 %
Monocytes Absolute: 0.9 10*3/uL (ref 0.1–1.0)
NEUTROS ABS: 8.7 10*3/uL — AB (ref 1.7–7.7)
NEUTROS PCT: 78 %
PLATELETS: 440 10*3/uL — AB (ref 150–400)
RBC: 3.03 MIL/uL — ABNORMAL LOW (ref 4.22–5.81)
RDW: 14.8 % (ref 11.5–15.5)
WBC: 10.9 10*3/uL — ABNORMAL HIGH (ref 4.0–10.5)
nRBC: 0 % (ref 0.0–0.2)

## 2018-11-10 LAB — BRAIN NATRIURETIC PEPTIDE: B Natriuretic Peptide: 255 pg/mL — ABNORMAL HIGH (ref 0.0–100.0)

## 2018-11-10 LAB — MAGNESIUM: Magnesium: 2 mg/dL (ref 1.7–2.4)

## 2018-11-10 LAB — TROPONIN I

## 2018-11-10 MED ORDER — LEVOFLOXACIN IN D5W 750 MG/150ML IV SOLN
750.0000 mg | Freq: Once | INTRAVENOUS | Status: AC
  Administered 2018-11-10: 750 mg via INTRAVENOUS
  Filled 2018-11-10: qty 150

## 2018-11-10 MED ORDER — MAGNESIUM SULFATE 2 GM/50ML IV SOLN
2.0000 g | Freq: Once | INTRAVENOUS | Status: AC
  Administered 2018-11-10: 2 g via INTRAVENOUS
  Filled 2018-11-10: qty 50

## 2018-11-10 MED ORDER — ACETAMINOPHEN 325 MG PO TABS: 650.0000 mg | ORAL_TABLET | ORAL | Status: DC | PRN

## 2018-11-10 MED ORDER — SODIUM CHLORIDE 0.9% FLUSH
3.0000 mL | Freq: Two times a day (BID) | INTRAVENOUS | Status: DC
  Administered 2018-11-10 – 2018-11-12 (×4): 3 mL via INTRAVENOUS

## 2018-11-10 MED ORDER — SODIUM CHLORIDE 0.9% FLUSH: 3.0000 mL | INTRAVENOUS | Status: DC | PRN

## 2018-11-10 MED ORDER — DICLOFENAC SODIUM 1 % TD GEL
4.0000 g | Freq: Four times a day (QID) | TRANSDERMAL | Status: DC
  Administered 2018-11-10 – 2018-11-12 (×6): 4 g via TOPICAL
  Filled 2018-11-10 (×2): qty 100

## 2018-11-10 MED ORDER — GUAIFENESIN ER 600 MG PO TB12
1200.0000 mg | ORAL_TABLET | Freq: Every day | ORAL | Status: DC
  Administered 2018-11-10 – 2018-11-12 (×3): 1200 mg via ORAL
  Filled 2018-11-10 (×3): qty 2

## 2018-11-10 MED ORDER — FUROSEMIDE 10 MG/ML IJ SOLN
40.0000 mg | Freq: Once | INTRAMUSCULAR | Status: AC
  Administered 2018-11-10: 40 mg via INTRAVENOUS
  Filled 2018-11-10: qty 4

## 2018-11-10 MED ORDER — POTASSIUM CHLORIDE 10 MEQ/100ML IV SOLN
10.0000 meq | INTRAVENOUS | Status: AC
  Administered 2018-11-10 – 2018-11-11 (×6): 10 meq via INTRAVENOUS
  Filled 2018-11-10 (×5): qty 100

## 2018-11-10 MED ORDER — METOPROLOL TARTRATE 12.5 MG HALF TABLET
37.5000 mg | ORAL_TABLET | Freq: Two times a day (BID) | ORAL | Status: DC
  Administered 2018-11-10 – 2018-11-12 (×4): 37.5 mg via ORAL
  Filled 2018-11-10 (×5): qty 3

## 2018-11-10 MED ORDER — POLYVINYL ALCOHOL 1.4 % OP SOLN
1.0000 [drp] | OPHTHALMIC | Status: DC | PRN
  Filled 2018-11-10: qty 15

## 2018-11-10 MED ORDER — SODIUM CHLORIDE 0.9 % IV SOLN: 250.0000 mL | INTRAVENOUS | Status: DC | PRN

## 2018-11-10 MED ORDER — DOCUSATE SODIUM 100 MG PO CAPS
100.0000 mg | ORAL_CAPSULE | Freq: Two times a day (BID) | ORAL | Status: DC
  Administered 2018-11-10 – 2018-11-12 (×4): 100 mg via ORAL
  Filled 2018-11-10 (×4): qty 1

## 2018-11-10 MED ORDER — IOPAMIDOL (ISOVUE-300) INJECTION 61%
100.0000 mL | Freq: Once | INTRAVENOUS | Status: AC | PRN
  Administered 2018-11-10: 100 mL via INTRAVENOUS

## 2018-11-10 MED ORDER — SIMVASTATIN 40 MG PO TABS
40.0000 mg | ORAL_TABLET | Freq: Every evening | ORAL | Status: DC
  Administered 2018-11-10 – 2018-11-11 (×2): 40 mg via ORAL
  Filled 2018-11-10: qty 1
  Filled 2018-11-10: qty 2
  Filled 2018-11-10 (×2): qty 1

## 2018-11-10 MED ORDER — FUROSEMIDE 20 MG PO TABS
20.0000 mg | ORAL_TABLET | Freq: Every day | ORAL | Status: DC
  Administered 2018-11-11 – 2018-11-12 (×2): 20 mg via ORAL
  Filled 2018-11-10 (×2): qty 1

## 2018-11-10 NOTE — Patient Instructions (Signed)
Please go now to the emergency department for evaluation

## 2018-11-10 NOTE — ED Notes (Signed)
Aaron Edelman from Lab states Mg+ can be added to blood in lab

## 2018-11-10 NOTE — ED Notes (Signed)
Patient returned from xray.

## 2018-11-10 NOTE — ED Notes (Signed)
Family at bedside. 
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
## 2018-11-10 NOTE — ED Triage Notes (Signed)
Pt was admitted to the hospital for a spleen hematoma 11/14. Pt states he started urinated blood last Thursday but also report new ankle swelling, and rash to pt. Pt went to his PCP today who noticed hypotension upon assessment and wanted pt to be evaluated in the ED for possible post op complications. Pt reports he has not had a bowel movement since d/c 1 week prior. Pt denies pain. Pt hypotensive in triage 90/48 on third reading.
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
## 2018-11-10 NOTE — ED Notes (Signed)
Report to [place] RN from [facility], ETA 20 minutes.

## 2018-11-10 NOTE — ED Notes (Signed)
[facility] called @ [phone]) spoke with receiving nurse Rometta Emery. Aware patient will be arriving via [facility] with the our.

## 2018-11-10 NOTE — ED Notes (Signed)
EMTALA reviewed by this RN.  

## 2018-11-10 NOTE — ED Notes (Signed)
[facility] called [phone]) report to
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
## 2018-11-10 NOTE — ED Notes (Signed)
Attempted to call report

## 2018-11-10 NOTE — ED Notes (Signed)
Patient off unit to CT

## 2018-11-10 NOTE — ED Notes (Signed)
Patient sent from PCP office for further eval. Recently discharged from hospital s/p speen surgery// patient reports feeling weak. Present pale and hypotensive,

## 2018-11-10 NOTE — ED Notes (Signed)
Md at bedside to assess and plan care.

## 2018-11-11 ENCOUNTER — Observation Stay (HOSPITAL_BASED_OUTPATIENT_CLINIC_OR_DEPARTMENT_OTHER): Payer: Medicare HMO

## 2018-11-11 ENCOUNTER — Observation Stay (HOSPITAL_COMMUNITY): Payer: Medicare HMO

## 2018-11-11 DIAGNOSIS — R600 Localized edema: Secondary | ICD-10-CM | POA: Diagnosis not present

## 2018-11-11 DIAGNOSIS — R17 Unspecified jaundice: Secondary | ICD-10-CM | POA: Diagnosis not present

## 2018-11-11 DIAGNOSIS — I471 Supraventricular tachycardia: Secondary | ICD-10-CM

## 2018-11-11 DIAGNOSIS — D735 Infarction of spleen: Secondary | ICD-10-CM | POA: Diagnosis not present

## 2018-11-11 DIAGNOSIS — I350 Nonrheumatic aortic (valve) stenosis: Secondary | ICD-10-CM | POA: Diagnosis not present

## 2018-11-11 DIAGNOSIS — I491 Atrial premature depolarization: Secondary | ICD-10-CM

## 2018-11-11 DIAGNOSIS — I5042 Chronic combined systolic (congestive) and diastolic (congestive) heart failure: Secondary | ICD-10-CM | POA: Diagnosis not present

## 2018-11-11 DIAGNOSIS — S36029S Unspecified contusion of spleen, sequela: Secondary | ICD-10-CM | POA: Diagnosis not present

## 2018-11-11 DIAGNOSIS — S299XXA Unspecified injury of thorax, initial encounter: Secondary | ICD-10-CM | POA: Diagnosis not present

## 2018-11-11 LAB — CBC
HCT: 25.6 % — ABNORMAL LOW (ref 39.0–52.0)
HCT: 27.2 % — ABNORMAL LOW (ref 39.0–52.0)
Hemoglobin: 7.8 g/dL — ABNORMAL LOW (ref 13.0–17.0)
Hemoglobin: 8.5 g/dL — ABNORMAL LOW (ref 13.0–17.0)
MCH: 28.6 pg (ref 26.0–34.0)
MCH: 29.1 pg (ref 26.0–34.0)
MCHC: 30.5 g/dL (ref 30.0–36.0)
MCHC: 31.3 g/dL (ref 30.0–36.0)
MCV: 93.8 fL (ref 80.0–100.0)
MCV: 93.2 fL (ref 80.0–100.0)
NRBC: 0 % (ref 0.0–0.2)
NRBC: 0 % (ref 0.0–0.2)
PLATELETS: 381 10*3/uL (ref 150–400)
Platelets: 392 10*3/uL (ref 150–400)
RBC: 2.73 MIL/uL — ABNORMAL LOW (ref 4.22–5.81)
RBC: 2.92 MIL/uL — ABNORMAL LOW (ref 4.22–5.81)
RDW: 14.8 % (ref 11.5–15.5)
RDW: 14.6 % (ref 11.5–15.5)
WBC: 7.7 10*3/uL (ref 4.0–10.5)
WBC: 8.1 10*3/uL (ref 4.0–10.5)

## 2018-11-11 LAB — ECHOCARDIOGRAM COMPLETE
Height: 73 in
Weight: 3287.5 oz

## 2018-11-11 LAB — PROCALCITONIN: Procalcitonin: <0.1 ng/mL

## 2018-11-11 LAB — COMPREHENSIVE METABOLIC PANEL
ALBUMIN: 2.4 g/dL — AB (ref 3.5–5.0)
ALT: 28 U/L (ref 0–44)
AST: 32 U/L (ref 15–41)
Alkaline Phosphatase: 43 U/L (ref 38–126)
Anion gap: 9 (ref 5–15)
BUN: 17 mg/dL (ref 8–23)
CO2: 27 mmol/L (ref 22–32)
CREATININE: 1.18 mg/dL (ref 0.61–1.24)
Calcium: 7.9 mg/dL — ABNORMAL LOW (ref 8.9–10.3)
Chloride: 99 mmol/L (ref 98–111)
GFR calc Af Amer: >60 mL/min (ref >60)
GFR, EST NON AFRICAN AMERICAN: 58 mL/min — AB (ref >60)
Glucose, Bld: 99 mg/dL (ref 70–99)
Potassium: 3.6 mmol/L (ref 3.5–5.1)
Sodium: 135 mmol/L (ref 135–145)
TOTAL PROTEIN: 5.2 g/dL — AB (ref 6.5–8.1)
Total Bilirubin: 2.3 mg/dL — ABNORMAL HIGH (ref 0.3–1.2)

## 2018-11-11 LAB — TSH: TSH: 3.722 u[IU]/mL (ref 0.350–4.500)

## 2018-11-11 LAB — MAGNESIUM: MAGNESIUM: 2.2 mg/dL (ref 1.7–2.4)
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
## 2018-11-11 NOTE — Progress Notes (Signed)
Telemetry called regarding 9 beats of vtach, pt alert and oriented, asymptomatic, no complain of pain, will continue to monitor

## 2018-11-11 NOTE — Progress Notes (Deleted)
Pt/family has accepted SNF bed at [facility].  Facility to start insurance authorization for admission today; [facility] Rossetti will be working on Thanksgiving Day with a limited staff.  Faxed PT/OT and progress notes to admissions at [facility].    Per Alyse Low at [facility], [facility] admissions coverage at facility will be Cecile Sheerer, phone [phone], and they will be admitting patients on a normal schedule.  Passed on CSW coverage information to admissions department for Thanksgiving Holiday.  Will follow with updates as available.  Reinaldo Raddle, RN, BSN  Trauma/Neuro ICU Case Manager [phone]

## 2018-11-11 NOTE — Progress Notes (Signed)
Telemetry called regarding 5 beats of vtach, pt alert and oriented, asymptomatic, no complain of pain, will continue to monitor.
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
## 2018-11-11 NOTE — Progress Notes (Signed)
  Echocardiogram 2D Echocardiogram has been performed.  Marybelle Killings 11/11/2018, 2:43 PM
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
## 2018-11-12 DIAGNOSIS — I5042 Chronic combined systolic (congestive) and diastolic (congestive) heart failure: Secondary | ICD-10-CM | POA: Diagnosis not present

## 2018-11-12 DIAGNOSIS — D735 Infarction of spleen: Secondary | ICD-10-CM | POA: Diagnosis not present

## 2018-11-12 DIAGNOSIS — R600 Localized edema: Secondary | ICD-10-CM

## 2018-11-12 DIAGNOSIS — S36029S Unspecified contusion of spleen, sequela: Secondary | ICD-10-CM | POA: Diagnosis not present

## 2018-11-12 DIAGNOSIS — R17 Unspecified jaundice: Secondary | ICD-10-CM | POA: Diagnosis not present

## 2018-11-12 LAB — BASIC METABOLIC PANEL
Anion gap: 9 (ref 5–15)
BUN: 12 mg/dL (ref 8–23)
CHLORIDE: 99 mmol/L (ref 98–111)
CO2: 26 mmol/L (ref 22–32)
Calcium: 7.8 mg/dL — ABNORMAL LOW (ref 8.9–10.3)
Creatinine, Ser: 0.99 mg/dL (ref 0.61–1.24)
GFR calc non Af Amer: >60 mL/min (ref >60)
Glucose, Bld: 145 mg/dL — ABNORMAL HIGH (ref 70–99)
POTASSIUM: 3.4 mmol/L — AB (ref 3.5–5.1)
Sodium: 134 mmol/L — ABNORMAL LOW (ref 135–145)

## 2018-11-12 LAB — CBC
HEMATOCRIT: 26.1 % — AB (ref 39.0–52.0)
HEMOGLOBIN: 8.1 g/dL — AB (ref 13.0–17.0)
MCH: 28.5 pg (ref 26.0–34.0)
MCHC: 31 g/dL (ref 30.0–36.0)
MCV: 91.9 fL (ref 80.0–100.0)
Platelets: 373 10*3/uL (ref 150–400)
RBC: 2.84 MIL/uL — AB (ref 4.22–5.81)
RDW: 14.5 % (ref 11.5–15.5)
WBC: 7.5 10*3/uL (ref 4.0–10.5)
nRBC: 0 % (ref 0.0–0.2)
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
## 2018-11-12 NOTE — Progress Notes (Signed)
Discharged home today accompanied by patient's son. Discharged instructions, personal belongings given to patient. Verbalized understanding of instructions
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
## 2018-11-12 NOTE — Discharge Summary (Signed)
Physician Discharge Summary  Patient ID: EDILSON VITAL MRN: [id number] DOB/AGE: Dec 10, 1935 83 y.o.  PCP: Arnetha Courser, MD  Admit date: 11/10/2018 Discharge date: 11/12/2018  Admission Diagnoses:  Peripheral edema and concern about splenic laceration  Discharge Diagnoses:  same  Active Problems:   Chronic kidney disease (CKD), stage III (moderate) (HCC)   Coronary artery disease involving native coronary artery of native heart without angina pectoris   Rosanna Randy syndrome   Hemoperitoneum   Splenic laceration, subsequent encounter   Acute CHF (congestive heart failure) (HCC)   Multifocal atrial tachycardia (HCC)   Bilateral leg edema   Surgery:  none  Discharged Condition: improved  Hospital Course:   Patient was readmitted after he saw his primary care physician about peripheral edema and was sent to [facility] ER and then to [facility].  Recent ultrasound of lower extremity showed no DVT.  He does have some fluid in his abdomen consistent with the remote bleeding from his spleen that was later embolized.  He and his son wanted discharge today.  PT evaluated and assisted with walking and he is felt to be able to be discharged home today.    Consults: cardiology with Dr. Percival Spanish  Significant Diagnostic Studies: CT    Discharge Exam: Blood pressure (!) 98/58, pulse 86, temperature 98.5 F (36.9 C), temperature source Oral, resp. rate 18, height 6\' 1"  (1.854 m), weight 93.2 kg, SpO2 96 %. Abdomen is nontender  Will ask that he go home and take iron and vitamin c  Disposition: Discharge disposition: 01-Home or Self Care       Discharge Instructions    (HEART FAILURE PATIENTS) Call MD:  Anytime you have any of the following symptoms: 1) 3 pound weight gain in 24 hours or 5 pounds in 1 week 2) shortness of breath, with or without a dry hacking cough 3) swelling in the hands, feet or stomach 4) if you have to sleep on extra pillows at night in order to breathe.   Complete by:   As directed    Diet - low sodium heart healthy   Complete by:  As directed    Increase activity slowly   Complete by:  As directed      Allergies as of 11/12/2018      Reactions   Macrolides And Ketolides Other (See Comments)   Prolonged Q-T syndrome   Norvasc [amlodipine] Other (See Comments)   Bradycardia   Avodart [dutasteride] Itching      Medication List    STOP taking these medications   aspirin EC 81 MG tablet     TAKE these medications   B COMPLEX VITAMINS PO Take 1 tablet by mouth daily.   diclofenac sodium 1 % Gel Commonly known as:  VOLTAREN Apply 4 g topically 4 (four) times daily. To the knee if needed   furosemide 20 MG tablet Commonly known as:  LASIX TAKE 1 TABLET EVERY DAY   LUBRICATING EYE DROPS OP Apply 1 drop to eye as needed (dry eyes).   Metoprolol Tartrate 37.5 MG Tabs Take 37.5 mg by mouth 2 (two) times daily.   MUCINEX MAXIMUM STRENGTH 1200 MG Tb12 Generic drug:  Guaifenesin Take 1,200 mg by mouth daily.   simvastatin 40 MG tablet Commonly known as:  ZOCOR TAKE 1 TABLET EVERY DAY  AT  6PM What changed:  See the new instructions.   THERAFLU COLD & SORE THROAT 20-10-325 MG Pack Generic drug:  Pheniramine-PE-APAP Take 1 Package by mouth as needed (flu  symptoms).      Follow-up Information    Lada, Satira Anis, MD Follow up.   Specialty:  Family Medicine Contact information: [address] [place] [address] [phone]        [name], MD .   Specialty:  Cardiology Contact information: [place] [address] [phone]           Signed: Pedro Earls 11/12/2018, 12:12 PM
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
## 2018-11-15 DIAGNOSIS — J90 Pleural effusion, not elsewhere classified: Secondary | ICD-10-CM

## 2018-11-15 HISTORY — DX: Pleural effusion, not elsewhere classified: J90

## 2018-11-18 ENCOUNTER — Encounter: Payer: Self-pay | Admitting: Physician Assistant
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
## 2018-11-19 ENCOUNTER — Other Ambulatory Visit: Payer: Self-pay | Admitting: General Surgery

## 2018-11-19 ENCOUNTER — Encounter: Payer: Self-pay | Admitting: Physician Assistant

## 2018-11-19 ENCOUNTER — Ambulatory Visit (INDEPENDENT_AMBULATORY_CARE_PROVIDER_SITE_OTHER): Payer: Medicare HMO | Admitting: Family Medicine

## 2018-11-19 ENCOUNTER — Ambulatory Visit
Admission: RE | Admit: 2018-11-19 | Discharge: 2018-11-19 | Disposition: A | Discharge status: Home / Self Care | Payer: Medicare HMO | Source: Ambulatory Visit | Attending: Family Medicine | Admitting: Family Medicine

## 2018-11-19 ENCOUNTER — Ambulatory Visit (INDEPENDENT_AMBULATORY_CARE_PROVIDER_SITE_OTHER): Payer: Medicare HMO

## 2018-11-19 ENCOUNTER — Ambulatory Visit: Payer: Medicare HMO | Admitting: Physician Assistant

## 2018-11-19 ENCOUNTER — Ambulatory Visit: Payer: Medicare HMO

## 2018-11-19 ENCOUNTER — Telehealth: Payer: Self-pay | Admitting: Family Medicine

## 2018-11-19 ENCOUNTER — Encounter: Payer: Self-pay | Admitting: Family Medicine

## 2018-11-19 VITALS — BP 95/65 | HR 80 | Ht 73.0 in | Wt 197.2 lb

## 2018-11-19 VITALS — BP 100/58 | HR 94 | Temp 97.7°F | Ht 73.0 in | Wt 196.6 lb

## 2018-11-19 DIAGNOSIS — R05 Cough: Secondary | ICD-10-CM

## 2018-11-19 DIAGNOSIS — I2510 Atherosclerotic heart disease of native coronary artery without angina pectoris: Secondary | ICD-10-CM | POA: Diagnosis not present

## 2018-11-19 DIAGNOSIS — E8809 Other disorders of plasma-protein metabolism, not elsewhere classified: Secondary | ICD-10-CM | POA: Diagnosis not present

## 2018-11-19 DIAGNOSIS — J90 Pleural effusion, not elsewhere classified: Secondary | ICD-10-CM

## 2018-11-19 DIAGNOSIS — I5022 Chronic systolic (congestive) heart failure: Secondary | ICD-10-CM

## 2018-11-19 DIAGNOSIS — I9589 Other hypotension: Secondary | ICD-10-CM

## 2018-11-19 DIAGNOSIS — I428 Other cardiomyopathies: Secondary | ICD-10-CM | POA: Diagnosis not present

## 2018-11-19 DIAGNOSIS — D649 Anemia, unspecified: Secondary | ICD-10-CM | POA: Diagnosis not present

## 2018-11-19 DIAGNOSIS — I5021 Acute systolic (congestive) heart failure: Secondary | ICD-10-CM | POA: Diagnosis not present

## 2018-11-19 DIAGNOSIS — E861 Hypovolemia: Secondary | ICD-10-CM

## 2018-11-19 DIAGNOSIS — R059 Cough, unspecified: Secondary | ICD-10-CM

## 2018-11-19 DIAGNOSIS — R0989 Other specified symptoms and signs involving the circulatory and respiratory systems: Secondary | ICD-10-CM | POA: Diagnosis not present

## 2018-11-19 DIAGNOSIS — I471 Supraventricular tachycardia: Secondary | ICD-10-CM

## 2018-11-19 DIAGNOSIS — I4719 Other supraventricular tachycardia: Secondary | ICD-10-CM

## 2018-11-19 DIAGNOSIS — S36039D Unspecified laceration of spleen, subsequent encounter: Secondary | ICD-10-CM | POA: Diagnosis not present

## 2018-11-19 MED ORDER — METOPROLOL TARTRATE 25 MG PO TABS: 25.0000 mg | ORAL_TABLET | Freq: Two times a day (BID) | ORAL | 3 refills | Status: DC

## 2018-11-19 MED ORDER — DOXYCYCLINE HYCLATE 100 MG PO TABS: 100.0000 mg | ORAL_TABLET | Freq: Two times a day (BID) | ORAL | 0 refills | Status: DC
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
## 2018-11-19 NOTE — Telephone Encounter (Signed)
Spoke with Triage at [facility] office, they stated he was out of the office and not accepting pages this afternoon but stated they would try to find someone to give Dr. Sanda Klein a call as soon as possible. Lada's cell phone number was given.

## 2018-11-19 NOTE — Progress Notes (Signed)
BP (!) 100/58   Pulse 94   Temp 97.7 F (36.5 C) (Oral)   Ht 6\' 1"  (1.854 m)   Wt 196 lb 9.6 oz (89.2 kg)   SpO2 94%   BMI 25.94 kg/m    Subjective:    Patient ID: Anthony Mosley, male    DOB: 1935-07-05, 83 y.o.   MRN: [id number]  HPI: Anthony Mosley is a 83 y.o. male  Chief Complaint  Patient presents with  . Follow-up  . Edema    bilateral ankle swelling    HPI He is here for hospital f/u; splenic laceration, leg edema, CKD, acute CHF, hemoperitoneum Discharge summary reviewed; no DVT on ultrasound Discharge blood pressure was 98/58  PT evaluated him and he was ambulated at the hospital before discharge; I asked if he wanted additional PT; he feels safe at home; just sick of coughing; using mucinex; CXR showed moderate left side pleural effusion on Nov 10, 5944; slimy slick sputum, stringy; no fevers  Reviewed Nov 26th and 27th xrays; first one: CLINICAL DATA:  83 y/o M; edema and weakness. Recent discharge from hospital for spleen surgery.  EXAM: CHEST - 2 VIEW  COMPARISON:  06/03/2017 chest radiograph.  FINDINGS: Stable cardiomediastinal silhouette given projection and technique. Left-sided pleural effusion and basilar opacity. No pneumothorax. Mild degenerative changes of the spine. No acute osseous abnormality is evident. Coils are present in the left upper quadrant.  IMPRESSION: Moderate left-sided pleural effusion and basilar opacity which may represent associated atelectasis or pneumonia.   Electronically Signed   By: Kristine Garbe M.D.   On: 11/10/2018 13:32  Was seen and sent to the ER; ER here at [facility] sent him to trauma center at [facility]; they thought no new bleeding, stable, watched him for 3 days He is weak and skin turgor is poor; not a good appetite; sleeping okay; urine is darker Legs are still swelling; sent home with compression stockings but they are tight and he cut the last pair off; he keeps his legs  elevated at home the best he can; he has a reclining chair and puts a few pillows under his feet They took him off the 81 mg aspirin He is on metoprolol tartrate 37.5 mg BID  He is taking iron and no problems with constipation He goes to see the heart doctor today  Depression screen [facility] 2/9 11/19/2018 11/10/2018 08/24/2018 05/15/2018 05/12/2018  Decreased Interest 0 0 0 0 0  Down, Depressed, Hopeless 0 0 0 0 0  PHQ - 2 Score 0 0 0 0 0  Altered sleeping 0 0 0 1 -  Tired, decreased energy 0 0 0 1 -  Change in appetite 0 0 0 0 -  Feeling bad or failure about yourself  0 0 0 0 -  Trouble concentrating 0 0 0 0 -  Moving slowly or fidgety/restless 0 0 0 0 -  Suicidal thoughts 0 0 0 0 -  PHQ-9 Score 0 0 0 2 -  Difficult doing work/chores Not difficult at all Not difficult at all - Not difficult at all -   Fall Risk  11/19/2018 11/10/2018 08/24/2018 05/15/2018 05/12/2018  Falls in the past year? 0 1 No No No  Number falls in past yr: - 1 - - -  Injury with Fall? - 1 - - -  Risk for fall due to : - - - Impaired vision;Impaired balance/gait -  Risk for fall due to: Comment - - - wears eyeglasses;  unable to see out of L eye; B knee pain -    Relevant past medical, surgical, family and social history reviewed Past Medical History:  Diagnosis Date  . Arthritis   . 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 seen on CT.  [name] CAD (coronary artery disease)    a.  Elective R/LHC 06/12/18 showed mild-moderate nonobstructive RCA CAD with mild LAD myocardial bridging but no significant obstructive CAD; there was mildly elevated L heart filling pressure, moderately elevated R heart filling pressure, and mild pulm HTN.  . Cancer ([place])    SKIN  . Chronic knee pain   . Cough   . Edema    FEET/LEGS  . Former tobacco use   . Hemorrhoids   . Hyperbilirubinemia   . Hyperlipidemia   . Mild aortic insufficiency   . Mild pulmonary hypertension ([place])   . Moderate mitral regurgitation   . NICM (nonischemic cardiomyopathy) ([place])      a. EF 40-45% by echo 05/2017.  [name] PONV (postoperative nausea and vomiting)    after first cataract  . Rosacea   . Ruptured spleen   . Thoracic aortic aneurysm ([place])    a. 4.4cm by CT 12/2017, imaged incompletely at 4.3cm in 10/2018 CT.  [name] Wears dentures    partial upper  . Wheezing    Past Surgical History:  Procedure Laterality Date  . APPENDECTOMY    . CARDIAC CATHETERIZATION  2005  . CARDIAC CATHETERIZATION  2002  . CATARACT EXTRACTION W/PHACO Right 10/24/2015   Procedure: CATARACT EXTRACTION PHACO AND INTRAOCULAR LENS PLACEMENT (IOC);  Surgeon: [name], MD;  Location: [facility] ORS;  Service: Ophthalmology;  Laterality: Right;  [place] 00:52 AP% 23.5 CDE 12.35 fluid pack lot # [id number]  . CATARACT EXTRACTION W/PHACO Left 11/14/2015   Procedure: CATARACT EXTRACTION PHACO AND INTRAOCULAR LENS PLACEMENT (IOC);  Surgeon: [name], MD;  Location: [facility] OR[facility];  Service: Ophthalmology;  Laterality: Left;  [place] 00:55 AP% 19.1 CDE 10.66 fluid pack lot # [id number]  . COLONOSCOPY  1987  . COLONOSCOPY WITH PROPOFOL N/A 08/22/2017   Procedure: COLONOSCOPY WITH PROPOFOL;  Surgeon: [name], MD;  Location: [place];  Service: Gastroenterology;  Laterality: N/A;  . HERNIA REPAIR    . IR ANGIOGRAM VISCERAL SELECTIVE  10/29/2018  . IR EMBO ART  VEN HEMORR LYMPH EXTRAV  INC GUIDE ROADMAPPING  10/29/2018  . IR US GUIDE VASC ACCESS RIGHT  10/29/2018  . KNEE ARTHROSCOPY    . POLYPECTOMY  08/22/2017   Procedure: POLYPECTOMY;  Surgeon: [name], MD;  Location: [place];  Service: Gastroenterology;;  . RIGHT/LEFT HEART CATH AND CORONARY ANGIOGRAPHY N/A 06/13/2017   Procedure: Right/Left Heart Cath and Coronary Angiography;  Surgeon: [name], MD;  Location: [place] [facility];  Service: Cardiovascular;  Laterality: N/A;  . TONSILLECTOMY     Family History  Problem Relation Age of Onset  . Alzheimer's disease Mother   . Pulmonary embolism Father   . Diabetes  Father   . Heart attack Father 57  . Diabetes Brother   . Heart disease Brother        CABG  . Heart disease Brother        CABG  . Diabetes Brother   . Healthy Brother    Social History   Tobacco Use  . Smoking status: Former Smoker    Packs/day: 1.50    Years: 20.00    Pack years: 30.00    Types: Cigarettes  Last attempt to quit: 1987    Years since quitting: 32.9  . Smokeless tobacco: Former Systems developer    Types: Chew    Quit date: 2000  . Tobacco comment: smoking cessation materials not required  Substance Use Topics  . Alcohol use: No    Alcohol/week: 0.0 standard drinks  . Drug use: No     Office Visit from 11/19/2018 in [facility]  AUDIT-C Score  0      Interim medical history since last visit reviewed. Allergies and medications reviewed  Review of Systems Per HPI unless specifically indicated above     Objective:    BP (!) 100/58   Pulse 94   Temp 97.7 F (36.5 C) (Oral)   Ht 6\' 1"  (1.854 m)   Wt 196 lb 9.6 oz (89.2 kg)   SpO2 94%   BMI 25.94 kg/m   Wt Readings from Last 3 Encounters:  11/19/18 197 lb 4 oz (89.5 kg)  11/19/18 196 lb 9.6 oz (89.2 kg)  11/10/18 205 lb 7.5 oz (93.2 kg)    Physical Exam  Constitutional: He appears well-developed and well-nourished. No distress.  HENT:  Head: Normocephalic and atraumatic.  Eyes: EOM are normal. No scleral icterus.  Neck: No thyromegaly present.  Cardiovascular: Normal rate and regular rhythm.  Pulmonary/Chest: Effort normal. He has decreased breath sounds in the left middle field and the left lower field. He has no wheezes.  Abdominal: Soft. Bowel sounds are normal. He exhibits no distension.  Musculoskeletal: He exhibits edema (1+ pitting edema in both distal legs).  Neurological: Coordination normal.  Skin: Skin is warm and dry. No pallor.  Psychiatric: He has a normal mood and affect. His behavior is normal. Judgment and thought content normal.    Results for orders placed  or performed during the hospital encounter of 11/10/18  Magnesium  Result Value Ref Range   Magnesium 2.0 1.7 - 2.4 mg/dL  CBC  Result Value Ref Range   WBC 7.7 4.0 - 10.5 K/uL   RBC 2.73 (L) 4.22 - 5.81 MIL/uL   Hemoglobin 7.8 (L) 13.0 - 17.0 g/dL   HCT 25.6 (L) 39.0 - 52.0 %   MCV 93.8 80.0 - 100.0 fL   MCH 28.6 26.0 - 34.0 pg   MCHC 30.5 30.0 - 36.0 g/dL   RDW 14.8 11.5 - 15.5 %   Platelets 381 150 - 400 K/uL   nRBC 0.0 0.0 - 0.2 %  Comprehensive metabolic panel  Result Value Ref Range   Sodium 135 135 - 145 mmol/L   Potassium 3.6 3.5 - 5.1 mmol/L   Chloride 99 98 - 111 mmol/L   CO2 27 22 - 32 mmol/L   Glucose, Bld 99 70 - 99 mg/dL   BUN 17 8 - 23 mg/dL   Creatinine, Ser 1.18 0.61 - 1.24 mg/dL   Calcium 7.9 (L) 8.9 - 10.3 mg/dL   Total Protein 5.2 (L) 6.5 - 8.1 g/dL   Albumin 2.4 (L) 3.5 - 5.0 g/dL   AST 32 15 - 41 U/L   ALT 28 0 - 44 U/L   Alkaline Phosphatase 43 38 - 126 U/L   Total Bilirubin 2.3 (H) 0.3 - 1.2 mg/dL   GFR calc non Af Amer 58 (L) >60 mL/min   GFR calc Af Amer >60 >60 mL/min   Anion gap 9 5 - 15  Magnesium  Result Value Ref Range   Magnesium 2.2 1.7 - 2.4 mg/dL  Procalcitonin - Baseline  Result Value Ref Range   Procalcitonin <0.10 ng/mL  TSH  Result Value Ref Range   TSH 3.722 0.350 - 4.500 uIU/mL  CBC  Result Value Ref Range   WBC 8.1 4.0 - 10.5 K/uL   RBC 2.92 (L) 4.22 - 5.81 MIL/uL   Hemoglobin 8.5 (L) 13.0 - 17.0 g/dL   HCT 27.2 (L) 39.0 - 52.0 %   MCV 93.2 80.0 - 100.0 fL   MCH 29.1 26.0 - 34.0 pg   MCHC 31.3 30.0 - 36.0 g/dL   RDW 14.6 11.5 - 15.5 %   Platelets 392 150 - 400 K/uL   nRBC 0.0 0.0 - 0.2 %  CBC  Result Value Ref Range   WBC 7.5 4.0 - 10.5 K/uL   RBC 2.84 (L) 4.22 - 5.81 MIL/uL   Hemoglobin 8.1 (L) 13.0 - 17.0 g/dL   HCT 26.1 (L) 39.0 - 52.0 %   MCV 91.9 80.0 - 100.0 fL   MCH 28.5 26.0 - 34.0 pg   MCHC 31.0 30.0 - 36.0 g/dL   RDW 14.5 11.5 - 15.5 %   Platelets 373 150 - 400 K/uL   nRBC 0.0 0.0 - 0.2 %  Basic  metabolic panel  Result Value Ref Range   Sodium 134 (L) 135 - 145 mmol/L   Potassium 3.4 (L) 3.5 - 5.1 mmol/L   Chloride 99 98 - 111 mmol/L   CO2 26 22 - 32 mmol/L   Glucose, Bld 145 (H) 70 - 99 mg/dL   BUN 12 8 - 23 mg/dL   Creatinine, Ser 0.99 0.61 - 1.24 mg/dL   Calcium 7.8 (L) 8.9 - 10.3 mg/dL   GFR calc non Af Amer >60 >60 mL/min   GFR calc Af Amer >60 >60 mL/min   Anion gap 9 5 - 15  ECHOCARDIOGRAM COMPLETE  Result Value Ref Range   Weight 3,287.5 oz   Height 73 in   BP 95/56 mmHg      Assessment & Plan:   Problem List Items Addressed This Visit      Cardiovascular and Mediastinum   Acute CHF (congestive heart failure) (HCC)    With leg edema; encouraged ace bandage wraps from feet upwards, remove and rewrap periodically; this is if he cannot tolerate compression stockings; avoid salt; seeing cardiologist later today        Other   Splenic laceration, subsequent encounter    Appreciate the expertise of the trauma team at [facility]; patient already underwent embolic coiling; change in imaging on CT was thought to be expected after procedure and did not reflect new bleeding       Other Visit Diagnoses    Cough    -  Primary   reviewed CXR from hospital; pneumonia possibility; will start doxy; sending for CXR today along w/left lat decub view; call or to ER if worse   Relevant Orders   DG Chest 2 View (Completed)   DG Chest Left Decubitus (Completed)   Pleural effusion       present in the hospital; pt denies thoracentesis; will repeat CXR and get left lat decub views; may need drainage   Relevant Orders   DG Chest 2 View (Completed)   DG Chest Left Decubitus (Completed)       Follow up plan: Return in about 2 weeks (around 12/03/2018) for follow-up visit with Dr. Sanda Klein.  An after-visit summary was printed and given to the patient at [place].  Please see the patient instructions which may  contain other information and recommendations beyond what is mentioned  above in the assessment and plan.  Meds ordered this encounter  Medications  . doxycycline (VIBRA-TABS) 100 MG tablet    Sig: Take 1 tablet (100 mg total) by mouth 2 (two) times daily.    Dispense:  14 tablet    Refill:  0    Orders Placed This Encounter  Procedures  . DG Chest 2 View  . DG Chest Left Decubitus

## 2018-11-19 NOTE — Telephone Encounter (Signed)
I spoke with PA, Obie Dredge Effusion looks stable, maybe a little better I explained he looks puny, hypotensive, sats lower than expected, persistent cough, weak She will contact IR and they will set up thoracentesis as an outpatient if trauma surgeon agrees She will look at films, with trauma surgeon, will contact patient

## 2018-11-19 NOTE — Assessment & Plan Note (Signed)
With leg edema; encouraged ace bandage wraps from feet upwards, remove and rewrap periodically; this is if he cannot tolerate compression stockings; avoid salt; seeing cardiologist later today

## 2018-11-19 NOTE — Telephone Encounter (Signed)
Left sided pleural effusion today That was noted on the chest xray from his hospital stay I called cardiologist first, discussed case to see if thoracentesis suggested by them I also had staff contact trauma surgeon, Dr. Johnathan Hausen

## 2018-11-19 NOTE — Patient Instructions (Signed)
Medication Instructions:  Your physician has recommended you make the following change in your medication:  1. DECREASE Metoprolol tartrate to 25 mg twice a day  If you need a refill on your cardiac medications before your next appointment, please call your pharmacy.   Lab work: Labs done today CMET and CBC If you have labs (blood work) drawn today and your tests are completely normal, you will receive your results only by: [name] MyChart Message (if you have MyChart) OR . A paper copy in the mail If you have any lab test that is abnormal or we need to change your treatment, we will call you to review the results.  Testing/Procedures: Your physician has recommended that you wear an event monitor. Event monitors are medical devices that record the heart's electrical activity. Doctors most often [place] these monitors to diagnose arrhythmias. Arrhythmias are problems with the speed or rhythm of the heartbeat. The monitor is a small, portable device. You can wear one while you do your normal daily activities. This is usually used to diagnose what is causing palpitations/syncope (passing out).    Follow-Up: At [facility], you and your health needs are our priority.  As part of our continuing mission to provide you with exceptional heart care, we have created designated Provider Care Teams.  These Care Teams include your primary Cardiologist (physician) and Advanced Practice Providers (APPs -  Physician Assistants and Nurse Practitioners) who all work together to provide you with the care you need, when you need it. You will need a follow up appointment in 1 months.  Please call our office 2 months in advance to schedule this appointment.  You may see [name], MD or one of the following Advanced Practice Providers on your designated Care Team:   Murray Hodgkins, NP Christell Faith, PA-C . Marrianne Mood, PA-C  Any Other Special Instructions Will Be Listed Below (If Applicable).

## 2018-11-19 NOTE — Patient Instructions (Addendum)
Try to wrap both legs from the feet up with ACE bandages and then elevate the legs Unwrap and rewrap every few hours and walk around, avoid getting a blood clot, don't sit too long, and do some calf exercises Go across the street to have the xray done Start the antibiotics We'll see if we need to have someone drain that fluid in the lung Avoid salt as much as possible See the heart doctor today

## 2018-11-20 LAB — CBC WITH DIFFERENTIAL/PLATELET
BASOS: 1 %
Basophils Absolute: 0 10*3/uL (ref 0.0–0.2)
EOS (ABSOLUTE): 0.3 10*3/uL (ref 0.0–0.4)
EOS: 4 %
HEMATOCRIT: 29.4 % — AB (ref 37.5–51.0)
HEMOGLOBIN: 9.6 g/dL — AB (ref 13.0–17.7)
IMMATURE GRANS (ABS): 0 10*3/uL (ref 0.0–0.1)
IMMATURE GRANULOCYTES: 0 %
LYMPHS: 18 %
Lymphocytes Absolute: 1.3 10*3/uL (ref 0.7–3.1)
MCH: 28.1 pg (ref 26.6–33.0)
MCHC: 32.7 g/dL (ref 31.5–35.7)
MCV: 86 fL (ref 79–97)
MONOCYTES: 7 %
Monocytes Absolute: 0.5 10*3/uL (ref 0.1–0.9)
NEUTROS ABS: 5.1 10*3/uL (ref 1.4–7.0)
Neutrophils: 70 %
Platelets: 449 10*3/uL (ref 150–450)
RBC: 3.42 x10E6/uL — ABNORMAL LOW (ref 4.14–5.80)
RDW: 13.6 % (ref 12.3–15.4)
WBC: 7.4 10*3/uL (ref 3.4–10.8)

## 2018-11-20 LAB — COMPREHENSIVE METABOLIC PANEL
ALBUMIN: 3.4 g/dL — AB (ref 3.5–4.7)
ALT: 31 IU/L (ref 0–44)
AST: 43 IU/L — ABNORMAL HIGH (ref 0–40)
Albumin/Globulin Ratio: 1 — ABNORMAL LOW (ref 1.2–2.2)
Alkaline Phosphatase: 53 IU/L (ref 39–117)
BUN/Creatinine Ratio: 14 (ref 10–24)
BUN: 16 mg/dL (ref 8–27)
Bilirubin Total: 1.9 mg/dL — ABNORMAL HIGH (ref 0.0–1.2)
CALCIUM: 8.4 mg/dL — AB (ref 8.6–10.2)
CO2: 27 mmol/L (ref 20–29)
CREATININE: 1.11 mg/dL (ref 0.76–1.27)
Chloride: 92 mmol/L — ABNORMAL LOW (ref 96–106)
GFR, EST AFRICAN AMERICAN: 72 mL/min/{1.73_m2} (ref >59)
GFR, EST NON AFRICAN AMERICAN: 62 mL/min/{1.73_m2} (ref >59)
Globulin, Total: 3.5 g/dL (ref 1.5–4.5)
Glucose: 123 mg/dL — ABNORMAL HIGH (ref 65–99)
Potassium: 3.8 mmol/L (ref 3.5–5.2)
Sodium: 135 mmol/L (ref 134–144)
Total Protein: 6.9 g/dL (ref 6.0–8.5)

## 2018-11-23 ENCOUNTER — Ambulatory Visit (HOSPITAL_COMMUNITY)
Admission: RE | Admit: 2018-11-23 | Discharge: 2018-11-23 | Disposition: A | Discharge status: Home / Self Care | Payer: Medicare HMO | Source: Ambulatory Visit | Attending: Physician Assistant | Admitting: Physician Assistant

## 2018-11-23 ENCOUNTER — Other Ambulatory Visit (HOSPITAL_COMMUNITY): Payer: Self-pay | Admitting: Physician Assistant

## 2018-11-23 ENCOUNTER — Encounter (HOSPITAL_COMMUNITY): Payer: Self-pay | Admitting: Physician Assistant

## 2018-11-23 ENCOUNTER — Ambulatory Visit (HOSPITAL_COMMUNITY)
Admission: RE | Admit: 2018-11-23 | Discharge: 2018-11-23 | Disposition: A | Discharge status: Home / Self Care | Payer: Medicare HMO | Source: Ambulatory Visit | Attending: General Surgery | Admitting: General Surgery

## 2018-11-23 DIAGNOSIS — J90 Pleural effusion, not elsewhere classified: Secondary | ICD-10-CM | POA: Insufficient documentation

## 2018-11-23 DIAGNOSIS — Z9889 Other specified postprocedural states: Secondary | ICD-10-CM

## 2018-11-23 HISTORY — PX: IR THORACENTESIS ASP PLEURAL SPACE W/IMG GUIDE: IMG5380

## 2018-11-23 MED ORDER — LIDOCAINE HCL 1 % IJ SOLN
INTRAMUSCULAR | Status: AC
  Filled 2018-11-23: qty 20

## 2018-11-23 MED ORDER — LIDOCAINE HCL 1 % IJ SOLN
INTRAMUSCULAR | Status: DC | PRN
  Administered 2018-11-23: 10 mL

## 2018-11-23 NOTE — Procedures (Signed)
PROCEDURE SUMMARY:  Successful image-guided left thoracentesis. Yielded 900 milliliters of dark brown fluid. Patient tolerated procedure well. No immediate complications.  Specimen was not sent for labs. CXR ordered.  Please see imaging section of Epic for full dictation.  Joaquim Nam PA-C 11/23/2018 3:10 PM

## 2018-11-27 ENCOUNTER — Telehealth: Payer: Self-pay | Admitting: Cardiovascular Disease

## 2018-11-27 DIAGNOSIS — C4492 Squamous cell carcinoma of skin, unspecified: Secondary | ICD-10-CM

## 2018-11-27 DIAGNOSIS — C44329 Squamous cell carcinoma of skin of other parts of face: Secondary | ICD-10-CM | POA: Diagnosis not present

## 2018-11-27 DIAGNOSIS — C4442 Squamous cell carcinoma of skin of scalp and neck: Secondary | ICD-10-CM | POA: Diagnosis not present

## 2018-11-27 DIAGNOSIS — L578 Other skin changes due to chronic exposure to nonionizing radiation: Secondary | ICD-10-CM | POA: Diagnosis not present

## 2018-11-27 HISTORY — DX: Squamous cell carcinoma of skin, unspecified: C44.92

## 2018-11-27 NOTE — Telephone Encounter (Signed)
I spoke with the patient. He wanted to confirm the date to remove his Zio monitor. This was placed on 11/19/18- the patient is aware to remove on 12/03/18. We have discussed how to remove the device and return it and the patient voices understanding.

## 2018-11-27 NOTE — Telephone Encounter (Signed)
Patient calling Patient is wearing a monitor and has has some questions he wants to confirm with nurse before taking it off  Please call to discuss

## 2018-12-03 ENCOUNTER — Ambulatory Visit
Admission: RE | Admit: 2018-12-03 | Discharge: 2018-12-03 | Disposition: A | Discharge status: Home / Self Care | Payer: Medicare HMO | Source: Ambulatory Visit | Attending: Family Medicine | Admitting: Family Medicine

## 2018-12-03 ENCOUNTER — Ambulatory Visit
Admission: RE | Admit: 2018-12-03 | Discharge: 2018-12-03 | Disposition: A | Discharge status: Home / Self Care | Payer: Medicare HMO | Attending: Family Medicine | Admitting: Family Medicine

## 2018-12-03 ENCOUNTER — Encounter: Payer: Self-pay | Admitting: Family Medicine

## 2018-12-03 ENCOUNTER — Ambulatory Visit (INDEPENDENT_AMBULATORY_CARE_PROVIDER_SITE_OTHER): Payer: Medicare HMO | Admitting: Family Medicine

## 2018-12-03 VITALS — BP 96/58 | HR 77 | Temp 98.1°F | Ht 73.0 in | Wt 187.5 lb

## 2018-12-03 DIAGNOSIS — R05 Cough: Secondary | ICD-10-CM | POA: Diagnosis not present

## 2018-12-03 DIAGNOSIS — J90 Pleural effusion, not elsewhere classified: Secondary | ICD-10-CM

## 2018-12-03 DIAGNOSIS — Z87891 Personal history of nicotine dependence: Secondary | ICD-10-CM | POA: Insufficient documentation

## 2018-12-03 DIAGNOSIS — Z85828 Personal history of other malignant neoplasm of skin: Secondary | ICD-10-CM

## 2018-12-03 DIAGNOSIS — D649 Anemia, unspecified: Secondary | ICD-10-CM | POA: Diagnosis not present

## 2018-12-03 DIAGNOSIS — R739 Hyperglycemia, unspecified: Secondary | ICD-10-CM | POA: Diagnosis not present

## 2018-12-03 DIAGNOSIS — Z125 Encounter for screening for malignant neoplasm of prostate: Secondary | ICD-10-CM

## 2018-12-03 DIAGNOSIS — R634 Abnormal weight loss: Secondary | ICD-10-CM

## 2018-12-03 DIAGNOSIS — L89152 Pressure ulcer of sacral region, stage 2: Secondary | ICD-10-CM

## 2018-12-03 MED ORDER — ZINC GLUCONATE 50 MG PO TABS: 50.0000 mg | ORAL_TABLET | Freq: Every day | ORAL | 0 refills | Status: DC

## 2018-12-03 MED ORDER — NOVA CUSHION GEL SEAT PAD MISC: 1.0000 | Freq: Every day | 0 refills | Status: DC

## 2018-12-03 MED ORDER — VITAMIN C 500 MG PO TABS: 500.0000 mg | ORAL_TABLET | Freq: Every day | ORAL | 0 refills | Status: DC

## 2018-12-03 NOTE — Patient Instructions (Addendum)
Let's get labs today Please do hydrate well Get up slowly after sitting for a while Please have the xray done across the street today Call if you feel worse or anything new develops Keep your follow-up with the cardiologist Do please call the trauma center and schedule follow-up there We'll have you start zinc and vitamin C daily for the next two weeks We'll have you use the gel pad to help redistribute weight from that area with the skin breakdown Do get up and move several times every hour We'll have you see the wound center about your ulcers
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
## 2018-12-04 LAB — CBC WITH DIFFERENTIAL/PLATELET
Absolute Monocytes: 433 cells/uL (ref 200–950)
Basophils Absolute: 61 cells/uL (ref 0–200)
Basophils Relative: 1 %
Eosinophils Absolute: 201 cells/uL (ref 15–500)
Eosinophils Relative: 3.3 %
HCT: 31.6 % — ABNORMAL LOW (ref 38.5–50.0)
Hemoglobin: 10.2 g/dL — ABNORMAL LOW (ref 13.2–17.1)
Lymphs Abs: 1635 cells/uL (ref 850–3900)
MCH: 28.1 pg (ref 27.0–33.0)
MCHC: 32.3 g/dL (ref 32.0–36.0)
MCV: 87.1 fL (ref 80.0–100.0)
MPV: 10.6 fL (ref 7.5–12.5)
Monocytes Relative: 7.1 %
Neutro Abs: 3770 cells/uL (ref 1500–7800)
Neutrophils Relative %: 61.8 %
Platelets: 341 10*3/uL (ref 140–400)
RBC: 3.63 10*6/uL — ABNORMAL LOW (ref 4.20–5.80)
RDW: 14.4 % (ref 11.0–15.0)
Total Lymphocyte: 26.8 %
WBC: 6.1 10*3/uL (ref 3.8–10.8)

## 2018-12-04 LAB — COMPLETE METABOLIC PANEL WITH GFR
AG Ratio: 0.9 (calc) — ABNORMAL LOW (ref 1.0–2.5)
ALT: 8 U/L — ABNORMAL LOW (ref 9–46)
AST: 17 U/L (ref 10–35)
Albumin: 3.1 g/dL — ABNORMAL LOW (ref 3.6–5.1)
Alkaline phosphatase (APISO): 55 U/L (ref 40–115)
BUN: 16 mg/dL (ref 7–25)
CHLORIDE: 99 mmol/L (ref 98–110)
CO2: 33 mmol/L — ABNORMAL HIGH (ref 20–32)
Calcium: 8.1 mg/dL — ABNORMAL LOW (ref 8.6–10.3)
Creat: 0.95 mg/dL (ref 0.70–1.11)
GFR, EST AFRICAN AMERICAN: 87 mL/min/{1.73_m2} (ref >=60)
GFR, Est Non African American: 75 mL/min/{1.73_m2} (ref >=60)
Globulin: 3.6 g/dL (calc) (ref 1.9–3.7)
Glucose, Bld: 103 mg/dL — ABNORMAL HIGH (ref 65–99)
Potassium: 3.7 mmol/L (ref 3.5–5.3)
Sodium: 139 mmol/L (ref 135–146)
Total Bilirubin: 1.3 mg/dL — ABNORMAL HIGH (ref 0.2–1.2)
Total Protein: 6.7 g/dL (ref 6.1–8.1)

## 2018-12-04 LAB — HEMOGLOBIN A1C
Hgb A1c MFr Bld: 5.4 % of total Hgb (ref <5.7)
Mean Plasma Glucose: 108 (calc)
eAG (mmol/L): 6 (calc)

## 2018-12-04 LAB — PSA: PSA: 0.6 ng/mL (ref <=4.0)

## 2018-12-04 LAB — T3, FREE: T3, Free: 2.9 pg/mL (ref 2.3–4.2)

## 2018-12-04 LAB — T4, FREE: Free T4: 1.3 ng/dL (ref 0.8–1.8)

## 2018-12-08 DIAGNOSIS — I4891 Unspecified atrial fibrillation: Secondary | ICD-10-CM | POA: Diagnosis not present

## 2018-12-10 DIAGNOSIS — D735 Infarction of spleen: Secondary | ICD-10-CM | POA: Diagnosis not present

## 2018-12-15 ENCOUNTER — Telehealth: Payer: Self-pay | Admitting: *Deleted

## 2018-12-15 NOTE — Telephone Encounter (Signed)
Patient made aware of results and verbalized understanding.  Appointment made for 1/3/with Christell Faith, PA

## 2018-12-15 NOTE — Telephone Encounter (Signed)
-----   Message from Rise Mu, PA-C sent at 12/14/2018  3:31 PM EST ----- Monitor was discussed with Dr. Saunders Revel. Monitor showed predominant rhythm of sinus with an average heart rate of 75 bpm with a range of 48 to 202 bpm.  There were frequent irregularly irregular rhythms noted with several strips being suspicious for A. fib with a possible A. fib burden of approximately 30%.  The longest episode lasted 40 minutes and 45 seconds.  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  No sustained ventricular arrhythmias or prolonged pauses were noted.  Patient was noted to have PACs and PVCs.  Given the patient's comorbid conditions, it is uncertain if he would be an ideal anticoagulation candidate at this time.  His blood pressure was on the soft side when I saw him which would preclude titration of his metoprolol.  After discussing with the reading physician, we may need to initiate amiodarone.  Please see if the patient is able to come see me later this week to discuss the above.

## 2018-12-15 NOTE — Telephone Encounter (Signed)
Attempted to reach the patient. Line was busy 
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
## 2018-12-18 ENCOUNTER — Encounter: Payer: Self-pay | Admitting: Physician Assistant

## 2018-12-18 ENCOUNTER — Ambulatory Visit: Payer: Medicare HMO | Admitting: Physician Assistant

## 2018-12-18 VITALS — BP 100/60 | HR 59 | Ht 73.0 in | Wt 193.0 lb

## 2018-12-18 DIAGNOSIS — I480 Paroxysmal atrial fibrillation: Secondary | ICD-10-CM | POA: Diagnosis not present

## 2018-12-18 DIAGNOSIS — I428 Other cardiomyopathies: Secondary | ICD-10-CM

## 2018-12-18 DIAGNOSIS — E8809 Other disorders of plasma-protein metabolism, not elsewhere classified: Secondary | ICD-10-CM

## 2018-12-18 DIAGNOSIS — I5022 Chronic systolic (congestive) heart failure: Secondary | ICD-10-CM | POA: Diagnosis not present

## 2018-12-18 DIAGNOSIS — I872 Venous insufficiency (chronic) (peripheral): Secondary | ICD-10-CM | POA: Diagnosis not present

## 2018-12-18 DIAGNOSIS — I498 Other specified cardiac arrhythmias: Secondary | ICD-10-CM | POA: Diagnosis not present

## 2018-12-18 DIAGNOSIS — D649 Anemia, unspecified: Secondary | ICD-10-CM | POA: Diagnosis not present

## 2018-12-18 DIAGNOSIS — I2510 Atherosclerotic heart disease of native coronary artery without angina pectoris: Secondary | ICD-10-CM

## 2018-12-18 DIAGNOSIS — I471 Supraventricular tachycardia: Secondary | ICD-10-CM | POA: Diagnosis not present

## 2018-12-18 DIAGNOSIS — I712 Thoracic aortic aneurysm, without rupture, unspecified: Secondary | ICD-10-CM

## 2018-12-18 MED ORDER — FUROSEMIDE 20 MG PO TABS: 20.0000 mg | ORAL_TABLET | Freq: Two times a day (BID) | ORAL | 0 refills | Status: DC

## 2018-12-18 MED ORDER — METOPROLOL TARTRATE 25 MG PO TABS: 12.5000 mg | ORAL_TABLET | Freq: Two times a day (BID) | ORAL | 3 refills | Status: DC

## 2018-12-18 NOTE — Patient Instructions (Signed)
Medication Instructions:  Your physician has recommended you make the following change in your medication:  1- DECREASE Lopressor to 0.5 tablet (12.5 mg) twice daily 2- INCREASE Lasix to 1 tablet (20 mg) twice daily  If you need a refill on your cardiac medications before your next appointment, please call your pharmacy.   Lab work: Your physician recommends that you return for lab work in: 1 week BMET  If you have labs (blood work) drawn today and your tests are completely normal, you will receive your results only by: [name] MyChart Message (if you have MyChart) OR . A paper copy in the mail If you have any lab test that is abnormal or we need to change your treatment, we will call you to review the results.  Testing/Procedures: 1- CTA Aorta   Thursday Jan 9th, 10:00 AM, arrive at 9:45, Outpatient imaging [address], [place], [place] [address] Liquids only 4 hours prior to testing. If you need to reschedule please call [phone].  Non-Cardiac CT Angiography (CTA), is a special type of CT scan that uses a computer to produce multi-dimensional views of major blood vessels throughout the body. In CT angiography, a contrast material is injected through an IV to help visualize the blood vessels   Follow-Up: At [facility], you and your health needs are our priority.  As part of our continuing mission to provide you with exceptional heart care, we have created designated Provider Care Teams.  These Care Teams include your primary Cardiologist (physician) and Advanced Practice Providers (APPs -  Physician Assistants and Nurse Practitioners) who all work together to provide you with the care you need, when you need it. You will need a follow up appointment in 3 months. You may see [name], MD or one of the following Advanced Practice Providers on your designated Care Team:   Murray Hodgkins, NP Christell Faith, PA-C . Marrianne Mood, PA-C  Any Other Special Instructions Will  Be Listed Below (If Applicable). A prescription has been provided for compression hose, please take to pharmacy.

## 2018-12-22 ENCOUNTER — Ambulatory Visit: Payer: Medicare HMO | Admitting: Physician Assistant

## 2018-12-23 ENCOUNTER — Ambulatory Visit
Admission: RE | Admit: 2018-12-23 | Discharge: 2018-12-23 | Disposition: A | Discharge status: Home / Self Care | Payer: Medicare HMO | Source: Ambulatory Visit | Attending: Internal Medicine | Admitting: Internal Medicine

## 2018-12-23 DIAGNOSIS — I77819 Aortic ectasia, unspecified site: Secondary | ICD-10-CM | POA: Insufficient documentation

## 2018-12-23 DIAGNOSIS — I712 Thoracic aortic aneurysm, without rupture: Secondary | ICD-10-CM | POA: Diagnosis not present

## 2018-12-23 MED ORDER — IOPAMIDOL (ISOVUE-370) INJECTION 76%
75.0000 mL | Freq: Once | INTRAVENOUS | Status: AC | PRN
  Administered 2018-12-23: 75 mL via INTRAVENOUS

## 2018-12-24 ENCOUNTER — Ambulatory Visit: Payer: Medicare HMO

## 2018-12-24 ENCOUNTER — Telehealth: Payer: Self-pay | Admitting: *Deleted

## 2018-12-24 NOTE — Telephone Encounter (Signed)
-----   Message from [name], MD sent at 12/23/2018 12:26 PM EST ----- Please let Anthony Mosley know that his a sending aortic aneurysm is unchanged in size at 4.4 cm.  He has a fluid/blood collection around the spleen, which is somewhat smaller compared with the abdominal CT in late November.  If he is not having any abdominal/flank pain, I do not recommend any further work-up at this time.  If he has been having pain, he should come to the ED for further evaluation.  He also has a small amount of fluid around the left lung (pleural effusion).  He should continue taking furosemide 20 mg twice daily.  If his shortness of breath and or swelling worsen, he should let us know right away.

## 2018-12-24 NOTE — Telephone Encounter (Signed)
No answer. Phone rang several times with no answer or voicemail.

## 2018-12-25 ENCOUNTER — Other Ambulatory Visit
Admission: RE | Admit: 2018-12-25 | Discharge: 2018-12-25 | Disposition: A | Discharge status: Home / Self Care | Payer: Medicare HMO | Source: Ambulatory Visit | Attending: Physician Assistant | Admitting: Physician Assistant

## 2018-12-25 DIAGNOSIS — I5022 Chronic systolic (congestive) heart failure: Secondary | ICD-10-CM | POA: Diagnosis not present

## 2018-12-25 DIAGNOSIS — I2510 Atherosclerotic heart disease of native coronary artery without angina pectoris: Secondary | ICD-10-CM | POA: Insufficient documentation

## 2018-12-25 LAB — BASIC METABOLIC PANEL
Anion gap: 8 (ref 5–15)
BUN: 18 mg/dL (ref 8–23)
CO2: 29 mmol/L (ref 22–32)
Calcium: 8.5 mg/dL — ABNORMAL LOW (ref 8.9–10.3)
Chloride: 98 mmol/L (ref 98–111)
Creatinine, Ser: 1.08 mg/dL (ref 0.61–1.24)
GFR calc Af Amer: >60 mL/min (ref >60)
GFR calc non Af Amer: >60 mL/min (ref >60)
Glucose, Bld: 126 mg/dL — ABNORMAL HIGH (ref 70–99)
POTASSIUM: 4.1 mmol/L (ref 3.5–5.1)
Sodium: 135 mmol/L (ref 135–145)

## 2018-12-29 NOTE — Telephone Encounter (Signed)
Results called to pt. Pt verbalized understanding. He denies having any abdominal or flank pain at this time. He verbalized understanding of when to call us for increased shortness of breath or swelling. States he started eating better. Advised him to stay away from adding salt or eating processed meats and foods. He is aware.  Encouraged him to try compression stockings and he said he is waiting to get measured at the pharmacy because Dr End advised him to get them last time.  He will let us know if he develops any new or worsening symptoms.  He is taking furosemide 20 mg twice daily as well.

## 2019-01-04 ENCOUNTER — Ambulatory Visit (INDEPENDENT_AMBULATORY_CARE_PROVIDER_SITE_OTHER): Payer: Medicare HMO | Admitting: Family Medicine

## 2019-01-04 ENCOUNTER — Encounter: Payer: Self-pay | Admitting: Family Medicine

## 2019-01-04 VITALS — BP 92/58 | HR 58 | Temp 97.9°F | Ht 73.0 in | Wt 179.1 lb

## 2019-01-04 DIAGNOSIS — I5021 Acute systolic (congestive) heart failure: Secondary | ICD-10-CM

## 2019-01-04 DIAGNOSIS — J90 Pleural effusion, not elsewhere classified: Secondary | ICD-10-CM

## 2019-01-04 DIAGNOSIS — K661 Hemoperitoneum: Secondary | ICD-10-CM

## 2019-01-04 DIAGNOSIS — R1909 Other intra-abdominal and pelvic swelling, mass and lump: Secondary | ICD-10-CM

## 2019-01-04 DIAGNOSIS — R634 Abnormal weight loss: Secondary | ICD-10-CM

## 2019-01-04 DIAGNOSIS — I714 Abdominal aortic aneurysm, without rupture, unspecified: Secondary | ICD-10-CM | POA: Insufficient documentation

## 2019-01-04 DIAGNOSIS — Z87891 Personal history of nicotine dependence: Secondary | ICD-10-CM | POA: Diagnosis not present

## 2019-01-04 DIAGNOSIS — R1907 Generalized intra-abdominal and pelvic swelling, mass and lump: Secondary | ICD-10-CM | POA: Diagnosis not present

## 2019-01-04 NOTE — Assessment & Plan Note (Signed)
rescan the pelvis

## 2019-01-04 NOTE — Assessment & Plan Note (Signed)
Being fitted for stockings; avoiding salt; on diuretic per cardiologist

## 2019-01-04 NOTE — Patient Instructions (Signed)
We'll get a scan We'll have you see the CT surgeon across the hall Do try to eat well Let me know if you lose any more weight; weigh yourself daily
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
## 2019-01-05 LAB — URINALYSIS W MICROSCOPIC + REFLEX CULTURE
Bacteria, UA: NONE SEEN /HPF
Bilirubin Urine: NEGATIVE
Glucose, UA: NEGATIVE
Hgb urine dipstick: NEGATIVE
Hyaline Cast: NONE SEEN /LPF
KETONES UR: NEGATIVE
Leukocyte Esterase: NEGATIVE
Nitrites, Initial: NEGATIVE
Protein, ur: NEGATIVE
RBC / HPF: NONE SEEN /HPF (ref 0–2)
Specific Gravity, Urine: 1.008 (ref 1.001–1.03)
Squamous Epithelial / HPF: NONE SEEN /HPF (ref <=5)
WBC, UA: NONE SEEN /HPF (ref 0–5)
pH: <=5 (ref 5.0–8.0)

## 2019-01-05 LAB — NO CULTURE INDICATED

## 2019-01-14 ENCOUNTER — Ambulatory Visit
Admission: RE | Admit: 2019-01-14 | Discharge: 2019-01-14 | Disposition: A | Discharge status: Home / Self Care | Payer: Medicare HMO | Source: Ambulatory Visit | Attending: Family Medicine | Admitting: Family Medicine

## 2019-01-14 ENCOUNTER — Other Ambulatory Visit: Payer: Self-pay

## 2019-01-14 ENCOUNTER — Ambulatory Visit
Admission: RE | Admit: 2019-01-14 | Discharge: 2019-01-14 | Disposition: A | Discharge status: Home / Self Care | Payer: Medicare HMO | Source: Ambulatory Visit | Attending: Cardiothoracic Surgery | Admitting: Cardiothoracic Surgery

## 2019-01-14 ENCOUNTER — Telehealth: Payer: Self-pay | Admitting: Family Medicine

## 2019-01-14 DIAGNOSIS — J90 Pleural effusion, not elsewhere classified: Secondary | ICD-10-CM | POA: Insufficient documentation

## 2019-01-14 DIAGNOSIS — R1909 Other intra-abdominal and pelvic swelling, mass and lump: Secondary | ICD-10-CM | POA: Diagnosis not present

## 2019-01-14 DIAGNOSIS — K4090 Unilateral inguinal hernia, without obstruction or gangrene, not specified as recurrent: Secondary | ICD-10-CM | POA: Diagnosis not present

## 2019-01-14 DIAGNOSIS — R1907 Generalized intra-abdominal and pelvic swelling, mass and lump: Secondary | ICD-10-CM | POA: Diagnosis not present

## 2019-01-14 DIAGNOSIS — R634 Abnormal weight loss: Secondary | ICD-10-CM | POA: Insufficient documentation

## 2019-01-14 MED ORDER — IOPAMIDOL (ISOVUE-300) INJECTION 61%
85.0000 mL | Freq: Once | INTRAVENOUS | Status: AC | PRN
  Administered 2019-01-14: 85 mL via INTRAVENOUS

## 2019-01-14 NOTE — Telephone Encounter (Signed)
I spoke with patient about CT scan Inguinal hernia with loop of bowel, no obstructive changes Burning feeling when reaching up; otherwise, no pain Little tenderness with coughing Goes back down when he is supine Reasons to go emergently to the ER reviewed, s/s incarceration discussed Refer to general surgeon

## 2019-01-14 NOTE — Progress Notes (Signed)
Placed order for CXR [id number].

## 2019-01-15 ENCOUNTER — Other Ambulatory Visit: Payer: Self-pay

## 2019-01-15 ENCOUNTER — Encounter: Payer: Self-pay | Admitting: Cardiothoracic Surgery

## 2019-01-15 ENCOUNTER — Ambulatory Visit: Payer: Medicare HMO | Admitting: Cardiothoracic Surgery

## 2019-01-15 VITALS — BP 114/68 | HR 58 | Temp 97.7°F | Resp 18 | Ht 73.0 in | Wt 180.2 lb

## 2019-01-15 DIAGNOSIS — J90 Pleural effusion, not elsewhere classified: Secondary | ICD-10-CM | POA: Diagnosis not present
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
## 2019-01-15 NOTE — Patient Instructions (Addendum)
Please call our office if you have questions or concerns.   Please see your appointment with Dr.Piscoya listed below.

## 2019-01-20 ENCOUNTER — Other Ambulatory Visit: Payer: Self-pay

## 2019-01-20 ENCOUNTER — Encounter: Payer: Self-pay | Admitting: Surgery

## 2019-01-20 ENCOUNTER — Ambulatory Visit: Payer: Medicare HMO | Admitting: Surgery

## 2019-01-20 ENCOUNTER — Encounter: Payer: Self-pay | Admitting: *Deleted

## 2019-01-20 ENCOUNTER — Telehealth: Payer: Self-pay | Admitting: *Deleted

## 2019-01-20 VITALS — BP 107/58 | HR 49 | Temp 97.3°F | Resp 16 | Ht 73.0 in | Wt 179.0 lb

## 2019-01-20 DIAGNOSIS — K4090 Unilateral inguinal hernia, without obstruction or gangrene, not specified as recurrent: Secondary | ICD-10-CM | POA: Diagnosis not present

## 2019-01-20 NOTE — Progress Notes (Signed)
Patient's surgery to be scheduled for 02-04-19 at [facility] with Dr. Hampton Abbot.  The patient is aware he will need to Pre-Admit. Patient will check in at the [place], [address] (first floor). Patient will be contacted with date/time once [facility] arranges.   The patient is aware to call the office should he have further questions.   *A cardiac clearance request has been faxed to [facility] in [place]. Patient has seen Dr. Harrell Gave End in the past.

## 2019-01-20 NOTE — Telephone Encounter (Signed)
Patient contacted and surgery date confirmed- 02-04-19.  The patient has been scheduled to Pre-admit on 01-28-19 at 8 am. Patient states this time is not good. He was given the number to Pre-admission Testing to call and get this rescheduled when convenient-[phone].  *Cardiac clearance pending.

## 2019-01-20 NOTE — Patient Instructions (Addendum)
The patient is aware to call back for any questions or new concerns. Schedule outpatient right inguinal hernia repair  Inguinal Hernia, Adult An inguinal hernia is when fat or your intestines push through a weak spot in a muscle where your leg meets your lower belly (groin). This causes a rounded lump (bulge). This kind of hernia could also be:  In your scrotum, if you are male.  In folds of skin around your vagina, if you are male. There are three types of inguinal hernias. These include:  Hernias that can be pushed back into the belly (are reducible). This type rarely causes pain.  Hernias that cannot be pushed back into the belly (are incarcerated).  Hernias that cannot be pushed back into the belly and lose their blood supply (are strangulated). This type needs emergency surgery. If you do not have symptoms, you may not need treatment. If you have symptoms or a large hernia, you may need surgery. Follow these instructions at home: Lifestyle  Do these things if told by your doctor so you do not have trouble pooping (constipation): ? Drink enough fluid to keep your pee (urine) pale yellow. ? Eat foods that have a lot of fiber. These include fresh fruits and vegetables, whole grains, and beans. ? Limit foods that are high in fat and processed sugars. These include foods that are fried or sweet. ? Take medicine for trouble pooping.  Avoid lifting heavy objects.  Avoid standing for long amounts of time.  Do not use any products that contain nicotine or tobacco. These include cigarettes and e-cigarettes. If you need help quitting, ask your doctor.  Stay at a healthy weight. General instructions  You may try to push your hernia in by very gently pressing on it when you are lying down. Do not try to force the bulge back in if it will not push in easily.  Watch your hernia for any changes in shape, size, or color. Tell your doctor if you see any changes.  Take over-the-counter and  prescription medicines only as told by your doctor.  Keep all follow-up visits as told by your doctor. This is important. Contact a doctor if:  You have a fever.  You have new symptoms.  Your symptoms get worse. Get help right away if:  The area where your leg meets your lower belly has: ? Pain that gets worse suddenly. ? A bulge that gets bigger suddenly, and it does not get smaller after that. ? A bulge that turns red or purple. ? A bulge that is painful when you touch it.  You are a man, and your scrotum: ? Suddenly feels painful. ? Suddenly changes in size.  You cannot push the hernia in by very gently pressing on it when you are lying down. Do not try to force the bulge back in if it will not push in easily.  You feel sick to your stomach (nauseous), and that feeling does not go away.  You throw up (vomit), and that keeps happening.  You have a fast heartbeat.  You cannot poop (have a bowel movement) or pass gas. These symptoms may be an emergency. Do not wait to see if the symptoms will go away. Get medical help right away. Call your local emergency services (911 in the [place] Summary  An inguinal hernia is when fat or your intestines push through a weak spot in a muscle where your leg meets your lower belly (groin). This causes a rounded lump (bulge).  If you do not have symptoms, you may not need treatment. If you have symptoms or a large hernia, you may need surgery.  Avoid lifting heavy objects. Also avoid standing for long amounts of time.  Do not try to force the bulge back in if it will not push in easily. This information is not intended to replace advice given to you by your health care provider. Make sure you discuss any questions you have with your health care provider. Document Released: 01/02/2007 Document Revised: 01/03/2018 Document Reviewed: 09/03/2017 Elsevier Interactive Patient Education  2019 [employer].

## 2019-01-21 ENCOUNTER — Encounter: Payer: Self-pay | Admitting: Surgery

## 2019-01-21 ENCOUNTER — Telehealth: Payer: Self-pay | Admitting: Cardiovascular Disease

## 2019-01-21 DIAGNOSIS — K4090 Unilateral inguinal hernia, without obstruction or gangrene, not specified as recurrent: Secondary | ICD-10-CM | POA: Insufficient documentation

## 2019-01-21 NOTE — Telephone Encounter (Signed)
° °  [facility] Pre-operative Risk Assessment    Request for surgical clearance:  1. What type of surgery is being performed? R inguinal hernia repair   2. When is this surgery scheduled? 02/04/19   3. What type of clearance is required (medical clearance vs. Pharmacy clearance to hold med vs. Both)? cardiac  4. Are there any medications that need to be held prior to surgery and how long? no   5. Practice name and name of physician performing surgery? [facility], Dr Hampton Abbot   6. What is your office phone number [phone]    7.   What is your office fax number [phone]  8.   Anesthesia type (None, local, MAC, general) ? general   Ace Gins 01/21/2019, 11:11 AM  _________________________________________________________________   (provider comments below)
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
## 2019-01-21 NOTE — Telephone Encounter (Signed)
error 
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
## 2019-01-25 NOTE — Telephone Encounter (Signed)
Left message for patient to contact office. Needs appt for cardiac clearance.

## 2019-01-25 NOTE — Telephone Encounter (Signed)
   Primary Cardiologist:Christopher End, MD  Chart reviewed as part of pre-operative protocol coverage. Because of Anthony Mosley's past medical history and time since last visit, he/she will require a follow-up visit in order to better assess preoperative cardiovascular risk.  Pre-op covering staff: - Please schedule appointment and call patient to inform them. - Please contact requesting surgeon's office via preferred method (i.e, phone, fax) to inform them of need for appointment prior to surgery.  If applicable, this message will also be routed to pharmacy pool and/or primary cardiologist for input on holding anticoagulant/antiplatelet agent as requested below so that this information is available at time of patient's appointment.   Need appt prior to surgery as patient has new onset of chest pain for the past 2 weeks, occurs at rest, last about 5 min. 2-3 times a week.   [place], [place]  01/25/2019, 12:06 PM

## 2019-01-26 ENCOUNTER — Encounter: Payer: Self-pay | Admitting: Physician Assistant

## 2019-01-26 ENCOUNTER — Ambulatory Visit (INDEPENDENT_AMBULATORY_CARE_PROVIDER_SITE_OTHER): Payer: Medicare HMO | Admitting: Physician Assistant

## 2019-01-26 VITALS — BP 120/60 | HR 55 | Ht 73.0 in | Wt 185.8 lb

## 2019-01-26 DIAGNOSIS — I471 Supraventricular tachycardia: Secondary | ICD-10-CM | POA: Diagnosis not present

## 2019-01-26 DIAGNOSIS — I498 Other specified cardiac arrhythmias: Secondary | ICD-10-CM

## 2019-01-26 DIAGNOSIS — D649 Anemia, unspecified: Secondary | ICD-10-CM

## 2019-01-26 DIAGNOSIS — I2510 Atherosclerotic heart disease of native coronary artery without angina pectoris: Secondary | ICD-10-CM | POA: Diagnosis not present

## 2019-01-26 DIAGNOSIS — I712 Thoracic aortic aneurysm, without rupture, unspecified: Secondary | ICD-10-CM

## 2019-01-26 DIAGNOSIS — I428 Other cardiomyopathies: Secondary | ICD-10-CM | POA: Diagnosis not present

## 2019-01-26 DIAGNOSIS — I480 Paroxysmal atrial fibrillation: Secondary | ICD-10-CM

## 2019-01-26 DIAGNOSIS — J90 Pleural effusion, not elsewhere classified: Secondary | ICD-10-CM | POA: Diagnosis not present

## 2019-01-26 DIAGNOSIS — E8809 Other disorders of plasma-protein metabolism, not elsewhere classified: Secondary | ICD-10-CM

## 2019-01-26 DIAGNOSIS — I872 Venous insufficiency (chronic) (peripheral): Secondary | ICD-10-CM

## 2019-01-26 DIAGNOSIS — Z01810 Encounter for preprocedural cardiovascular examination: Secondary | ICD-10-CM | POA: Diagnosis not present

## 2019-01-26 DIAGNOSIS — I5022 Chronic systolic (congestive) heart failure: Secondary | ICD-10-CM | POA: Diagnosis not present

## 2019-01-26 DIAGNOSIS — I4719 Other supraventricular tachycardia: Secondary | ICD-10-CM

## 2019-01-26 NOTE — Patient Instructions (Signed)
Medication Instructions:  Your physician recommends that you continue on your current medications as directed. Please refer to the Current Medication list given to you today.  If you need a refill on your cardiac medications before your next appointment, please call your pharmacy.   Lab work: None ordered   If you have labs (blood work) drawn today and your tests are completely normal, you will receive your results only by: [name] MyChart Message (if you have MyChart) OR . A paper copy in the mail If you have any lab test that is abnormal or we need to change your treatment, we will call you to review the results.  Testing/Procedures: None ordered   Follow-Up: At [facility], you and your health needs are our priority.  As part of our continuing mission to provide you with exceptional heart care, we have created designated Provider Care Teams.  These Care Teams include your primary Cardiologist (physician) and Advanced Practice Providers (APPs -  Physician Assistants and Nurse Practitioners) who all work together to provide you with the care you need, when you need it. You will need a follow up appointment in 3 months. You may see [name], MD or Christell Faith, PA-C

## 2019-01-26 NOTE — Telephone Encounter (Signed)
Patient was seen by Christell Faith, PA-C on 01-26-2019 and notes were faxed to [facility]

## 2019-01-27 ENCOUNTER — Telehealth: Payer: Self-pay | Admitting: *Deleted

## 2019-01-27 NOTE — Telephone Encounter (Signed)
Patient contacted today and notified that we have received cardiac clearance from Christell Faith, PA-C at [facility] in [place]. See note in Epic from 01-26-19. A copy of clearance will be forwarded to the Pre-admission Testing Department.   The patient was reminded about Pre-admission Testing appointment scheduled for tomorrow-01-28-19 at 11:45 am. He verbalizes understanding.   Surgery as scheduled with Dr. Hampton Abbot for 02-04-19.

## 2019-01-28 ENCOUNTER — Other Ambulatory Visit: Payer: Self-pay

## 2019-01-28 ENCOUNTER — Telehealth: Payer: Self-pay | Admitting: Cardiovascular Disease

## 2019-01-28 ENCOUNTER — Encounter
Admission: RE | Admit: 2019-01-28 | Discharge: 2019-01-28 | Disposition: A | Discharge status: Home / Self Care | Payer: Medicare HMO | Source: Ambulatory Visit | Attending: Surgery | Admitting: Surgery

## 2019-01-28 ENCOUNTER — Other Ambulatory Visit: Payer: Medicare HMO

## 2019-01-28 DIAGNOSIS — Z01812 Encounter for preprocedural laboratory examination: Secondary | ICD-10-CM | POA: Diagnosis not present

## 2019-01-28 DIAGNOSIS — D649 Anemia, unspecified: Secondary | ICD-10-CM | POA: Insufficient documentation

## 2019-01-28 HISTORY — DX: Pneumonia, unspecified organism: J18.9

## 2019-01-28 HISTORY — DX: Pleural effusion, not elsewhere classified: J90

## 2019-01-28 LAB — CBC
HCT: 36.1 % — ABNORMAL LOW (ref 39.0–52.0)
HEMOGLOBIN: 11.4 g/dL — AB (ref 13.0–17.0)
MCH: 28.9 pg (ref 26.0–34.0)
MCHC: 31.6 g/dL (ref 30.0–36.0)
MCV: 91.4 fL (ref 80.0–100.0)
Platelets: 201 10*3/uL (ref 150–400)
RBC: 3.95 MIL/uL — ABNORMAL LOW (ref 4.22–5.81)
RDW: 15.2 % (ref 11.5–15.5)
WBC: 5.6 10*3/uL (ref 4.0–10.5)
nRBC: 0 % (ref 0.0–0.2)

## 2019-01-28 NOTE — Telephone Encounter (Signed)
Spoke with Langley Gauss at [facility] admitting and she wanted to know if patient should be taking lisinopril. She reports that he has been taking this and it is not part of his Epic medication list. Reviewed chart and it appears that medication was discontinued at discharge. She reports his blood pressure was Systolic in the 031'R and she will relay that this medication was discontinued. She had no further questions at this time.

## 2019-01-28 NOTE — Telephone Encounter (Signed)
Ok to hold lisinopril for now.

## 2019-01-28 NOTE — Patient Instructions (Signed)
Your procedure is scheduled on: Thursday, February 04, 2019 Report to Day Surgery on the 2nd floor of the [facility]. To find out your arrival time, please call [phone] between 1PM - 3PM on: Wednesday, February 03, 2019  REMEMBER: Instructions that are not followed completely may result in serious medical risk, up to and including death; or upon the discretion of your surgeon and anesthesiologist your surgery may need to be rescheduled.  Do not eat food after midnight the night before surgery.  No gum chewing, lozengers or hard candies.  You may however, drink CLEAR liquids up to 2 hours before you are scheduled to arrive for your surgery. Do not drink anything within 2 hours of the start of your surgery.  Clear liquids include: - water  - apple juice without pulp - gatorade - black coffee or tea (Do NOT add milk or creamers to the coffee or tea) Do NOT drink anything that is not on this list.  No Alcohol for 24 hours before or after surgery.  No Smoking including e-cigarettes for 24 hours prior to surgery.  No chewable tobacco products for at least 6 hours prior to surgery.  No nicotine patches on the day of surgery.  On the morning of surgery brush your teeth with toothpaste and water, you may rinse your mouth with mouthwash if you wish. Do not swallow any toothpaste or mouthwash.  Notify your doctor if there is any change in your medical condition (cold, fever, infection).  Do not wear jewelry, make-up, hairpins, clips or nail polish.  Do not wear lotions, powders, or perfumes.   Do not shave 48 hours prior to surgery.   Contacts and dentures may not be worn into surgery.  Do not bring valuables to the hospital, including drivers license, insurance or credit cards.  [facility] is not responsible for any belongings or valuables.   TAKE THESE MEDICATIONS THE MORNING OF SURGERY:  1.  METOPROLOL  Use CHG Soap as directed on instruction sheet.  NOW!  Stop  Anti-inflammatories (NSAIDS) such as Advil, Aleve, Ibuprofen, Motrin, Naproxen, Naprosyn and Aspirin based products such as Excedrin, Goodys Powder, BC Powder. (May take Tylenol or Acetaminophen if needed.)  NOW!  Stop ANY OVER THE COUNTER supplements until after surgery. (May continue Vitamin B.)  Wear comfortable clothing (specific to your surgery type) to the hospital.  If you are being discharged the day of surgery, you will not be allowed to drive home. You will need a responsible adult to drive you home and stay with you that night.   If you are taking public transportation, you will need to have a responsible adult with you. Please confirm with your physician that it is acceptable to use public transportation.   Please call [phone] if you have any questions about these instructions.

## 2019-01-28 NOTE — Telephone Encounter (Signed)
Pre admit [facility] calling to ask if patient is or is not taking lisonopril   Pt c/o medication issue:  1. Name of Medication: Lisinopril  2. How are you currently taking this medication (dosage and times per day)?   3. Are you having a reaction (difficulty breathing--STAT)? no  4. What is your medication issue? Per [facility] pre admit patient reports taking lisinopril but it is not on med list   Please call to clarify

## 2019-01-28 NOTE — Addendum Note (Signed)
Addended by: Anselm Pancoast on: 01/28/2019 01:34 PM   Modules accepted: Orders

## 2019-02-03 ENCOUNTER — Other Ambulatory Visit: Payer: Self-pay | Admitting: *Deleted

## 2019-02-03 MED ORDER — CEFAZOLIN SODIUM-DEXTROSE 2-4 GM/100ML-% IV SOLN
2.0000 g | INTRAVENOUS | Status: AC
  Administered 2019-02-04: 2 g via INTRAVENOUS

## 2019-02-03 NOTE — Patient Outreach (Addendum)
[facility]) Care Management  02/03/2019  SHAWNTE DEMAREST 1935-03-22 [id number]   Care coordination   [facility] RN Cm spoke with Mr Wuebker on 02/02/2019 while completing EMMI referral for his wife, Jeanett Schlein Mr Luczak inquired in conversation about resources for personal care services to assist with his wife after he has a hernia surgery on 02/04/2019 He discussed he had discussed it with a [facility] SW visiting his wife and had been encouraged to contact [facility] on 02/01/2019. He accidentally contacted the [facility] on 02/02/2019, CM found out after reviewing [facility]'s contact number with him on 02/02/2019. He was encouraged call [facility] on 02/02/2019 pm.  CM spoke with Mr Mcfarlane again today 02/03/2019 during the follow up call related to Mrs Basnett and was informed he did not call [facility] until 02/03/2019. He is pending a response CM had left a message for the [facility] SW on 02/02/2019. The [facility] SW and CM were able to speak in 02/03/2019 to confirm other options were offered to Mr Fish for personal care services plus this CM offered a list of personal care service agencies on 02/02/2019 He had informed CM that he had 2 non agency staff working with his wife already but had not inquired for extra assistance. He reports paying out of pocket for this staff. CM discussed on 02/02/2019 with Mr Schulenburg that there generally is a lack of personal care services at no cost. He voiced understanding  CM had also encouraged speaking with his family, friends and church members as a resource plus to notify his medical provider that he is the primary caregiver for his wife who is assist/dependent in care with dementia. He voiced understanding   He has also received resources in 09/2018 from a [place] elder care to assist with his wife's care needs. He confirms today he does not have a long term care policy for him nor his wife.   Note routed to Dr Lorenza Burton  L. Lavina Hamman, RN, BSN, [place] Coordinator Office number [phone] Mobile number [phone]  Main [facility] number [phone] Fax number [phone]

## 2019-02-04 ENCOUNTER — Ambulatory Visit: Payer: Medicare HMO | Admitting: Anesthesiology

## 2019-02-04 ENCOUNTER — Other Ambulatory Visit: Payer: Self-pay

## 2019-02-04 ENCOUNTER — Ambulatory Visit
Admission: RE | Admit: 2019-02-04 | Discharge: 2019-02-04 | Disposition: A | Discharge status: Home / Self Care | Payer: Medicare HMO | Attending: Surgery | Admitting: Surgery

## 2019-02-04 ENCOUNTER — Encounter
Admission: RE | Disposition: A | Discharge status: Home / Self Care | Payer: Self-pay | Source: Home / Self Care | Attending: Surgery

## 2019-02-04 DIAGNOSIS — I712 Thoracic aortic aneurysm, without rupture: Secondary | ICD-10-CM | POA: Diagnosis not present

## 2019-02-04 DIAGNOSIS — D176 Benign lipomatous neoplasm of spermatic cord: Secondary | ICD-10-CM | POA: Diagnosis not present

## 2019-02-04 DIAGNOSIS — Z888 Allergy status to other drugs, medicaments and biological substances status: Secondary | ICD-10-CM | POA: Diagnosis not present

## 2019-02-04 DIAGNOSIS — Z8249 Family history of ischemic heart disease and other diseases of the circulatory system: Secondary | ICD-10-CM | POA: Diagnosis not present

## 2019-02-04 DIAGNOSIS — Z79899 Other long term (current) drug therapy: Secondary | ICD-10-CM | POA: Insufficient documentation

## 2019-02-04 DIAGNOSIS — Z87891 Personal history of nicotine dependence: Secondary | ICD-10-CM | POA: Insufficient documentation

## 2019-02-04 DIAGNOSIS — K4090 Unilateral inguinal hernia, without obstruction or gangrene, not specified as recurrent: Secondary | ICD-10-CM | POA: Insufficient documentation

## 2019-02-04 DIAGNOSIS — I2510 Atherosclerotic heart disease of native coronary artery without angina pectoris: Secondary | ICD-10-CM | POA: Diagnosis not present

## 2019-02-04 DIAGNOSIS — K4020 Bilateral inguinal hernia, without obstruction or gangrene, not specified as recurrent: Secondary | ICD-10-CM | POA: Diagnosis not present

## 2019-02-04 DIAGNOSIS — E785 Hyperlipidemia, unspecified: Secondary | ICD-10-CM | POA: Diagnosis not present

## 2019-02-04 HISTORY — PX: INGUINAL HERNIA REPAIR: SHX194

## 2019-02-04 SURGERY — REPAIR, HERNIA, INGUINAL, ADULT
Anesthesia: General | Laterality: Right

## 2019-02-04 MED ORDER — CEFAZOLIN SODIUM-DEXTROSE 2-4 GM/100ML-% IV SOLN
INTRAVENOUS | Status: AC
  Filled 2019-02-04: qty 100

## 2019-02-04 MED ORDER — BUPIVACAINE LIPOSOME 1.3 % IJ SUSP: 20.0000 mL | Freq: Once | INTRAMUSCULAR | Status: DC

## 2019-02-04 MED ORDER — EPHEDRINE SULFATE 50 MG/ML IJ SOLN
INTRAMUSCULAR | Status: AC
  Filled 2019-02-04: qty 1

## 2019-02-04 MED ORDER — LIDOCAINE HCL (PF) 2 % IJ SOLN
INTRAMUSCULAR | Status: AC
  Filled 2019-02-04: qty 10

## 2019-02-04 MED ORDER — ROCURONIUM BROMIDE 50 MG/5ML IV SOLN
INTRAVENOUS | Status: AC
  Filled 2019-02-04: qty 2

## 2019-02-04 MED ORDER — BUPIVACAINE LIPOSOME 1.3 % IJ SUSP
INTRAMUSCULAR | Status: AC
  Filled 2019-02-04: qty 20

## 2019-02-04 MED ORDER — ROCURONIUM BROMIDE 100 MG/10ML IV SOLN
INTRAVENOUS | Status: DC | PRN
  Administered 2019-02-04: 10 mg via INTRAVENOUS
  Administered 2019-02-04: 40 mg via INTRAVENOUS
  Administered 2019-02-04: 20 mg via INTRAVENOUS

## 2019-02-04 MED ORDER — BUPIVACAINE HCL (PF) 0.25 % IJ SOLN
INTRAMUSCULAR | Status: AC
  Filled 2019-02-04: qty 30

## 2019-02-04 MED ORDER — ACETAMINOPHEN 500 MG PO TABS
1000.0000 mg | ORAL_TABLET | ORAL | Status: AC
  Administered 2019-02-04: 1000 mg via ORAL

## 2019-02-04 MED ORDER — DEXAMETHASONE SODIUM PHOSPHATE 4 MG/ML IJ SOLN
INTRAMUSCULAR | Status: AC
  Filled 2019-02-04: qty 1

## 2019-02-04 MED ORDER — GABAPENTIN 300 MG PO CAPS
300.0000 mg | ORAL_CAPSULE | ORAL | Status: AC
  Administered 2019-02-04: 300 mg via ORAL

## 2019-02-04 MED ORDER — PROPOFOL 10 MG/ML IV BOLUS
INTRAVENOUS | Status: AC
  Filled 2019-02-04: qty 20

## 2019-02-04 MED ORDER — GABAPENTIN 300 MG PO CAPS
ORAL_CAPSULE | ORAL | Status: AC
  Administered 2019-02-04: 300 mg via ORAL
  Filled 2019-02-04: qty 1

## 2019-02-04 MED ORDER — BUPIVACAINE-EPINEPHRINE (PF) 0.25% -1:200000 IJ SOLN
INTRAMUSCULAR | Status: DC | PRN
  Administered 2019-02-04: 30 mL

## 2019-02-04 MED ORDER — IBUPROFEN 600 MG PO TABS: 600.0000 mg | ORAL_TABLET | Freq: Three times a day (TID) | ORAL | 0 refills | Status: DC | PRN

## 2019-02-04 MED ORDER — LIDOCAINE HCL (CARDIAC) PF 100 MG/5ML IV SOSY
PREFILLED_SYRINGE | INTRAVENOUS | Status: DC | PRN
  Administered 2019-02-04: 80 mg via INTRAVENOUS

## 2019-02-04 MED ORDER — ACETAMINOPHEN 500 MG PO TABS
ORAL_TABLET | ORAL | Status: AC
  Administered 2019-02-04: 1000 mg via ORAL
  Filled 2019-02-04: qty 2

## 2019-02-04 MED ORDER — EPHEDRINE SULFATE 50 MG/ML IJ SOLN
INTRAMUSCULAR | Status: DC | PRN
  Administered 2019-02-04: 5 mg via INTRAVENOUS
  Administered 2019-02-04: 10 mg via INTRAVENOUS

## 2019-02-04 MED ORDER — BUPIVACAINE LIPOSOME 1.3 % IJ SUSP
INTRAMUSCULAR | Status: DC | PRN
  Administered 2019-02-04: 20 mL

## 2019-02-04 MED ORDER — LACTATED RINGERS IV SOLN
INTRAVENOUS | Status: DC
  Administered 2019-02-04: 08:00:00 via INTRAVENOUS

## 2019-02-04 MED ORDER — FENTANYL CITRATE (PF) 100 MCG/2ML IJ SOLN
INTRAMUSCULAR | Status: DC | PRN
  Administered 2019-02-04 (×2): 50 ug via INTRAVENOUS

## 2019-02-04 MED ORDER — FENTANYL CITRATE (PF) 100 MCG/2ML IJ SOLN: 25.0000 ug | INTRAMUSCULAR | Status: DC | PRN

## 2019-02-04 MED ORDER — SUGAMMADEX SODIUM 200 MG/2ML IV SOLN
INTRAVENOUS | Status: DC | PRN
  Administered 2019-02-04: 170 mg via INTRAVENOUS

## 2019-02-04 MED ORDER — OXYCODONE HCL 5 MG PO TABS: 5.0000 mg | ORAL_TABLET | ORAL | 0 refills | Status: DC | PRN

## 2019-02-04 MED ORDER — FENTANYL CITRATE (PF) 100 MCG/2ML IJ SOLN
INTRAMUSCULAR | Status: AC
  Filled 2019-02-04: qty 2

## 2019-02-04 MED ORDER — CHLORHEXIDINE GLUCONATE CLOTH 2 % EX PADS: 6.0000 | MEDICATED_PAD | Freq: Once | CUTANEOUS | Status: DC

## 2019-02-04 MED ORDER — FAMOTIDINE 20 MG PO TABS
ORAL_TABLET | ORAL | Status: AC
  Administered 2019-02-04: 20 mg via ORAL
  Filled 2019-02-04: qty 1

## 2019-02-04 MED ORDER — ONDANSETRON HCL 4 MG/2ML IJ SOLN: 4.0000 mg | Freq: Once | INTRAMUSCULAR | Status: DC | PRN

## 2019-02-04 MED ORDER — FAMOTIDINE 20 MG PO TABS
20.0000 mg | ORAL_TABLET | Freq: Once | ORAL | Status: AC
  Administered 2019-02-04: 20 mg via ORAL

## 2019-02-04 MED ORDER — ONDANSETRON HCL 4 MG/2ML IJ SOLN
INTRAMUSCULAR | Status: AC
  Filled 2019-02-04: qty 2

## 2019-02-04 MED ORDER — ONDANSETRON HCL 4 MG/2ML IJ SOLN
INTRAMUSCULAR | Status: DC | PRN
  Administered 2019-02-04: 4 mg via INTRAVENOUS

## 2019-02-04 MED ORDER — DEXAMETHASONE SODIUM PHOSPHATE 10 MG/ML IJ SOLN
INTRAMUSCULAR | Status: DC | PRN
  Administered 2019-02-04: 10 mg via INTRAVENOUS

## 2019-02-04 MED ORDER — SUGAMMADEX SODIUM 200 MG/2ML IV SOLN
INTRAVENOUS | Status: AC
  Filled 2019-02-04: qty 2

## 2019-02-04 MED ORDER — PROPOFOL 10 MG/ML IV BOLUS
INTRAVENOUS | Status: DC | PRN
  Administered 2019-02-04: 50 mg via INTRAVENOUS
  Administered 2019-02-04: 100 mg via INTRAVENOUS

## 2019-02-04 MED ORDER — BUPIVACAINE-EPINEPHRINE (PF) 0.25% -1:200000 IJ SOLN
INTRAMUSCULAR | Status: AC
  Filled 2019-02-04: qty 30

## 2019-02-04 SURGICAL SUPPLY — 38 items
ADH SKN CLS APL DERMABOND .7 (GAUZE/BANDAGES/DRESSINGS) ×1
BLADE SURG 15 STRL LF DISP TIS (BLADE) ×1 IMPLANT
BLADE SURG 15 STRL SS (BLADE) ×3
CANISTER SUCT 1200ML W/VALVE (MISCELLANEOUS) ×3 IMPLANT
CHLORAPREP W/TINT 26ML (MISCELLANEOUS) ×3 IMPLANT
COVER WAND RF STERILE (DRAPES) ×1 IMPLANT
DERMABOND ADVANCED (GAUZE/BANDAGES/DRESSINGS) ×2
DERMABOND ADVANCED .7 DNX12 (GAUZE/BANDAGES/DRESSINGS) ×1 IMPLANT
DRAIN PENROSE 1/4X12 LTX (DRAIN) ×3 IMPLANT
DRAPE LAPAROTOMY 100X77 ABD (DRAPES) ×3 IMPLANT
ELECT CAUTERY BLADE 6.4 (BLADE) ×3 IMPLANT
ELECT REM PT RETURN 9FT ADLT (ELECTROSURGICAL) ×3
ELECTRODE REM PT RTRN 9FT ADLT (ELECTROSURGICAL) ×1 IMPLANT
GLOVE SURG SYN 7.0 (GLOVE) ×3 IMPLANT
GLOVE SURG SYN 7.0 PF PI (GLOVE) ×1 IMPLANT
GLOVE SURG SYN 7.5  E (GLOVE) ×2
GLOVE SURG SYN 7.5 E (GLOVE) ×1 IMPLANT
GLOVE SURG SYN 7.5 PF PI (GLOVE) ×1 IMPLANT
GOWN STRL REUS W/ TWL LRG LVL3 (GOWN DISPOSABLE) ×2 IMPLANT
GOWN STRL REUS W/TWL LRG LVL3 (GOWN DISPOSABLE) ×6
LABEL OR SOLS (LABEL) ×1 IMPLANT
MESH MARLEX PLUG MEDIUM (Mesh General) ×3 IMPLANT
NEEDLE HYPO 22GX1.5 SAFETY (NEEDLE) ×3 IMPLANT
NS IRRIG 500ML POUR BTL (IV SOLUTION) ×3 IMPLANT
PACK BASIN MINOR ARMC (MISCELLANEOUS) ×3 IMPLANT
SPONGE LAP 18X18 RF (DISPOSABLE) ×3 IMPLANT
SUT MNCRL 4-0 (SUTURE) ×3
SUT MNCRL 4-0 27XMFL (SUTURE) ×1
SUT PROLENE 2 0 SH DA (SUTURE) ×6 IMPLANT
SUT SILK 2 0 (SUTURE) ×3
SUT SILK 2-0 30XBRD TIE 12 (SUTURE) IMPLANT
SUT VIC AB 2-0 CT1 (SUTURE) ×3 IMPLANT
SUT VIC AB 3-0 SH 27 (SUTURE) ×3
SUT VIC AB 3-0 SH 27X BRD (SUTURE) ×1 IMPLANT
SUTURE MNCRL 4-0 27XMF (SUTURE) ×1 IMPLANT
SYR 10ML LL (SYRINGE) ×3 IMPLANT
SYR 30ML LL (SYRINGE) ×3 IMPLANT
SYR BULB IRRIG 60ML STRL (SYRINGE) ×3 IMPLANT

## 2019-02-04 NOTE — Anesthesia Preprocedure Evaluation (Signed)
Anesthesia Evaluation  Patient identified by MRN, date of birth, ID band Patient awake    Reviewed: Allergy & Precautions, NPO status , Patient's Chart, lab work & pertinent test results  History of Anesthesia Complications (+) PONV and history of anesthetic complications  Airway Mallampati: II  TM Distance: >3 FB Neck ROM: Full    Dental  (+) Partial Upper   Pulmonary neg sleep apnea, neg COPD, former smoker,    breath sounds clear to auscultation- rhonchi (-) wheezing      Cardiovascular (-) hypertension+CHF (NICM)  (-) CAD, (-) Past MI, (-) Cardiac Stents and (-) CABG  Rhythm:Regular Rate:Normal - Systolic murmurs and - Diastolic murmurs Echo 52/77/82: - Left ventricle: The cavity size was normal. Systolic function was   mildly reduced. The estimated ejection fraction was in the range   of 45% to 50%. Regional wall motion abnormalities cannot be   excluded. The study is not technically sufficient to allow   evaluation of LV diastolic function. - Aortic valve: There was mild to moderate regurgitation. - Left atrium: The atrium was mildly dilated. - Right atrium: The atrium was mildly dilated. - Pulmonary arteries: PA peak pressure: 39 mm Hg (S).    Neuro/Psych neg Seizures negative neurological ROS  negative psych ROS   GI/Hepatic negative GI ROS, Neg liver ROS,   Endo/Other  negative endocrine ROSneg diabetes  Renal/GU Renal InsufficiencyRenal disease     Musculoskeletal  (+) Arthritis ,   Abdominal (+) - obese,   Peds  Hematology negative hematology ROS (+)   Anesthesia Other Findings Past Medical History: No date: Arthritis No date: [place] of lung (HCC)     Comment:  a. seen on CT. No date: CAD (coronary artery disease)     Comment:  a.  Elective R/LHC 06/12/18 showed mild-moderate               nonobstructive RCA CAD with mild LAD myocardial bridging               but no significant obstructive CAD;  there was mildly               elevated L heart filling pressure, moderately elevated R               heart filling pressure, and mild pulm HTN. No date: [facility])     Comment:  SKIN No date: Chronic knee pain No date: Cough No date: Edema     Comment:  FEET/LEGS No date: Former tobacco use No date: Hemorrhoids No date: Hyperbilirubinemia No date: Hyperlipidemia No date: Mild aortic insufficiency No date: Mild pulmonary hypertension ([place]) No date: Moderate mitral regurgitation No date: NICM (nonischemic cardiomyopathy) ([place])     Comment:  a. EF 40-45% by echo 05/2017. 11/2018: Pleural effusion, left No date: Pneumonia No date: PONV (postoperative nausea and vomiting)     Comment:  after first cataract No date: Rosacea No date: Ruptured spleen No date: Thoracic aortic aneurysm ([place])     Comment:  a. 4.4cm by CT 12/2017, imaged incompletely at 4.3cm in               10/2018 CT. No date: Wears dentures     Comment:  partial upper No date: Wheezing   Reproductive/Obstetrics                             Anesthesia Physical Anesthesia Plan  ASA: III  Anesthesia Plan:  General   Post-op Pain Management:    Induction: Intravenous  PONV Risk Score and Plan: 2 and Ondansetron and Dexamethasone  Airway Management Planned: Oral ETT  Additional Equipment:   Intra-op Plan:   Post-operative Plan: Extubation in OR  Informed Consent: I have reviewed the patients History and Physical, chart, labs and discussed the procedure including the risks, benefits and alternatives for the proposed anesthesia with the patient or authorized representative who has indicated his/her understanding and acceptance.     Dental advisory given  Plan Discussed with: CRNA and Anesthesiologist  Anesthesia Plan Comments:         Anesthesia Quick Evaluation

## 2019-02-04 NOTE — Anesthesia Procedure Notes (Signed)
Procedure Name: Intubation Date/Time: 02/04/2019 9:29 AM Performed by: Aline Brochure, CRNA Pre-anesthesia Checklist: Patient identified, Emergency Drugs available, Patient being monitored and Suction available Patient Re-evaluated:Patient Re-evaluated prior to induction Oxygen Delivery Method: Circle system utilized Preoxygenation: Pre-oxygenation with 100% oxygen Induction Type: IV induction Ventilation: Mask ventilation without difficulty Laryngoscope Size: Mac and 4 Grade View: Grade II Tube type: Oral Tube size: 7.5 mm Number of attempts: 1 Airway Equipment and Method: Stylet Placement Confirmation: ETT inserted through vocal cords under direct vision,  positive ETCO2 and breath sounds checked- equal and bilateral Secured at: 22 cm Tube secured with: Tape Dental Injury: Teeth and Oropharynx as per pre-operative assessment

## 2019-02-04 NOTE — Op Note (Signed)
  Procedure Date:  02/04/2019  Pre-operative Diagnosis:  Right inguinal hernia  Post-operative Diagnosis:  Right inguinal hernia  Procedure:  Right Inguinal Hernia Repair  Surgeon:  Melvyn Neth, MD  Anesthesia:  General endotracheal  Estimated Blood Loss:  5 ml  Specimens:  Hernia sac and cord lipoma  Complications:  None  Indications for Procedure:  This is a 84 y.o. male who presents with a right inguinal hernia.  The options of surgery versus observation were reviewed with the patient and/or family. The risks of bleeding, abscess or infection, recurrence of symptoms, potential for an open procedure, injury to surrounding structures, and chronic pain were all discussed with the patient and was willing to proceed.  Description of Procedure: The patient was correctly identified in the preoperative area and brought into the operating room.  The patient was placed supine with VTE prophylaxis in place.  Appropriate time-outs were performed.  Anesthesia was induced and the patient was intubated.  Appropriate antibiotics were infused.  The right groin and lower abdomen were prepped and draped in a sterile fashion. An oblique incision was made between the pubic symphysis extending laterally toward the ASIS. Using electrocautery, the subcutaneous tissues were dissected, assuring adequate hemostasis, until reaching the external oblique aponeurosis.  A 1 cm incision was made over the aponeurosis and extended laterally and medially toward the external inguinal ring avoiding injury to the ilioinguinal nerve.  The cord was encircled using a Penrose drain and using medial and lateral retraction, the cord structures were identified and the hernia sac was identified and dissected free.  The sac was entered and freed of any contents carefully and then ligated with 2-0 Silk tie and resected.  The stump was pushed back into the abdominal cavity.  A cord lipoma was also identified and ligated and  resected.  A Bard mesh was then placed and sutured to the pubic tubercle, shelving edge, and conjoined tendon with interrupted 2-0 Prolene sutures.  The tails of the mesh were crossed behind the cord and sutured together creating a new internal ring.  The external oblique was then closed in running fashion with 2-0 Vicryl, creating a new external ring.  The wound was irrigated and the incision was closed in two layers with interrupted 3-0 Vicryl and running 4-0 Monocryl.  The wound was cleaned and sealed with DermaBond.  The patient was emerged from anesthesia and extubated and brought to the recovery room for further management.  The patient tolerated the procedure well and all counts were correct at the end of the case.   Melvyn Neth, MD

## 2019-02-04 NOTE — Interval H&P Note (Signed)
History and Physical Interval Note:  02/04/2019 9:04 AM  Anthony Mosley  has presented today for surgery, with the diagnosis of RIGHT INGUINAL HERNIA  The various methods of treatment have been discussed with the patient and family. After consideration of risks, benefits and other options for treatment, the patient has consented to  Procedure(s): HERNIA REPAIR INGUINAL WITH MESH (Right) as a surgical intervention .  The patient's history has been reviewed, patient examined, no change in status, stable for surgery.  I have reviewed the patient's chart and labs.  Questions were answered to the patient's satisfaction.     [name]

## 2019-02-04 NOTE — Transfer of Care (Signed)
Immediate Anesthesia Transfer of Care Note  Patient: Anthony Mosley  Procedure(s) Performed: HERNIA REPAIR INGUINAL WITH MESH (Right )  Patient Location: PACU  Anesthesia Type:General  Level of Consciousness: sedated  Airway & Oxygen Therapy: Patient connected to face mask oxygen  Post-op Assessment: Post -op Vital signs reviewed and stable  Post vital signs: stable  Last Vitals:  Vitals Value Taken Time  BP 110/56 02/04/2019 11:28 AM  Temp    Pulse 63 02/04/2019 11:29 AM  Resp 13 02/04/2019 11:29 AM  SpO2 100 % 02/04/2019 11:29 AM  Vitals shown include unvalidated device data.  Last Pain:  Vitals:   02/04/19 0754  TempSrc: Temporal  PainSc: 0-No pain         Complications: No apparent anesthesia complications

## 2019-02-04 NOTE — OR Nursing (Signed)
Dr Mack Guise in to see patient assessed scratch on left wrist. No new orders.

## 2019-02-04 NOTE — Anesthesia Post-op Follow-up Note (Signed)
Anesthesia QCDR form completed.        

## 2019-02-04 NOTE — Anesthesia Postprocedure Evaluation (Signed)
Anesthesia Post Note  Patient: Anthony Mosley  Procedure(s) Performed: HERNIA REPAIR INGUINAL WITH MESH (Right )  Patient location during evaluation: PACU Anesthesia Type: General Level of consciousness: awake and alert and oriented Pain management: pain level controlled Vital Signs Assessment: post-procedure vital signs reviewed and stable Respiratory status: spontaneous breathing, nonlabored ventilation and respiratory function stable Cardiovascular status: blood pressure returned to baseline and stable Postop Assessment: no signs of nausea or vomiting Anesthetic complications: no     Last Vitals:  Vitals:   02/04/19 1207 02/04/19 1254  BP:  118/66  Pulse: (!) 57 (!) 59  Resp: 14 16  Temp: 36.5 C   SpO2: 100% 95%    Last Pain:  Vitals:   02/04/19 1207  TempSrc: Temporal  PainSc: 0-No pain                 Arayah Krouse

## 2019-02-05 ENCOUNTER — Encounter: Payer: Self-pay | Admitting: Surgery

## 2019-02-08 LAB — SURGICAL PATHOLOGY

## 2019-02-22 ENCOUNTER — Ambulatory Visit
Admission: RE | Admit: 2019-02-22 | Discharge: 2019-02-22 | Disposition: A | Discharge status: Home / Self Care | Payer: Medicare HMO | Attending: Family Medicine | Admitting: Family Medicine

## 2019-02-22 ENCOUNTER — Other Ambulatory Visit: Payer: Self-pay

## 2019-02-22 ENCOUNTER — Ambulatory Visit (INDEPENDENT_AMBULATORY_CARE_PROVIDER_SITE_OTHER): Payer: Medicare HMO | Admitting: Family Medicine

## 2019-02-22 ENCOUNTER — Ambulatory Visit
Admission: RE | Admit: 2019-02-22 | Discharge: 2019-02-22 | Disposition: A | Discharge status: Home / Self Care | Payer: Medicare HMO | Source: Ambulatory Visit | Attending: Family Medicine | Admitting: Family Medicine

## 2019-02-22 ENCOUNTER — Other Ambulatory Visit: Payer: Self-pay | Admitting: Family Medicine

## 2019-02-22 ENCOUNTER — Encounter: Payer: Self-pay | Admitting: Family Medicine

## 2019-02-22 VITALS — BP 116/64 | HR 59 | Temp 97.4°F | Resp 14 | Ht 73.0 in | Wt 189.8 lb

## 2019-02-22 DIAGNOSIS — R197 Diarrhea, unspecified: Secondary | ICD-10-CM | POA: Diagnosis not present

## 2019-02-22 DIAGNOSIS — I509 Heart failure, unspecified: Secondary | ICD-10-CM | POA: Diagnosis not present

## 2019-02-22 DIAGNOSIS — D649 Anemia, unspecified: Secondary | ICD-10-CM | POA: Diagnosis not present

## 2019-02-22 DIAGNOSIS — E538 Deficiency of other specified B group vitamins: Secondary | ICD-10-CM | POA: Diagnosis not present

## 2019-02-22 DIAGNOSIS — A0811 Acute gastroenteropathy due to Norwalk agent: Secondary | ICD-10-CM | POA: Diagnosis not present

## 2019-02-22 DIAGNOSIS — J90 Pleural effusion, not elsewhere classified: Secondary | ICD-10-CM | POA: Diagnosis not present

## 2019-02-22 DIAGNOSIS — R140 Abdominal distension (gaseous): Secondary | ICD-10-CM | POA: Diagnosis not present

## 2019-02-22 NOTE — Patient Instructions (Signed)
Please do call or go to the emergency department if you feel worse, blood in the stool develops, or you get nausea / vomiting, abdominal distension, loss of appetite We'll get labs today and stool studies Please go across the street for the xrays when you leave here

## 2019-02-22 NOTE — Assessment & Plan Note (Signed)
Weight gain and leg edema noted since last visit; weighing daily, notify Dr. Saunders Revel if gain 2 or more pounds overnight or 5 or more pounds in a week; continue diuretic; stressed limiting salt; compression stockings recommended

## 2019-02-22 NOTE — Progress Notes (Signed)
BP 116/64   Pulse (!) 59   Temp (!) 97.4 F (36.3 C) (Oral)   Resp 14   Ht 6\' 1"  (1.854 m)   Wt 189 lb 12.8 oz (86.1 kg)   SpO2 94%   BMI 25.04 kg/m    Subjective:    Patient ID: Anthony Mosley, male    DOB: 1935/02/26, 84 y.o.   MRN: [id number]  HPI: Anthony Mosley is a 84 y.o. male  Chief Complaint  Patient presents with  . Follow-up  . Diarrhea    3 days    HPI Here for f/u He has been under some stress with caregiver responsibilities; wife is at home after hospitalization for hypoxia; has oxygen at home He needs some help caring for her; that will cover for someone to come out during the day; he has a policy  He developed diarrhea on Friday His stomach cramps and rolls and gargles, gurgling sounds He went to the bathroom 9 nines on Saturday; yesterday, he only went 3 times; he has 4 times today; Bristol scale 7; no blood in the stool No sick animals Has been at the hospital a lot lately; wife also having some diarrhea No fevers; no pain Having some acid up in his chest, getting more heartburn Healing up okay from hernia surgery he says; he does have a round ridge under the scar; not sure if scar tissue developing; getting pain once in a while, like burning there down below the surgical scar where his leg meets Weight gain since last visit; Dr. Saunders Revel increase diuretic from one a day to two a day; still having fluid in the legs; cannot wear the compression stockings; he admits to eating salt; not avoiding salt; has to have some salt on things, cooks with salt; congestive heart failure; heart is weak and not pumping it back  Depression screen [facility] 2/9 02/22/2019 01/04/2019 11/19/2018 11/10/2018 08/24/2018  Decreased Interest 1 0 0 0 0  Down, Depressed, Hopeless 1 0 0 0 0  PHQ - 2 Score 2 0 0 0 0  Altered sleeping 1 0 0 0 0  Tired, decreased energy 1 0 0 0 0  Change in appetite 0 0 0 0 0  Feeling bad or failure about yourself  0 0 0 0 0  Trouble concentrating 1 0 0 0  0  Moving slowly or fidgety/restless 0 0 0 0 0  Suicidal thoughts 0 0 0 0 0  PHQ-9 Score 5 0 0 0 0  Difficult doing work/chores Somewhat difficult Not difficult at all Not difficult at all Not difficult at all -   Fall Risk  02/22/2019 01/20/2019 01/15/2019 01/04/2019 11/19/2018  Falls in the past year? 0 0 0 1 0  Number falls in past yr: 0 - - 1 -  Injury with Fall? 0 - - 1 -  Risk for fall due to : - - - - -  Risk for fall due to: Comment - - - - -  Follow up - Falls evaluation completed - - -    Relevant past medical, surgical, family and social history reviewed Past Medical History:  Diagnosis Date  . Arthritis   . 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 seen on CT.  [name] CAD (coronary artery disease)    a.  Elective R/LHC 06/12/18 showed mild-moderate nonobstructive RCA CAD with mild LAD myocardial bridging but no significant obstructive CAD; there was mildly elevated L heart filling pressure, moderately elevated R heart  filling pressure, and mild pulm HTN.  . Cancer ([place])    SKIN  . Chronic knee pain   . Cough   . Edema    FEET/LEGS  . Former tobacco use   . Hemorrhoids   . Hyperbilirubinemia   . Hyperlipidemia   . Mild aortic insufficiency   . Mild pulmonary hypertension ([place])   . Moderate mitral regurgitation   . NICM (nonischemic cardiomyopathy) ([place])    a. EF 40-45% by echo 05/2017.  [name] Pleural effusion, left 11/2018  . Pneumonia   . PONV (postoperative nausea and vomiting)    after first cataract  . Rosacea   . Ruptured spleen   . Thoracic aortic aneurysm ([place])    a. 4.4cm by CT 12/2017, imaged incompletely at 4.3cm in 10/2018 CT.  [name] Wears dentures    partial upper  . Wheezing    Past Surgical History:  Procedure Laterality Date  . APPENDECTOMY     Dr Emilio Math  . CARDIAC CATHETERIZATION  2005  . CARDIAC CATHETERIZATION  2002  . CATARACT EXTRACTION W/PHACO Right 10/24/2015   Procedure: CATARACT EXTRACTION PHACO AND INTRAOCULAR LENS PLACEMENT (IOC);  Surgeon: [name], MD;   Location: [facility];  Service: Ophthalmology;  Laterality: Right;  [place] 00:52 AP% 23.5 CDE 12.35 fluid pack lot # [id number]  . CATARACT EXTRACTION W/PHACO Left 11/14/2015   Procedure: CATARACT EXTRACTION PHACO AND INTRAOCULAR LENS PLACEMENT (IOC);  Surgeon: [name], MD;  Location: [facility];  Service: Ophthalmology;  Laterality: Left;  [place] 00:55 AP% 19.1 CDE 10.66 fluid pack lot # [id number]  . COLONOSCOPY  1987  . COLONOSCOPY WITH PROPOFOL N/A 08/22/2017   Procedure: COLONOSCOPY WITH PROPOFOL;  Surgeon: [name], MD;  Location: [facility];  Service: Gastroenterology;  Laterality: N/A;  . EYE SURGERY    . HERNIA REPAIR     umbilical/ Dr Emilio Math  . INGUINAL HERNIA REPAIR Right 02/04/2019   Procedure: HERNIA REPAIR INGUINAL WITH MESH;  Surgeon: Olean Ree, MD;  Location: [facility];  Service: General;  Laterality: Right;  . IR ANGIOGRAM VISCERAL SELECTIVE  10/29/2018  . IR EMBO ART  VEN HEMORR LYMPH EXTRAV  INC GUIDE ROADMAPPING  10/29/2018  . IR THORACENTESIS ASP PLEURAL SPACE W/IMG GUIDE  11/23/2018  . IR US GUIDE VASC ACCESS RIGHT  10/29/2018  . KNEE ARTHROSCOPY Bilateral   . POLYPECTOMY  08/22/2017   Procedure: POLYPECTOMY;  Surgeon: [name], MD;  Location: [place];  Service: Gastroenterology;;  . RIGHT/LEFT HEART CATH AND CORONARY ANGIOGRAPHY N/A 06/13/2017   Procedure: Right/Left Heart Cath and Coronary Angiography;  Surgeon: [name], MD;  Location: [place] [facility];  Service: Cardiovascular;  Laterality: N/A;  . TONSILLECTOMY     Family History  Problem Relation Age of Onset  . Alzheimer's disease Mother   . Pulmonary embolism Father   . Diabetes Father   . Heart attack Father 85  . Diabetes Brother   . Heart disease Brother        CABG  . Heart disease Brother        CABG  . Diabetes Brother   . Healthy Brother    Social History   Tobacco Use  . Smoking status: Former Smoker    Packs/day: 1.50    Years: 20.00    Pack years:  30.00    Types: Cigarettes    Last attempt to quit: 1987    Years since quitting: 33.2  . Smokeless tobacco: Former Systems developer    Types:  Chew    Quit date: 2000  . Tobacco comment: smoking cessation materials not required  Substance Use Topics  . Alcohol use: No    Alcohol/week: 0.0 standard drinks  . Drug use: No     Office Visit from 02/22/2019 in [facility]  AUDIT-C Score  0      Interim medical history since last visit reviewed. Allergies and medications reviewed  Review of Systems Per HPI unless specifically indicated above     Objective:    BP 116/64   Pulse (!) 59   Temp (!) 97.4 F (36.3 C) (Oral)   Resp 14   Ht 6\' 1"  (1.854 m)   Wt 189 lb 12.8 oz (86.1 kg)   SpO2 94%   BMI 25.04 kg/m   Wt Readings from Last 3 Encounters:  02/22/19 189 lb 12.8 oz (86.1 kg)  01/28/19 181 lb (82.1 kg)  01/26/19 185 lb 12 oz (84.3 kg)    Physical Exam Constitutional:      General: He is not in acute distress.    Appearance: He is well-developed.     Comments: Weight gain since last visit  HENT:     Head: Normocephalic and atraumatic.  Eyes:     General: No scleral icterus. Neck:     Thyroid: No thyromegaly.  Cardiovascular:     Rate and Rhythm: Normal rate and regular rhythm.  Pulmonary:     Effort: Pulmonary effort is normal.     Breath sounds: Normal breath sounds.  Abdominal:     General: Bowel sounds are increased. There is no distension.     Palpations: Abdomen is soft. There is no mass.     Tenderness: There is no abdominal tenderness.       Comments: Some thickening palpable near the lower aspect of the incision; not fluctuant; no erythema; patient describes "burning" at blue line  Musculoskeletal:     Right lower leg: Edema present.     Left lower leg: Edema present.  Skin:    General: Skin is warm and dry.     Coloration: Skin is not pale.  Neurological:     Mental Status: He is alert.     Coordination: Coordination normal.    Psychiatric:        Behavior: Behavior normal.        Thought Content: Thought content normal.        Judgment: Judgment normal.     Results for orders placed or performed during the hospital encounter of 02/04/19  Surgical pathology  Result Value Ref Range   SURGICAL PATHOLOGY      Surgical Pathology CASE: [id number] PATIENT: Richarda Osmond Surgical Pathology Report     SPECIMEN SUBMITTED: A. Cord lipoma B. Hernia sac  CLINICAL HISTORY: None provided  PRE-OPERATIVE DIAGNOSIS: Right inguinal hernia  POST-OPERATIVE DIAGNOSIS: Same as pre op     DIAGNOSIS: A.  CORD LIPOMA; EXCISION: - ADIPOSE AND FIBROVASCULAR TISSUE WITH CONGESTION, CLINICALLY SPERMATIC CORD LIPOMA.  B.  HERNIA SAC; EXCISION: - MESOTHELIAL LINED FIBROVASCULAR TISSUE WITH CONGESTION AND REACTIVE CHANGES CONSISTENT WITH HERNIA [place]. - NEGATIVE FOR MALIGNANCY.   GROSS DESCRIPTION: A. Labeled: Cord lipoma Received: Formalin Tissue fragment(s): 3 Size: Aggregate, 5.5 x 2.8 x 0.5 cm Description: Irregular fragments of tan-yellow adipose tissue admixed with minimal tan-pink membranous tissue.  No abnormalities or mass lesions are grossly identified. Representative sections are submitted in cassette 1.  B. Labeled: Hernia sac Received: Formalin Tissue frag ment(s): 1 Size: 4.5  x 2.5 x 1.2 cm Description: Irregular fragment of tan-purple fibromembranous tissue. No abnormalities or mass lesions are grossly identified. Representative sections are submitted in cassette 1.   Final Diagnosis performed by Quay Burow, MD.   Electronically signed 02/08/2019 9:49:39AM The electronic signature indicates that the named Attending Pathologist has evaluated the specimen  Technical component performed at [facility], [address], [place], [place] [address] Lab: [phone] Dir: Rush Farmer, MD, MMM  Professional component performed at [facility], [facility], [place], [place], [place] [address] Lab: [phone] Dir: Dellia Nims. Rubinas, MD       Assessment & Plan:   Problem List Items Addressed This Visit      Cardiovascular and Mediastinum   Congestive heart failure (CHF) (HCC)    Weight gain and leg edema noted since last visit; weighing daily, notify Dr. Saunders Revel if gain 2 or more pounds overnight or 5 or more pounds in a week; continue diuretic; stressed limiting salt; compression stockings recommended       Other Visit Diagnoses    Diarrhea, unspecified type    -  Primary   recent surgery, recent trips to the hospital; labs to check for C diff, other pathogens; AAS to r/o obstruction; to ER if worsening   Relevant Orders   Gastrointestinal Pathogen Panel PCR   DG Abd Acute W/Chest   CBC with Differential/Platelet   COMPLETE METABOLIC PANEL WITH GFR   Magnesium       Follow up plan: Return in about 2 days (around 02/24/2019) for follow-up visit with Dr. Sanda Klein or Benjamine Mola.  An after-visit summary was printed and given to the patient at [facility].  Please see the patient instructions which may contain other information and recommendations beyond what is mentioned above in the assessment and plan.  No orders of the defined types were placed in this encounter.   Orders Placed This Encounter  Procedures  . DG Abd Acute W/Chest  . Gastrointestinal Pathogen Panel PCR  . CBC with Differential/Platelet  . COMPLETE METABOLIC PANEL WITH GFR  . Magnesium

## 2019-02-22 NOTE — Progress Notes (Signed)
Anthony Mosley, Please ADD ON labs to blood, already ordered

## 2019-02-23 ENCOUNTER — Other Ambulatory Visit: Payer: Self-pay

## 2019-02-23 DIAGNOSIS — D649 Anemia, unspecified: Secondary | ICD-10-CM

## 2019-02-23 LAB — GASTROINTESTINAL PATHOGEN PANEL PCR
C. difficile Tox A/B, PCR: NOT DETECTED
Campylobacter, PCR: NOT DETECTED
Cryptosporidium, PCR: NOT DETECTED
E coli (ETEC) LT/ST PCR: NOT DETECTED
E coli (STEC) stx1/stx2, PCR: NOT DETECTED
E coli 0157, PCR: NOT DETECTED
Giardia lamblia, PCR: NOT DETECTED
Norovirus, PCR: DETECTED — AB
Rotavirus A, PCR: NOT DETECTED
Salmonella, PCR: NOT DETECTED
Shigella, PCR: NOT DETECTED

## 2019-02-24 ENCOUNTER — Encounter: Payer: Medicare HMO | Admitting: Surgery

## 2019-02-24 LAB — CBC WITH DIFFERENTIAL/PLATELET
Absolute Monocytes: 484 cells/uL (ref 200–950)
Basophils Absolute: 21 cells/uL (ref 0–200)
Basophils Relative: 0.4 %
Eosinophils Absolute: 99 cells/uL (ref 15–500)
Eosinophils Relative: 1.9 %
HCT: 32.1 % — ABNORMAL LOW (ref 38.5–50.0)
Hemoglobin: 10.8 g/dL — ABNORMAL LOW (ref 13.2–17.1)
Lymphs Abs: 1503 cells/uL (ref 850–3900)
MCH: 29.5 pg (ref 27.0–33.0)
MCHC: 33.6 g/dL (ref 32.0–36.0)
MCV: 87.7 fL (ref 80.0–100.0)
MPV: 10.8 fL (ref 7.5–12.5)
Monocytes Relative: 9.3 %
NEUTROS PCT: 59.5 %
Neutro Abs: 3094 cells/uL (ref 1500–7800)
PLATELETS: 166 10*3/uL (ref 140–400)
RBC: 3.66 10*6/uL — ABNORMAL LOW (ref 4.20–5.80)
RDW: 13.9 % (ref 11.0–15.0)
Total Lymphocyte: 28.9 %
WBC: 5.2 10*3/uL (ref 3.8–10.8)

## 2019-02-24 LAB — COMPLETE METABOLIC PANEL WITH GFR
AG Ratio: 1.5 (calc) (ref 1.0–2.5)
ALT: 11 U/L (ref 9–46)
AST: 17 U/L (ref 10–35)
Albumin: 3.8 g/dL (ref 3.6–5.1)
Alkaline phosphatase (APISO): 58 U/L (ref 35–144)
BUN: 17 mg/dL (ref 7–25)
CO2: 27 mmol/L (ref 20–32)
CREATININE: 0.95 mg/dL (ref 0.70–1.11)
Calcium: 8.6 mg/dL (ref 8.6–10.3)
Chloride: 107 mmol/L (ref 98–110)
GFR, EST NON AFRICAN AMERICAN: 75 mL/min/{1.73_m2} (ref >=60)
GFR, Est African American: 87 mL/min/{1.73_m2} (ref >=60)
Globulin: 2.5 g/dL (calc) (ref 1.9–3.7)
Glucose, Bld: 103 mg/dL — ABNORMAL HIGH (ref 65–99)
Potassium: 4.1 mmol/L (ref 3.5–5.3)
Sodium: 140 mmol/L (ref 135–146)
Total Bilirubin: 0.7 mg/dL (ref 0.2–1.2)
Total Protein: 6.3 g/dL (ref 6.1–8.1)

## 2019-02-24 LAB — IRON,TIBC AND FERRITIN PANEL
%SAT: 14 % (calc) — ABNORMAL LOW (ref 20–48)
Ferritin: 233 ng/mL (ref 24–380)
Iron: 33 ug/dL — ABNORMAL LOW (ref 50–180)
TIBC: 232 mcg/dL (calc) — ABNORMAL LOW (ref 250–425)

## 2019-02-24 LAB — TEST AUTHORIZATION

## 2019-02-24 LAB — B12 AND FOLATE PANEL
Folate: 6.4 ng/mL
VITAMIN B 12: 284 pg/mL (ref 200–1100)

## 2019-02-24 LAB — RETICULOCYTES
ABS Retic: 34020 cells/uL (ref 25000–9000)
Retic Ct Pct: 0.9 %

## 2019-02-24 LAB — PATHOLOGIST SMEAR REVIEW

## 2019-02-24 LAB — MAGNESIUM: Magnesium: 1.8 mg/dL (ref 1.5–2.5)

## 2019-02-25 ENCOUNTER — Telehealth: Payer: Self-pay | Admitting: Family Medicine

## 2019-02-25 NOTE — Telephone Encounter (Signed)
Patient feeling better, appt canceled

## 2019-02-25 NOTE — Telephone Encounter (Signed)
Please call and check on patient He was diagnosed with Norovirus earlier this week If he is starting to feel better, we actually would prefer that he NOT come into the office since this is so very contagious Discuss his symptoms with another provider today if he is dehydrated or may need fluids or other care (I am out all day today) Thank you

## 2019-02-26 ENCOUNTER — Ambulatory Visit: Payer: Medicare HMO | Admitting: Family Medicine

## 2019-03-05 ENCOUNTER — Ambulatory Visit (INDEPENDENT_AMBULATORY_CARE_PROVIDER_SITE_OTHER): Payer: Medicare HMO | Admitting: Surgery

## 2019-03-05 ENCOUNTER — Encounter: Payer: Self-pay | Admitting: Surgery

## 2019-03-05 ENCOUNTER — Other Ambulatory Visit: Payer: Self-pay

## 2019-03-05 VITALS — BP 115/65 | HR 48 | Temp 97.5°F | Ht 72.0 in | Wt 187.0 lb

## 2019-03-05 DIAGNOSIS — K4090 Unilateral inguinal hernia, without obstruction or gangrene, not specified as recurrent: Secondary | ICD-10-CM

## 2019-03-05 DIAGNOSIS — Z09 Encounter for follow-up examination after completed treatment for conditions other than malignant neoplasm: Secondary | ICD-10-CM

## 2019-03-05 NOTE — Patient Instructions (Signed)
Return as needed.The patient is aware to call back for any questions or concerns.  

## 2019-03-05 NOTE — Progress Notes (Signed)
03/05/2019  HPI: Anthony Mosley is a 84 y.o. male s/p right inguinal hernia repair on 2/20.  He presents today for follow up.  He had a bout of Norovirus about two weeks ago and has since recovered.  He reports from hernia standpoint, he initially had swelling and discomfort at the groin, but has since improved significantly.  Nowadays, he only gets a "twitch" occasionally at the groin depending on what he's doing.  Denies any issues with the incision.  Vital signs: BP 115/65   Pulse (!) 48   Temp (!) 97.5 F (36.4 C) (Skin)   Ht 6' (1.829 m)   Wt 187 lb (84.8 kg)   SpO2 97%   BMI 25.36 kg/m    Physical Exam: Constitutional: No acute distress Abdomen:  Soft, non-distended, non-tender to palpation.  Right groin incision is clean, dry, intact, healing well.  There is small area of firmness under incision consistent with scar tissue.  No hernia recurrence.  Assessment/Plan: This is a 84 y.o. male s/p right inguinal hernia repair.  --Patient doing well, healing without complications and without recurrence. --Patient may resume normal activities today.  Encouraged to take things slowly and ramp up activity as tolerated. --Follow up prn.   Melvyn Neth, [facility]

## 2019-03-24 ENCOUNTER — Encounter: Payer: Self-pay | Admitting: Family Medicine

## 2019-03-24 DIAGNOSIS — A0811 Acute gastroenteropathy due to Norwalk agent: Secondary | ICD-10-CM | POA: Insufficient documentation

## 2019-03-24 DIAGNOSIS — E538 Deficiency of other specified B group vitamins: Secondary | ICD-10-CM | POA: Insufficient documentation

## 2019-04-02 ENCOUNTER — Ambulatory Visit: Payer: Medicare HMO | Admitting: Internal Medicine

## 2019-04-13 DIAGNOSIS — L219 Seborrheic dermatitis, unspecified: Secondary | ICD-10-CM | POA: Diagnosis not present

## 2019-04-13 DIAGNOSIS — L570 Actinic keratosis: Secondary | ICD-10-CM | POA: Diagnosis not present

## 2019-04-13 DIAGNOSIS — L718 Other rosacea: Secondary | ICD-10-CM | POA: Diagnosis not present

## 2019-04-13 DIAGNOSIS — Z85828 Personal history of other malignant neoplasm of skin: Secondary | ICD-10-CM | POA: Diagnosis not present

## 2019-04-30 ENCOUNTER — Telehealth: Payer: Self-pay | Admitting: *Deleted

## 2019-04-30 ENCOUNTER — Ambulatory Visit: Payer: Medicare HMO | Admitting: Internal Medicine
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
## 2019-04-30 NOTE — Telephone Encounter (Signed)
I spoke with pt this am and he refused virtual visit at this time. Pt mentioned that he is not in a financial situation to pay for this visit and doesn't seem to be necessary because the provider will not be able to listen to his heart or exam him. He refused to reschedule at this time bc he is taking care of his disabled wife at this time and he would not be able to come in for a ov at this time. Pt will contact office to schedule visit if he has any problems but he refused a visit at this time.

## 2019-05-11 ENCOUNTER — Other Ambulatory Visit: Payer: Self-pay | Admitting: Internal Medicine

## 2019-05-11 ENCOUNTER — Other Ambulatory Visit: Payer: Self-pay | Admitting: Physician Assistant

## 2019-06-04 ENCOUNTER — Ambulatory Visit: Payer: Medicare HMO | Admitting: Family Medicine

## 2019-06-22 ENCOUNTER — Encounter: Payer: Self-pay | Admitting: Family Medicine

## 2019-06-22 ENCOUNTER — Ambulatory Visit (INDEPENDENT_AMBULATORY_CARE_PROVIDER_SITE_OTHER): Payer: Medicare HMO | Admitting: Family Medicine

## 2019-06-22 VITALS — BP 128/80 | HR 68 | Temp 97.6°F | Resp 16 | Ht 72.0 in | Wt 187.8 lb

## 2019-06-22 DIAGNOSIS — M25562 Pain in left knee: Secondary | ICD-10-CM | POA: Diagnosis not present

## 2019-06-22 DIAGNOSIS — G8929 Other chronic pain: Secondary | ICD-10-CM | POA: Diagnosis not present

## 2019-06-22 DIAGNOSIS — M25561 Pain in right knee: Secondary | ICD-10-CM | POA: Diagnosis not present

## 2019-06-22 DIAGNOSIS — M65339 Trigger finger, unspecified middle finger: Secondary | ICD-10-CM

## 2019-06-22 NOTE — Progress Notes (Signed)
Name: Anthony Mosley   MRN: [id number]    DOB: Dec 05, 1935   Date:06/22/2019       Progress Note  Subjective  Chief Complaint  Chief Complaint  Patient presents with  . Knee Pain     bilateral wants referral  . Hand Pain    finger tightness and locks    HPI  Pt presents with concern for bilateral knee pain ongoing for several years.  Notes was supposed to have surgery on the right knee last year but had to postpone.  His LEFT knee has gotten worse over the last year. Was seeing [facility] for joint injections.  His wife is in poor health and he is the caregiver, so he may not be able to proceed with knee replacement, but would like to explore injections and possible arthroscopy.  No numbness/tingling.  We will refer today.  Hand pain: He is getting trigger finger in the bilateral middle fingers.  The right middle finger is catching frequently and is having to manually open the right finger. He lifts his wife from her wheelchair to car/bed/seats etc and notes this causes stress to his hands. Some weakness to the bilateral middle fingers.   Patient Active Problem List   Diagnosis Date Noted  . Norovirus 03/24/2019  . Vitamin B12 deficiency 03/24/2019  . Congestive heart failure (CHF) ([place]) 02/22/2019  . Right inguinal hernia 01/21/2019  . Multifocal atrial tachycardia ([place]) 11/11/2018  . Bilateral leg edema 11/11/2018  . Splenic laceration, subsequent encounter 11/10/2018  . Acute CHF (congestive heart failure) ([place]) 11/10/2018  . Hemoperitoneum   . Near syncope   . Splenic rupture 10/29/2018  . Prediabetes 08/27/2018  . Gilbert syndrome 05/13/2018  . Medication monitoring encounter 05/12/2018  . Benign neoplasm of ascending colon   . Rectal polyp   . Benign neoplasm of descending colon   . Positive colorectal cancer screening using Cologuard test 08/07/2017  . Coronary artery disease involving native coronary artery of native heart without angina pectoris 07/02/2017  .  Thoracic aortic aneurysm without rupture ([place]) 07/02/2017  . Cardiomyopathy ([place]) 06/13/2017  . Abnormal EKG 04/24/2017  . Dyspnea on exertion 04/24/2017  . Medicare annual wellness visit, subsequent 04/07/2017  . Encounter for follow-up surveillance of skin cancer 09/04/2016  . Eyelashes turned in 09/04/2016  . Hyperbilirubinemia 05/02/2016  . Benign fibroma of prostate 10/04/2015  . Atherosclerosis of coronary artery 10/04/2015  . Gonalgia 10/04/2015  . Degeneration of intervertebral disc of lumbar region 10/04/2015  . Dyslipidemia 10/04/2015  . Failure of erection 10/04/2015  . Hemorrhoid 10/04/2015  . Bilirubinemia 10/04/2015  . Blood glucose elevated 10/04/2015  . Keratosis 10/04/2015  . Chronic kidney disease (CKD), stage III (moderate) ([place]) 10/04/2015  . Long Q-T syndrome 10/04/2015  . Arthritis of knee, degenerative 10/04/2015  . Arthritis of shoulder region, degenerative 10/04/2015  . Need for vaccination 10/04/2015  . Scalp tenderness 10/04/2015  . Skin lesion 10/04/2015  . Scrotal varicose veins 10/04/2015  . Cataract of both eyes 10/04/2015  . Osteoarthritis of left knee 03/31/2015    Social History   Tobacco Use  . Smoking status: Former Smoker    Packs/day: 1.50    Years: 20.00    Pack years: 30.00    Types: Cigarettes    Quit date: 1987    Years since quitting: 33.5  . Smokeless tobacco: Former Systems developer    Types: Chew    Quit date: 2000  . Tobacco comment: smoking cessation materials not required  Substance Use Topics  . Alcohol use: No    Alcohol/week: 0.0 standard drinks     Current Outpatient Medications:  .  B COMPLEX VITAMINS PO, Take 1 tablet by mouth daily. , Disp: , Rfl:  .  Carboxymethylcellul-Glycerin (LUBRICATING EYE DROPS OP), Apply 1 drop to eye as needed (dry eyes). , Disp: , Rfl:  .  diclofenac sodium (VOLTAREN) 1 % GEL, Apply 4 g topically 4 (four) times daily. To the knee if needed (Patient taking differently: Apply 4 g topically 4  (four) times daily as needed (knee pain). ), Disp: 100 g, Rfl: 2 .  docusate sodium (COLACE) 250 MG capsule, Take 250 mg by mouth at bedtime as needed for constipation., Disp: , Rfl:  .  furosemide (LASIX) 20 MG tablet, TAKE 1 TABLET TWICE DAILY, Disp: 90 tablet, Rfl: 0 .  ibuprofen (ADVIL,MOTRIN) 600 MG tablet, Take 1 tablet (600 mg total) by mouth every 8 (eight) hours as needed for fever or mild pain., Disp: 30 tablet, Rfl: 0 .  simvastatin (ZOCOR) 40 MG tablet, TAKE 1 TABLET EVERY DAY  AT  6PM, Disp: 90 tablet, Rfl: 0 .  Guaifenesin (MUCINEX MAXIMUM STRENGTH) 1200 MG TB12, Take 1,200 mg by mouth at bedtime., Disp: , Rfl:  .  metoprolol tartrate (LOPRESSOR) 25 MG tablet, Take 0.5 tablets (12.5 mg total) by mouth 2 (two) times daily., Disp: 90 tablet, Rfl: 3 .  Misc. Devices (NOVA CUSHION GEL SEAT PAD) MISC, 1 each by Does not apply route daily., Disp: 1 each, Rfl: 0  Allergies  Allergen Reactions  . Macrolides And Ketolides Other (See Comments)    Prolonged Q-T syndrome  . Norvasc [Amlodipine] Other (See Comments)    Bradycardia  . Avodart [Dutasteride] Itching  . Tape Rash    I personally reviewed active problem list, medication list, allergies, notes from last encounter, lab results with the patient/caregiver today.  ROS  Ten systems reviewed and is negative except as mentioned in HPI  Objective  Vitals:   06/22/19 0956  BP: 128/80  Pulse: 68  Resp: 16  Temp: 97.6 F (36.4 C)  SpO2: 94%  Weight: 187 lb 12.8 oz (85.2 kg)  Height: 6' (1.829 m)    Body mass index is 25.47 kg/m.  Nursing Note and Vital Signs reviewed.  Physical Exam  Constitutional: Patient appears well-developed and well-nourished. No distress.  HENT: Head: Normocephalic and atraumatic. Ears: bilateral TMs with no erythema or effusion; Nose: Nose normal. Eyes: Conjunctivae and EOM are normal. No scleral icterus.   Neck: Normal range of motion. Neck supple. No JVD present. No thyromegaly present.   Cardiovascular: Normal rate, regular rhythm and normal heart sounds.  No murmur heard. No BLE edema. Pulmonary/Chest: Effort normal and breath sounds normal. No respiratory distress. Musculoskeletal: Normal range of motion of BLE.  Bilateral middle fingers exhibit sticking requiring manual release consistent with trigger ginger.  Bilateral knees with crepitus, no laxity, adequate strength bilaterally. Neurological: Pt is alert and oriented to person, place, and time. No cranial nerve deficit. Coordination, balance, strength, speech and gait are normal.  Skin: Skin is warm and dry. No rash noted. No erythema.  Psychiatric: Patient has a normal mood and affect. behavior is normal. Judgment and thought content normal.  No results found for this or any previous visit (from the past 72 hour(s)).  Assessment & Plan  1. Chronic pain of both knees - Continue Voltaren gel PRN - Ambulatory referral to Orthopedics  2. Trigger middle finger, unspecified laterality -  Continue Voltaren gel PRN - Ambulatory referral to Orthopedics  -Red flags and when to present for emergency care or RTC including fever >101.7F, chest pain, shortness of breath, new/worsening/un-resolving symptoms, reviewed with patient at time of visit. Follow up and care instructions discussed and provided in AVS.

## 2019-07-07 ENCOUNTER — Other Ambulatory Visit: Payer: Self-pay | Admitting: Internal Medicine

## 2019-07-13 DIAGNOSIS — M172 Bilateral post-traumatic osteoarthritis of knee: Secondary | ICD-10-CM | POA: Diagnosis not present

## 2019-07-13 DIAGNOSIS — M65341 Trigger finger, right ring finger: Secondary | ICD-10-CM | POA: Diagnosis not present

## 2019-07-13 DIAGNOSIS — M25561 Pain in right knee: Secondary | ICD-10-CM | POA: Diagnosis not present

## 2019-07-13 DIAGNOSIS — M25562 Pain in left knee: Secondary | ICD-10-CM | POA: Diagnosis not present

## 2019-07-29 ENCOUNTER — Ambulatory Visit: Payer: Self-pay

## 2019-07-30 ENCOUNTER — Other Ambulatory Visit: Payer: Self-pay

## 2019-07-30 ENCOUNTER — Ambulatory Visit (INDEPENDENT_AMBULATORY_CARE_PROVIDER_SITE_OTHER): Payer: Medicare HMO

## 2019-07-30 VITALS — BP 102/58 | Temp 97.1°F | Resp 16 | Ht 72.0 in | Wt 186.6 lb

## 2019-07-30 DIAGNOSIS — Z0000 Encounter for general adult medical examination without abnormal findings: Secondary | ICD-10-CM

## 2019-07-30 NOTE — Progress Notes (Signed)
Subjective:   Anthony Mosley is a 84 y.o. male who presents for Medicare Annual/Subsequent preventive examination.  Review of Systems:   Cardiac Risk Factors include: advanced age (>35mn, >>30women);male gender;dyslipidemia     Objective:    Vitals: BP (!) 102/58 (BP Location: Right Arm, Patient Position: Sitting, Cuff Size: Normal)   Temp (!) 97.1 F (36.2 C) (Temporal)   Resp 16   Ht 6' (1.829 m)   Wt 186 lb 9.6 oz (84.6 kg)   BMI 25.31 kg/m   Body mass index is 25.31 kg/m.  Advanced Directives 07/30/2019 02/04/2019 01/28/2019 11/10/2018 11/10/2018 11/10/2018 10/29/2018  Does Patient Have a Medical Advance Directive? Yes Yes Yes No No No No  Type of [place] will Living will Living will - - - -  Does patient want to make changes to medical advance directive? No - Patient declined No - Patient declined No - Patient declined - - - -  Copy of [place] Chart? Yes - validated most recent copy scanned in chart (See row information) - - - - - -  Would patient like information on creating a medical advance directive? - - - No - Patient declined No - Patient declined No - Patient declined Yes (Inpatient - patient requests chaplain consult to create a medical advance directive)    Tobacco Social History   Tobacco Use  Smoking Status Former Smoker  . Packs/day: 1.50  . Years: 20.00  . Pack years: 30.00  . Types: Cigarettes  . Quit date: 193 . Years since quitting: 33.6  Smokeless Tobacco Former USystems developer . Types: Chew  . Quit date: 2000  Tobacco Comment   smoking cessation materials not required     Counseling given: Not Answered Comment: smoking cessation materials not required   Clinical Intake:  Pre-visit preparation completed: Yes  Pain : No/denies pain     BMI - recorded: 25.31 Nutritional Status: BMI 25 -29 Overweight Nutritional Risks: None Diabetes: No  How often do you need to have  someone help you when you read instructions, pamphlets, or other written materials from your doctor or pharmacy?: 1 - Never  Interpreter Needed?: No  Information entered by :: KClemetine MarkerLPN  Past Medical History:  Diagnosis Date  . Arthritis   . [place] of lung ([place]    a. seen on CT.  .Marland KitchenCAD (coronary artery disease)    a.  Elective R/LHC 06/12/18 showed mild-moderate nonobstructive RCA CAD with mild LAD myocardial bridging but no significant obstructive CAD; there was mildly elevated L heart filling pressure, moderately elevated R heart filling pressure, and mild pulm HTN.  . Cancer ([place]    SKIN  . Chronic knee pain   . Cough   . Edema    FEET/LEGS  . Former tobacco use   . Hemorrhoids   . Hyperbilirubinemia   . Hyperlipidemia   . Mild aortic insufficiency   . Mild pulmonary hypertension ([place]   . Moderate mitral regurgitation   . NICM (nonischemic cardiomyopathy) ([place]    a. EF 40-45% by echo 05/2017.  .Marland KitchenPleural effusion, left 11/2018  . Pneumonia   . PONV (postoperative nausea and vomiting)    after first cataract  . Rosacea   . Ruptured spleen   . Thoracic aortic aneurysm ([place]    a. 4.4cm by CT 12/2017, imaged incompletely at 4.3cm in 10/2018 CT.  .Marland KitchenWears dentures    partial upper  .  Wheezing    Past Surgical History:  Procedure Laterality Date  . APPENDECTOMY     Dr Emilio Math  . CARDIAC CATHETERIZATION  2005  . CARDIAC CATHETERIZATION  2002  . CATARACT EXTRACTION W/PHACO Right 10/24/2015   Procedure: CATARACT EXTRACTION PHACO AND INTRAOCULAR LENS PLACEMENT (IOC);  Surgeon: [name], MD;  Location: [facility];  Service: Ophthalmology;  Laterality: Right;  [place] 00:52 AP% 23.5 CDE 12.35 fluid pack lot # [id number]  . CATARACT EXTRACTION W/PHACO Left 11/14/2015   Procedure: CATARACT EXTRACTION PHACO AND INTRAOCULAR LENS PLACEMENT (IOC);  Surgeon: [name], MD;  Location: [facility];  Service: Ophthalmology;  Laterality: Left;  [place] 00:55 AP% 19.1 CDE 10.66  fluid pack lot # [id number]  . COLONOSCOPY  1987  . COLONOSCOPY WITH PROPOFOL N/A 08/22/2017   Procedure: COLONOSCOPY WITH PROPOFOL;  Surgeon: [name], MD;  Location: [facility];  Service: Gastroenterology;  Laterality: N/A;  . EYE SURGERY    . HERNIA REPAIR     umbilical/ Dr Emilio Math  . INGUINAL HERNIA REPAIR Right 02/04/2019   Procedure: HERNIA REPAIR INGUINAL WITH MESH;  Surgeon: Olean Ree, MD;  Location: [facility];  Service: General;  Laterality: Right;  . IR ANGIOGRAM VISCERAL SELECTIVE  10/29/2018  . IR EMBO ART  VEN HEMORR LYMPH EXTRAV  INC GUIDE ROADMAPPING  10/29/2018  . IR THORACENTESIS ASP PLEURAL SPACE W/IMG GUIDE  11/23/2018  . IR US GUIDE VASC ACCESS RIGHT  10/29/2018  . KNEE ARTHROSCOPY Bilateral   . POLYPECTOMY  08/22/2017   Procedure: POLYPECTOMY;  Surgeon: [name], MD;  Location: [facility];  Service: Gastroenterology;;  . RIGHT/LEFT HEART CATH AND CORONARY ANGIOGRAPHY N/A 06/13/2017   Procedure: Right/Left Heart Cath and Coronary Angiography;  Surgeon: [name], MD;  Location: [facility];  Service: Cardiovascular;  Laterality: N/A;  . TONSILLECTOMY     Family History  Problem Relation Age of Onset  . Alzheimer's disease Mother   . Pulmonary embolism Father   . Diabetes Father   . Heart attack Father 74  . Diabetes Brother   . Heart disease Brother        CABG  . Heart disease Brother        CABG  . Diabetes Brother   . Healthy Brother    Social History   Socioeconomic History  . Marital status: Married    Spouse name: Jeanett Schlein  . Number of children: 2  . Years of education: Not on file  . Highest education level: 7th grade  Occupational History  . Occupation: Retired  Scientific laboratory technician  . Financial resource strain: Not hard at all  . Food insecurity    Worry: Never true    Inability: Never true  . Transportation needs    Medical: No    Non-medical: No  Tobacco Use  . Smoking status: Former Smoker    Packs/day:  1.50    Years: 20.00    Pack years: 30.00    Types: Cigarettes    Quit date: 1987    Years since quitting: 33.6  . Smokeless tobacco: Former Systems developer    Types: Chew    Quit date: 2000  . Tobacco comment: smoking cessation materials not required  Substance and Sexual Activity  . Alcohol use: No    Alcohol/week: 0.0 standard drinks  . Drug use: No  . Sexual activity: Never  Lifestyle  . Physical activity    Days per week: 0 days    Minutes per session: 0 min  .  Stress: Not at all  Relationships  . Social Herbalist on phone: Patient refused    Gets together: Patient refused    Attends religious service: Patient refused    Active member of club or organization: Patient refused    Attends meetings of clubs or organizations: Patient refused    Relationship status: Married  Other Topics Concern  . Not on file  Social History Narrative  . Not on file    Outpatient Encounter Medications as of 07/30/2019  Medication Sig  . B COMPLEX VITAMINS PO Take 1 tablet by mouth daily.   . Carboxymethylcellul-Glycerin (LUBRICATING EYE DROPS OP) Apply 1 drop to eye as needed (dry eyes).   [name] diclofenac sodium (VOLTAREN) 1 % GEL Apply 4 g topically 4 (four) times daily. To the knee if needed (Patient taking differently: Apply 4 g topically 4 (four) times daily as needed (knee pain). )  . docusate sodium (COLACE) 250 MG capsule Take 250 mg by mouth at bedtime as needed for constipation.  . furosemide (LASIX) 20 MG tablet TAKE 1 TABLET TWICE DAILY  . Guaifenesin (MUCINEX MAXIMUM STRENGTH) 1200 MG TB12 Take 1,200 mg by mouth at bedtime.  . hydrocortisone 2.5 % cream   . ibuprofen (ADVIL,MOTRIN) 600 MG tablet Take 1 tablet (600 mg total) by mouth every 8 (eight) hours as needed for fever or mild pain.  . simvastatin (ZOCOR) 40 MG tablet TAKE 1 TABLET EVERY DAY  AT  6PM  . [DISCONTINUED] Misc. Devices (NOVA CUSHION GEL SEAT PAD) MISC 1 each by Does not apply route daily.  . metoprolol tartrate  (LOPRESSOR) 25 MG tablet Take 0.5 tablets (12.5 mg total) by mouth 2 (two) times daily.   No facility-administered encounter medications on file as of 07/30/2019.     Activities of Daily Living In your present state of health, do you have any difficulty performing the following activities: 07/30/2019 06/22/2019  Hearing? N N  Comment declines hearing aids -  Vision? N N  Comment wears glasses -  Difficulty concentrating or making decisions? N N  Walking or climbing stairs? N N  Dressing or bathing? N N  Doing errands, shopping? N N  Preparing Food and eating ? N -  Using the Toilet? N -  In the past six months, have you accidently leaked urine? N -  Do you have problems with loss of bowel control? N -  Managing your Medications? N -  Managing your Finances? N -  Housekeeping or managing your Housekeeping? N -  Some recent data might be hidden    Patient Care Team: Lada, Satira Anis, MD as PCP - General (Family Medicine) End, Harrell Gave, MD as PCP - Cardiology (Cardiology) Watt Climes, PA as Physician Assistant (Physician Assistant) [name], MD as Referring Physician (Ophthalmology) Brendolyn Patty, MD as Consulting Physician (Dermatology) Dunn, Areta Haber, PA-C as Physician Assistant (Physician Assistant) Johnathan Hausen, MD as Consulting Physician (General Surgery)   Assessment:   This is a routine wellness examination for [place].  Exercise Activities and Dietary recommendations Current Exercise Habits: The patient does not participate in regular exercise at present, Exercise limited by: None identified  Goals    . DIET - INCREASE WATER INTAKE     Recommend to drink at least 6-8 8oz glasses of water per day.    . Patient Stated     Pt states he would like to be more active since have injections in knees  Fall Risk Fall Risk  07/30/2019 06/22/2019 02/22/2019 01/20/2019 01/15/2019  Falls in the past year? 0 0 0 0 0  Number falls in past yr: 0 0 0 - -  Injury with  Fall? 0 0 0 - -  Risk for fall due to : - - - - -  Risk for fall due to: Comment - - - - -  Follow up Falls prevention discussed - - Falls evaluation completed -   FALL RISK PREVENTION PERTAINING TO THE HOME:  Any stairs in or around the home? Yes  If so, do they handrails? Yes   Home free of loose throw rugs in walkways, pet beds, electrical cords, etc? Yes  Adequate lighting in your home to reduce risk of falls? Yes   ASSISTIVE DEVICES UTILIZED TO PREVENT FALLS:  Life alert? No  Use of a cane, walker or w/c? No  Grab bars in the bathroom? Yes  Shower chair or bench in shower? Yes  Elevated toilet seat or a handicapped toilet? Yes  DME ORDERS:  DME order needed?  No   TIMED UP AND GO:  Was the test performed? Yes .  Length of time to ambulate 10 feet: 5 sec.   GAIT:  Appearance of gait: Gait stead-fast and without the use of an assistive device.   Education: Fall risk prevention has been discussed.  Intervention(s) required? No    Depression Screen PHQ 2/9 Scores 07/30/2019 06/22/2019 02/22/2019 01/04/2019  PHQ - 2 Score '4 3 2 ' 0  PHQ- 9 Score '6 7 5 ' 0    Cognitive Function - pt declines 6CIT for 2020 AWV     6CIT Screen 05/15/2018 04/07/2017  What Year? 0 points 0 points  What month? 3 points 0 points  What time? 0 points 0 points  Count back from 20 0 points 0 points  Months in reverse 0 points 4 points  Repeat phrase 0 points 2 points  Total Score 3 6    Immunization History  Administered Date(s) Administered  . HiB (PRP-T) 11/04/2018  . Influenza, High Dose Seasonal PF 08/23/2015, 09/16/2016, 10/15/2017, 08/24/2018  . Influenza, Seasonal, Injecte, Preservative Fre 08/27/2012  . Influenza,inj,Quad PF,6+ Mos 08/23/2013, 09/05/2014  . Meningococcal Mcv4o 11/04/2018  . Pneumococcal Conjugate-13 11/17/2014  . Pneumococcal Polysaccharide-23 12/16/2008, 11/04/2018  . Zoster 06/13/2009    Qualifies for Shingles Vaccine? Yes  Zostavax completed 2010. Due for  Shingrix. Education has been provided regarding the importance of this vaccine. Pt has been advised to call insurance company to determine out of pocket expense. Advised may also receive vaccine at local pharmacy or Health Dept. Verbalized acceptance and understanding.  Tdap: Although this vaccine is not a covered service during a Wellness Exam, does the patient still wish to receive this vaccine today?  No .  Education has been provided regarding the importance of this vaccine. Advised may receive this vaccine at local pharmacy or Health Dept. Aware to provide a copy of the vaccination record if obtained from local pharmacy or Health Dept. Verbalized acceptance and understanding.  Flu Vaccine: Up to date  Pneumococcal Vaccine: Up to date   Screening Tests Health Maintenance  Topic Date Due  . INFLUENZA VACCINE  07/17/2019  . TETANUS/TDAP  12/16/2026 (Originally 11/28/1954)  . PNA vac Low Risk Adult  Completed   Cancer Screenings:  Colorectal Screening: Completed 08/22/17. Repeat every 5 years;  Lung Cancer Screening: (Low Dose CT Chest recommended if Age 45-80 years, 30 pack-year currently smoking OR have quit w/in  15years.) does not qualify.    Additional Screening:  Hepatitis C Screening: no longer required  Vision Screening: Recommended annual ophthalmology exams for early detection of glaucoma and other disorders of the eye. Is the patient up to date with their annual eye exam?  Yes  Who is the provider or what is the name of the office in which the pt attends annual eye exams? [place] Screening: Recommended annual dental exams for proper oral hygiene  Community Resource Referral:  CRR required this visit?  No       Plan:    I have personally reviewed and addressed the Medicare Annual Wellness questionnaire and have noted the following in the patient's chart:  A. Medical and social history B. Use of alcohol, tobacco or illicit drugs  C. Current  medications and supplements D. Functional ability and status E.  Nutritional status F.  Physical activity G. Advance directives H. List of other physicians I.  Hospitalizations, surgeries, and ER visits in previous 12 months J.  [place] such as hearing and vision if needed, cognitive and depression L. Referrals and appointments   In addition, I have reviewed and discussed with patient certain preventive protocols, quality metrics, and best practice recommendations. A written personalized care plan for preventive services as well as general preventive health recommendations were provided to patient.   Signed,  Clemetine Marker, LPN Nurse Health Advisor   Nurse Notes: pt accompanied to visit by his wife who was also present for her wellness visit. He is primary caretaker for her as she is wheelchair bound. Pt expresses concern about how long he will be able to take care of her and met with social worker yesterday.

## 2019-07-30 NOTE — Patient Instructions (Signed)
[name] , Thank you for taking time to come for your Medicare Wellness Visit. I appreciate your ongoing commitment to your health goals. Please review the following plan we discussed and let me know if I can assist you in the future.   Screening recommendations/referrals: Colonoscopy: done 08/22/17 Recommended yearly ophthalmology/optometry visit for glaucoma screening and checkup Recommended yearly dental visit for hygiene and checkup  Vaccinations: Influenza vaccine: done 08/24/18 Pneumococcal vaccine: done 11/04/18 Tdap vaccine: due - please contact us if you get a cut or scrape Shingles vaccine: Shingrix discussed. Please contact your pharmacy for coverage information.     Conditions/risks identified: recommend increasing physical activity  Next appointment: Please follow up in one year for your Medicare Annual Wellness visit.    Preventive Care 74 Years and Older, Male Preventive care refers to lifestyle choices and visits with your health care provider that can promote health and wellness. What does preventive care include?  A yearly physical exam. This is also called an annual well check.  Dental exams once or twice a year.  Routine eye exams. Ask your health care provider how often you should have your eyes checked.  Personal lifestyle choices, including:  Daily care of your teeth and gums.  Regular physical activity.  Eating a healthy diet.  Avoiding tobacco and drug use.  Limiting alcohol use.  Practicing safe sex.  Taking low doses of aspirin every day.  Taking vitamin and mineral supplements as recommended by your health care provider. What happens during an annual well check? The services and screenings done by your health care provider during your annual well check will depend on your age, overall health, lifestyle risk factors, and family history of disease. Counseling  Your health care provider may ask you questions about your:  Alcohol use.   Tobacco use.  Drug use.  Emotional well-being.  Home and relationship well-being.  Sexual activity.  Eating habits.  History of falls.  Memory and ability to understand (cognition).  Work and work Statistician. Screening  You may have the following tests or measurements:  Height, weight, and BMI.  Blood pressure.  Lipid and cholesterol levels. These may be checked every 5 years, or more frequently if you are over 5 years old.  Skin check.  Lung cancer screening. You may have this screening every year starting at age 105 if you have a 30-pack-year history of smoking and currently smoke or have quit within the past 15 years.  Fecal occult blood test (FOBT) of the stool. You may have this test every year starting at age 46.  Flexible sigmoidoscopy or colonoscopy. You may have a sigmoidoscopy every 5 years or a colonoscopy every 10 years starting at age 5.  Prostate cancer screening. Recommendations will vary depending on your family history and other risks.  Hepatitis C blood test.  Hepatitis B blood test.  Sexually transmitted disease (STD) testing.  Diabetes screening. This is done by checking your blood sugar (glucose) after you have not eaten for a while (fasting). You may have this done every 1-3 years.  Abdominal aortic aneurysm (AAA) screening. You may need this if you are a current or former smoker.  Osteoporosis. You may be screened starting at age 35 if you are at high risk. Talk with your health care provider about your test results, treatment options, and if necessary, the need for more tests. Vaccines  Your health care provider may recommend certain vaccines, such as:  Influenza vaccine. This is recommended every year.  Tetanus, diphtheria, and acellular pertussis (Tdap, Td) vaccine. You may need a Td booster every 10 years.  Zoster vaccine. You may need this after age 82.  Pneumococcal 13-valent conjugate (PCV13) vaccine. One dose is recommended  after age 74.  Pneumococcal polysaccharide (PPSV23) vaccine. One dose is recommended after age 59. Talk to your health care provider about which screenings and vaccines you need and how often you need them. This information is not intended to replace advice given to you by your health care provider. Make sure you discuss any questions you have with your health care provider. Document Released: 12/29/2015 Document Revised: 08/21/2016 Document Reviewed: 10/03/2015 Elsevier Interactive Patient Education  2017 [place] Prevention in the Home Falls can cause injuries. They can happen to people of all ages. There are many things you can do to make your home safe and to help prevent falls. What can I do on the outside of my home?  Regularly fix the edges of walkways and driveways and fix any cracks.  Remove anything that might make you trip as you walk through a door, such as a raised step or threshold.  Trim any bushes or trees on the path to your home.  Use bright outdoor lighting.  Clear any walking paths of anything that might make someone trip, such as rocks or tools.  Regularly check to see if handrails are loose or broken. Make sure that both sides of any steps have handrails.  Any raised decks and porches should have guardrails on the edges.  Have any leaves, snow, or ice cleared regularly.  Use sand or salt on walking paths during winter.  Clean up any spills in your garage right away. This includes oil or grease spills. What can I do in the bathroom?  Use night lights.  Install grab bars by the toilet and in the tub and shower. Do not use towel bars as grab bars.  Use non-skid mats or decals in the tub or shower.  If you need to sit down in the shower, use a plastic, non-slip stool.  Keep the floor dry. Clean up any water that spills on the floor as soon as it happens.  Remove soap buildup in the tub or shower regularly.  Attach bath mats securely with  double-sided non-slip rug tape.  Do not have throw rugs and other things on the floor that can make you trip. What can I do in the bedroom?  Use night lights.  Make sure that you have a light by your bed that is easy to reach.  Do not use any sheets or blankets that are too big for your bed. They should not hang down onto the floor.  Have a firm chair that has side arms. You can use this for support while you get dressed.  Do not have throw rugs and other things on the floor that can make you trip. What can I do in the kitchen?  Clean up any spills right away.  Avoid walking on wet floors.  Keep items that you use a lot in easy-to-reach places.  If you need to reach something above you, use a strong step stool that has a grab bar.  Keep electrical cords out of the way.  Do not use floor polish or wax that makes floors slippery. If you must use wax, use non-skid floor wax.  Do not have throw rugs and other things on the floor that can make you trip. What can I do  with my stairs?  Do not leave any items on the stairs.  Make sure that there are handrails on both sides of the stairs and use them. Fix handrails that are broken or loose. Make sure that handrails are as long as the stairways.  Check any carpeting to make sure that it is firmly attached to the stairs. Fix any carpet that is loose or worn.  Avoid having throw rugs at the top or bottom of the stairs. If you do have throw rugs, attach them to the floor with carpet tape.  Make sure that you have a light switch at the top of the stairs and the bottom of the stairs. If you do not have them, ask someone to add them for you. What else can I do to help prevent falls?  Wear shoes that:  Do not have high heels.  Have rubber bottoms.  Are comfortable and fit you well.  Are closed at the toe. Do not wear sandals.  If you use a stepladder:  Make sure that it is fully opened. Do not climb a closed stepladder.  Make  sure that both sides of the stepladder are locked into place.  Ask someone to hold it for you, if possible.  Clearly mark and make sure that you can see:  Any grab bars or handrails.  First and last steps.  Where the edge of each step is.  Use tools that help you move around (mobility aids) if they are needed. These include:  Canes.  Walkers.  Scooters.  Crutches.  Turn on the lights when you go into a dark area. Replace any light bulbs as soon as they burn out.  Set up your furniture so you have a clear path. Avoid moving your furniture around.  If any of your floors are uneven, fix them.  If there are any pets around you, be aware of where they are.  Review your medicines with your doctor. Some medicines can make you feel dizzy. This can increase your chance of falling. Ask your doctor what other things that you can do to help prevent falls. This information is not intended to replace advice given to you by your health care provider. Make sure you discuss any questions you have with your health care provider. Document Released: 09/28/2009 Document Revised: 05/09/2016 Document Reviewed: 01/06/2015 Elsevier Interactive Patient Education  2017 [employer].

## 2019-08-13 ENCOUNTER — Other Ambulatory Visit: Payer: Self-pay

## 2019-08-13 ENCOUNTER — Encounter: Payer: Self-pay | Admitting: Family Medicine

## 2019-08-13 ENCOUNTER — Ambulatory Visit (INDEPENDENT_AMBULATORY_CARE_PROVIDER_SITE_OTHER): Payer: Medicare HMO | Admitting: Family Medicine

## 2019-08-13 VITALS — BP 122/76 | HR 51 | Temp 97.9°F | Resp 18 | Ht 72.0 in | Wt 188.3 lb

## 2019-08-13 DIAGNOSIS — E611 Iron deficiency: Secondary | ICD-10-CM

## 2019-08-13 DIAGNOSIS — N183 Chronic kidney disease, stage 3 unspecified: Secondary | ICD-10-CM

## 2019-08-13 DIAGNOSIS — I509 Heart failure, unspecified: Secondary | ICD-10-CM | POA: Diagnosis not present

## 2019-08-13 DIAGNOSIS — M170 Bilateral primary osteoarthritis of knee: Secondary | ICD-10-CM

## 2019-08-13 DIAGNOSIS — I712 Thoracic aortic aneurysm, without rupture, unspecified: Secondary | ICD-10-CM

## 2019-08-13 DIAGNOSIS — R7303 Prediabetes: Secondary | ICD-10-CM

## 2019-08-13 DIAGNOSIS — E785 Hyperlipidemia, unspecified: Secondary | ICD-10-CM | POA: Diagnosis not present

## 2019-08-13 DIAGNOSIS — I429 Cardiomyopathy, unspecified: Secondary | ICD-10-CM | POA: Diagnosis not present

## 2019-08-13 DIAGNOSIS — Z23 Encounter for immunization: Secondary | ICD-10-CM | POA: Diagnosis not present

## 2019-08-13 DIAGNOSIS — I8393 Asymptomatic varicose veins of bilateral lower extremities: Secondary | ICD-10-CM

## 2019-08-13 DIAGNOSIS — I2510 Atherosclerotic heart disease of native coronary artery without angina pectoris: Secondary | ICD-10-CM | POA: Diagnosis not present

## 2019-08-13 DIAGNOSIS — E538 Deficiency of other specified B group vitamins: Secondary | ICD-10-CM | POA: Diagnosis not present

## 2019-08-13 DIAGNOSIS — R600 Localized edema: Secondary | ICD-10-CM

## 2019-08-13 DIAGNOSIS — D649 Anemia, unspecified: Secondary | ICD-10-CM | POA: Insufficient documentation

## 2019-08-13 NOTE — Progress Notes (Signed)
Name: Anthony Mosley   MRN: [id number]    DOB: 1935-11-12   Date:08/13/2019       Progress Note  Subjective  Chief Complaint  Chief Complaint  Patient presents with  . Follow-up    HPI  Pt presents for routine follow up - new to me as his PCP Dr. Sanda Klein is no longer practicing.  CHF/Hx cardiomyopathy/Long QT syndrome/Hx BLE Edema: Seeing Christell Faith PA-C with cardiology - had initial visit in February.  He denies BLE edema, chest pain, or shortness of breath.  Varicose Veins: Has veins in bilateral legs and scrotum, he does not see vein and vascular at this time, these do not cause him pain, no LE edema.  He is taking lasix daily and metoprolol daily.  CAD/HLD: Taking simvastatin and tolerating well; no chest pain, shortness of breath, or myalgias. Seeing Cardiology every 6 months.   Anemia/B12 deficiency: Had low iron and low B12 at last visit, last CBC was stable.  Due for recheck of labs today.  Taking Vitamin B12 but not iron supplement.   Rosanna Randy Syndrome: Due for labs today; no abdominal pain, no changes in BM's, no blood in stool.  Prediabetes: Denies polydipsia, polyphagia, or polyuria; needs A1C today. Not on medication at this time.  CKD Stage 3: Has CKD, not on ACE-I or ARB, due for labs today.  Athritis of Knees: Seeing ortho for injections, feeling better since steroid injections.  No concerns today.  Patient Active Problem List   Diagnosis Date Noted  . Norovirus 03/24/2019  . Vitamin B12 deficiency 03/24/2019  . Congestive heart failure (CHF) ([place]) 02/22/2019  . Right inguinal hernia 01/21/2019  . Multifocal atrial tachycardia ([place]) 11/11/2018  . Bilateral leg edema 11/11/2018  . Splenic laceration, subsequent encounter 11/10/2018  . Acute CHF (congestive heart failure) ([place]) 11/10/2018  . Hemoperitoneum   . Near syncope   . Splenic rupture 10/29/2018  . Prediabetes 08/27/2018  . Gilbert syndrome 05/13/2018  . Medication monitoring encounter 05/12/2018   . Benign neoplasm of ascending colon   . Rectal polyp   . Benign neoplasm of descending colon   . Positive colorectal cancer screening using Cologuard test 08/07/2017  . Coronary artery disease involving native coronary artery of native heart without angina pectoris 07/02/2017  . Thoracic aortic aneurysm without rupture ([place]) 07/02/2017  . Cardiomyopathy ([place]) 06/13/2017  . Abnormal EKG 04/24/2017  . Dyspnea on exertion 04/24/2017  . Medicare annual wellness visit, subsequent 04/07/2017  . Encounter for follow-up surveillance of skin cancer 09/04/2016  . Eyelashes turned in 09/04/2016  . Hyperbilirubinemia 05/02/2016  . Benign fibroma of prostate 10/04/2015  . Atherosclerosis of coronary artery 10/04/2015  . Gonalgia 10/04/2015  . Degeneration of intervertebral disc of lumbar region 10/04/2015  . Dyslipidemia 10/04/2015  . Failure of erection 10/04/2015  . Hemorrhoid 10/04/2015  . Bilirubinemia 10/04/2015  . Blood glucose elevated 10/04/2015  . Keratosis 10/04/2015  . Chronic kidney disease (CKD), stage III (moderate) ([place]) 10/04/2015  . Long Q-T syndrome 10/04/2015  . Arthritis of knee, degenerative 10/04/2015  . Arthritis of shoulder region, degenerative 10/04/2015  . Need for vaccination 10/04/2015  . Scalp tenderness 10/04/2015  . Skin lesion 10/04/2015  . Scrotal varicose veins 10/04/2015  . Cataract of both eyes 10/04/2015  . Osteoarthritis of left knee 03/31/2015    Past Surgical History:  Procedure Laterality Date  . APPENDECTOMY     Dr Emilio Math  . CARDIAC CATHETERIZATION  2005  . CARDIAC CATHETERIZATION  2002  .  CATARACT EXTRACTION W/PHACO Right 10/24/2015   Procedure: CATARACT EXTRACTION PHACO AND INTRAOCULAR LENS PLACEMENT (IOC);  Surgeon: [name], MD;  Location: [facility];  Service: Ophthalmology;  Laterality: Right;  [place] 00:52 AP% 23.5 CDE 12.35 fluid pack lot # [id number]  . CATARACT EXTRACTION W/PHACO Left 11/14/2015   Procedure: CATARACT EXTRACTION  PHACO AND INTRAOCULAR LENS PLACEMENT (IOC);  Surgeon: [name], MD;  Location: [facility];  Service: Ophthalmology;  Laterality: Left;  [place] 00:55 AP% 19.1 CDE 10.66 fluid pack lot # [id number]  . COLONOSCOPY  1987  . COLONOSCOPY WITH PROPOFOL N/A 08/22/2017   Procedure: COLONOSCOPY WITH PROPOFOL;  Surgeon: [name], MD;  Location: [facility];  Service: Gastroenterology;  Laterality: N/A;  . EYE SURGERY    . HERNIA REPAIR     umbilical/ Dr Emilio Math  . INGUINAL HERNIA REPAIR Right 02/04/2019   Procedure: HERNIA REPAIR INGUINAL WITH MESH;  Surgeon: Olean Ree, MD;  Location: [facility];  Service: General;  Laterality: Right;  . IR ANGIOGRAM VISCERAL SELECTIVE  10/29/2018  . IR EMBO ART  VEN HEMORR LYMPH EXTRAV  INC GUIDE ROADMAPPING  10/29/2018  . IR THORACENTESIS ASP PLEURAL SPACE W/IMG GUIDE  11/23/2018  . IR US GUIDE VASC ACCESS RIGHT  10/29/2018  . KNEE ARTHROSCOPY Bilateral   . POLYPECTOMY  08/22/2017   Procedure: POLYPECTOMY;  Surgeon: [name], MD;  Location: [facility];  Service: Gastroenterology;;  . RIGHT/LEFT HEART CATH AND CORONARY ANGIOGRAPHY N/A 06/13/2017   Procedure: Right/Left Heart Cath and Coronary Angiography;  Surgeon: [name], MD;  Location: [facility];  Service: Cardiovascular;  Laterality: N/A;  . TONSILLECTOMY      Family History  Problem Relation Age of Onset  . Alzheimer's disease Mother   . Pulmonary embolism Father   . Diabetes Father   . Heart attack Father 32  . Diabetes Brother   . Heart disease Brother        CABG  . Heart disease Brother        CABG  . Diabetes Brother   . Healthy Brother     Social History   Socioeconomic History  . Marital status: Married    Spouse name: Jeanett Schlein  . Number of children: 2  . Years of education: Not on file  . Highest education level: 7th grade  Occupational History  . Occupation: Retired  Scientific laboratory technician  . Financial resource strain: Not hard at all  . Food  insecurity    Worry: Never true    Inability: Never true  . Transportation needs    Medical: No    Non-medical: No  Tobacco Use  . Smoking status: Former Smoker    Packs/day: 1.50    Years: 20.00    Pack years: 30.00    Types: Cigarettes    Quit date: 1987    Years since quitting: 33.6  . Smokeless tobacco: Former Systems developer    Types: Chew    Quit date: 2000  . Tobacco comment: smoking cessation materials not required  Substance and Sexual Activity  . Alcohol use: No    Alcohol/week: 0.0 standard drinks  . Drug use: No  . Sexual activity: Never  Lifestyle  . Physical activity    Days per week: 0 days    Minutes per session: 0 min  . Stress: Not at all  Relationships  . Social connections    Talks on phone: Patient refused    Gets together: Patient refused    Attends religious  service: Patient refused    Active member of club or organization: Patient refused    Attends meetings of clubs or organizations: Patient refused    Relationship status: Married  . Intimate partner violence    Fear of current or ex partner: No    Emotionally abused: No    Physically abused: No    Forced sexual activity: No  Other Topics Concern  . Not on file  Social History Narrative  . Not on file     Current Outpatient Medications:  .  B COMPLEX VITAMINS PO, Take 1 tablet by mouth daily. , Disp: , Rfl:  .  Carboxymethylcellul-Glycerin (LUBRICATING EYE DROPS OP), Apply 1 drop to eye as needed (dry eyes). , Disp: , Rfl:  .  diclofenac sodium (VOLTAREN) 1 % GEL, Apply 4 g topically 4 (four) times daily. To the knee if needed (Patient taking differently: Apply 4 g topically 4 (four) times daily as needed (knee pain). ), Disp: 100 g, Rfl: 2 .  docusate sodium (COLACE) 250 MG capsule, Take 250 mg by mouth at bedtime as needed for constipation., Disp: , Rfl:  .  furosemide (LASIX) 20 MG tablet, TAKE 1 TABLET TWICE DAILY, Disp: 90 tablet, Rfl: 0 .  Guaifenesin (MUCINEX MAXIMUM STRENGTH) 1200 MG TB12,  Take 1,200 mg by mouth at bedtime., Disp: , Rfl:  .  hydrocortisone 2.5 % cream, , Disp: , Rfl:  .  ibuprofen (ADVIL,MOTRIN) 600 MG tablet, Take 1 tablet (600 mg total) by mouth every 8 (eight) hours as needed for fever or mild pain., Disp: 30 tablet, Rfl: 0 .  simvastatin (ZOCOR) 40 MG tablet, TAKE 1 TABLET EVERY DAY  AT  6PM, Disp: 90 tablet, Rfl: 0 .  metoprolol tartrate (LOPRESSOR) 25 MG tablet, Take 0.5 tablets (12.5 mg total) by mouth 2 (two) times daily., Disp: 90 tablet, Rfl: 3  Allergies  Allergen Reactions  . Macrolides And Ketolides Other (See Comments)    Prolonged Q-T syndrome  . Norvasc [Amlodipine] Other (See Comments)    Bradycardia  . Avodart [Dutasteride] Itching  . Tape Rash    I personally reviewed active problem list, medication list, allergies, health maintenance, notes from last encounter, lab results with the patient/caregiver today.   ROS  Constitutional: Negative for fever or weight change.  Respiratory: Negative for cough and shortness of breath.   Cardiovascular: Negative for chest pain or palpitations.  Gastrointestinal: Negative for abdominal pain, no bowel changes.  Musculoskeletal: Negative for gait problem or joint swelling.  Skin: Negative for rash.  Neurological: Negative for dizziness or headache.  No other specific complaints in a complete review of systems (except as listed in HPI above).  Objective  Vitals:   08/13/19 1311  BP: 122/76  Pulse: (!) 51  Resp: 18  Temp: 97.9 F (36.6 C)  TempSrc: Oral  SpO2: 99%  Weight: 188 lb 4.8 oz (85.4 kg)  Height: 6' (1.829 m)    Body mass index is 25.54 kg/m.  Physical Exam Constitutional: Patient appears well-developed and well-nourished. No distress.  HENT: Head: Normocephalic and atraumatic. Eyes: Conjunctivae and EOM are normal. No scleral icterus. Neck: Normal range of motion. Neck supple. No JVD present. No thyromegaly present.  Cardiovascular: Normal rate, regular rhythm and normal  heart sounds.  No murmur heard. No BLE edema. There are varicose veins to the LLE, no tenderness to bilateral calves, no hardening or stiffness. There are +2 pedal pulses, cap refill <3sec bilaterally. Pulmonary/Chest: Effort normal and breath sounds normal.  No respiratory distress. Musculoskeletal: Normal range of motion, no joint effusions. No gross deformities Neurological: Pt is alert and oriented to person, place, and time. No cranial nerve deficit. Coordination, balance, strength, speech and gait are normal.  Skin: Skin is warm and dry. No rash noted. No erythema. Psychiatric: Patient has a normal mood and affect. behavior is normal. Judgment and thought content normal.  No results found for this or any previous visit (from the past 72 hour(s)).  PHQ2/9: Depression screen [facility] 2/9 08/13/2019 07/30/2019 06/22/2019 02/22/2019 01/04/2019  Decreased Interest 0 3 1 1  0  Down, Depressed, Hopeless 0 1 2 1  0  PHQ - 2 Score 0 4 3 2  0  Altered sleeping 0 0 1 1 0  Tired, decreased energy 0 0 3 1 0  Change in appetite 0 0 0 0 0  Feeling bad or failure about yourself  0 0 0 0 0  Trouble concentrating 0 1 0 1 0  Moving slowly or fidgety/restless 0 1 0 0 0  Suicidal thoughts 0 0 0 0 0  PHQ-9 Score 0 6 7 5  0  Difficult doing work/chores Not difficult at all Somewhat difficult Somewhat difficult Somewhat difficult Not difficult at all  Some recent data might be hidden   PHQ-2/9 Result is negative.    Fall Risk: Fall Risk  08/13/2019 07/30/2019 06/22/2019 02/22/2019 01/20/2019  Falls in the past year? 0 0 0 0 0  Number falls in past yr: 0 0 0 0 -  Injury with Fall? 0 0 0 0 -  Risk for fall due to : - - - - -  Risk for fall due to: Comment - - - - -  Follow up - Falls prevention discussed - - Falls evaluation completed   Assessment & Plan  1. Congestive heart failure, unspecified HF chronicity, unspecified heart failure type ([place]) - Continue with cardiology; on beta blocker and lasix - COMPLETE METABOLIC  PANEL WITH GFR - Lipid panel  2. Coronary artery disease involving native coronary artery of native heart without angina pectoris - Continue statin therapy and with Cardiology follow up - COMPLETE METABOLIC PANEL WITH GFR - Lipid panel - Taking statin therapy  3. Cardiomyopathy, unspecified type ([place]) - Continue with cardiology; taking beta blocker and lasix - COMPLETE METABOLIC PANEL WITH GFR - Lipid panel  4. Thoracic aortic aneurysm without rupture ([facility]) - COMPLETE METABOLIC PANEL WITH GFR - Lipid panel  5. Needs flu shot - Flu Vaccine QUAD High Dose(Fluad)  6. Atherosclerosis of coronary artery of native heart without angina pectoris, unspecified vessel or lesion type - Statin therapy - Lipid panel  7. Vitamin B12 deficiency - CBC with Differential/Platelet - Taking supplement - B12 and Folate Panel  8. Dyslipidemia - Statin therapy - Lipid panel  9. Bilateral leg edema - Doing well on Lasix  10. Gilbert syndrome - CMP  11. Prediabetes - COMPLETE METABOLIC PANEL WITH GFR - Hemoglobin A1c  12. Chronic kidney disease (CKD), stage III (moderate) ([facility]) - CMP per orders  13. Primary osteoarthritis of both knees - Stable at this time  14. Anemia, unspecified type - CBC with Differential/Platelet  15. Iron deficiency - Taking supplement - CBC with Differential/Platelet - Iron, TIBC and Ferritin Panel

## 2019-08-14 LAB — COMPLETE METABOLIC PANEL WITH GFR
AG Ratio: 1.8 (calc) (ref 1.0–2.5)
ALT: 16 U/L (ref 9–46)
AST: 20 U/L (ref 10–35)
Albumin: 4.2 g/dL (ref 3.6–5.1)
Alkaline phosphatase (APISO): 53 U/L (ref 35–144)
BUN/Creatinine Ratio: 20 (calc) (ref 6–22)
BUN: 26 mg/dL — ABNORMAL HIGH (ref 7–25)
CO2: 31 mmol/L (ref 20–32)
Calcium: 9.3 mg/dL (ref 8.6–10.3)
Chloride: 102 mmol/L (ref 98–110)
Creat: 1.28 mg/dL — ABNORMAL HIGH (ref 0.70–1.11)
GFR, Est African American: 60 mL/min/{1.73_m2} (ref >=60)
GFR, Est Non African American: 52 mL/min/{1.73_m2} — ABNORMAL LOW (ref >=60)
Globulin: 2.4 g/dL (calc) (ref 1.9–3.7)
Glucose, Bld: 98 mg/dL (ref 65–99)
Potassium: 4.6 mmol/L (ref 3.5–5.3)
Sodium: 140 mmol/L (ref 135–146)
Total Bilirubin: 1.3 mg/dL — ABNORMAL HIGH (ref 0.2–1.2)
Total Protein: 6.6 g/dL (ref 6.1–8.1)

## 2019-08-14 LAB — HEMOGLOBIN A1C
Hgb A1c MFr Bld: 5.6 % of total Hgb (ref <5.7)
Mean Plasma Glucose: 114 (calc)
eAG (mmol/L): 6.3 (calc)

## 2019-08-14 LAB — LIPID PANEL
Cholesterol: 140 mg/dL (ref <200)
HDL: 52 mg/dL (ref >=40)
LDL Cholesterol (Calc): 75 mg/dL (calc)
Non-HDL Cholesterol (Calc): 88 mg/dL (calc) (ref <130)
Total CHOL/HDL Ratio: 2.7 (calc) (ref <5.0)
Triglycerides: 52 mg/dL (ref <150)

## 2019-08-14 LAB — CBC WITH DIFFERENTIAL/PLATELET
Absolute Monocytes: 416 cells/uL (ref 200–950)
Basophils Absolute: 68 cells/uL (ref 0–200)
Basophils Relative: 1.2 %
Eosinophils Absolute: 120 cells/uL (ref 15–500)
Eosinophils Relative: 2.1 %
HCT: 39.9 % (ref 38.5–50.0)
Hemoglobin: 13.3 g/dL (ref 13.2–17.1)
Lymphs Abs: 1773 cells/uL (ref 850–3900)
MCH: 31.1 pg (ref 27.0–33.0)
MCHC: 33.3 g/dL (ref 32.0–36.0)
MCV: 93.2 fL (ref 80.0–100.0)
MPV: 10.5 fL (ref 7.5–12.5)
Monocytes Relative: 7.3 %
Neutro Abs: 3323 cells/uL (ref 1500–7800)
Neutrophils Relative %: 58.3 %
Platelets: 162 10*3/uL (ref 140–400)
RBC: 4.28 10*6/uL (ref 4.20–5.80)
RDW: 12.9 % (ref 11.0–15.0)
Total Lymphocyte: 31.1 %
WBC: 5.7 10*3/uL (ref 3.8–10.8)

## 2019-08-14 LAB — IRON,TIBC AND FERRITIN PANEL
%SAT: 35 % (calc) (ref 20–48)
Ferritin: 218 ng/mL (ref 24–380)
Iron: 94 ug/dL (ref 50–180)
TIBC: 265 mcg/dL (calc) (ref 250–425)

## 2019-08-14 LAB — B12 AND FOLATE PANEL
Folate: >24 ng/mL
Vitamin B-12: 440 pg/mL (ref 200–1100)

## 2019-08-16 ENCOUNTER — Other Ambulatory Visit: Payer: Self-pay | Admitting: Family Medicine

## 2019-08-16 DIAGNOSIS — E611 Iron deficiency: Secondary | ICD-10-CM | POA: Insufficient documentation

## 2019-08-16 DIAGNOSIS — I8393 Asymptomatic varicose veins of bilateral lower extremities: Secondary | ICD-10-CM | POA: Insufficient documentation

## 2019-08-16 DIAGNOSIS — N183 Chronic kidney disease, stage 3 unspecified: Secondary | ICD-10-CM

## 2019-08-18 ENCOUNTER — Telehealth: Payer: Self-pay | Admitting: Family Medicine

## 2019-08-18 NOTE — Chronic Care Management (AMB) (Signed)
Chronic Care Management   Note  08/18/2019 Name: Anthony Mosley MRN: [id number] DOB: 1934/12/24  Anthony Mosley is a 84 y.o. year old male who is a primary care patient of Lada, Satira Anis, MD. I reached out to [name] by phone today in response to a referral sent by [name]'s health plan.    [name] was given information about Chronic Care Management services today including:  1. CCM service includes personalized support from designated clinical staff supervised by his physician, including individualized plan of care and coordination with other care providers 2. 24/7 contact phone numbers for assistance for urgent and routine care needs. 3. Service will only be billed when office clinical staff spend 20 minutes or more in a month to coordinate care. 4. Only one practitioner may furnish and bill the service in a calendar month. 5. The patient may stop CCM services at any time (effective at the end of the month) by phone call to the office staff. 6. The patient will be responsible for cost sharing (co-pay) of up to 20% of the service fee (after annual deductible is met).  Patient agreed to services and verbal consent obtained.   Follow up plan: Telephone appointment with CCM team member scheduled for: 09/13/2019  [place]  ??[email or web address]   ??[phone]

## 2019-08-20 ENCOUNTER — Telehealth: Payer: Medicare HMO

## 2019-08-30 ENCOUNTER — Telehealth: Payer: Self-pay | Admitting: Family Medicine

## 2019-08-30 NOTE — Telephone Encounter (Signed)
-----   Message from Hubbard Hartshorn, [place] sent at 08/16/2019 12:39 PM EDT ----- Regarding: Call to come in for repeat kidney function Call to come in for repeat kidney function.

## 2019-09-01 NOTE — Telephone Encounter (Signed)
Patient notified

## 2019-09-03 ENCOUNTER — Other Ambulatory Visit: Payer: Self-pay | Admitting: Emergency Medicine

## 2019-09-03 DIAGNOSIS — N183 Chronic kidney disease, stage 3 unspecified: Secondary | ICD-10-CM

## 2019-09-04 LAB — BASIC METABOLIC PANEL WITH GFR
BUN/Creatinine Ratio: 16 (calc) (ref 6–22)
BUN: 21 mg/dL (ref 7–25)
CO2: 30 mmol/L (ref 20–32)
Calcium: 8.9 mg/dL (ref 8.6–10.3)
Chloride: 103 mmol/L (ref 98–110)
Creat: 1.33 mg/dL — ABNORMAL HIGH (ref 0.70–1.11)
GFR, Est African American: 58 mL/min/{1.73_m2} — ABNORMAL LOW (ref >=60)
GFR, Est Non African American: 50 mL/min/{1.73_m2} — ABNORMAL LOW (ref >=60)
Glucose, Bld: 82 mg/dL (ref 65–99)
Potassium: 4.3 mmol/L (ref 3.5–5.3)
Sodium: 140 mmol/L (ref 135–146)

## 2019-09-13 ENCOUNTER — Telehealth: Payer: Medicare HMO

## 2019-09-15 ENCOUNTER — Other Ambulatory Visit: Payer: Self-pay | Admitting: Internal Medicine

## 2019-09-20 ENCOUNTER — Ambulatory Visit: Payer: Medicare HMO | Admitting: Internal Medicine

## 2019-10-04 ENCOUNTER — Ambulatory Visit (INDEPENDENT_AMBULATORY_CARE_PROVIDER_SITE_OTHER): Payer: Medicare HMO | Admitting: *Deleted

## 2019-10-04 DIAGNOSIS — I509 Heart failure, unspecified: Secondary | ICD-10-CM

## 2019-10-04 DIAGNOSIS — R55 Syncope and collapse: Secondary | ICD-10-CM

## 2019-10-04 DIAGNOSIS — K4090 Unilateral inguinal hernia, without obstruction or gangrene, not specified as recurrent: Secondary | ICD-10-CM

## 2019-10-04 NOTE — Chronic Care Management (AMB) (Signed)
Chronic Care Management   Initial Visit Note  10/04/2019 Name: Anthony Mosley MRN: [id number] DOB: 04-08-1935  Referred by: Arnetha Courser, MD Reason for referral : Chronic Care Management (Caregiver needs; CHF, HLD, Hernia repair/symptoms)   Anthony Mosley is a 84 y.o. year old male who is a primary care patient of Lada, Satira Anis, MD. The CCM team was consulted for assistance with chronic disease management and care coordination needs related to CHF, HLD and hernia symptoms  Review of patient status, including review of consultants reports, relevant laboratory and other test results, and collaboration with appropriate care team members and the patient's provider was performed as part of comprehensive patient evaluation and provision of chronic care management services.    SDOH (Social Determinants of Health) screening performed today: Advanced Directives. See Care Plan for related entries.   Medications: Outpatient Encounter Medications as of 10/04/2019  Medication Sig Note  . B COMPLEX VITAMINS PO Take 1 tablet by mouth daily.    . Carboxymethylcellul-Glycerin (LUBRICATING EYE DROPS OP) Apply 1 drop to eye as needed (dry eyes).    . furosemide (LASIX) 20 MG tablet TAKE 1 TABLET TWICE DAILY 10/04/2019: Taking differently; taking once daily  . Guaifenesin (MUCINEX MAXIMUM STRENGTH) 1200 MG TB12 Take 1,200 mg by mouth at bedtime. 10/04/2019: Takes qhs for congestion  . hydrocortisone 2.5 % cream    . ibuprofen (ADVIL,MOTRIN) 600 MG tablet Take 1 tablet (600 mg total) by mouth every 8 (eight) hours as needed for fever or mild pain. 10/04/2019: Uses prn  . simvastatin (ZOCOR) 40 MG tablet TAKE 1 TABLET EVERY DAY  AT  6PM (PLEASE KEEP UPCOMING APPOINTMENT FOR FURTHER REFILLS)   . diclofenac sodium (VOLTAREN) 1 % GEL Apply 4 g topically 4 (four) times daily. To the knee if needed (Patient not taking: Reported on 10/04/2019)   . docusate sodium (COLACE) 250 MG capsule Take 250 mg by  mouth at bedtime as needed for constipation.   . metoprolol tartrate (LOPRESSOR) 25 MG tablet Take 0.5 tablets (12.5 mg total) by mouth 2 (two) times daily. 10/04/2019: Taking as prescribed; needs refill auth   No facility-administered encounter medications on file as of 10/04/2019.      Objective:  BP Readings from Last 3 Encounters:  08/13/19 122/76  07/30/19 (!) 102/58  06/22/19 128/80   Pulse Readings from Last 3 Encounters:  08/13/19 (!) 51  06/22/19 68  03/05/19 (!) 48   Lab Results  Component Value Date   CREATININE 1.33 (H) 09/03/2019   BUN 21 09/03/2019   NA 140 09/03/2019   K 4.3 09/03/2019   CL 103 09/03/2019   CO2 30 09/03/2019      Goals Addressed            This Visit's Progress   . "I try to keep up with my blood pressure and my weight" (pt-stated)       Current Barriers:  . Chronic Disease Management support, education, and care coordination needs related to CHF and HLD  Clinical Goal(s) related to CHF and HLD:  Over the next 90 days, patient will:  . Work with the care management team to address educational, disease management, and care coordination needs  . Begin self health monitoring activities as directed today - currently monitoring bp (120's/60-70), pulse oximetry (*note patient report of HR sometimes in "upper 40's to 50"; reports rare intermittent episodes of "feeling woozy") and weight (reports 180-182 lbs) approximately once weekly, not recording . Call provider office  for new or worsened signs and symptoms . Call care management team with questions or concerns . Verbalize basic understanding of patient centered plan of care established today  Interventions related to CHF and HLD:  . Evaluation of current treatment plans and patient's adherence to plan as established by provider . Assessed patient understanding of disease states . Assessed patient's education and care coordination needs . Provided basic disease specific education to  patient - encouraged patient to check and record bp, wt twice weekly and anticipate review when [facility] returns call in November . Collaborated with appropriate clinical care team members regarding patient needs  Patient Self Care Activities related to CHF and HLD:  . Patient is unable to independently self-manage chronic health conditions  Initial goal documentation     . "I'm not sure about this burning where I had my hernia surgery. I would like to check with my hernia doctor" (pt-stated)       Current Barriers:  [name] Knowledge Deficits related to appropriate follow up for new symptoms related to previous hernia repair; patient reports mild intermittent "burning" at site of previous right inguinal hernia repair from 2/20 and new discomfort on left; patient was seen in follow up with Dr. Hampton Abbot on 03/05/19   Nurse Case Manager Clinical Goal(s):  [name] Over the next 30 days, patient will attend all scheduled medical appointments: call to office of Dr. Hampton Abbot made on behalf of patient to report symptoms and request consideration for appointment; patient notified of appointment scheduled Tuesday 11/3//20 10am   Interventions:  . Evaluation of current treatment plan related to inguinal hernia symptoms and patient's adherence to plan as established by provider. Nash Dimmer with general surgery office regarding symptoms and appointment  Patient Self Care Activities:  . Self administers medications as prescribed . Attends all scheduled provider appointments . Calls pharmacy for medication refills . Performs ADL's independently . Performs IADL's independently . Unable to independently coordinate needs re: hernia symptoms  Initial goal documentation     . Caregiver Stress       Current Barriers:  . Lacks caregiver support. 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 Evaluation of current treatment plan related to caregiver stress and patient's adherence to plan as established by provider. . Discussed plans with patient for ongoing care management follow up and provided patient with direct contact information for care management team  Patient Self Care Activities:  . Patient reports that current caregiver situation is very helpful to him in providing care for spouse and allowing him opportunity for his own health appointments . Patient reports currently able to perform all self care activities  Initial goal documentation     . COMPLETED: DIET - INCREASE WATER INTAKE       Recommend to drink at least 6-8 8oz glasses of water per day.    . Patient Stated          Anthony Mosley was given information about Chronic Care Management services today including:  1. CCM service includes personalized support from designated clinical staff supervised by his physician, including individualized plan of care and coordination with other care providers 2. 24/7 contact phone numbers  for assistance for urgent and routine care needs. 3. Service will only be billed when office clinical staff spend 20 minutes or more in a month to coordinate care. 4. Only one practitioner may furnish and bill the service in a calendar month. 5. The patient may stop CCM services at any time (effective at the end of the month) by phone call to the office staff. 6. The patient will be responsible for cost sharing (co-pay) of up to 20% of the service fee (after annual deductible is met).  Patient agreed to services and verbal consent obtained.   Plan:   The care management team will reach out to the patient again over the next 30 days.   [facility] / [place] Management  [phone]

## 2019-10-04 NOTE — Patient Instructions (Signed)
Visit Information  Goals Addressed            This Visit's Progress   . "I try to keep up with my blood pressure and my weight" (pt-stated)       Current Barriers:  . Chronic Disease Management support, education, and care coordination needs related to CHF and HLD  Clinical Goal(s) related to CHF and HLD:  Over the next 90 days, patient will:  . Work with the care management team to address educational, disease management, and care coordination needs  . Begin self health monitoring activities as directed today - currently monitoring bp (120's/60-70), pulse oximetry (*note patient report of HR sometimes in "upper 40's to 50"; reports rare intermittent episodes of "feeling woozy") and weight (reports 180-182 lbs) approximately once weekly, not recording . Call provider office for new or worsened signs and symptoms . Call care management team with questions or concerns . Verbalize basic understanding of patient centered plan of care established today  Interventions related to CHF and HLD:  . Evaluation of current treatment plans and patient's adherence to plan as established by provider . Assessed patient understanding of disease states . Assessed patient's education and care coordination needs . Provided basic disease specific education to patient - encouraged patient to check and record bp, wt twice weekly and anticipate review when [facility] returns call in November . Collaborated with appropriate clinical care team members regarding patient needs  Patient Self Care Activities related to CHF and HLD:  . Patient is unable to independently self-manage chronic health conditions  Initial goal documentation     . "I'm not sure about this burning where I had my hernia surgery. I would like to check with my hernia doctor" (pt-stated)       Current Barriers:  [name] Knowledge Deficits related to appropriate follow up for new symptoms related to previous hernia repair; patient reports mild  intermittent "burning" at site of previous right inguinal hernia repair from 2/20 and new discomfort on left; patient was seen in follow up with Dr. Hampton Abbot on 03/05/19   Nurse Case Manager Clinical Goal(s):  [name] Over the next 30 days, patient will attend all scheduled medical appointments: call to office of Dr. Hampton Abbot made on behalf of patient to report symptoms and request consideration for appointment; patient notified of appointment scheduled Tuesday 11/3//20 10am   Interventions:  . Evaluation of current treatment plan related to inguinal hernia symptoms and patient's adherence to plan as established by provider. Nash Dimmer with general surgery office regarding symptoms and appointment  Patient Self Care Activities:  . Self administers medications as prescribed . Attends all scheduled provider appointments . Calls pharmacy for medication refills . Performs ADL's independently . Performs IADL's independently . Unable to independently coordinate needs re: hernia symptoms  Initial goal documentation     . Caregiver Stress       Current Barriers:  . Lacks caregiver support. 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 Evaluation of current treatment plan related to caregiver stress and patient's adherence to plan as established by provider. . Discussed plans with patient for ongoing care management follow up and provided patient with direct  contact information for care management team  Patient Self Care Activities:  . Patient reports that current caregiver situation is very helpful to him in providing care for spouse and allowing him opportunity for his own  health appointments . Patient reports currently able to perform all self care activities  Initial goal documentation     . COMPLETED: DIET - INCREASE WATER INTAKE       Recommend to drink at least 6-8 8oz glasses of water per day.    . Patient Stated         Anthony Mosley was given information about Chronic Care Management services today including:  1. CCM service includes personalized support from designated clinical staff supervised by his physician, including individualized plan of care and coordination with other care providers 2. 24/7 contact phone numbers for assistance for urgent and routine care needs. 3. Service will only be billed when office clinical staff spend 20 minutes or more in a month to coordinate care. 4. Only one practitioner may furnish and bill the service in a calendar month. 5. The patient may stop CCM services at any time (effective at the end of the month) by phone call to the office staff. 6. The patient will be responsible for cost sharing (co-pay) of up to 20% of the service fee (after annual deductible is met).  Patient agreed to services and verbal consent obtained.   The patient verbalized understanding of instructions provided today and declined a print copy of patient instruction materials.   The care management team will reach out to the patient again over the next 30 days.   [facility] / [place] Management  [phone]
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
## 2019-10-12 ENCOUNTER — Ambulatory Visit: Payer: Medicare HMO | Admitting: Surgery

## 2019-10-12 ENCOUNTER — Ambulatory Visit (INDEPENDENT_AMBULATORY_CARE_PROVIDER_SITE_OTHER): Payer: Medicare HMO | Admitting: Physician Assistant

## 2019-10-12 ENCOUNTER — Other Ambulatory Visit: Payer: Self-pay

## 2019-10-12 ENCOUNTER — Encounter: Payer: Self-pay | Admitting: Physician Assistant

## 2019-10-12 VITALS — BP 96/60 | HR 56 | Ht 73.0 in | Wt 189.5 lb

## 2019-10-12 DIAGNOSIS — I2510 Atherosclerotic heart disease of native coronary artery without angina pectoris: Secondary | ICD-10-CM | POA: Diagnosis not present

## 2019-10-12 DIAGNOSIS — I10 Essential (primary) hypertension: Secondary | ICD-10-CM | POA: Diagnosis not present

## 2019-10-12 DIAGNOSIS — I5022 Chronic systolic (congestive) heart failure: Secondary | ICD-10-CM

## 2019-10-12 DIAGNOSIS — E782 Mixed hyperlipidemia: Secondary | ICD-10-CM

## 2019-10-12 DIAGNOSIS — D649 Anemia, unspecified: Secondary | ICD-10-CM | POA: Diagnosis not present

## 2019-10-12 DIAGNOSIS — I351 Nonrheumatic aortic (valve) insufficiency: Secondary | ICD-10-CM

## 2019-10-12 DIAGNOSIS — I712 Thoracic aortic aneurysm, without rupture, unspecified: Secondary | ICD-10-CM

## 2019-10-12 DIAGNOSIS — I480 Paroxysmal atrial fibrillation: Secondary | ICD-10-CM | POA: Diagnosis not present

## 2019-10-12 DIAGNOSIS — I428 Other cardiomyopathies: Secondary | ICD-10-CM | POA: Diagnosis not present

## 2019-10-12 DIAGNOSIS — E611 Iron deficiency: Secondary | ICD-10-CM | POA: Diagnosis not present

## 2019-10-12 NOTE — Patient Instructions (Signed)
Medication Instructions:  1- STOP Metoprolol *If you need a refill on your cardiac medications before your next appointment, please call your pharmacy*  Lab Work: Your physician recommends that you have lab work today(BMET, CBC, TSH)  If you have labs (blood work) drawn today and your tests are completely normal, you will receive your results only by: [name] MyChart Message (if you have MyChart) OR . A paper copy in the mail If you have any lab test that is abnormal or we need to change your treatment, we will call you to review the results.  Testing/Procedures: 1- Echo  Please return to [facility] on ______________ at _______________ AM/PM for an Echocardiogram. Your physician has requested that you have an echocardiogram. Echocardiography is a painless test that uses sound waves to create images of your heart. It provides your doctor with information about the size and shape of your heart and how well your heart's chambers and valves are working. This procedure takes approximately one hour. There are no restrictions for this procedure. Please note; depending on visual quality an IV may need to be placed.    Follow-Up: At [facility], you and your health needs are our priority.  As part of our continuing mission to provide you with exceptional heart care, we have created designated Provider Care Teams.  These Care Teams include your primary Cardiologist (physician) and Advanced Practice Providers (APPs -  Physician Assistants and Nurse Practitioners) who all work together to provide you with the care you need, when you need it.  Your next appointment:   1 month  The format for your next appointment:   In Person  Provider:    You may see [name], MD or one of the following Advanced Practice Providers on your designated Care Team:    Murray Hodgkins, NP  Christell Faith, PA-C  Marrianne Mood, PA-C   Other Instructions

## 2019-10-13 ENCOUNTER — Telehealth: Payer: Self-pay | Admitting: *Deleted

## 2019-10-13 LAB — BASIC METABOLIC PANEL
BUN/Creatinine Ratio: 17 (ref 10–24)
BUN: 22 mg/dL (ref 8–27)
CO2: 27 mmol/L (ref 20–29)
Calcium: 9 mg/dL (ref 8.6–10.2)
Chloride: 99 mmol/L (ref 96–106)
Creatinine, Ser: 1.31 mg/dL — ABNORMAL HIGH (ref 0.76–1.27)
GFR calc Af Amer: 59 mL/min/{1.73_m2} — ABNORMAL LOW (ref >59)
GFR calc non Af Amer: 51 mL/min/{1.73_m2} — ABNORMAL LOW (ref >59)
Glucose: 122 mg/dL — ABNORMAL HIGH (ref 65–99)
Potassium: 4.2 mmol/L (ref 3.5–5.2)
Sodium: 141 mmol/L (ref 134–144)

## 2019-10-13 LAB — CBC
Hematocrit: 40.7 % (ref 37.5–51.0)
Hemoglobin: 13.7 g/dL (ref 13.0–17.7)
MCH: 31.3 pg (ref 26.6–33.0)
MCHC: 33.7 g/dL (ref 31.5–35.7)
MCV: 93 fL (ref 79–97)
Platelets: 180 10*3/uL (ref 150–450)
RBC: 4.38 x10E6/uL (ref 4.14–5.80)
RDW: 12.7 % (ref 11.6–15.4)
WBC: 6 10*3/uL (ref 3.4–10.8)

## 2019-10-13 LAB — TSH: TSH: 3.39 u[IU]/mL (ref 0.450–4.500)

## 2019-10-13 MED ORDER — FUROSEMIDE 20 MG PO TABS: 20.0000 mg | ORAL_TABLET | Freq: Every day | ORAL | 1 refills | Status: DC

## 2019-10-13 NOTE — Telephone Encounter (Signed)
OK. Thanks for the update. He told me at our visit he was still taking 20 mg bid. Given he is a little dehydrated, he should take Lasix 20 mg prn for weight gain > 3 pounds overnight, > 5 pounds in a week, leg swelling, or increased SOB.

## 2019-10-13 NOTE — Telephone Encounter (Signed)
-----   Message from Rise Mu, PA-C sent at 10/13/2019  8:25 AM EDT ----- Renal function remains mildly elevated, though consistent with prior readings.  Potassium is at goal.  Blood count is normal.  Await TSH.   -He is running on the dry side, please decrease Lasix to 20 mg daily with an extra 20 mg prn weight gain > 3 pounds overnight, >5 pounds in a week, increased SOB, or lower extremity swelling.

## 2019-10-13 NOTE — Telephone Encounter (Signed)
TSH normal as well.   Results called to pt. Pt verbalized understanding. Patient states he's already been only taking furosemide 20 mg once a day for several months.  He says he was running to the bathroom so much and his swelling got better so he stopped taking the second pill each day. Advised patient I would update Thurmond Butts and let him know if there are any further instructions.

## 2019-10-14 MED ORDER — FUROSEMIDE 20 MG PO TABS: 20.0000 mg | ORAL_TABLET | Freq: Every day | ORAL | 1 refills | Status: DC | PRN

## 2019-10-14 NOTE — Telephone Encounter (Signed)
Called patient and he verbalized understanding to take the furosemide 20 mg as needed for the reasons listed. He was appreciative. Med list updated.

## 2019-10-19 ENCOUNTER — Ambulatory Visit: Payer: Medicare HMO | Admitting: Surgery

## 2019-10-19 ENCOUNTER — Encounter: Payer: Self-pay | Admitting: Surgery

## 2019-10-19 ENCOUNTER — Other Ambulatory Visit: Payer: Self-pay

## 2019-10-19 ENCOUNTER — Telehealth: Payer: Self-pay | Admitting: Family Medicine

## 2019-10-19 VITALS — BP 111/68 | HR 66 | Temp 97.5°F | Ht 73.0 in | Wt 194.0 lb

## 2019-10-19 DIAGNOSIS — Z09 Encounter for follow-up examination after completed treatment for conditions other than malignant neoplasm: Secondary | ICD-10-CM | POA: Diagnosis not present

## 2019-10-19 DIAGNOSIS — K4090 Unilateral inguinal hernia, without obstruction or gangrene, not specified as recurrent: Secondary | ICD-10-CM | POA: Diagnosis not present

## 2019-10-19 NOTE — Progress Notes (Signed)
10/19/2019  History of Present Illness: Anthony Mosley is a 84 y.o. male s/p open right inguinal hernia repair with mesh on 2/20.  He presents because he has been having some sporadic right groin discomfort.  He describes it as burning sensation with some mild degree of pain.  Rates it as 1-2 out of 10 scale.  It is not constant.  It does not affect his daily activities, but he wanted to make sure that the work he does is not going to make it worse.  He denies any bulging sensation or saying that the area is getting swollen.  Past Medical History: Past Medical History:  Diagnosis Date  . Arthritis   . [place] of lung ([place])    a. seen on CT.  [name] CAD (coronary artery disease)    a.  Elective R/LHC 06/12/18 showed mild-moderate nonobstructive RCA CAD with mild LAD myocardial bridging but no significant obstructive CAD; there was mildly elevated L heart filling pressure, moderately elevated R heart filling pressure, and mild pulm HTN.  . Cancer ([place])    SKIN  . Chronic knee pain   . Cough   . Edema    FEET/LEGS  . Former tobacco use   . Hemorrhoids   . Hyperbilirubinemia   . Hyperlipidemia   . Mild aortic insufficiency   . Mild pulmonary hypertension ([place])   . Moderate mitral regurgitation   . NICM (nonischemic cardiomyopathy) ([place])    a. EF 40-45% by echo 05/2017.  [name] Pleural effusion, left 11/2018  . Pneumonia   . PONV (postoperative nausea and vomiting)    after first cataract  . Rosacea   . Ruptured spleen   . Thoracic aortic aneurysm ([place])    a. 4.4cm by CT 12/2017, imaged incompletely at 4.3cm in 10/2018 CT.  [name] Wears dentures    partial upper  . Wheezing      Past Surgical History: Past Surgical History:  Procedure Laterality Date  . APPENDECTOMY     Dr Emilio Math  . CARDIAC CATHETERIZATION  2005  . CARDIAC CATHETERIZATION  2002  . CATARACT EXTRACTION W/PHACO Right 10/24/2015   Procedure: CATARACT EXTRACTION PHACO AND INTRAOCULAR LENS PLACEMENT (IOC);  Surgeon: [name], MD;  Location: [facility];  Service: Ophthalmology;  Laterality: Right;  [place] 00:52 AP% 23.5 CDE 12.35 fluid pack lot # [id number]  . CATARACT EXTRACTION W/PHACO Left 11/14/2015   Procedure: CATARACT EXTRACTION PHACO AND INTRAOCULAR LENS PLACEMENT (IOC);  Surgeon: [name], MD;  Location: [facility];  Service: Ophthalmology;  Laterality: Left;  [place] 00:55 AP% 19.1 CDE 10.66 fluid pack lot # [id number]  . COLONOSCOPY  1987  . COLONOSCOPY WITH PROPOFOL N/A 08/22/2017   Procedure: COLONOSCOPY WITH PROPOFOL;  Surgeon: [name], MD;  Location: [place];  Service: Gastroenterology;  Laterality: N/A;  . EYE SURGERY    . HERNIA REPAIR     umbilical/ Dr Emilio Math  . INGUINAL HERNIA REPAIR Right 02/04/2019   Procedure: HERNIA REPAIR INGUINAL WITH MESH;  Surgeon: Olean Ree, MD;  Location: [facility];  Service: General;  Laterality: Right;  . IR ANGIOGRAM VISCERAL SELECTIVE  10/29/2018  . IR EMBO ART  VEN HEMORR LYMPH EXTRAV  INC GUIDE ROADMAPPING  10/29/2018  . IR THORACENTESIS ASP PLEURAL SPACE W/IMG GUIDE  11/23/2018  . IR US GUIDE VASC ACCESS RIGHT  10/29/2018  . KNEE ARTHROSCOPY Bilateral   . POLYPECTOMY  08/22/2017   Procedure: POLYPECTOMY;  Surgeon: [name], MD;  Location: [place];  Service:  Gastroenterology;;  . RIGHT/LEFT HEART CATH AND CORONARY ANGIOGRAPHY N/A 06/13/2017   Procedure: Right/Left Heart Cath and Coronary Angiography;  Surgeon: [name], MD;  Location: [facility];  Service: Cardiovascular;  Laterality: N/A;  . TONSILLECTOMY      Home Medications: Prior to Admission medications   Medication Sig Start Date End Date Taking? Authorizing Provider  B COMPLEX VITAMINS PO Take 1 tablet by mouth daily.    Yes [provider]  Carboxymethylcellul-Glycerin (LUBRICATING EYE DROPS OP) Apply 1 drop to eye as needed (dry eyes).    Yes [provider]  diclofenac sodium (VOLTAREN) 1 % GEL Apply 4 g topically 4 (four) times  daily. To the knee if needed 05/12/18  Yes Lada, Satira Anis, MD  docusate sodium (COLACE) 250 MG capsule Take 250 mg by mouth at bedtime as needed for constipation.   Yes [provider]  ferrous sulfate 325 (65 FE) MG EC tablet Take 325 mg by mouth daily with breakfast.   Yes [provider]  furosemide (LASIX) 20 MG tablet Take 1 tablet (20 mg total) by mouth daily as needed for edema (for weight gain greater than 3 lbs overnight, 5 lbs in a week, for swelling or increased shortness of breath.). 10/14/19  Yes Dunn, Ryan M, PA-C  Guaifenesin [employer] MAXIMUM STRENGTH) 1200 MG TB12 Take 1,200 mg by mouth at bedtime.   Yes [provider]  hydrocortisone 2.5 % cream  04/13/19  Yes [provider]  simvastatin (ZOCOR) 40 MG tablet TAKE 1 TABLET EVERY DAY  AT  6PM (PLEASE KEEP UPCOMING APPOINTMENT FOR FURTHER REFILLS) 09/16/19  Yes End, Harrell Gave, MD    Allergies: Allergies  Allergen Reactions  . Macrolides And Ketolides Other (See Comments)    Prolonged Q-T syndrome  . Norvasc [Amlodipine] Other (See Comments)    Bradycardia  . Avodart [Dutasteride] Itching  . Tape Rash    Review of Systems: Review of Systems  Constitutional: Negative for chills and fever.  Respiratory: Negative for shortness of breath.   Cardiovascular: Negative for chest pain.  Gastrointestinal: Negative for abdominal pain, nausea and vomiting.    Physical Exam BP 111/68   Pulse 66   Temp (!) 97.5 F (36.4 C) (Temporal)   Ht 6\' 1"  (1.854 m)   Wt 194 lb (88 kg)   SpO2 96%   BMI 25.60 kg/m  CONSTITUTIONAL: No acute distress HEENT:  Normocephalic, atraumatic, extraocular motion intact. RESPIRATORY:  Lungs are clear, and breath sounds are equal bilaterally. Normal respiratory effort without pathologic use of accessory muscles. CARDIOVASCULAR: Heart is regular without murmurs, gallops, or rubs. GI: The abdomen is soft, nondistended, nontender to palpation.  Right groin incision  is well-healed with no evidence of recurrent hernia with straining or coughing.  There is no palpable mass or scar tissue. NEUROLOGIC:  Motor and sensation is grossly normal.  Cranial nerves are grossly intact. PSYCH:  Alert and oriented to person, place and time. Affect is normal.  Labs/Imaging: None  Assessment and Plan: This is a 84 y.o. male status post open right inguinal hernia repair on 2/20, now with some mild right groin discomfort.  -Discussed with the patient that the burning sensation he is having could be related to some irritation of the ilioinguinal nerve going along the groin that may be in contact with the portion of the mesh.  At this point the degree of discomfort is very minimal and there is no acute surgical need.  The patient denies any  significant pain reports that he just wanted to make sure everything was okay.  Reassured the patient that he may continue his usual activities as there is no evidence of a hernia recurrence.  However if he notices that the pain or discomfort starting to get worse or more frequent, to let us know. -Follow-up as needed.  Face-to-face time spent with the patient and care providers was 15 minutes, with more than 50% of the time spent counseling, educating, and coordinating care of the patient.     Melvyn Neth, [facility]

## 2019-10-19 NOTE — Patient Instructions (Addendum)
If your pain or discomfort increases, please give our office a call.   Please call and ask to speak with a nurse if you develop questions or concerns.

## 2019-10-20 NOTE — Telephone Encounter (Signed)
FMLA for patient's son to assist patient in event of CHF exacerbation is completed.

## 2019-10-27 ENCOUNTER — Other Ambulatory Visit: Payer: Self-pay

## 2019-10-27 ENCOUNTER — Ambulatory Visit: Payer: Medicare HMO

## 2019-10-27 DIAGNOSIS — I509 Heart failure, unspecified: Secondary | ICD-10-CM

## 2019-10-27 NOTE — Chronic Care Management (AMB) (Signed)
Chronic Care Management   Follow Up Note   10/27/2019 Name: Anthony Mosley MRN: [id number] DOB: 09-23-1935  Primary Care Provider: [name]  Anthony Mosley is a 84 y.o. year old male. The CCM team was consulted for assistance with chronic disease management and care coordination needs.  Review of patient status, including review of consultants reports, relevant laboratory and other test results, and collaboration with appropriate care team members and the patient's provider was performed as part of comprehensive patient evaluation and provision of chronic care management services.      Outpatient Encounter Medications as of 10/27/2019  Medication Sig Note  . B COMPLEX VITAMINS PO Take 1 tablet by mouth daily.    . Carboxymethylcellul-Glycerin (LUBRICATING EYE DROPS OP) Apply 1 drop to eye as needed (dry eyes).    [name] diclofenac sodium (VOLTAREN) 1 % GEL Apply 4 g topically 4 (four) times daily. To the knee if needed   . docusate sodium (COLACE) 250 MG capsule Take 250 mg by mouth at bedtime as needed for constipation.   . ferrous sulfate 325 (65 FE) MG EC tablet Take 325 mg by mouth daily with breakfast.   . furosemide (LASIX) 20 MG tablet Take 1 tablet (20 mg total) by mouth daily as needed for edema (for weight gain greater than 3 lbs overnight, 5 lbs in a week, for swelling or increased shortness of breath.).   [name] Guaifenesin (MUCINEX MAXIMUM STRENGTH) 1200 MG TB12 Take 1,200 mg by mouth at bedtime. 10/04/2019: Takes qhs for congestion  . hydrocortisone 2.5 % cream    . simvastatin (ZOCOR) 40 MG tablet TAKE 1 TABLET EVERY DAY  AT  6PM (PLEASE KEEP UPCOMING APPOINTMENT FOR FURTHER REFILLS)    No facility-administered encounter medications on file as of 10/27/2019.      Goals Addressed            This Visit's Progress   . "I try to keep up with my blood pressure and my weight" (pt-stated)   On track    Current Barriers:  . Chronic Disease Management support,  education, and care coordination needs related to CHF and HLD  Clinical Goal(s) related to CHF and HLD:  Over the next 90 days, patient will:  . Work with the care management team to address educational, disease management, and care coordination needs  . Begin self health monitoring activities as directed today - currently monitoring bp (120's/60-70), pulse oximetry (*note patient report of HR sometimes in "upper 40's to 50"; reports rare intermittent episodes of "feeling woozy") and weight (reports 180-182 lbs) approximately once weekly, not recording . Call provider office for new or worsened signs and symptoms . Call care management team with questions or concerns . Verbalize basic understanding of patient centered plan of care established today  Interventions related to CHF and HLD:  . Evaluation of current treatment plans and patient's adherence to plan as established by provider . Patient encouraged to continue BP monitoring and take medications as prescribed.  . Reviewed BP and pulse ranges for the past two weeks. He reports SBP ranges from 100-114. Reports diastolic readings in the 0000000. Reports pulse ranges in the 50's-70's.  . Encouraged to continue weighing at least twice a week and record readings. Discussed CHF weight parameters and indications for notifying MD. Reports a weight of 184lbs today. . Reviewed s/sx of complications related to CHF along with indications for seeking immediate medical attention. . Discussed activity tolerance and provided education regarding safety precautions. Patient  encouraged to practice safety measures and avoid activities that could potentially cause overexertion.   Patient Self Care Activities related to CHF and HLD:  . Patient requires assistance with self-managing chronic health conditions . Patient takes medications as prescribed . Patient attends medical appointments as scheduled  Please see past updates related to this goal by clicking on  the "Past Updates" button in the selected goal      . COMPLETED: "I'm not sure about this burning where I had my hernia surgery. I would like to check with my hernia doctor" (pt-stated)       Current Barriers:  [name] Knowledge Deficits related to appropriate follow up for new symptoms related to previous hernia repair; patient reports mild intermittent "burning" at site of previous right inguinal hernia repair from 2/20 and new discomfort on left; patient was seen in follow up with Dr. Hampton Abbot on 03/05/19   Nurse Case Manager Clinical Goal(s):  [name] Over the next 30 days, patient will attend all scheduled medical appointments: call to office of Dr. Hampton Abbot made on behalf of patient to report symptoms and request consideration for appointment; patient notified of appointment scheduled Tuesday 11/3//20 10am   Interventions:  . Evaluation of current treatment plan related to inguinal hernia symptoms and patient's adherence to plan as established by provider. Nash Dimmer with general surgery office regarding symptoms and appointment  Patient Self Care Activities:  . Self administers medications as prescribed . Attends all scheduled provider appointments . Calls pharmacy for medication refills . Performs ADL's independently . Performs IADL's independently . Unable to independently coordinate needs re: hernia symptoms  Please see past updates related to this goal by clicking on the "Past Updates" button in the selected goal      . Caregiver Stress   On track    Current Barriers:  . Lacks caregiver support. 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 Evaluation of current  treatment plan related to caregiver stress and patient's adherence to plan as established by provider. . Discussed plans with patient for ongoing care management follow up and provided patient with direct contact information for care management team . Discussed current plan and updated regarding in-home assistance for his spouse. He reports being very pleased with the current caregiver. Reports his wife's mental status has not declined since the initial outreach, but she will require an evaluation/follow-up regarding skin integrity. Reports she is scheduled for a visit with her primary care provider on 11/01/19.   Patient Self Care Activities:  . Patient reports that current caregiver situation is very helpful to him in providing care for spouse and allowing him opportunity for his own health appointments . Patient reports currently able to perform all self care activities  Please see past updates related to this goal by clicking on the "Past Updates" button in the selected goal           Follow-Up Plan The CCM Case Manager will follow up with Anthony Mosley next month. Contact information for the care management team was provided. He was encouraged to call with any questions or concerns if needed prior to the scheduled outreach.   [facility]  Care Management [phone]

## 2019-10-27 NOTE — Patient Instructions (Addendum)
Goals Addressed            This Visit's Progress   . "I try to keep up with my blood pressure and my weight" (pt-stated)   On track    Current Barriers:  . Chronic Disease Management support, education, and care coordination needs related to CHF and HLD  Clinical Goal(s) related to CHF and HLD:  Over the next 90 days, patient will:  . Work with the care management team to address educational, disease management, and care coordination needs  . Begin self health monitoring activities as directed today - currently monitoring bp (120's/60-70), pulse oximetry (*note patient report of HR sometimes in "upper 40's to 50"; reports rare intermittent episodes of "feeling woozy") and weight (reports 180-182 lbs) approximately once weekly, not recording . Call provider office for new or worsened signs and symptoms . Call care management team with questions or concerns . Verbalize basic understanding of patient centered plan of care established today  Interventions related to CHF and HLD:  . Evaluation of current treatment plans and patient's adherence to plan as established by provider . Patient encouraged to continue BP monitoring and take medications as prescribed.  . Reviewed BP and pulse ranges for the past two weeks. He reports SBP ranges from 100-114. Reports diastolic readings in the 0000000. Reports pulse ranges in the 50's-70's.  . Encouraged to continue weighing at least twice a week and record readings. Discussed CHF weight parameters and indications for notifying MD. Reports a weight of 184lbs today. . Reviewed s/sx of complications related to CHF along with indications for seeking immediate medical attention. . Discussed activity tolerance and provided education regarding safety precautions. Patient encouraged to practice safety measures and avoid activities that could potentially cause overexertion.   Patient Self Care Activities related to CHF and HLD:  . Patient requires assistance with  self-managing chronic health conditions . Patient takes medications as prescribed . Patient attends medical appointments as scheduled  Please see past updates related to this goal by clicking on the "Past Updates" button in the selected goal      . COMPLETED: "I'm not sure about this burning where I had my hernia surgery. I would like to check with my hernia doctor" (pt-stated)       Current Barriers:  [name] Knowledge Deficits related to appropriate follow up for new symptoms related to previous hernia repair; patient reports mild intermittent "burning" at site of previous right inguinal hernia repair from 2/20 and new discomfort on left; patient was seen in follow up with Dr. Hampton Abbot on 03/05/19   Nurse Case Manager Clinical Goal(s):  [name] Over the next 30 days, patient will attend all scheduled medical appointments: call to office of Dr. Hampton Abbot made on behalf of patient to report symptoms and request consideration for appointment; patient notified of appointment scheduled Tuesday 11/3//20 10am   Interventions:  . Evaluation of current treatment plan related to inguinal hernia symptoms and patient's adherence to plan as established by provider. Nash Dimmer with general surgery office regarding symptoms and appointment  Patient Self Care Activities:  . Self administers medications as prescribed . Attends all scheduled provider appointments . Calls pharmacy for medication refills . Performs ADL's independently . Performs IADL's independently . Unable to independently coordinate needs re: hernia symptoms  Please see past updates related to this goal by clicking on the "Past Updates" button in the selected goal      . Caregiver Stress   On track    Current  Barriers:  . Lacks caregiver support. 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 Evaluation of current treatment plan related to caregiver stress and patient's adherence to plan as established by provider. . Discussed plans with patient for ongoing care management follow up and provided patient with direct contact information for care management team . Discussed current plan and updated regarding in-home assistance for his spouse. He reports being very pleased with the current caregiver. Reports his wife's mental status has not declined since the initial outreach, but she will require an evaluation/follow-up regarding skin integrity. Reports she is scheduled for a visit with her primary care provider on 11/01/19.   Patient Self Care Activities:  . Patient reports that current caregiver situation is very helpful to him in providing care for spouse and allowing him opportunity for his own health appointments . Patient reports currently able to perform all self care activities  Please see past updates related to this goal by clicking on the "Past Updates" button in the selected goal         Mr. Rafique verbalized understanding of the instructions discussed today. He did not request a copy of the instructions.  A telephonic follow-up has been scheduled for 11/24/19.    [facility] Care Management [phone]

## 2019-11-23 ENCOUNTER — Other Ambulatory Visit: Payer: Self-pay

## 2019-11-23 ENCOUNTER — Ambulatory Visit (INDEPENDENT_AMBULATORY_CARE_PROVIDER_SITE_OTHER): Payer: Medicare HMO

## 2019-11-23 DIAGNOSIS — I351 Nonrheumatic aortic (valve) insufficiency: Secondary | ICD-10-CM | POA: Diagnosis not present

## 2019-11-24 ENCOUNTER — Telehealth: Payer: Self-pay

## 2019-11-24 ENCOUNTER — Ambulatory Visit: Payer: Self-pay

## 2019-11-24 ENCOUNTER — Telehealth: Payer: Medicare HMO

## 2019-11-24 NOTE — Chronic Care Management (AMB) (Signed)
  Chronic Care Management   Outreach Note  11/24/2019 Name: Anthony Mosley MRN: [id number] DOB: [birth year]  Primary Care Provider: Arnetha Courser, MD Reason for referral : Chronic Care Management   An unsuccessful telephone outreach was attempted today. Anthony Mosley is currently engaged with the Chronic Care Management team. A HIPAA compliant voice message was left requesting a return call.  Follow Up Plan The care management team will follow up with Anthony Mosley within the next two to three weeks.    [facility] Care Management [phone]

## 2019-11-24 NOTE — Telephone Encounter (Signed)
Call to patient to discuss echo results.   Pt verbalized understanding and had no further questions at this time. No new orders.   Advised pt to call for any further questions or concerns.

## 2019-11-24 NOTE — Telephone Encounter (Signed)
-----   Message from Rise Mu, PA-C sent at 11/24/2019 11:44 AM EST ----- Echo showed reduced pump function of 45-50% with mild thickening of the heart and a slightly stiffened heart.  Mildly dilated pumping chamber.  Mildly dilated upper left chamber.  Mild to moderate leakiness of the mitral valve.  Moderate leakiness of the tricuspid valve.  Mild leakiness of the aortic valve. Mild leakiness of the pulmonic valve.  Mildly dilated aortic root and ascending aorta.   Recommendations: -Compared to prior echo, his pump function has remained stable.  -His leaky valves are stable to improved.  -His dilated aortic root/ascending aorta are stable.  -Follow up as planned.

## 2019-12-07 ENCOUNTER — Encounter: Payer: Self-pay | Admitting: Nurse Practitioner

## 2019-12-07 ENCOUNTER — Ambulatory Visit (INDEPENDENT_AMBULATORY_CARE_PROVIDER_SITE_OTHER): Payer: Medicare HMO | Admitting: Family Medicine

## 2019-12-07 ENCOUNTER — Other Ambulatory Visit: Payer: Self-pay

## 2019-12-07 VITALS — BP 100/60 | HR 58 | Ht 73.0 in | Wt 195.8 lb

## 2019-12-07 DIAGNOSIS — I480 Paroxysmal atrial fibrillation: Secondary | ICD-10-CM

## 2019-12-07 DIAGNOSIS — I712 Thoracic aortic aneurysm, without rupture, unspecified: Secondary | ICD-10-CM

## 2019-12-07 DIAGNOSIS — I2510 Atherosclerotic heart disease of native coronary artery without angina pectoris: Secondary | ICD-10-CM | POA: Diagnosis not present

## 2019-12-07 DIAGNOSIS — I429 Cardiomyopathy, unspecified: Secondary | ICD-10-CM | POA: Diagnosis not present

## 2019-12-07 NOTE — Patient Instructions (Signed)
Medication Instructions:  Your physician recommends that you continue on your current medications as directed. Please refer to the Current Medication list given to you today.  *If you need a refill on your cardiac medications before your next appointment, please call your pharmacy*  Lab Work: Your physician recommends that you return for lab work in:1 week prior to CT at the medical mall. No appt is needed. Hours are M-F 7AM- 6 PM.  If you have labs (blood work) drawn today and your tests are completely normal, you will receive your results only by: [name] MyChart Message (if you have MyChart) OR . A paper copy in the mail If you have any lab test that is abnormal or we need to change your treatment, we will call you to review the results.  Testing/Procedures: 1- A CT Aorta has been ordered by your provider. Please call imaging to schedule at [phone].  CT Angiography (CTA), is a special type of CT scan that uses a computer to produce multi-dimensional views of major blood vessels throughout the body. In CT angiography, a contrast material is injected through an IV to help visualize the blood vessels  Please arrive at the [facility] at 30-45 minutes prior to test start time.   Please follow these instructions carefully (unless otherwise directed):   On the Night Before the Test: . Be sure to Drink plenty of water. . Do not consume any caffeinated/decaffeinated beverages or chocolate 12 hours prior to your test. . Do not take any antihistamines 12 hours prior to your test. . If you take Metformin do not take 24 hours prior to test.  On the Day of the Test: . Drink plenty of water. Do not drink any water within one hour of the test. . Do not eat any food 4 hours prior to the test. . You may take your regular medications prior to the test.  . HOLD Furosemide/Hydrochlorothiazide morning of the test.        After the Test: . Drink plenty of water. . After  receiving IV contrast, you may experience a mild flushed feeling. This is normal. . On occasion, you may experience a mild rash up to 24 hours after the test. This is not dangerous. If this occurs, you can take Benadryl 25 mg and increase your fluid intake. . If you experience trouble breathing, this can be serious. If it is severe call 911 IMMEDIATELY. If it is mild, please call our office. . If you take any of these medications: Glipizide/Metformin, Avandament, Glucavance, please do not take 48 hours after completing test.  Follow-Up: At [facility], you and your health needs are our priority.  As part of our continuing mission to provide you with exceptional heart care, we have created designated Provider Care Teams.  These Care Teams include your primary Cardiologist (physician) and Advanced Practice Providers (APPs -  Physician Assistants and Nurse Practitioners) who all work together to provide you with the care you need, when you need it.  Your next appointment:   3 month(s)  The format for your next appointment:   In Person  Provider:    You may see [name], MD or Murray Hodgkins, NP.
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
## 2019-12-13 ENCOUNTER — Ambulatory Visit: Payer: Medicare HMO

## 2019-12-13 ENCOUNTER — Other Ambulatory Visit: Payer: Self-pay

## 2019-12-13 DIAGNOSIS — I509 Heart failure, unspecified: Secondary | ICD-10-CM

## 2019-12-13 DIAGNOSIS — E785 Hyperlipidemia, unspecified: Secondary | ICD-10-CM

## 2019-12-13 NOTE — Chronic Care Management (AMB) (Signed)
Chronic Care Management   Follow Up Note   12/13/2019 Name: ARIANNA BETHLEY MRN: [id number] DOB: 19-May-1935  Primary Care Provider: Hubbard Hartshorn, FNP Reason for referral : Chronic Care Management   KEYLOR BONIFIELD is a 84 y.o. year old male who is a primary care patient of Hubbard Hartshorn, FNP. He is currently engaged with the Chronic Care Management team. A routine telephonic outreach was conducted today.  Review of Mr. Mable status, including review of consultants reports, relevant labs and test results was conducted today. Collaboration with appropriate care team members was performed as part of the comprehensive evaluation and provision of chronic care management services.    Outpatient Encounter Medications as of 12/13/2019  Medication Sig Note  . B COMPLEX VITAMINS PO Take 1 tablet by mouth daily.    . Carboxymethylcellul-Glycerin (LUBRICATING EYE DROPS OP) Apply 1 drop to eye as needed (dry eyes).    [name] diclofenac sodium (VOLTAREN) 1 % GEL Apply 4 g topically 4 (four) times daily. To the knee if needed   . docusate sodium (COLACE) 250 MG capsule Take 250 mg by mouth at bedtime as needed for constipation.   . ferrous sulfate 325 (65 FE) MG EC tablet Take 325 mg by mouth daily with breakfast.   . furosemide (LASIX) 20 MG tablet Take 1 tablet (20 mg total) by mouth daily as needed for edema (for weight gain greater than 3 lbs overnight, 5 lbs in a week, for swelling or increased shortness of breath.).   [name] Guaifenesin (MUCINEX MAXIMUM STRENGTH) 1200 MG TB12 Take 1,200 mg by mouth at bedtime. 10/04/2019: Takes qhs for congestion  . hydrocortisone 2.5 % cream    . simvastatin (ZOCOR) 40 MG tablet TAKE 1 TABLET EVERY DAY  AT  6PM (PLEASE KEEP UPCOMING APPOINTMENT FOR FURTHER REFILLS)    No facility-administered encounter medications on file as of 12/13/2019.       Goals Addressed            This Visit's Progress   . "I try to keep up with my blood pressure and my  weight" (pt-stated)       Current Barriers:  . Chronic Disease Management support, education, and care coordination needs related to CHF and HLD   Clinical Goal(s) related to CHF and HLD:  . Over the next 90 days, patient will monitor blood pressure and pulse daily and record readings. . Over the next 90 days, patient will weigh daily and maintain a log.  . Over the next 90 days ,patient will take all medications as prescribed. . Over the next 90 days, patient will attend all provider appointments as scheduled.  . Over the next 90 days, patient will adhere to recommended cardiac prudent/heart healthy diet.   Interventions related to CHF and HLD:  . Reviewed medications. Encouraged to take all medications as prescribed. Encouraged to notify provider if unable to tolerate prescribed regimen. Reports self administering medications without difficulty. . Discussed blood pressure and pulse ranges along with established parameters. Encouraged to monitor daily and maintain a log. Reports systolic readings have ranged in the 120's. Reports diastolic readings in the 0000000. Reports pulse has ranged from the high 50's to 60's.  . Reviewed s/sx of CHF complications along with indications for notifying MD. Encouraged to monitor weight and report weight gain greater than 3 pounds overnight or 5 pounds within a week. Reports weight of 189lbs. . Discussed nutritional intake and importance of adhering to cardiac prudent/heart healthy diet. [name]  Discussed activity tolerance and provided education regarding safety precautions. Patient encouraged to practice safety measures and avoid activities that could potentially cause overexertion.   Patient Self Care Activities related to CHF and HLD:  . Patient requires assistance with self-managing chronic health conditions . Patient takes medications as prescribed . Patient attends medical appointments as scheduled . Performs ADL's independently.  Please see past updates  related to this goal by clicking on the "Past Updates" button in the selected goal          PLAN The care management team will follow up with Mr. Nelli in 3 months. He was encouraged to contact the team for any health related concerns if needed prior to the next outreach.    [facility] Care Management [phone]

## 2019-12-14 ENCOUNTER — Other Ambulatory Visit: Payer: Self-pay

## 2019-12-14 ENCOUNTER — Ambulatory Visit (INDEPENDENT_AMBULATORY_CARE_PROVIDER_SITE_OTHER): Payer: Medicare HMO | Admitting: Family Medicine

## 2019-12-14 ENCOUNTER — Encounter: Payer: Self-pay | Admitting: Family Medicine

## 2019-12-14 DIAGNOSIS — E538 Deficiency of other specified B group vitamins: Secondary | ICD-10-CM

## 2019-12-14 DIAGNOSIS — R7303 Prediabetes: Secondary | ICD-10-CM

## 2019-12-14 DIAGNOSIS — D649 Anemia, unspecified: Secondary | ICD-10-CM

## 2019-12-14 DIAGNOSIS — I429 Cardiomyopathy, unspecified: Secondary | ICD-10-CM

## 2019-12-14 DIAGNOSIS — M170 Bilateral primary osteoarthritis of knee: Secondary | ICD-10-CM

## 2019-12-14 DIAGNOSIS — I712 Thoracic aortic aneurysm, without rupture, unspecified: Secondary | ICD-10-CM

## 2019-12-14 DIAGNOSIS — N183 Chronic kidney disease, stage 3 unspecified: Secondary | ICD-10-CM

## 2019-12-14 DIAGNOSIS — R600 Localized edema: Secondary | ICD-10-CM

## 2019-12-14 DIAGNOSIS — R351 Nocturia: Secondary | ICD-10-CM

## 2019-12-14 DIAGNOSIS — I5020 Unspecified systolic (congestive) heart failure: Secondary | ICD-10-CM

## 2019-12-14 DIAGNOSIS — I480 Paroxysmal atrial fibrillation: Secondary | ICD-10-CM

## 2019-12-14 DIAGNOSIS — I2510 Atherosclerotic heart disease of native coronary artery without angina pectoris: Secondary | ICD-10-CM

## 2019-12-14 DIAGNOSIS — E611 Iron deficiency: Secondary | ICD-10-CM

## 2019-12-14 NOTE — Progress Notes (Signed)
Name: Anthony Mosley   MRN: [id number]    DOB: September 20, 1935   Date:12/14/2019       Progress Note  Subjective  Chief Complaint  Chief Complaint  Patient presents with  . Anemia  . Congestive Heart Failure  . Coronary Artery Disease  . Dyslipidemia  . Prediabetes    I connected with  [name] on 12/14/19 at 11:20 AM EST by telephone and verified that I am speaking with the correct person using two identifiers.  I discussed the limitations, risks, security and privacy concerns of performing an evaluation and management service by telephone and the availability of in person appointments. Staff also discussed with the patient that there may be a patient responsible charge related to this service. Patient Location: Home Provider Location: Office Additional Individuals present: None  HPI  CHF/Hx cardiomyopathy/PAF/Hx BLE Edema: Seeing Christell Faith PA-C and Dr. Leonides Sake with cardiology - had follow up 12/07/2019 - noted to be in ectopic atrial rhythm at that time. He notes mild BLE edema that is dependent; denies chest pain, or shortness of breath.  He is taking lasix daily.  Off of Metoprolol  Thoracic Aneurysm: CTA has been stable, however he has repeat scheduled in 1 month.   Varicose Veins: Has veins in bilateral legs and scrotum, he does not see vein and vascular at this time (was told to follow up only PRN), these do not cause him pain, no LE edema.   CAD/HLD: Taking simvastatin and tolerating well; no chest pain, shortness of breath, or myalgias. Seeing Cardiology every 6 months. No changes.  Anemia/B12 deficiency: Had low iron and low B12 at last visit, last CBC was stable 2 months ago.  Taking Vitamin B12 and iron supplement.   Rosanna Randy Syndrome: Bilirubin has been stable, no abdominal pain, no changes in BM's, no blood in stool. No concerns today.  Prediabetes: Denies polydipsia, polyphagia, or polyuria; A1C over the last year has been very stable. We will check  labs as well.  Urinary Changes: Notes decreased force of urine stream, some nocturia over the last few months.  Denies fevers/chills/abdominal pain. We will check PSA in labs today.   CKD Stage 3: Has CKD, not on ACE-I or ARB, due for labs today.  No changes.  Athritis of Knees: Seeing ortho for injections, feeling better since steroid injections.  He notes some difficulty with getting up and down when he is bent down or on the floor fixing things, walking on uneven ground like in the yard, etc. Declines referral to neurology or second opinion for ortho at this time - wants to follow up with ortho.  Patient Active Problem List   Diagnosis Date Noted  . Asymptomatic varicose veins of both lower extremities 08/16/2019  . Iron deficiency 08/16/2019  . Anemia 08/13/2019  . Vitamin B12 deficiency 03/24/2019  . Congestive heart failure (CHF) ([place]) 02/22/2019  . Right inguinal hernia 01/21/2019  . Multifocal atrial tachycardia ([place]) 11/11/2018  . Bilateral leg edema 11/11/2018  . Splenic laceration, subsequent encounter 11/10/2018  . Hemoperitoneum   . Near syncope   . Splenic rupture 10/29/2018  . Prediabetes 08/27/2018  . Gilbert syndrome 05/13/2018  . Benign neoplasm of ascending colon   . Rectal polyp   . Benign neoplasm of descending colon   . Positive colorectal cancer screening using Cologuard test 08/07/2017  . Coronary artery disease involving native coronary artery of native heart without angina pectoris 07/02/2017  . Thoracic aortic aneurysm without rupture ([place]) 07/02/2017  .  Cardiomyopathy ([place]) 06/13/2017  . Abnormal EKG 04/24/2017  . Dyspnea on exertion 04/24/2017  . Encounter for follow-up surveillance of skin cancer 09/04/2016  . Eyelashes turned in 09/04/2016  . Hyperbilirubinemia 05/02/2016  . Benign fibroma of prostate 10/04/2015  . Atherosclerosis of coronary artery 10/04/2015  . Gonalgia 10/04/2015  . Degeneration of intervertebral disc of lumbar region  10/04/2015  . Dyslipidemia 10/04/2015  . Failure of erection 10/04/2015  . Hemorrhoid 10/04/2015  . Bilirubinemia 10/04/2015  . Blood glucose elevated 10/04/2015  . Keratosis 10/04/2015  . Chronic kidney disease (CKD), stage III (moderate) ([place]) 10/04/2015  . Long Q-T syndrome 10/04/2015  . Arthritis of knee, degenerative 10/04/2015  . Arthritis of shoulder region, degenerative 10/04/2015  . Scalp tenderness 10/04/2015  . Skin lesion 10/04/2015  . Scrotal varicose veins 10/04/2015  . Cataract of both eyes 10/04/2015  . Osteoarthritis of left knee 03/31/2015    Past Surgical History:  Procedure Laterality Date  . APPENDECTOMY     Dr Emilio Math  . CARDIAC CATHETERIZATION  2005  . CARDIAC CATHETERIZATION  2002  . CATARACT EXTRACTION W/PHACO Right 10/24/2015   Procedure: CATARACT EXTRACTION PHACO AND INTRAOCULAR LENS PLACEMENT (IOC);  Surgeon: [name], MD;  Location: [facility];  Service: Ophthalmology;  Laterality: Right;  [place] 00:52 AP% 23.5 CDE 12.35 fluid pack lot # [id number]  . CATARACT EXTRACTION W/PHACO Left 11/14/2015   Procedure: CATARACT EXTRACTION PHACO AND INTRAOCULAR LENS PLACEMENT (IOC);  Surgeon: [name], MD;  Location: [facility];  Service: Ophthalmology;  Laterality: Left;  [place] 00:55 AP% 19.1 CDE 10.66 fluid pack lot # [id number]  . COLONOSCOPY  1987  . COLONOSCOPY WITH PROPOFOL N/A 08/22/2017   Procedure: COLONOSCOPY WITH PROPOFOL;  Surgeon: [name], MD;  Location: [facility];  Service: Gastroenterology;  Laterality: N/A;  . EYE SURGERY    . HERNIA REPAIR     umbilical/ Dr Emilio Math  . INGUINAL HERNIA REPAIR Right 02/04/2019   Procedure: HERNIA REPAIR INGUINAL WITH MESH;  Surgeon: Olean Ree, MD;  Location: [facility];  Service: General;  Laterality: Right;  . IR ANGIOGRAM VISCERAL SELECTIVE  10/29/2018  . IR EMBO ART  VEN HEMORR LYMPH EXTRAV  INC GUIDE ROADMAPPING  10/29/2018  . IR THORACENTESIS ASP PLEURAL SPACE W/IMG GUIDE  11/23/2018  . IR US  GUIDE VASC ACCESS RIGHT  10/29/2018  . KNEE ARTHROSCOPY Bilateral   . POLYPECTOMY  08/22/2017   Procedure: POLYPECTOMY;  Surgeon: [name], MD;  Location: [place] [facility];  Service: Gastroenterology;;  . RIGHT/LEFT HEART CATH AND CORONARY ANGIOGRAPHY N/A 06/13/2017   Procedure: Right/Left Heart Cath and Coronary Angiography;  Surgeon: [name], MD;  Location: [facility];  Service: Cardiovascular;  Laterality: N/A;  . TONSILLECTOMY      Family History  Problem Relation Age of Onset  . Alzheimer's disease Mother   . Pulmonary embolism Father   . Diabetes Father   . Heart attack Father 27  . Diabetes Brother   . Heart disease Brother        CABG  . Heart disease Brother        CABG  . Diabetes Brother   . Healthy Brother     Social History   Socioeconomic History  . Marital status: Married    Spouse name: Jeanett Schlein  . Number of children: 2  . Years of education: Not on file  . Highest education level: 7th grade  Occupational History  . Occupation: Retired  Tobacco Use  . Smoking status:  Former Smoker    Packs/day: 1.50    Years: 20.00    Pack years: 30.00    Types: Cigarettes    Quit date: 1987    Years since quitting: 34.0  . Smokeless tobacco: Former Systems developer    Types: Chew    Quit date: 2000  . Tobacco comment: smoking cessation materials not required  Substance and Sexual Activity  . Alcohol use: No    Alcohol/week: 0.0 standard drinks  . Drug use: No  . Sexual activity: Never  Other Topics Concern  . Not on file  Social History Narrative  . Not on file   Social Determinants of Health   Financial Resource Strain:   . Difficulty of Paying Living Expenses: Not on file  Food Insecurity:   . Worried About Charity fundraiser in the Last Year: Not on file  . Ran Out of Food in the Last Year: Not on file  Transportation Needs:   . Lack of Transportation (Medical): Not on file  . Lack of Transportation (Non-Medical): Not on file  Physical  Activity:   . Days of Exercise per Week: Not on file  . Minutes of Exercise per Session: Not on file  Stress:   . Feeling of Stress : Not on file  Social Connections:   . Frequency of Communication with Friends and Family: Not on file  . Frequency of Social Gatherings with Friends and Family: Not on file  . Attends Religious Services: Not on file  . Active Member of Clubs or Organizations: Not on file  . Attends Archivist Meetings: Not on file  . Marital Status: Not on file  Intimate Partner Violence:   . Fear of Current or Ex-Partner: Not on file  . Emotionally Abused: Not on file  . Physically Abused: Not on file  . Sexually Abused: Not on file     Current Outpatient Medications:  .  B COMPLEX VITAMINS PO, Take 1 tablet by mouth daily. , Disp: , Rfl:  .  Carboxymethylcellul-Glycerin (LUBRICATING EYE DROPS OP), Apply 1 drop to eye as needed (dry eyes). , Disp: , Rfl:  .  diclofenac sodium (VOLTAREN) 1 % GEL, Apply 4 g topically 4 (four) times daily. To the knee if needed, Disp: 100 g, Rfl: 2 .  furosemide (LASIX) 20 MG tablet, Take 1 tablet (20 mg total) by mouth daily as needed for edema (for weight gain greater than 3 lbs overnight, 5 lbs in a week, for swelling or increased shortness of breath.)., Disp: 90 tablet, Rfl: 1 .  Guaifenesin (MUCINEX MAXIMUM STRENGTH) 1200 MG TB12, Take 1,200 mg by mouth at bedtime., Disp: , Rfl:  .  hydrocortisone 2.5 % cream, , Disp: , Rfl:  .  simvastatin (ZOCOR) 40 MG tablet, TAKE 1 TABLET EVERY DAY  AT  6PM (PLEASE KEEP UPCOMING APPOINTMENT FOR FURTHER REFILLS), Disp: 90 tablet, Rfl: 0 .  docusate sodium (COLACE) 250 MG capsule, Take 250 mg by mouth at bedtime as needed for constipation., Disp: , Rfl:  .  ferrous sulfate 325 (65 FE) MG EC tablet, Take 325 mg by mouth daily with breakfast., Disp: , Rfl:   Allergies  Allergen Reactions  . Macrolides And Ketolides Other (See Comments)    Prolonged Q-T syndrome  . Norvasc [Amlodipine]  Other (See Comments)    Bradycardia  . Avodart [Dutasteride] Itching  . Tape Rash    I personally reviewed active problem list, medication list, allergies, notes from last encounter, lab results  with the patient/caregiver today.   ROS  Ten systems reviewed and is negative except as mentioned in HPI  Objective  Virtual encounter, vitals not obtained.  There is no height or weight on file to calculate BMI.  Physical Exam  Pulmonary/Chest: Effort normal. No respiratory distress. Speaking in complete sentences Neurological: Pt is alert and oriented to person, place, and time. Speech is normal. Psychiatric: Patient has a normal mood and affect. behavior is normal. Judgment and thought content normal.  No results found for this or any previous visit (from the past 72 hour(s)).  PHQ2/9: Depression screen [facility] 2/9 12/14/2019 12/13/2019 08/13/2019 07/30/2019 06/22/2019  Decreased Interest 0 0 0 3 1  Down, Depressed, Hopeless 0 0 0 1 2  PHQ - 2 Score 0 0 0 4 3  Altered sleeping 0 - 0 0 1  Tired, decreased energy 0 - 0 0 3  Change in appetite 0 - 0 0 0  Feeling bad or failure about yourself  0 - 0 0 0  Trouble concentrating 0 - 0 1 0  Moving slowly or fidgety/restless 0 - 0 1 0  Suicidal thoughts 0 - 0 0 0  PHQ-9 Score 0 - 0 6 7  Difficult doing work/chores - - Not difficult at all Somewhat difficult Somewhat difficult  Some recent data might be hidden   PHQ-2/9 Result is negative.    Fall Risk: Fall Risk  12/14/2019 12/13/2019 10/04/2019 08/13/2019 07/30/2019  Falls in the past year? 0 0 1 0 0  Number falls in past yr: 0 - 0 0 0  Comment - - patient states "no falls since back in the winter" - -  Injury with Fall? 0 - - 0 0  Risk for fall due to : - - - - -  Risk for fall due to: Comment - - - - -  Follow up - Falls prevention discussed - - Falls prevention discussed    Assessment & Plan  1. Systolic congestive heart failure, unspecified HF chronicity (HCC) 2.  Cardiomyopathy, unspecified type ([place]) 3. PAF (paroxysmal atrial fibrillation) ([place]) - Seeing cardiology  4. Bilateral leg edema - Elevating legs, taking lasix, well controlled.  5. Thoracic aortic aneurysm without rupture ([place]) - Follow up CT Jan 21  6. Coronary artery disease involving native coronary artery of native heart without angina pectoris - Statin therapy  7. Iron deficiency - Taking supplement - CBC - Iron, TIBC and Ferritin Panel - B12 and Folate Panel  8. Anemia, unspecified type - Taking iron supplement - CBC - Iron, TIBC and Ferritin Panel - B12 and Folate Panel  9. Vitamin B12 deficiency - Taking B12 supplement - B12 and Folate Panel  10. Gilbert syndrome - CMP per orders  11. Prediabetes - Hemoglobin A1c - COMPLETE METABOLIC PANEL WITH GFR  12. Stage 3 chronic kidney disease, unspecified whether stage 3a or 3b CKD - COMPLETE METABOLIC PANEL WITH GFR - CBC  13. Primary osteoarthritis of both knees - Seeing ortho  14. Nocturia - PSA  I discussed the assessment and treatment plan with the patient. The patient was provided an opportunity to ask questions and all were answered. The patient agreed with the plan and demonstrated an understanding of the instructions.   The patient was advised to call back or seek an in-person evaluation if the symptoms worsen or if the condition fails to improve as anticipated.  I provided 22 minutes of non-face-to-face time during this encounter.  Hubbard Hartshorn, FNP

## 2019-12-21 ENCOUNTER — Ambulatory Visit: Admission: RE | Admit: 2019-12-21 | Payer: Medicare HMO | Source: Ambulatory Visit

## 2019-12-21 DIAGNOSIS — R351 Nocturia: Secondary | ICD-10-CM | POA: Diagnosis not present

## 2019-12-21 DIAGNOSIS — R7303 Prediabetes: Secondary | ICD-10-CM | POA: Diagnosis not present

## 2019-12-21 DIAGNOSIS — E538 Deficiency of other specified B group vitamins: Secondary | ICD-10-CM | POA: Diagnosis not present

## 2019-12-21 DIAGNOSIS — E611 Iron deficiency: Secondary | ICD-10-CM | POA: Diagnosis not present

## 2019-12-21 DIAGNOSIS — N183 Chronic kidney disease, stage 3 unspecified: Secondary | ICD-10-CM | POA: Diagnosis not present

## 2019-12-21 DIAGNOSIS — D649 Anemia, unspecified: Secondary | ICD-10-CM | POA: Diagnosis not present

## 2019-12-22 ENCOUNTER — Other Ambulatory Visit: Payer: Self-pay | Admitting: Internal Medicine

## 2019-12-22 LAB — CBC
HCT: 40.7 % (ref 38.5–50.0)
Hemoglobin: 14 g/dL (ref 13.2–17.1)
MCH: 32.4 pg (ref 27.0–33.0)
MCHC: 34.4 g/dL (ref 32.0–36.0)
MCV: 94.2 fL (ref 80.0–100.0)
MPV: 10.3 fL (ref 7.5–12.5)
Platelets: 174 10*3/uL (ref 140–400)
RBC: 4.32 10*6/uL (ref 4.20–5.80)
RDW: 11.9 % (ref 11.0–15.0)
WBC: 6.7 10*3/uL (ref 3.8–10.8)

## 2019-12-22 LAB — COMPLETE METABOLIC PANEL WITH GFR
AG Ratio: 1.8 (calc) (ref 1.0–2.5)
ALT: 9 U/L (ref 9–46)
AST: 18 U/L (ref 10–35)
Albumin: 4.2 g/dL (ref 3.6–5.1)
Alkaline phosphatase (APISO): 55 U/L (ref 35–144)
BUN/Creatinine Ratio: 17 (calc) (ref 6–22)
BUN: 23 mg/dL (ref 7–25)
CO2: 35 mmol/L — ABNORMAL HIGH (ref 20–32)
Calcium: 9.4 mg/dL (ref 8.6–10.3)
Chloride: 100 mmol/L (ref 98–110)
Creat: 1.34 mg/dL — ABNORMAL HIGH (ref 0.70–1.11)
GFR, Est African American: 57 mL/min/{1.73_m2} — ABNORMAL LOW (ref >=60)
GFR, Est Non African American: 49 mL/min/{1.73_m2} — ABNORMAL LOW (ref >=60)
Globulin: 2.4 g/dL (calc) (ref 1.9–3.7)
Glucose, Bld: 101 mg/dL — ABNORMAL HIGH (ref 65–99)
Potassium: 4.5 mmol/L (ref 3.5–5.3)
Sodium: 140 mmol/L (ref 135–146)
Total Bilirubin: 1.8 mg/dL — ABNORMAL HIGH (ref 0.2–1.2)
Total Protein: 6.6 g/dL (ref 6.1–8.1)

## 2019-12-22 LAB — PSA: PSA: 1 ng/mL (ref <=4.0)

## 2019-12-22 LAB — IRON,TIBC AND FERRITIN PANEL
%SAT: 41 % (calc) (ref 20–48)
Ferritin: 214 ng/mL (ref 24–380)
Iron: 113 ug/dL (ref 50–180)
TIBC: 273 mcg/dL (calc) (ref 250–425)

## 2019-12-22 LAB — HEMOGLOBIN A1C
Hgb A1c MFr Bld: 5.6 % of total Hgb (ref <5.7)
Mean Plasma Glucose: 114 (calc)
eAG (mmol/L): 6.3 (calc)

## 2019-12-22 LAB — B12 AND FOLATE PANEL
Folate: >24 ng/mL
Vitamin B-12: 407 pg/mL (ref 200–1100)

## 2019-12-28 ENCOUNTER — Telehealth: Payer: Self-pay

## 2019-12-28 ENCOUNTER — Other Ambulatory Visit: Payer: Self-pay

## 2019-12-28 ENCOUNTER — Ambulatory Visit
Admission: RE | Admit: 2019-12-28 | Discharge: 2019-12-28 | Disposition: A | Discharge status: Home / Self Care | Payer: Medicare HMO | Source: Ambulatory Visit | Attending: Family Medicine | Admitting: Family Medicine

## 2019-12-28 DIAGNOSIS — I712 Thoracic aortic aneurysm, without rupture, unspecified: Secondary | ICD-10-CM

## 2019-12-28 MED ORDER — IOHEXOL 350 MG/ML SOLN
100.0000 mL | Freq: Once | INTRAVENOUS | Status: AC | PRN
  Administered 2019-12-28: 100 mL via INTRAVENOUS

## 2019-12-28 NOTE — Telephone Encounter (Signed)
Attempted to call patient. [facility] 12/28/2019

## 2019-12-28 NOTE — Telephone Encounter (Signed)
-----   Message from [place] sent at 12/28/2019 10:52 AM EST -----  ----- Message ----- From: Verta Ellen., NP Sent: 12/28/2019  10:43 AM EST To: Massie Maroon, CMA  Hey Mr. Lardieri Thoracic aneurysm  is stable and has not gotten larger. Still at 4.3 cm. I guess just call him and tell him this is good that it is not getting any larger.  Thanks

## 2019-12-28 NOTE — Telephone Encounter (Signed)
Call to patient to discuss results. No further questions or oders at this time.   Follow up as planned.   Advised pt to call for any further questions or concerns.

## 2019-12-30 DIAGNOSIS — H02053 Trichiasis without entropian right eye, unspecified eyelid: Secondary | ICD-10-CM | POA: Diagnosis not present

## 2019-12-30 DIAGNOSIS — D3132 Benign neoplasm of left choroid: Secondary | ICD-10-CM | POA: Diagnosis not present

## 2020-02-16 ENCOUNTER — Other Ambulatory Visit: Payer: Self-pay | Admitting: Internal Medicine

## 2020-03-03 ENCOUNTER — Telehealth: Payer: Medicare HMO

## 2020-03-03 ENCOUNTER — Ambulatory Visit: Payer: Self-pay

## 2020-03-03 NOTE — Chronic Care Management (AMB) (Signed)
  Chronic Care Management   Outreach Note  03/03/2020 Name: Anthony Mosley MRN: [id number] DOB: Nov 27, 1935   Primary Care Provider: Hubbard Hartshorn, FNP Reason for referral : Chronic Care Management   An unsuccessful telephone outreach was attempted today. Anthony Mosley is currently engaged with the Chronic Care Management team. A routine follow-up was attempted today.  Spoke with a member in the home. Indicated that Anthony Mosley was not available at the time of the call. Agreed to relay message for him to return the call.    Follow Up Plan:  Anticipate follow up outreach within the next week.    [facility] Care Management [phone]

## 2020-03-03 NOTE — Patient Instructions (Addendum)
Thank you for allowing the Chronic Care Management team to participate in your care.  Goals Addressed            This Visit's Progress   . "I try to keep up with my blood pressure and my weight" (pt-stated)       Current Barriers:  . Chronic Disease Management support, education, and care coordination needs related to CHF and HLD   Clinical Goal(s) related to CHF and HLD:  . Over the next 90 days, patient will monitor blood pressure and pulse daily and record readings. . Over the next 90 days, patient will weigh daily and maintain a log.  . Over the next 90 days ,patient will take all medications as prescribed. . Over the next 90 days, patient will attend all provider appointments as scheduled.  . Over the next 90 days, patient will adhere to recommended cardiac prudent/heart healthy diet.   Interventions related to CHF and HLD:  . Reviewed medications. Encouraged to take all medications as prescribed. Encouraged to notify provider if unable to tolerate prescribed regimen. Reports self administering medications without difficulty. . Discussed blood pressure and pulse ranges along with established parameters. Encouraged to monitor daily and maintain a log. Reports systolic readings have ranged in the 120's. Reports diastolic readings in the 0000000. Reports pulse has ranged from the high 50's to 60's.  . Reviewed s/sx of CHF complications along with indications for notifying MD. Encouraged to monitor weight and report weight gain greater than 3 pounds overnight or 5 pounds within a week. Reports weight of 189lbs. . Discussed nutritional intake and importance of adhering to cardiac prudent/heart healthy diet. . Discussed activity tolerance and provided education regarding safety precautions. Patient encouraged to practice safety measures and avoid activities that could potentially cause overexertion.   Patient Self Care Activities related to CHF and HLD:  . Patient requires assistance with  self-managing chronic health conditions . Patient takes medications as prescribed . Patient attends medical appointments as scheduled . Performs ADL's independently.  Please see past updates related to this goal by clicking on the "Past Updates" button in the selected goal         Anthony Mosley verbalized understanding of the instructions provided during the telephonic outreach today. Declined need for mailed/printed copy of the instructions.   The care management team will follow up with Anthony Mosley in 3 months. He was encouraged to contact the team for any health related concerns if needed prior to the next outreach.   [facility] [phone]

## 2020-03-08 ENCOUNTER — Ambulatory Visit (INDEPENDENT_AMBULATORY_CARE_PROVIDER_SITE_OTHER): Payer: Medicare HMO

## 2020-03-08 DIAGNOSIS — E785 Hyperlipidemia, unspecified: Secondary | ICD-10-CM | POA: Diagnosis not present

## 2020-03-08 DIAGNOSIS — I509 Heart failure, unspecified: Secondary | ICD-10-CM

## 2020-03-08 NOTE — Chronic Care Management (AMB) (Signed)
Chronic Care Management   Follow Up Note   03/08/2020 Name: Anthony Mosley MRN: [id number] DOB: [birth year]  Primary Care Provider: Hubbard Hartshorn, FNP Reason for referral : Chronic Case Management   Anthony Mosley is a 85 y.o. year old male who is a primary care patient of Anthony Hartshorn, FNP. He is currently engaged with the chronic care management team. A routine telephonic outreach was conducted today.  [name] reports receiving both required doses for the Covid-19 vaccination series.  Review of his status, including review of consultants reports, relevant labs and test results was conducted today. Collaboration with appropriate care team members was performed as part of the comprehensive evaluation and provision of chronic care management services.    SDOH (Social Determinants of Health) assessments performed: Yes See Care Plan activities for detailed interventions related to [facility])     Outpatient Encounter Medications as of 03/08/2020  Medication Sig Note  . B COMPLEX VITAMINS PO Take 1 tablet by mouth daily.    . Carboxymethylcellul-Glycerin (LUBRICATING EYE DROPS OP) Apply 1 drop to eye as needed (dry eyes).    [name] diclofenac sodium (VOLTAREN) 1 % GEL Apply 4 g topically 4 (four) times daily. To the knee if needed   . docusate sodium (COLACE) 250 MG capsule Take 250 mg by mouth at bedtime as needed for constipation.   . ferrous sulfate 325 (65 FE) MG EC tablet Take 325 mg by mouth daily with breakfast.   . furosemide (LASIX) 20 MG tablet Take 1 tablet (20 mg total) by mouth daily as needed for edema (for weight gain greater than 3 lbs overnight, 5 lbs in a week, for swelling or increased shortness of breath.).   [name] Guaifenesin (MUCINEX MAXIMUM STRENGTH) 1200 MG TB12 Take 1,200 mg by mouth at bedtime. 10/04/2019: Takes qhs for congestion  . hydrocortisone 2.5 % cream    . simvastatin (ZOCOR) 40 MG tablet TAKE 1 TABLET EVERY DAY AT 6 PM    No facility-administered  encounter medications on file as of 03/08/2020.     Goals Addressed            This Visit's Progress   . Caregiver Stress       Current Barriers:  . 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  Case Manager Clinical Goal(s):  [name] Over the next 90 days, patient will work with care management team to address needs related to caregiver stress/management of care for spouse   Interventions:  . Evaluation of current treatment plan related to caregiver stress and patient's adherence to plan as established by provider. . Discussed plans with patient for ongoing care management follow up and provided patient with direct contact information for care management team . Discussed current plan regarding in-home assistance for his spouse. Reports concerns that his spouse's status is declining. Reports being very pleased with current in-home assistant.  Assistant comes to the home for 5 hours every Tuesday a Thursday. He declines urgent need for additional assistance in the home but expressed interest in discussing options for long term care. Prefers a facility that can address challenges related to memory care. He is agreeable to follow up outreach with the CCM LCSW.   Self Care Activities: Patient serves as the primary caregiver for his spouse. . Patient's spouse is unable to perform ADL's independently . Pateint's spouse is unable to perform IADL's independently   Please see past updates related to this goal by clicking on the "Past  Updates" button in the selected goal      . Chronic Disease Management       Current Barriers:  . Chronic Disease Management support, education, and care coordination needs related to CHF and HLD   Clinical Goal(s) related to CHF and HLD:  . Over the next 90 days, patient will monitor blood pressure and pulse daily and record readings. . Over the next 90 days, patient will weigh daily and maintain a log.  . Over the next 90 days ,patient will  take all medications as prescribed. . Over the next 90 days, patient will attend all provider appointments as scheduled.  . Over the next 90 days, patient will adhere to recommended cardiac prudent/heart healthy diet.  . Over the next 90 days, patient will follow recommended safety measures to prevent falls.  Interventions related to CHF and HLD:  . Reviewed medications. Encouraged to take all medications as prescribed. Encouraged to notify provider if unable to tolerate prescribed regimen.  . Discussed blood pressure and pulse ranges along with established parameters. Encouraged to monitor daily and maintain a log. Reports systolic range of from [id number] to 139. Reports diastolic readings in the high 60's.  . Reviewed s/sx of CHF complications along with indications for notifying MD. Encouraged to monitor weight and report weight gain greater than 3 pounds overnight or 5 pounds within a week. Denies worsening s/sx related to CHF or fluid retention. Reports weight of 199lbs. . Discussed nutritional intake and importance of adhering to cardiac prudent/heart healthy diet. . Discussed activity tolerance and provided education regarding safety precautions. Patient encouraged to practice safety measures and avoid activities that could potentially cause overexertion. Encouraged to be particularly careful when assisting and repositioning his spouse. . Reviewed pending appointments. Encouraged to attend appointments as scheduled to prevent delays in care. Encouraged to contact chronic care management team if transportation assistance is needed.   Patient Self Care Activities related to CHF and HLD:  . Patient takes medications as prescribed . Patient attends medical appointments as scheduled . Performs ADL's independently.  Please see past updates related to this goal by clicking on the "Past Updates" button in the selected goal           PLAN Anthony Mosley is agreeable to follow up outreach with the  CCM LCSW to discuss concerns regarding long term care for his spouse. Will update LCSW and follow up with Anthony Mosley within the next three to four weeks. He was encouraged to call with health related questions or concerns if needed prior to the next scheduled outreach.   [facility] Care Management [phone]
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
## 2020-03-10 ENCOUNTER — Ambulatory Visit: Payer: Medicare HMO | Admitting: Family Medicine

## 2020-03-10 ENCOUNTER — Other Ambulatory Visit: Payer: Self-pay

## 2020-03-10 ENCOUNTER — Encounter: Payer: Self-pay | Admitting: Physician Assistant

## 2020-03-10 VITALS — BP 116/66 | HR 44 | Ht 73.0 in | Wt 206.4 lb

## 2020-03-10 DIAGNOSIS — I712 Thoracic aortic aneurysm, without rupture, unspecified: Secondary | ICD-10-CM

## 2020-03-10 DIAGNOSIS — I480 Paroxysmal atrial fibrillation: Secondary | ICD-10-CM | POA: Diagnosis not present

## 2020-03-10 DIAGNOSIS — I429 Cardiomyopathy, unspecified: Secondary | ICD-10-CM

## 2020-03-10 DIAGNOSIS — I739 Peripheral vascular disease, unspecified: Secondary | ICD-10-CM | POA: Diagnosis not present

## 2020-03-10 DIAGNOSIS — I2510 Atherosclerotic heart disease of native coronary artery without angina pectoris: Secondary | ICD-10-CM

## 2020-03-10 NOTE — Patient Instructions (Signed)
Medication Instructions:  Your physician recommends that you continue on your current medications as directed. Please refer to the Current Medication list given to you today.  *If you need a refill on your cardiac medications before your next appointment, please call your pharmacy*   Lab Work: None ordered  If you have labs (blood work) drawn today and your tests are completely normal, you will receive your results only by: [name] MyChart Message (if you have MyChart) OR . A paper copy in the mail If you have any lab test that is abnormal or we need to change your treatment, we will call you to review the results.   Testing/Procedures: None ordered    Follow-Up: At [facility], you and your health needs are our priority.  As part of our continuing mission to provide you with exceptional heart care, we have created designated Provider Care Teams.  These Care Teams include your primary Cardiologist (physician) and Advanced Practice Providers (APPs -  Physician Assistants and Nurse Practitioners) who all work together to provide you with the care you need, when you need it.  We recommend signing up for the patient portal called "MyChart".  Sign up information is provided on this After Visit Summary.  MyChart is used to connect with patients for Virtual Visits (Telemedicine).  Patients are able to view lab/test results, encounter notes, upcoming appointments, etc.  Non-urgent messages can be sent to your provider as well.   To learn more about what you can do with MyChart, go to [email or web address].    Your next appointment:   6 month(s)  The format for your next appointment:   In Person  Provider:    You may see [name], MD or one of the following Advanced Practice Providers on your designated Care Team:    Murray Hodgkins, NP  Christell Faith, PA-C  Marrianne Mood, PA-C    Other Instructions Referral to Vascular Dr. Lucky Cowboy

## 2020-03-12 NOTE — Patient Instructions (Signed)
Thank you for allowing the Chronic Care Management team to participate in your care.  Goals Addressed            This Visit's Progress   . Caregiver Stress       Current Barriers:  . 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  Case Manager Clinical Goal(s):  [name] Over the next 90 days, patient will work with care management team to address needs related to caregiver stress/management of care for spouse   Interventions:  . Evaluation of current treatment plan related to caregiver stress and patient's adherence to plan as established by provider. . Discussed plans with patient for ongoing care management follow up and provided patient with direct contact information for care management team . Discussed current plan regarding in-home assistance for his spouse. Reports concerns that his spouse's status is declining. Reports being very pleased with current in-home assistant.  Assistant comes to the home for 5 hours every Tuesday a Thursday. He declines urgent need for additional assistance in the home but expressed interest in discussing options for long term care. Prefers a facility that can address challenges related to memory care. He is agreeable to follow up outreach with the CCM LCSW.   Self Care Activities: Patient serves as the primary caregiver for his spouse. . Patient's spouse is unable to perform ADL's independently . Pateint's spouse is unable to perform IADL's independently   Please see past updates related to this goal by clicking on the "Past Updates" button in the selected goal      . Chronic Disease Management       Current Barriers:  . Chronic Disease Management support, education, and care coordination needs related to CHF and HLD   Clinical Goal(s) related to CHF and HLD:  . Over the next 90 days, patient will monitor blood pressure and pulse daily and record readings. . Over the next 90 days, patient will weigh daily and maintain a log.  . Over  the next 90 days ,patient will take all medications as prescribed. . Over the next 90 days, patient will attend all provider appointments as scheduled.  . Over the next 90 days, patient will adhere to recommended cardiac prudent/heart healthy diet.  . Over the next 90 days, patient will follow recommended safety measures to prevent falls.  Interventions related to CHF and HLD:  . Reviewed medications. Encouraged to take all medications as prescribed. Encouraged to notify provider if unable to tolerate prescribed regimen.  . Discussed blood pressure and pulse ranges along with established parameters. Encouraged to monitor daily and maintain a log. Reports systolic range of from [id number] to 139. Reports diastolic readings in the high 60's.  . Reviewed s/sx of CHF complications along with indications for notifying MD. Encouraged to monitor weight and report weight gain greater than 3 pounds overnight or 5 pounds within a week. Denies worsening s/sx related to CHF or fluid retention. Reports weight of 199lbs. . Discussed nutritional intake and importance of adhering to cardiac prudent/heart healthy diet. . Discussed activity tolerance and provided education regarding safety precautions. Patient encouraged to practice safety measures and avoid activities that could potentially cause overexertion. Encouraged to be particularly careful when assisting and repositioning his spouse. . Reviewed pending appointments. Encouraged to attend appointments as scheduled to prevent delays in care. Encouraged to contact chronic care management team if transportation assistance is needed.   Patient Self Care Activities related to CHF and HLD:  . Patient takes medications as  prescribed . Patient attends medical appointments as scheduled . Performs ADL's independently.  Please see past updates related to this goal by clicking on the "Past Updates" button in the selected goal         Mr. Larmore verbalized  understanding of the instructions provided during the telephonic outreach today. Declined need for a mailed/printed copy of the instructions.   Mr. Weeber is agreeable to follow up outreach with the CCM LCSW to discuss concerns regarding long term care for his spouse. Will update LCSW and follow up with Mr. Maxim within the next three to four weeks. He was encouraged to call with health related questions or concerns if needed prior to the next scheduled outreach.   [facility] Care Management [phone]

## 2020-03-28 ENCOUNTER — Other Ambulatory Visit: Payer: Self-pay

## 2020-03-28 ENCOUNTER — Encounter (INDEPENDENT_AMBULATORY_CARE_PROVIDER_SITE_OTHER): Payer: Self-pay | Admitting: Vascular Surgery

## 2020-03-28 ENCOUNTER — Ambulatory Visit (INDEPENDENT_AMBULATORY_CARE_PROVIDER_SITE_OTHER): Payer: Medicare HMO | Admitting: Vascular Surgery

## 2020-03-28 VITALS — BP 152/69 | HR 76 | Resp 16 | Ht 73.0 in | Wt 205.0 lb

## 2020-03-28 DIAGNOSIS — M79605 Pain in left leg: Secondary | ICD-10-CM

## 2020-03-28 DIAGNOSIS — M79609 Pain in unspecified limb: Secondary | ICD-10-CM | POA: Insufficient documentation

## 2020-03-28 DIAGNOSIS — N183 Chronic kidney disease, stage 3 unspecified: Secondary | ICD-10-CM

## 2020-03-28 DIAGNOSIS — E785 Hyperlipidemia, unspecified: Secondary | ICD-10-CM | POA: Diagnosis not present

## 2020-03-28 DIAGNOSIS — I712 Thoracic aortic aneurysm, without rupture, unspecified: Secondary | ICD-10-CM

## 2020-03-28 DIAGNOSIS — M79604 Pain in right leg: Secondary | ICD-10-CM | POA: Diagnosis not present

## 2020-03-28 NOTE — Assessment & Plan Note (Signed)
I reviewed his CT scan from earlier this year, and this was 4.3 cm in the ascending aorta and does not represent an immediate problem.  This has been followed regularly with radiologic studies.

## 2020-03-28 NOTE — Progress Notes (Signed)
Patient ID: Anthony Mosley, male   DOB: 04-08-1935, 85 y.o.   MRN: [id number]  Chief Complaint  Patient presents with   New Patient (Initial Visit)    ref. dumm claudication     HPI Anthony Mosley is a 85 y.o. male.  I am asked to see the patient by R. Dunn, PA-C for evaluation of claudication symptoms.  The patient describes cramping in his calf and lower legs with walking.  This comes on after reasonably short distances.  It involves both lower extremities and generally is relieved with rest.  The patient has multiple atherosclerotic risk factors as described below.  He is well versed with peripheral arterial disease due to the fact that his wife has already lost the leg from peripheral arterial disease and has been followed for this for many years.  He reports no ulceration or infection.  He has no symptoms that sounds like ischemic rest pain.  Both legs are affected about the same.  He has some significant arthritic issues to and was unsure of that could be causing a lot of his leg pain.     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 seen on CT.   CAD (coronary artery disease)    a.  Elective R/LHC 06/12/18 showed mild-moderate nonobstructive RCA CAD with mild LAD myocardial bridging but no significant obstructive CAD; there was mildly elevated L heart filling pressure, moderately elevated R heart filling pressure, and mild pulm HTN.   Cancer [facility])    SKIN   Chronic knee pain    Cough    Edema    FEET/LEGS   Former tobacco use    Hemorrhoids    Hyperbilirubinemia    Hyperlipidemia    Mild aortic insufficiency    Mild pulmonary hypertension (HCC)    Moderate mitral regurgitation    NICM (nonischemic cardiomyopathy) ([place])    a. EF 40-45% by echo 05/2017.   Pleural effusion, left 11/2018   Pneumonia    PONV (postoperative nausea and vomiting)    after first cataract   Rosacea    Ruptured spleen    Thoracic  aortic aneurysm (HCC)    a. 4.4cm by CT 12/2017, imaged incompletely at 4.3cm in 10/2018 CT.   Wears dentures    partial upper   Wheezing     Past Surgical History:  Procedure Laterality Date   APPENDECTOMY     Dr Emilio Math   CARDIAC CATHETERIZATION  2005   CARDIAC CATHETERIZATION  2002   CATARACT EXTRACTION W/PHACO Right 10/24/2015   Procedure: CATARACT EXTRACTION PHACO AND INTRAOCULAR LENS PLACEMENT (IOC);  Surgeon: [name], MD;  Location: [facility];  Service: Ophthalmology;  Laterality: Right;  [place] 00:52 AP% 23.5 CDE 12.35 fluid pack lot # [id number]   CATARACT EXTRACTION W/PHACO Left 11/14/2015   Procedure: CATARACT EXTRACTION PHACO AND INTRAOCULAR LENS PLACEMENT (IOC);  Surgeon: [name], MD;  Location: [facility];  Service: Ophthalmology;  Laterality: Left;  [place] 00:55 AP% 19.1 CDE 10.66 fluid pack lot # [id number]   COLONOSCOPY  1987   COLONOSCOPY WITH PROPOFOL N/A 08/22/2017   Procedure: COLONOSCOPY WITH PROPOFOL;  Surgeon: [name], MD;  Location: [facility];  Service: Gastroenterology;  Laterality: N/A;   EYE SURGERY     HERNIA REPAIR     umbilical/ Dr Archie Patten HERNIA REPAIR Right 02/04/2019   Procedure: HERNIA REPAIR INGUINAL WITH MESH;  Surgeon:  Olean Ree, MD;  Location: [facility];  Service: General;  Laterality: Right;   IR ANGIOGRAM VISCERAL SELECTIVE  10/29/2018   IR EMBO ART  VEN HEMORR LYMPH EXTRAV  INC GUIDE ROADMAPPING  10/29/2018   IR THORACENTESIS ASP PLEURAL SPACE W/IMG GUIDE  11/23/2018   IR US GUIDE VASC ACCESS RIGHT  10/29/2018   KNEE ARTHROSCOPY Bilateral    POLYPECTOMY  08/22/2017   Procedure: POLYPECTOMY;  Surgeon: [name], MD;  Location: [facility];  Service: Gastroenterology;;   RIGHT/LEFT HEART CATH AND CORONARY ANGIOGRAPHY N/A 06/13/2017   Procedure: Right/Left Heart Cath and Coronary Angiography;  Surgeon: [name], MD;  Location: [facility];  Service: Cardiovascular;   Laterality: N/A;   TONSILLECTOMY       Family History  Problem Relation Age of Onset   Alzheimer's disease Mother    Pulmonary embolism Father    Diabetes Father    Heart attack Father 46   Diabetes Brother    Heart disease Brother        CABG   Heart disease Brother        CABG   Diabetes Brother    Healthy Brother     Social History   Tobacco Use   Smoking status: Former Smoker    Packs/day: 1.50    Years: 20.00    Pack years: 30.00    Types: Cigarettes    Quit date: 1987    Years since quitting: 34.3   Smokeless tobacco: Former Systems developer    Types: Chew    Quit date: 2000   Tobacco comment: smoking cessation materials not required  Substance Use Topics   Alcohol use: No    Alcohol/week: 0.0 standard drinks   Drug use: No     Allergies  Allergen Reactions   Macrolides And Ketolides Other (See Comments)    Prolonged Q-T syndrome   Norvasc [Amlodipine] Other (See Comments)    Bradycardia   Avodart [Dutasteride] Itching   Tape Rash    Current Outpatient Medications  Medication Sig Dispense Refill   B COMPLEX VITAMINS PO Take 1 tablet by mouth daily.      Carboxymethylcellul-Glycerin (LUBRICATING EYE DROPS OP) Apply 1 drop to eye as needed (dry eyes).      diclofenac sodium (VOLTAREN) 1 % GEL Apply 4 g topically 4 (four) times daily. To the knee if needed 100 g 2   Guaifenesin (MUCINEX MAXIMUM STRENGTH) 1200 MG TB12 Take 1,200 mg by mouth at bedtime.     hydrocortisone 2.5 % cream      simvastatin (ZOCOR) 40 MG tablet TAKE 1 TABLET EVERY DAY AT 6 PM 90 tablet 3   No current facility-administered medications for this visit.      REVIEW OF SYSTEMS (Negative unless checked)  Constitutional: [] Weight loss  [] Fever  [] Chills Cardiac: [] Chest pain   [] Chest pressure   [x] Palpitations   [] Shortness of breath when laying flat   [x] Shortness of breath at rest   [x] Shortness of breath with exertion. Vascular:  [] Pain in legs with walking    [] Pain in legs at rest   [] Pain in legs when laying flat   [] Claudication   [] Pain in feet when walking  [] Pain in feet at rest  [] Pain in feet when laying flat   [] History of DVT   [] Phlebitis   [] Swelling in legs   [] Varicose veins   [] Non-healing ulcers Pulmonary:   [] Uses home oxygen   [] Productive cough   [] Hemoptysis   [] Wheeze  [  x]COPD   [] Asthma Neurologic:  [x] Dizziness  [] Blackouts   [] Seizures   [] History of stroke   [] History of TIA  [] Aphasia   [] Temporary blindness   [] Dysphagia   [] Weakness or numbness in arms   [] Weakness or numbness in legs Musculoskeletal:  [x] Arthritis   [] Joint swelling   [x] Joint pain   [] Low back pain Hematologic:  [] Easy bruising  [] Easy bleeding   [x] Hypercoagulable state   [] Anemic  [] Hepatitis Gastrointestinal:  [] Blood in stool   [] Vomiting blood  [] Gastroesophageal reflux/heartburn   [] Abdominal pain Genitourinary:  [] Chronic kidney disease   [] Difficult urination  [] Frequent urination  [] Burning with urination   [] Hematuria Skin:  [] Rashes   [] Ulcers   [] Wounds Psychological:  [] History of anxiety   []  History of major depression.    Physical Exam BP (!) 152/69 (BP Location: Right Arm)    Pulse 76    Resp 16    Ht 6\' 1"  (1.854 m)    Wt 205 lb (93 kg)    BMI 27.05 kg/m  Gen:  WD/WN, NAD Head: [place]/AT, No temporalis wasting.  Ear/Nose/Throat: Hearing grossly intact, nares w/o erythema or drainage, oropharynx w/o Erythema/Exudate Eyes: Conjunctiva clear, sclera non-icteric  Neck: trachea midline.  No JVD.  Pulmonary:  Good air movement, respirations not labored, no use of accessory muscles  Cardiac: RRR, no JVD Vascular:  Vessel Right Left  Radial Palpable Palpable                          DP  1+  2+  PT  1+  1+   Gastrointestinal:. No masses, surgical incisions, or scars. Musculoskeletal: M/S 5/5 throughout.  Extremities without ischemic changes.  No deformity or atrophy.  Trace right lower extremity edema. Neurologic: Sensation grossly  intact in extremities.  Symmetrical.  Speech is fluent. Motor exam as listed above. Psychiatric: Judgment intact, Mood & affect appropriate for pt's clinical situation. Dermatologic: No rashes or ulcers noted.  No cellulitis or open wounds.    Radiology No results found.  Labs No results found for this or any previous visit (from the past 2160 hour(s)).  Assessment/Plan:  Thoracic aortic aneurysm without rupture ([place]) I reviewed his CT scan from earlier this year, and this was 4.3 cm in the ascending aorta and does not represent an immediate problem.  This has been followed regularly with radiologic studies.  Dyslipidemia lipid control important in reducing the progression of atherosclerotic disease. Continue statin therapy   Stage 3 chronic kidney disease Plan noninvasive studies to assess his perfusion initially prior to considering any contrast based media.  No immediate limb threatening symptoms.  Pain in limb The patient describes lower extremity symptoms concerning for claudication from arterial insufficiency.  He certainly has multiple atherosclerotic risk factors and given his age this has to be considered.  Noninvasive studies to be performed in the near future at his convenience.  We discussed the pathophysiology and natural history of peripheral arterial disease in some detail today.  I take care of his wife for many years, so he is already aware of peripheral arterial disease at this point.  We will see him back following his noninvasive studies to discuss the results and determine further treatment options.      Leotis Pain 03/28/2020, 11:08 AM   This note was created with Dragon medical transcription system.  Any errors from dictation are unintentional.

## 2020-03-28 NOTE — Assessment & Plan Note (Signed)
lipid control important in reducing the progression of atherosclerotic disease. Continue statin therapy  

## 2020-03-28 NOTE — Assessment & Plan Note (Signed)
The patient describes lower extremity symptoms concerning for claudication from arterial insufficiency.  He certainly has multiple atherosclerotic risk factors and given his age this has to be considered.  Noninvasive studies to be performed in the near future at his convenience.  We discussed the pathophysiology and natural history of peripheral arterial disease in some detail today.  I take care of his wife for many years, so he is already aware of peripheral arterial disease at this point.  We will see him back following his noninvasive studies to discuss the results and determine further treatment options.

## 2020-03-28 NOTE — Patient Instructions (Signed)
Peripheral Vascular Disease  Peripheral vascular disease (PVD) is a disease of the blood vessels that are not part of your heart and brain. A simple term for PVD is poor circulation. In most cases, PVD narrows the blood vessels that carry blood from your heart to the rest of your body. This can reduce the supply of blood to your arms, legs, and internal organs, like your stomach or kidneys. However, PVD most often affects a person's lower legs and feet. Without treatment, PVD tends to get worse. PVD can also lead to acute ischemic limb. This is when an arm or leg suddenly cannot get enough blood. This is a medical emergency. Follow these instructions at home: Lifestyle  Do not use any products that contain nicotine or tobacco, such as cigarettes and e-cigarettes. If you need help quitting, ask your doctor.  Lose weight if you are overweight. Or, stay at a healthy weight as told by your doctor.  Eat a diet that is low in fat and cholesterol. If you need help, ask your doctor.  Exercise regularly. Ask your doctor for activities that are right for you. General instructions  Take over-the-counter and prescription medicines only as told by your doctor.  Take good care of your feet: ? Wear comfortable shoes that fit well. ? Check your feet often for any cuts or sores.  Keep all follow-up visits as told by your doctor This is important. Contact a doctor if:  You have cramps in your legs when you walk.  You have leg pain when you are at rest.  You have coldness in a leg or foot.  Your skin changes.  You are unable to get or have an erection (erectile dysfunction).  You have cuts or sores on your feet that do not heal. Get help right away if:  Your arm or leg turns cold, numb, and blue.  Your arms or legs become red, warm, swollen, painful, or numb.  You have chest pain.  You have trouble breathing.  You suddenly have weakness in your face, arm, or leg.  You become very  confused or you cannot speak.  You suddenly have a very bad headache.  You suddenly cannot see. Summary  Peripheral vascular disease (PVD) is a disease of the blood vessels.  A simple term for PVD is poor circulation. Without treatment, PVD tends to get worse.  Treatment may include exercise, low fat and low cholesterol diet, and quitting smoking. This information is not intended to replace advice given to you by your health care provider. Make sure you discuss any questions you have with your health care provider. Document Revised: 11/14/2017 Document Reviewed: 01/09/2017 Elsevier Patient Education  2020 [employer] Inc[employer]  

## 2020-03-28 NOTE — Assessment & Plan Note (Signed)
Plan noninvasive studies to assess his perfusion initially prior to considering any contrast based media.  No immediate limb threatening symptoms.

## 2020-03-31 ENCOUNTER — Ambulatory Visit (INDEPENDENT_AMBULATORY_CARE_PROVIDER_SITE_OTHER): Payer: Medicare HMO

## 2020-03-31 DIAGNOSIS — I509 Heart failure, unspecified: Secondary | ICD-10-CM

## 2020-03-31 DIAGNOSIS — I2510 Atherosclerotic heart disease of native coronary artery without angina pectoris: Secondary | ICD-10-CM

## 2020-03-31 NOTE — Patient Instructions (Addendum)
Thank you for allowing the Chronic Care Management team to participate in your care.   Goals Addressed            This Visit's Progress   . Caregiver Stress   On track    Current Barriers:  . 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  Case Manager Clinical Goal(s):  [name] Over the next 90 days, patient will work with care management team to address needs related to caregiver stress/management of care for spouse   Interventions:  . Evaluation of current treatment plan related to caregiver stress and patient's adherence to plan as established by provider. . Discussed plans with patient for ongoing care management follow up and provided patient with direct contact information for care management team . Discussed current plan regarding in-home assistance for his spouse. Reports pending discussion with CCM LCSW later today. He plans to pursue facility placement.   Self Care Activities: Patient serves as the primary caregiver for his spouse. . Patient's spouse is unable to perform ADL's independently . Patient's spouse is unable to perform IADL's independently   Please see past updates related to this goal by clicking on the "Past Updates" button in the selected goal      . Chronic Disease Management   On track    Current Barriers:  . Chronic Disease Management support, education, and care coordination needs related to CHF and HLD   Clinical Goal(s) related to CHF and HLD:  . Over the next 90 days, patient will monitor blood pressure and pulse daily and record readings. . Over the next 90 days, patient will weigh daily and maintain a log.  . Over the next 90 days ,patient will take all medications as prescribed. . Over the next 90 days, patient will attend all provider appointments as scheduled.  . Over the next 90 days, patient will adhere to recommended cardiac prudent/heart healthy diet.  . Over the next 90 days, patient will follow recommended safety measures  to prevent falls.  Interventions related to CHF and HLD:  . Reviewed medications. Encouraged to take all medications as prescribed. Encouraged to notify provider if unable to tolerate prescribed regimen.  . Discussed blood pressure and pulse ranges along with established parameters. Encouraged to monitor daily and maintain a log. Reports one elevated reading during recent clinic visit. Reports otherwise readings have been within range. Most recent  home reading was 118/68. [name] Reviewed s/sx of CHF complications along with indications for notifying MD. Encouraged to monitor weight and report weight gain greater than 3 pounds overnight or 5 pounds within a week. Reports occasional "mild" lower extremity edema. Denies worsening s/sx. Reports current weight of 204 lbs.  . Discussed nutritional intake and importance of adhering to cardiac prudent/heart healthy diet. . Discussed activity tolerance and provided education regarding safety precautions. Patient encouraged to practice safety measures and avoid activities that could potentially cause overexertion. Encouraged to be particularly careful when assisting and repositioning his spouse. Continues to receive assistance in the home three times a week to assist with his spouse.  . Reviewed pending appointments. Encouraged to attend appointments as scheduled to prevent delays in care. Encouraged to contact chronic care management team if transportation assistance is needed. Pending follow-up evaluation with Vascular Surgery on 05/05/20.   Patient Self Care Activities related to CHF and HLD:  . Patient takes medications as prescribed . Patient attends medical appointments as scheduled . Performs ADL's independently.  Please see past updates related to this  goal by clicking on the "Past Updates" button in the selected goal           Anthony Mosley verbalized understanding of the instructions provided during the telephonic outreach today. Did not indicate  need for a printed/mailed copy of the instructions.   The care management team will follow up with Anthony Mosley within the next two weeks.   [facility] [phone]

## 2020-03-31 NOTE — Chronic Care Management (AMB) (Signed)
Chronic Care Management   Follow Up Note    03/31/2020 Name: Anthony Mosley MRN: [id number] DOB: 1935-12-15  Primary Care Provider: Hubbard Hartshorn, FNP Reason for referral : Chronic Care Management   Anthony Mosley is a 85 y.o. year old male who is a primary care patient of Hubbard Hartshorn, FNP. He is currently engaged with the chronic care management team. A routine telephonic outreach was conducted today.  Review of Anthony Mosley status, including review of consultants reports, relevant labs and test results was conducted today. Collaboration with appropriate care team members was performed as part of the comprehensive evaluation and provision of chronic care management services.      Outpatient Encounter Medications as of 03/31/2020  Medication Sig Note  . B COMPLEX VITAMINS PO Take 1 tablet by mouth daily.    . Carboxymethylcellul-Glycerin (LUBRICATING EYE DROPS OP) Apply 1 drop to eye as needed (dry eyes).    [name] diclofenac sodium (VOLTAREN) 1 % GEL Apply 4 g topically 4 (four) times daily. To the knee if needed   . Guaifenesin (MUCINEX MAXIMUM STRENGTH) 1200 MG TB12 Take 1,200 mg by mouth at bedtime. 10/04/2019: Takes qhs for congestion  . hydrocortisone 2.5 % cream    . simvastatin (ZOCOR) 40 MG tablet TAKE 1 TABLET EVERY DAY AT 6 PM    No facility-administered encounter medications on file as of 03/31/2020.      Objective:  BP Readings from Last 3 Encounters:  03/28/20 (!) 152/69  03/10/20 116/66  12/07/19 100/60   Wt Readings from Last 3 Encounters:  03/28/20 205 lb (93 kg)  03/10/20 206 lb 6 oz (93.6 kg)  12/07/19 195 lb 12 oz (88.8 kg)    Goals Addressed            This Visit's Progress   . Caregiver Stress   On track    Current Barriers:  . 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  Case Manager Clinical Goal(s):  [name] Over the next 90 days, patient will work with care management team to address needs related to caregiver  stress/management of care for spouse   Interventions:  . Evaluation of current treatment plan related to caregiver stress and patient's adherence to plan as established by provider. . Discussed plans with patient for ongoing care management follow up and provided patient with direct contact information for care management team . Discussed current plan regarding in-home assistance for his spouse. Reports pending discussion with CCM LCSW later today. He plans to pursue facility placement.   Self Care Activities: Patient serves as the primary caregiver for his spouse. . Patient's spouse is unable to perform ADL's independently . Patient's spouse is unable to perform IADL's independently   Please see past updates related to this goal by clicking on the "Past Updates" button in the selected goal      . Chronic Disease Management   On track    Current Barriers:  . Chronic Disease Management support, education, and care coordination needs related to CHF and HLD   Clinical Goal(s) related to CHF and HLD:  . Over the next 90 days, patient will monitor blood pressure and pulse daily and record readings. . Over the next 90 days, patient will weigh daily and maintain a log.  . Over the next 90 days ,patient will take all medications as prescribed. . Over the next 90 days, patient will attend all provider appointments as scheduled.  . Over the next 90  days, patient will adhere to recommended cardiac prudent/heart healthy diet.  . Over the next 90 days, patient will follow recommended safety measures to prevent falls.  Interventions related to CHF and HLD:  . Reviewed medications. Encouraged to take all medications as prescribed. Encouraged to notify provider if unable to tolerate prescribed regimen.  . Discussed blood pressure and pulse ranges along with established parameters. Encouraged to monitor daily and maintain a log. Reports one elevated reading during recent clinic visit. Reports otherwise  readings have been within range. Most recent  home reading was 118/68. [name] Reviewed s/sx of CHF complications along with indications for notifying MD. Encouraged to monitor weight and report weight gain greater than 3 pounds overnight or 5 pounds within a week. Reports occasional "mild" lower extremity edema. Denies worsening s/sx. Reports current weight of 204 lbs.  . Discussed nutritional intake and importance of adhering to cardiac prudent/heart healthy diet. . Discussed activity tolerance and provided education regarding safety precautions. Patient encouraged to practice safety measures and avoid activities that could potentially cause overexertion. Encouraged to be particularly careful when assisting and repositioning his spouse. Continues to receive assistance in the home three times a week to assist with his spouse.  . Reviewed pending appointments. Encouraged to attend appointments as scheduled to prevent delays in care. Encouraged to contact chronic care management team if transportation assistance is needed. Pending follow-up evaluation with Vascular Surgery on 05/05/20.   Patient Self Care Activities related to CHF and HLD:  . Patient takes medications as prescribed . Patient attends medical appointments as scheduled . Performs ADL's independently.  Please see past updates related to this goal by clicking on the "Past Updates" button in the selected goal         PLAN -Will follow up with Anthony Mosley within the next two weeks.    [facility] [phone]

## 2020-04-06 DIAGNOSIS — H524 Presbyopia: Secondary | ICD-10-CM | POA: Diagnosis not present

## 2020-04-06 DIAGNOSIS — H5203 Hypermetropia, bilateral: Secondary | ICD-10-CM | POA: Diagnosis not present

## 2020-04-06 DIAGNOSIS — H52209 Unspecified astigmatism, unspecified eye: Secondary | ICD-10-CM | POA: Diagnosis not present

## 2020-04-14 ENCOUNTER — Ambulatory Visit: Payer: Medicare HMO

## 2020-04-14 DIAGNOSIS — I2510 Atherosclerotic heart disease of native coronary artery without angina pectoris: Secondary | ICD-10-CM

## 2020-04-14 DIAGNOSIS — I509 Heart failure, unspecified: Secondary | ICD-10-CM

## 2020-04-14 NOTE — Chronic Care Management (AMB) (Signed)
Chronic Care Management   Follow Up Note   04/14/2020 Name: Anthony Mosley: [id number] DOB: 04/12/1935  Primary Care Provider: Hubbard Hartshorn, FNP Reason for referral : Chronic Care Management   Anthony Mosley is a 85 y.o. year old male who is a primary care patient of Anthony Hartshorn, FNP.  He is currently engaged with the chronic care management team.    Review of Anthony Mosley  status, including review of consultants reports, relevant labs and test results was conducted today. Collaboration with appropriate care team members was performed as part of the comprehensive evaluation and provision of chronic care management services.     Outpatient Encounter Medications as of 04/14/2020  Medication Sig Note  . B COMPLEX VITAMINS PO Take 1 tablet by mouth daily.    . Carboxymethylcellul-Glycerin (LUBRICATING EYE DROPS OP) Apply 1 drop to eye as needed (dry eyes).    [name] diclofenac sodium (VOLTAREN) 1 % GEL Apply 4 g topically 4 (four) times daily. To the knee if needed   . Guaifenesin (MUCINEX MAXIMUM STRENGTH) 1200 MG TB12 Take 1,200 mg by mouth at bedtime. 10/04/2019: Takes qhs for congestion  . hydrocortisone 2.5 % cream    . simvastatin (ZOCOR) 40 MG tablet TAKE 1 TABLET EVERY DAY AT 6 PM    No facility-administered encounter medications on file as of 04/14/2020.     Objective:  BP Readings from Last 3 Encounters:  03/28/20 (!) 152/69  03/10/20 116/66  12/07/19 100/60    Wt Readings from Last 3 Encounters:  03/28/20 205 lb (93 kg)  03/10/20 206 lb 6 oz (93.6 kg)  12/07/19 195 lb 12 oz (88.8 kg)     Goals Addressed            This Visit's Progress   . Caregiver Stress       Current Barriers:  . 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  Case Manager Clinical Goal(s):  [name] Over the next 90 days, patient will work with care management team to address needs related to caregiver stress/management of care for spouse   Interventions:   . Evaluation of current treatment plan related to caregiver stress and patient's adherence to plan as established by provider. . Discussed plans with patient for ongoing care management follow up and provided patient with direct contact information for care management team . Discussed current plan regarding in-home assistance for his spouse. Actively engaged with CCM LCSW. Discussing options for long term placement.    Self Care Activities: Patient serves as the primary caregiver for his spouse. . Patient's spouse is unable to perform ADL's independently . Patient's spouse is unable to perform IADL's independently   Please see past updates related to this goal by clicking on the "Past Updates" button in the selected goal      . COMPLETED: Chronic Disease Management       Current Barriers:  . Chronic Disease Management support, education, and care coordination needs related to CHF and HLD   Clinical Goal(s) related to CHF and HLD:  . Over the next 90 days, patient will monitor blood pressure and pulse daily and record readings. . Over the next 90 days, patient will weigh daily and maintain a log.  . Over the next 90 days ,patient will take all medications as prescribed. . Over the next 90 days, patient will attend all provider appointments as scheduled.  . Over the next 90 days, patient will adhere to recommended cardiac  prudent/heart healthy diet.  . Over the next 90 days, patient will follow recommended safety measures to prevent falls.  Interventions related to CHF and HLD:  . Reviewed medications. Encouraged to take all medications as prescribed. Encouraged to notify provider if unable to tolerate prescribed regimen.  . Discussed blood pressure and pulse ranges along with established parameters. Encouraged to monitor daily and maintain a log. Reports readings have been within range. . Reviewed s/sx of CHF complications along with indications for notifying MD. Encouraged to monitor  weight and report weight gain greater than 3 pounds overnight or 5 pounds within a week. Continues to weigh as recommended. Denies worsening s/sx. Reports current weight of 205 lbs.  . Discussed nutritional intake and importance of adhering to cardiac prudent/heart healthy diet. Reports good nutritional intake. [name] Discussed activity tolerance and provided education regarding safety precautions. Patient encouraged to practice safety measures and avoid activities that could potentially cause overexertion. Encouraged to be particularly careful when assisting and repositioning his spouse. Continues to receive assistance in the home (5hours) three times a week to assist with his spouse.  . Reviewed pending appointments. Continues to attend appointments as scheduled. Pending follow-up evaluation with Vascular Surgery on 05/05/20. Denies concerns regarding transportation.   Patient Self Care Activities related to CHF and HLD:  . Patient takes medications as prescribed . Patient attends medical appointments as scheduled . Follows provider's treatment recommendations. . Performs ADL's independently.  Please see past updates related to this goal by clicking on the "Past Updates" button in the selected goal         PLAN The care management team will follow up with [name] next month.   [facility] [phone]

## 2020-04-14 NOTE — Patient Instructions (Addendum)
Thank you for allowing the Chronic Care Management team to participate in your care.  Goals Addressed            This Visit's Progress   . Caregiver Stress       Current Barriers:  . 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  Case Manager Clinical Goal(s):  [name] Over the next 90 days, patient will work with care management team to address needs related to caregiver stress/management of care for spouse   Interventions:  . Evaluation of current treatment plan related to caregiver stress and patient's adherence to plan as established by provider. . Discussed plans with patient for ongoing care management follow up and provided patient with direct contact information for care management team . Discussed current plan regarding in-home assistance for his spouse. Actively engaged with CCM LCSW. Discussing options for long term placement.    Self Care Activities: Patient serves as the primary caregiver for his spouse. . Patient's spouse is unable to perform ADL's independently . Patient's spouse is unable to perform IADL's independently   Please see past updates related to this goal by clicking on the "Past Updates" button in the selected goal      . COMPLETED: Chronic Disease Management       Current Barriers:  . Chronic Disease Management support, education, and care coordination needs related to CHF and HLD   Clinical Goal(s) related to CHF and HLD:  . Over the next 90 days, patient will monitor blood pressure and pulse daily and record readings. . Over the next 90 days, patient will weigh daily and maintain a log.  . Over the next 90 days ,patient will take all medications as prescribed. . Over the next 90 days, patient will attend all provider appointments as scheduled.  . Over the next 90 days, patient will adhere to recommended cardiac prudent/heart healthy diet.  . Over the next 90 days, patient will follow recommended safety measures to prevent  falls.  Interventions related to CHF and HLD:  . Reviewed medications. Encouraged to take all medications as prescribed. Encouraged to notify provider if unable to tolerate prescribed regimen.  . Discussed blood pressure and pulse ranges along with established parameters. Encouraged to monitor daily and maintain a log. Reports readings have been within range. . Reviewed s/sx of CHF complications along with indications for notifying MD. Encouraged to monitor weight and report weight gain greater than 3 pounds overnight or 5 pounds within a week. Continues to weigh as recommended. Denies worsening s/sx. Reports current weight of 205 lbs.  . Discussed nutritional intake and importance of adhering to cardiac prudent/heart healthy diet. Reports good nutritional intake. [name] Discussed activity tolerance and provided education regarding safety precautions. Patient encouraged to practice safety measures and avoid activities that could potentially cause overexertion. Encouraged to be particularly careful when assisting and repositioning his spouse. Continues to receive assistance in the home (5hours) three times a week to assist with his spouse.  . Reviewed pending appointments. Continues to attend appointments as scheduled. Pending follow-up evaluation with Vascular Surgery on 05/05/20. Denies concerns regarding transportation.   Patient Self Care Activities related to CHF and HLD:  . Patient takes medications as prescribed . Patient attends medical appointments as scheduled . Follows provider's treatment recommendations. . Performs ADL's independently.  Please see past updates related to this goal by clicking on the "Past Updates" button in the selected goal         Anthony Mosley verbalized understanding  of the instructions provided during the telephonic outreach today. Declined need for mailed/printed instructions.   The care management team will follow up with Anthony Mosley next month.    [facility] [phone]

## 2020-05-03 NOTE — Progress Notes (Signed)
Patient ID: Anthony Mosley, male    DOB: 04-14-1935, 85 y.o.   MRN: [id number]  PCP: Anthony Malkin, MD  Chief Complaint  Patient presents with  . Follow-up  . Hyperlipidemia  . Coronary artery disese  . Cardiomyopathy    Subjective:   Anthony Mosley is a 85 y.o. male, presents to clinic with CC of the following:  Chief Complaint  Patient presents with  . Follow-up  . Hyperlipidemia  . Coronary artery disese  . Cardiomyopathy    HPI:  Patient is an 85 year old male patient of Anthony Mosley Recent communication with the chronic care management team on 04/14/2020 noted the following: He is the primary caregiver for his spouse with minimal support, with his spouse unable to perform ADLs independently.  Was getting assistance approximately 3 times a week to help, with long-term placement options noted discussed.   He more recently messaged requesting a note stating that his wife's medical condition makes her unable to take care of herself as he is trying to get her placed in a home as his health is limiting his ability to care for her presently. Court hearing on Jun 8.  He recently saw vascular surgery on referral on 03/28/2020 for claudication concerns.   He was noted to have a thoracic aneurysm -4.3 cm in the ascending aorta, felt to be stable. Noninvasive studies planned to be initiated in the near future.  He noted that he had not done anything as of yet as he awaits his follow-up He has a follow-up on 05/05/2020 (tomorrow)  He last saw cardiology on 03/10/2020 with their assessment/plan as follows: 1. Coronary artery disease, non-occlusive Mild to moderate nonobstructive CAD involving RCA, mid LAD, myocardial bridging otherwise no significant CAD involving left coronary artery.  Patient denies any recent anginal or exertional symptoms.  Continue Zocor 40 mg daily.  2. PAF (paroxysmal atrial fibrillation) ([place]) Last EKG on 12/07/2019 showed atrial  fibrillation with a slow rate of 58.  EKG today shows sinus bradycardia left axis deviation, nonspecific interventricular block, nonspecific T wave abnormality rate of 44.  Patient is asymptomatic.  Patient is on no AV nodal blocking agents.  Will follow serial EKGs.  3. Cardiomyopathy, unspecified type ([place]) Most recent echocardiogram November 23, 2019 showed an EF of 45 to 50%.  Mildly increased LVH, grade 1 diastolic dysfunction, LV demonstrates global hypokinesis, mild to moderate MR, moderate TR, mild dilation of aortic root and ascending aorta of 40 mm.   4. Thoracic aortic aneurysm without rupture ([place]) Stable ascending thoracic aorta aneurysmon CTA 12/28/2019 Will follow with yearly CTAs to check for changes.  5.  Claudication Patient complaining of calf Mosley when he walks to his mailbox which is relieved after returning to his home and rests.  He also complains of not a veins in both legs.  He is concerned he may have peripheral arterial disease like his wife.  She has had an amputation in the past.  Refer to Dr. Leotis Mosley for evaluation  The last visit with Anthony Mosley was in December 2020 with issues followed including: CHF/Hx cardiomyopathy/PAF/HxBLE Edema: Seeing Cardiology, last follow-up 03/10/2020 He notes mild BLE edema that is dependent, and he thinks has been a little better here in the more recent past; denies chest Mosley, or increasing shortness of breath.  He is no longer taking lasix daily, only rarely prn    CAD/Hyperlipidemia: Taking simvastatin and tolerating well;  Lab Results  Component Value Date  CHOL 140 08/13/2019   HDL 52 08/13/2019   LDLCALC 75 08/13/2019   TRIG 52 08/13/2019   CHOLHDL 2.7 08/13/2019   no chest Mosley, shortness of breath, or myalgias.  Seeing Cardiology as above  Anemia/B12 deficiency: Had low iron and low B12 prior, last CBC was stable. Taking Vitamin B12 and iron supplement with iron panel and B12/folate levels normal in January 2021 Lab  Results  Component Value Date   WBC 6.7 12/21/2019   HGB 14.0 12/21/2019   HCT 40.7 12/21/2019   MCV 94.2 12/21/2019   PLT 174 12/21/2019    Gilbert Syndrome: Bilirubin has been fairly stable,   Prediabetes:  Last A1c was okay. Checks sugars at home, in 80's this am and checks about monthly, always good Lab Results  Component Value Date   HGBA1C 5.6 12/21/2019    Urinary Sx's/Prostate CA screen: Notes decreased force of urine stream, some nocturia over the last few months (2X/night on average noted).  Some hesitancy, PSAs have been stable Lab Results  Component Value Date   PSA 1.0 12/21/2019   PSA 0.6 12/03/2018   PSA 2.6 04/07/2017   CKD Stage 3: Has CKD,  Lab Results  Component Value Date   CREATININE 1.34 (H) 12/21/2019   BUN 23 12/21/2019   NA 140 12/21/2019   K 4.5 12/21/2019   CL 100 12/21/2019   CO2 35 (H) 12/21/2019    Athritis of Knees: Saw ortho in past, last visit in July 2020.  Had injections in the past which seemed to help.  Has Voltaren gel to use topically, and helps, was considering TKR. Notes was working out in the garden this morning, and still able to do things presently.  Tobacco-former smoker Alcohol-denies use  Patient Active Problem List   Diagnosis Date Noted  . Mosley in limb 03/28/2020  . PAF (paroxysmal atrial fibrillation) ([place]) 12/14/2019  . Asymptomatic varicose veins of both lower extremities 08/16/2019  . Iron deficiency 08/16/2019  . Anemia 08/13/2019  . Vitamin B12 deficiency 03/24/2019  . Congestive heart failure (CHF) ([place]) 02/22/2019  . Right inguinal hernia 01/21/2019  . Multifocal atrial tachycardia ([place]) 11/11/2018  . Bilateral leg edema 11/11/2018  . Splenic laceration, subsequent encounter 11/10/2018  . Hemoperitoneum   . Near syncope   . Splenic rupture 10/29/2018  . Prediabetes 08/27/2018  . Gilbert syndrome 05/13/2018  . Benign neoplasm of ascending colon   . Rectal polyp   . Benign neoplasm of descending  colon   . Positive colorectal cancer screening using Cologuard test 08/07/2017  . Coronary artery disease involving native coronary artery of native heart without angina pectoris 07/02/2017  . Thoracic aortic aneurysm without rupture ([place]) 07/02/2017  . Cardiomyopathy ([place]) 06/13/2017  . Abnormal EKG 04/24/2017  . Dyspnea on exertion 04/24/2017  . Encounter for follow-up surveillance of skin cancer 09/04/2016  . Eyelashes turned in 09/04/2016  . Hyperbilirubinemia 05/02/2016  . Benign fibroma of prostate 10/04/2015  . Atherosclerosis of coronary artery 10/04/2015  . Gonalgia 10/04/2015  . Degeneration of intervertebral disc of lumbar region 10/04/2015  . Dyslipidemia 10/04/2015  . Failure of erection 10/04/2015  . Hemorrhoid 10/04/2015  . Bilirubinemia 10/04/2015  . Blood glucose elevated 10/04/2015  . Keratosis 10/04/2015  . Stage 3 chronic kidney disease 10/04/2015  . Long Q-T syndrome 10/04/2015  . Arthritis of knee, degenerative 10/04/2015  . Arthritis of shoulder region, degenerative 10/04/2015  . Scalp tenderness 10/04/2015  . Skin lesion 10/04/2015  . Scrotal varicose veins 10/04/2015  .  Cataract of both eyes 10/04/2015  . Osteoarthritis of left knee 03/31/2015      Current Outpatient Medications:  .  B COMPLEX VITAMINS PO, Take 1 tablet by mouth daily. , Disp: , Rfl:  .  Carboxymethylcellul-Glycerin (LUBRICATING EYE DROPS OP), Apply 1 drop to eye as needed (dry eyes). , Disp: , Rfl:  .  diclofenac sodium (VOLTAREN) 1 % GEL, Apply 4 g topically 4 (four) times daily. To the knee if needed, Disp: 100 g, Rfl: 2 .  Guaifenesin (MUCINEX MAXIMUM STRENGTH) 1200 MG TB12, Take 1,200 mg by mouth at bedtime., Disp: , Rfl:  .  hydrocortisone 2.5 % cream, , Disp: , Rfl:  .  simvastatin (ZOCOR) 40 MG tablet, TAKE 1 TABLET EVERY DAY AT 6 PM, Disp: 90 tablet, Rfl: 3   Allergies  Allergen Reactions  . Macrolides And Ketolides Other (See Comments)    Prolonged Q-T syndrome  .  Norvasc [Amlodipine] Other (See Comments)    Bradycardia  . Avodart [Dutasteride] Itching  . Tape Rash     Past Surgical History:  Procedure Laterality Date  . APPENDECTOMY     Dr Emilio Math  . CARDIAC CATHETERIZATION  2005  . CARDIAC CATHETERIZATION  2002  . CATARACT EXTRACTION W/PHACO Right 10/24/2015   Procedure: CATARACT EXTRACTION PHACO AND INTRAOCULAR LENS PLACEMENT (IOC);  Surgeon: [name], MD;  Location: [facility];  Service: Ophthalmology;  Laterality: Right;  [place] 00:52 AP% 23.5 CDE 12.35 fluid pack lot # [id number]  . CATARACT EXTRACTION W/PHACO Left 11/14/2015   Procedure: CATARACT EXTRACTION PHACO AND INTRAOCULAR LENS PLACEMENT (IOC);  Surgeon: [name], MD;  Location: [facility];  Service: Ophthalmology;  Laterality: Left;  [place] 00:55 AP% 19.1 CDE 10.66 fluid pack lot # [id number]  . COLONOSCOPY  1987  . COLONOSCOPY WITH PROPOFOL N/A 08/22/2017   Procedure: COLONOSCOPY WITH PROPOFOL;  Surgeon: [name], MD;  Location: [place];  Service: Gastroenterology;  Laterality: N/A;  . EYE SURGERY    . HERNIA REPAIR     umbilical/ Dr Emilio Math  . INGUINAL HERNIA REPAIR Right 02/04/2019   Procedure: HERNIA REPAIR INGUINAL WITH MESH;  Surgeon: Olean Ree, MD;  Location: [facility];  Service: General;  Laterality: Right;  . IR ANGIOGRAM VISCERAL SELECTIVE  10/29/2018  . IR EMBO ART  VEN HEMORR LYMPH EXTRAV  INC GUIDE ROADMAPPING  10/29/2018  . IR THORACENTESIS ASP PLEURAL SPACE W/IMG GUIDE  11/23/2018  . IR US GUIDE VASC ACCESS RIGHT  10/29/2018  . KNEE ARTHROSCOPY Bilateral   . POLYPECTOMY  08/22/2017   Procedure: POLYPECTOMY;  Surgeon: [name], MD;  Location: [place];  Service: Gastroenterology;;  . RIGHT/LEFT HEART CATH AND CORONARY ANGIOGRAPHY N/A 06/13/2017   Procedure: Right/Left Heart Cath and Coronary Angiography;  Surgeon: [name], MD;  Location: [facility];  Service: Cardiovascular;  Laterality: N/A;  . TONSILLECTOMY        Family History  Problem Relation Age of Onset  . Alzheimer's disease Mother   . Pulmonary embolism Father   . Diabetes Father   . Heart attack Father 54  . Diabetes Brother   . Heart disease Brother        CABG  . Heart disease Brother        CABG  . Diabetes Brother   . Healthy Brother      Social History   Tobacco Use  . Smoking status: Former Smoker    Packs/day: 1.50    Years: 20.00  Pack years: 30.00    Types: Cigarettes    Quit date: 65    Years since quitting: 34.4  . Smokeless tobacco: Former Systems developer    Types: Chew    Quit date: 2000  . Tobacco comment: smoking cessation materials not required  Substance Use Topics  . Alcohol use: No    Alcohol/week: 0.0 standard drinks    With staff assistance, above reviewed with the patient today.  ROS: As per HPI, notes his vision has been okay with his glasses, both he and his wife have had the Covid vaccine, occasionally gets some right groin discomfort where he had a hernia repair in the past, although not marked and no bulges or concerns for another hernia.  Otherwise no specific complaints on a limited and focused system review   No results found for this or any previous visit (from the past 72 hour(s)).   PHQ2/9: Depression screen [facility] 2/9 05/04/2020 04/14/2020 12/14/2019 12/13/2019 08/13/2019  Decreased Interest 0 0 0 0 0  Down, Depressed, Hopeless 1 1 0 0 0  PHQ - 2 Score 1 1 0 0 0  Altered sleeping 1 - 0 - 0  Tired, decreased energy 1 - 0 - 0  Change in appetite 0 - 0 - 0  Feeling bad or failure about yourself  0 - 0 - 0  Trouble concentrating 0 - 0 - 0  Moving slowly or fidgety/restless 0 - 0 - 0  Suicidal thoughts 0 - 0 - 0  PHQ-9 Score 3 - 0 - 0  Difficult doing work/chores Not difficult at all - - - Not difficult at all  Some recent data might be hidden   PHQ-2/9 Result reviewed  Fall Risk: Fall Risk  05/04/2020 12/14/2019 12/13/2019 10/04/2019 08/13/2019  Falls in the past year? 1 0 0 1 0   Number falls in past yr: 0 0 - 0 0  Comment - - - patient states "no falls since back in the winter" -  Injury with Fall? 0 0 - - 0  Risk for fall due to : - - - - -  Risk for fall due to: Comment - - - - -  Follow up - - Falls prevention discussed - -      Objective:   Vitals:   05/04/20 1320  BP: 110/70  Pulse: 73  Resp: 16  Temp: 98.3 F (36.8 C)  TempSrc: Temporal  SpO2: 98%  Weight: 200 lb (90.7 kg)  Height: 6\' 1"  (1.854 m)    Body mass index is 26.39 kg/m.  Physical Exam   NAD, masked, pleasant HEENT - [place]/AT, sclera anicteric, positive glasses, PERRL, EOMI, conj - non-inj'ed, TM's and canals clear, pharynx clear Neck - supple, no adenopathy, no JVD, carotids 2+ and = without bruits bilat Car -regular rhythm with fairly frequent ectopic beats heard, not irregularly irregular, no S3 noted Pulm- RR and effort normal at rest, CTA without wheeze or rales Abd - soft, NT, ND, BS+,  no masses Back - no CVA tenderness  Ext - trace right LE edema, no left lower extremity edema,  Neuro/psychiatric - affect was not flat, appropriate with conversation  Alert and oriented  Grossly non-focal - good strength on testing extremities, sensation intact to LT in distal extremities  Speech normal   Results for orders placed or performed in visit on 12/14/19  PSA  Result Value Ref Range   PSA 1.0 < OR = 4.0 ng/mL  Hemoglobin A1c  Result Value Ref  Range   Hgb A1c MFr Bld 5.6 <5.7 % of total Hgb   Mean Plasma Glucose 114 (calc)   eAG (mmol/L) 6.3 (calc)  COMPLETE METABOLIC PANEL WITH GFR  Result Value Ref Range   Glucose, Bld 101 (H) 65 - 99 mg/dL   BUN 23 7 - 25 mg/dL   Creat 1.34 (H) 0.70 - 1.11 mg/dL   GFR, Est Non African American 49 (L) > OR = 60 mL/min/1.73m2   GFR, Est African American 57 (L) > OR = 60 mL/min/1.39m2   BUN/Creatinine Ratio 17 6 - 22 (calc)   Sodium 140 135 - 146 mmol/L   Potassium 4.5 3.5 - 5.3 mmol/L   Chloride 100 98 - 110 mmol/L   CO2 35 (H) 20 -  32 mmol/L   Calcium 9.4 8.6 - 10.3 mg/dL   Total Protein 6.6 6.1 - 8.1 g/dL   Albumin 4.2 3.6 - 5.1 g/dL   Globulin 2.4 1.9 - 3.7 g/dL (calc)   AG Ratio 1.8 1.0 - 2.5 (calc)   Total Bilirubin 1.8 (H) 0.2 - 1.2 mg/dL   Alkaline phosphatase (APISO) 55 35 - 144 U/L   AST 18 10 - 35 U/L   ALT 9 9 - 46 U/L  CBC  Result Value Ref Range   WBC 6.7 3.8 - 10.8 Thousand/uL   RBC 4.32 4.20 - 5.80 Million/uL   Hemoglobin 14.0 13.2 - 17.1 g/dL   HCT 40.7 38.5 - 50.0 %   MCV 94.2 80.0 - 100.0 fL   MCH 32.4 27.0 - 33.0 pg   MCHC 34.4 32.0 - 36.0 g/dL   RDW 11.9 11.0 - 15.0 %   Platelets 174 140 - 400 Thousand/uL   MPV 10.3 7.5 - 12.5 fL  Iron, TIBC and Ferritin Panel  Result Value Ref Range   Iron 113 50 - 180 mcg/dL   TIBC 273 250 - 425 mcg/dL (calc)   %SAT 41 20 - 48 % (calc)   Ferritin 214 24 - 380 ng/mL  B12 and Folate Panel  Result Value Ref Range   Vitamin B-12 407 200 - 1,100 pg/mL   Folate >24.0 ng/mL       Assessment & Plan:   1. Congestive heart failure, unspecified HF chronicity, unspecified heart failure type [facility]) Continue follow-up with cardiology  2. Coronary artery disease involving native coronary artery of native heart without angina pectoris Continues to follow with cardiology  3. Cardiomyopathy, unspecified type [facility]) Continue follow-up with cardiology  4. PAF (paroxysmal atrial fibrillation) (H[facility]) Patient's rhythm today clinically not in A. fib, was not irregularly irregular.  Continue to follow with cardiology  5. Thoracic aortic aneurysm without rupture [facility]) Plans for yearly CT scans to follow  6. Claudication [facility] Keep follow-up appointment with vascular tomorrow, await their further input.  7. Dyslipidemia Last lipid check was good.  Continue the statin product  8. Anemia of chronic disease likely Last CBC was good, as was the B12 level and folate, and also his iron panel was normal  9. Rosanna Randy syndrome Has been stable in the recent  past.  10. Prediabetes Sugars have been very well controlled recently, and he does still check periodically at home.  11. Stage 3 chronic kidney disease, unspecified whether stage 3a or 3b CKD Continuing to monitor presently.  12. Lower urinary tract symptoms (LUTS) Noted some medications that sometimes are tried to help with the symptoms, which include urinary frequency overnight, hesitancy.  His PSAs have continued to be good. Noted  some of the side effects of these medications, and we will hold off adding anything presently.  13. Arthritis of right knee Discussed follow-up with orthopedics again if her symptoms are getting more problematic, and he stated if they do, he would do so and consider potential total joint replacement at that time.  He had his wife with him today for her assessment, and I do agree with him that for his health and wellbeing, getting more help is needed, and he is presently trying to help get her placed in an extended care facility.  Chronic care management team has been involved to help.  A note was written for him to help with his being able to make decisions for her. We will follow-up again in 3 months time, sooner as needed.  Informed will likely need to recheck some labs at that time.    Anthony Malkin, MD 05/04/20 1:28 PM

## 2020-05-04 ENCOUNTER — Ambulatory Visit (INDEPENDENT_AMBULATORY_CARE_PROVIDER_SITE_OTHER): Payer: Medicare HMO | Admitting: Internal Medicine

## 2020-05-04 ENCOUNTER — Encounter: Payer: Self-pay | Admitting: Internal Medicine

## 2020-05-04 ENCOUNTER — Other Ambulatory Visit: Payer: Self-pay

## 2020-05-04 VITALS — BP 110/70 | HR 73 | Temp 98.3°F | Resp 16 | Ht 73.0 in | Wt 200.0 lb

## 2020-05-04 DIAGNOSIS — M1711 Unilateral primary osteoarthritis, right knee: Secondary | ICD-10-CM | POA: Insufficient documentation

## 2020-05-04 DIAGNOSIS — I712 Thoracic aortic aneurysm, without rupture, unspecified: Secondary | ICD-10-CM

## 2020-05-04 DIAGNOSIS — I509 Heart failure, unspecified: Secondary | ICD-10-CM | POA: Diagnosis not present

## 2020-05-04 DIAGNOSIS — R7303 Prediabetes: Secondary | ICD-10-CM

## 2020-05-04 DIAGNOSIS — I2510 Atherosclerotic heart disease of native coronary artery without angina pectoris: Secondary | ICD-10-CM | POA: Diagnosis not present

## 2020-05-04 DIAGNOSIS — N183 Chronic kidney disease, stage 3 unspecified: Secondary | ICD-10-CM

## 2020-05-04 DIAGNOSIS — R399 Unspecified symptoms and signs involving the genitourinary system: Secondary | ICD-10-CM

## 2020-05-04 DIAGNOSIS — I739 Peripheral vascular disease, unspecified: Secondary | ICD-10-CM | POA: Diagnosis not present

## 2020-05-04 DIAGNOSIS — I429 Cardiomyopathy, unspecified: Secondary | ICD-10-CM

## 2020-05-04 DIAGNOSIS — I480 Paroxysmal atrial fibrillation: Secondary | ICD-10-CM | POA: Diagnosis not present

## 2020-05-04 DIAGNOSIS — D649 Anemia, unspecified: Secondary | ICD-10-CM | POA: Diagnosis not present

## 2020-05-04 DIAGNOSIS — E785 Hyperlipidemia, unspecified: Secondary | ICD-10-CM

## 2020-05-05 ENCOUNTER — Ambulatory Visit (INDEPENDENT_AMBULATORY_CARE_PROVIDER_SITE_OTHER): Payer: Medicare HMO | Admitting: Vascular Surgery

## 2020-05-05 ENCOUNTER — Encounter (INDEPENDENT_AMBULATORY_CARE_PROVIDER_SITE_OTHER): Payer: Self-pay | Admitting: Vascular Surgery

## 2020-05-05 ENCOUNTER — Ambulatory Visit (INDEPENDENT_AMBULATORY_CARE_PROVIDER_SITE_OTHER): Payer: Medicare HMO

## 2020-05-05 VITALS — BP 121/71 | HR 50 | Ht 73.0 in | Wt 204.0 lb

## 2020-05-05 DIAGNOSIS — R0609 Other forms of dyspnea: Secondary | ICD-10-CM

## 2020-05-05 DIAGNOSIS — N183 Chronic kidney disease, stage 3 unspecified: Secondary | ICD-10-CM | POA: Diagnosis not present

## 2020-05-05 DIAGNOSIS — M79605 Pain in left leg: Secondary | ICD-10-CM | POA: Diagnosis not present

## 2020-05-05 DIAGNOSIS — M79604 Pain in right leg: Secondary | ICD-10-CM

## 2020-05-05 DIAGNOSIS — R0600 Dyspnea, unspecified: Secondary | ICD-10-CM

## 2020-05-05 DIAGNOSIS — I712 Thoracic aortic aneurysm, without rupture, unspecified: Secondary | ICD-10-CM

## 2020-05-05 NOTE — Assessment & Plan Note (Signed)
I think his pain is likely neuropathic in nature at this point.  It is not due to arterial insufficiency.  We will see him back as needed.

## 2020-05-05 NOTE — Progress Notes (Signed)
MRN : [id number]  Anthony Mosley is a 85 y.o. ([birth year]) male who presents with chief complaint of  Chief Complaint  Patient presents with  . Follow-up    U/s [place] up  .  History of Present Illness: Patient returns today in follow up of with his noninvasive studies today.  He still has some pain in his legs particularly when he gets up from a seated position and does certain activities.  No new ulceration or infection.  ABIs today are 1.5 bilaterally with brisk triphasic waveforms and normal digital pressures consistent with no arterial insufficiency.  Current Outpatient Medications  Medication Sig Dispense Refill  . B COMPLEX VITAMINS PO Take 1 tablet by mouth daily.     . Carboxymethylcellul-Glycerin (LUBRICATING EYE DROPS OP) Apply 1 drop to eye as needed (dry eyes).     [name] diclofenac sodium (VOLTAREN) 1 % GEL Apply 4 g topically 4 (four) times daily. To the knee if needed 100 g 2  . Guaifenesin (MUCINEX MAXIMUM STRENGTH) 1200 MG TB12 Take 1,200 mg by mouth at bedtime.    . hydrocortisone 2.5 % cream     . simvastatin (ZOCOR) 40 MG tablet TAKE 1 TABLET EVERY DAY AT 6 PM 90 tablet 3   No current facility-administered medications for this visit.    Past Medical History:  Diagnosis Date  . Arthritis   . 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 seen on CT.  [name] CAD (coronary artery disease)    a.  Elective R/LHC 06/12/18 showed mild-moderate nonobstructive RCA CAD with mild LAD myocardial bridging but no significant obstructive CAD; there was mildly elevated L heart filling pressure, moderately elevated R heart filling pressure, and mild pulm HTN.  . Cancer ([place])    SKIN  . Chronic knee pain   . Cough   . Edema    FEET/LEGS  . Former tobacco use   . Hemorrhoids   . Hyperbilirubinemia   . Hyperlipidemia   . Mild aortic insufficiency   . Mild pulmonary hypertension ([place])   . Moderate mitral regurgitation   . NICM (nonischemic cardiomyopathy) ([place])    a. EF 40-45% by echo 05/2017.    [name] Pleural effusion, left 11/2018  . Pneumonia   . PONV (postoperative nausea and vomiting)    after first cataract  . Rosacea   . Ruptured spleen   . Thoracic aortic aneurysm ([place])    a. 4.4cm by CT 12/2017, imaged incompletely at 4.3cm in 10/2018 CT.  [name] Wears dentures    partial upper  . Wheezing     Past Surgical History:  Procedure Laterality Date  . APPENDECTOMY     Dr Emilio Math  . CARDIAC CATHETERIZATION  2005  . CARDIAC CATHETERIZATION  2002  . CATARACT EXTRACTION W/PHACO Right 10/24/2015   Procedure: CATARACT EXTRACTION PHACO AND INTRAOCULAR LENS PLACEMENT (IOC);  Surgeon: [name], MD;  Location: [facility];  Service: Ophthalmology;  Laterality: Right;  [place] 00:52 AP% 23.5 CDE 12.35 fluid pack lot # [id number]  . CATARACT EXTRACTION W/PHACO Left 11/14/2015   Procedure: CATARACT EXTRACTION PHACO AND INTRAOCULAR LENS PLACEMENT (IOC);  Surgeon: [name], MD;  Location: [facility];  Service: Ophthalmology;  Laterality: Left;  [place] 00:55 AP% 19.1 CDE 10.66 fluid pack lot # [id number]  . COLONOSCOPY  1987  . COLONOSCOPY WITH PROPOFOL N/A 08/22/2017   Procedure: COLONOSCOPY WITH PROPOFOL;  Surgeon: [name], MD;  Location: [facility];  Service: Gastroenterology;  Laterality: N/A;  . EYE  SURGERY    . HERNIA REPAIR     umbilical/ Dr Emilio Math  . INGUINAL HERNIA REPAIR Right 02/04/2019   Procedure: HERNIA REPAIR INGUINAL WITH MESH;  Surgeon: Olean Ree, MD;  Location: [facility];  Service: General;  Laterality: Right;  . IR ANGIOGRAM VISCERAL SELECTIVE  10/29/2018  . IR EMBO ART  VEN HEMORR LYMPH EXTRAV  INC GUIDE ROADMAPPING  10/29/2018  . IR THORACENTESIS ASP PLEURAL SPACE W/IMG GUIDE  11/23/2018  . IR US GUIDE VASC ACCESS RIGHT  10/29/2018  . KNEE ARTHROSCOPY Bilateral   . POLYPECTOMY  08/22/2017   Procedure: POLYPECTOMY;  Surgeon: [name], MD;  Location: [facility];  Service: Gastroenterology;;  . RIGHT/LEFT HEART CATH AND CORONARY ANGIOGRAPHY N/A  06/13/2017   Procedure: Right/Left Heart Cath and Coronary Angiography;  Surgeon: [name], MD;  Location: [facility];  Service: Cardiovascular;  Laterality: N/A;  . TONSILLECTOMY       Social History   Tobacco Use  . Smoking status: Former Smoker    Packs/day: 1.50    Years: 20.00    Pack years: 30.00    Types: Cigarettes    Quit date: 1987    Years since quitting: 34.4  . Smokeless tobacco: Former Systems developer    Types: Chew    Quit date: 2000  . Tobacco comment: smoking cessation materials not required  Substance Use Topics  . Alcohol use: No    Alcohol/week: 0.0 standard drinks  . Drug use: No      Family History  Problem Relation Age of Onset  . Alzheimer's disease Mother   . Pulmonary embolism Father   . Diabetes Father   . Heart attack Father 59  . Diabetes Brother   . Heart disease Brother        CABG  . Heart disease Brother        CABG  . Diabetes Brother   . Healthy Brother      Allergies  Allergen Reactions  . Macrolides And Ketolides Other (See Comments)    Prolonged Q-T syndrome  . Norvasc [Amlodipine] Other (See Comments)    Bradycardia  . Avodart [Dutasteride] Itching  . Tape Rash    REVIEW OF SYSTEMS (Negative unless checked)  Constitutional: [] ?Weight loss  [] ?Fever  [] ?Chills Cardiac: [] ?Chest pain   [] ?Chest pressure   [x] ?Palpitations   [] ?Shortness of breath when laying flat   [x] ?Shortness of breath at rest   [x] ?Shortness of breath with exertion. Vascular:  [] ?Pain in legs with walking   [] ?Pain in legs at rest   [] ?Pain in legs when laying flat   [] ?Claudication   [] ?Pain in feet when walking  [] ?Pain in feet at rest  [] ?Pain in feet when laying flat   [] ?History of DVT   [] ?Phlebitis   [] ?Swelling in legs   [] ?Varicose veins   [] ?Non-healing ulcers Pulmonary:   [] ?Uses home oxygen   [] ?Productive cough   [] ?Hemoptysis   [] ?Wheeze  [x] ?COPD   [] ?Asthma Neurologic:  [x] ?Dizziness  [] ?Blackouts   [] ?Seizures   [] ?History of  stroke   [] ?History of TIA  [] ?Aphasia   [] ?Temporary blindness   [] ?Dysphagia   [] ?Weakness or numbness in arms   [] ?Weakness or numbness in legs Musculoskeletal:  [x] ?Arthritis   [] ?Joint swelling   [x] ?Joint pain   [] ?Low back pain Hematologic:  [] ?Easy bruising  [] ?Easy bleeding   [x] ?Hypercoagulable state   [] ?Anemic  [] ?Hepatitis Gastrointestinal:  [] ?Blood in stool   [] ?Vomiting blood  [] ?Gastroesophageal reflux/heartburn   [] ?Abdominal  pain Genitourinary:  [] ?Chronic kidney disease   [] ?Difficult urination  [] ?Frequent urination  [] ?Burning with urination   [] ?Hematuria Skin:  [] ?Rashes   [] ?Ulcers   [] ?Wounds Psychological:  [] ?History of anxiety   [] ? History of major depression.  Physical Examination  BP 121/71   Pulse (!) 50   Ht 6\' 1"  (1.854 m)   Wt 204 lb (92.5 kg)   BMI 26.91 kg/m  Gen:  WD/WN, NAD Head: /AT, No temporalis wasting. Ear/Nose/Throat: Hearing grossly intact, nares w/o erythema or drainage Eyes: Conjunctiva clear. Sclera non-icteric Neck: Supple.  Trachea midline Pulmonary:  Good air movement, no use of accessory muscles.  Cardiac: RRR, no JVD Vascular:  Vessel Right Left  Radial Palpable Palpable           Musculoskeletal: M/S 5/5 throughout.  No deformity or atrophy. No edema. Neurologic: Sensation grossly intact in extremities.  Symmetrical.  Speech is fluent.  Psychiatric: Judgment intact, Mood & affect appropriate for pt's clinical situation. Dermatologic: No rashes or ulcers noted.  No cellulitis or open wounds.       Labs No results found for this or any previous visit (from the past 2160 hour(s)).  Radiology No results found.  Assessment/Plan Thoracic aortic aneurysm without rupture ([place]) I reviewed his CT scan from earlier this year, and this was 4.3 cm in the ascending aorta and does not represent an immediate problem.  This has been followed regularly with radiologic studies.  Dyslipidemia lipid control important in reducing  the progression of atherosclerotic disease. Continue statin therapy   Stage 3 chronic kidney disease Plan noninvasive studies to assess his perfusion initially prior to considering any contrast based media.  No immediate limb threatening symptoms.  Pain in limb I think his pain is likely neuropathic in nature at this point.  It is not due to arterial insufficiency.  We will see him back as needed.    Leotis Pain, MD  05/05/2020 10:44 AM    This note was created with Dragon medical transcription system.  Any errors from dictation are purely unintentional

## 2020-05-10 ENCOUNTER — Telehealth: Payer: Medicare HMO

## 2020-05-10 ENCOUNTER — Ambulatory Visit: Payer: Self-pay

## 2020-05-11 NOTE — Chronic Care Management (AMB) (Signed)
  Chronic Care Management   Outreach Note   Name: Anthony Mosley MRN: [id number] DOB: 1935-07-05  Primary Care Provider: Towanda Malkin, MD Reason for referral : Chronic Care Management    An unsuccessful telephone outreach was attempted today. Anthony Mosley is currently engaged with the chronic care management team.   A HIPAA compliant voice message was left today requesting a return call.    Follow Up Plan: The care management team will reach out to Anthony Mosley again within the next two to three weeks.   [facility] [phone]

## 2020-05-26 ENCOUNTER — Ambulatory Visit: Payer: Self-pay

## 2020-05-26 ENCOUNTER — Telehealth: Payer: Medicare HMO

## 2020-05-26 NOTE — Chronic Care Management (AMB) (Signed)
  Chronic Care Management   Outreach Note  05/26/2020 Name: Anthony Mosley MRN: [id number] DOB: 02-12-1935   Primary Care Provider: Towanda Malkin, MD Reason for referral : Chronic Care Management   An unsuccessful telephone outreach was attempted today. [name] is currently engaged with the chronic care management team. A routine outreach was attempted today.  Phone rang multiples times without option to leave a voice message.   Follow Up Plan Will attempt outreach again within the next two weeks.    [facility] Care [facility] [phone]

## 2020-06-06 ENCOUNTER — Other Ambulatory Visit: Payer: Self-pay

## 2020-06-06 ENCOUNTER — Ambulatory Visit: Payer: Medicare HMO | Admitting: Dermatology

## 2020-06-06 DIAGNOSIS — S30861A Insect bite (nonvenomous) of abdominal wall, initial encounter: Secondary | ICD-10-CM | POA: Diagnosis not present

## 2020-06-06 DIAGNOSIS — W57XXXA Bitten or stung by nonvenomous insect and other nonvenomous arthropods, initial encounter: Secondary | ICD-10-CM | POA: Diagnosis not present

## 2020-06-06 MED ORDER — CLOBETASOL PROP EMOLLIENT BASE 0.05 % EX CREA: TOPICAL_CREAM | CUTANEOUS | 0 refills | Status: DC

## 2020-06-06 NOTE — Progress Notes (Signed)
° °  Follow-Up Visit   Subjective  Anthony Mosley is a 85 y.o. male who presents for the following: Rash.  Patient here today for rash that has come up on abdomen overnight. He mowed the yard yesterday. Spots have gotten more red and have spread, started off itching but is now getting tender to touch.   The following portions of the chart were reviewed this encounter and updated as appropriate:      Review of Systems:  No other skin or systemic complaints except as noted in HPI or Assessment and Plan.  Objective  Well appearing patient in no apparent distress; mood and affect are within normal limits.  A focused examination was performed including abdomen. Relevant physical exam findings are noted in the Assessment and Plan.  Objective  Abdomen: Pink edematous papules with vesiculation on abdomen, bilateral   Assessment & Plan  Insect bite of abdomen with local reaction, initial encounter Abdomen  Vs early contact dermatitis from poison ivy, doubt shingles since it is bilateral Start clobetasol 0.05% cream to affected areas twice a day for 1-2 weeks as needed.  Topical steroids (such as triamcinolone, fluocinolone, fluocinonide, mometasone, clobetasol, halobetasol, betamethasone, hydrocortisone) can cause thinning and lightening of the skin if they are used for too long in the same area. Your physician has selected the right strength medicine for your problem and area affected on the body. Please use your medication only as directed by your physician to prevent side effects.    Ordered Medications: Clobetasol Prop Emollient Base (CLOBETASOL PROPIONATE E) 0.05 % emollient cream  Return if symptoms worsen or fail to improve.  Graciella Belton, RMA, am acting as scribe for Brendolyn Patty, MD .  Documentation: I have reviewed the above documentation for accuracy and completeness, and I agree with the above.  Brendolyn Patty MD

## 2020-06-06 NOTE — Patient Instructions (Signed)
Topical steroids (such as triamcinolone, fluocinolone, fluocinonide, mometasone, clobetasol, halobetasol, betamethasone, hydrocortisone) can cause thinning and lightening of the skin if they are used for too long in the same area. Your physician has selected the right strength medicine for your problem and area affected on the body. Please use your medication only as directed by your physician to prevent side effects.   . 

## 2020-06-09 ENCOUNTER — Telehealth: Payer: Medicare HMO

## 2020-06-16 ENCOUNTER — Telehealth: Payer: Medicare HMO

## 2020-06-16 ENCOUNTER — Ambulatory Visit: Payer: Self-pay

## 2020-06-16 NOTE — Chronic Care Management (AMB) (Signed)
  Chronic Care Management   Outreach Note  06/16/2020 Name: Anthony Mosley MRN: [id number] DOB: 1934-12-24  Primary Care Provider: Towanda Malkin, MD Reason for referral : Chronic Care Management    Anthony Mosley is currently engaged with the chronic care management team. He left a voice message acknowledging the previous call attempts. Indicated that he is still interested in outreach but usually away from the home in the afternoon. The care management team will plan to outreach with him within the next few weeks.    Follow Up Plan: Anticipate outreach within the next few weeks.   [facility] [phone]

## 2020-06-26 ENCOUNTER — Telehealth: Payer: Medicare HMO

## 2020-06-29 ENCOUNTER — Ambulatory Visit (INDEPENDENT_AMBULATORY_CARE_PROVIDER_SITE_OTHER): Payer: Medicare HMO

## 2020-06-29 DIAGNOSIS — I509 Heart failure, unspecified: Secondary | ICD-10-CM

## 2020-06-29 DIAGNOSIS — I2510 Atherosclerotic heart disease of native coronary artery without angina pectoris: Secondary | ICD-10-CM | POA: Diagnosis not present

## 2020-06-29 NOTE — Chronic Care Management (AMB) (Signed)
Chronic Care Management   Follow Up Note   06/29/2020 Name: JAMARII BANKS MRN: [id number] DOB: 27-Aug-1935  Primary Care Provider: Towanda Malkin, MD Reason for referral : Chronic Care Management   ALCEE SIPOS is a 85 y.o. year old male who is a primary care patient of Towanda Malkin, MD. He is currently engaged with the chronic care management team. A routine telephonic outreach was conducted today.  Review of Mr. Givler status, including review of consultants reports, relevant labs and test results was conducted today. Collaboration with appropriate care team members was performed as part of the comprehensive evaluation and provision of chronic care management services.    SDOH (Social Determinants of Health) assessments performed: No     Outpatient Encounter Medications as of 06/29/2020  Medication Sig Note  . B COMPLEX VITAMINS PO Take 1 tablet by mouth daily.    . Carboxymethylcellul-Glycerin (LUBRICATING EYE DROPS OP) Apply 1 drop to eye as needed (dry eyes).    . Clobetasol Prop Emollient Base (CLOBETASOL PROPIONATE E) 0.05 % emollient cream Apply to affected areas abdomen twice a day for 1-2 weeks as needed. Avoid face, groin, underarms.   . diclofenac sodium (VOLTAREN) 1 % GEL Apply 4 g topically 4 (four) times daily. To the knee if needed   . Guaifenesin (MUCINEX MAXIMUM STRENGTH) 1200 MG TB12 Take 1,200 mg by mouth at bedtime. 10/04/2019: Takes qhs for congestion  . hydrocortisone 2.5 % cream    . simvastatin (ZOCOR) 40 MG tablet TAKE 1 TABLET EVERY DAY AT 6 PM    No facility-administered encounter medications on file as of 06/29/2020.     Goals Addressed            This Visit's Progress   . COMPLETED: Chronic Disease Management       Current Barriers:  . Chronic Disease Management support, education, and care coordination needs related to CHF and HLD   Clinical Goal(s) related to CHF and HLD:  . Over the next 90 days, patient  will monitor blood pressure and pulse daily and record readings. . Over the next 90 days, patient will weigh daily and maintain a log.  . Over the next 90 days ,patient will take all medications as prescribed. . Over the next 90 days, patient will attend all provider appointments as scheduled.  . Over the next 90 days, patient will adhere to recommended cardiac prudent/heart healthy diet.  . Over the next 90 days, patient will follow recommended safety measures to prevent falls.  Interventions related to CHF and HLD:   . Reviewed safety measures and discussed current activity tolerance. States it's becoming difficult to stand up from the sitting position. Reports increased weakness in his right leg. Reports being evaluated by an Ortho provider last year. He would like to be reevaluated. Reports using caution when ambulating. Denies recent falls.  . Reviewed s/sx of complications related to CHF. Notes several fluctuations with his weight over the past few weeks. Unable to recall exact weight range at time of the call. Denies chest pain or shortness of breath. Does note occasional lower extremity edema. Encouraged to continue weighing daily in the morning and record weights. Reminded to notify provider if weight increases greater than 3 lbs overnight or greater than 5 lbs in a week. He verbalized awareness of worsening s/sx that require immediate medical attention.  . Discussed current status and changes in care management needs. He was previously experiencing challenges d/t serving as the primary caregiver  for his spouse. She was recently placed in a skilled nursing facility near their home. He reports doing well during the day and attempts to visit her daily. Reports compliance with medications and treatment recommendations. He is scheduled for a routine Primary Care visit in September. He requested to be evaluated sooner and possibly referred to an Ortho provider if indicated. Will contact the clinic for  a possible visit in July.   Patient Self Care Activities related to CHF and HLD:  . Patient takes medications as prescribed . Patient attends medical appointments as scheduled . Follows provider's treatment recommendations. . Performs ADL's independently.  Please see past updates related to this goal by clicking on the "Past Updates" button in the selected goal          PLAN The care management team will follow-up with Mr. Holloran later this month.    [facility] [phone]

## 2020-07-04 DIAGNOSIS — Z03818 Encounter for observation for suspected exposure to other biological agents ruled out: Secondary | ICD-10-CM | POA: Diagnosis not present

## 2020-07-04 DIAGNOSIS — J029 Acute pharyngitis, unspecified: Secondary | ICD-10-CM | POA: Diagnosis not present

## 2020-07-04 DIAGNOSIS — J069 Acute upper respiratory infection, unspecified: Secondary | ICD-10-CM | POA: Diagnosis not present

## 2020-07-04 NOTE — Patient Instructions (Signed)
Thank you for allowing the Chronic Care Management team to participate in your care.   Goals Addressed            This Visit's Progress   . COMPLETED: Chronic Disease Management       Current Barriers:  . Chronic Disease Management support, education, and care coordination needs related to CHF and HLD   Clinical Goal(s) related to CHF and HLD:  . Over the next 90 days, patient will monitor blood pressure and pulse daily and record readings. . Over the next 90 days, patient will weigh daily and maintain a log.  . Over the next 90 days ,patient will take all medications as prescribed. . Over the next 90 days, patient will attend all provider appointments as scheduled.  . Over the next 90 days, patient will adhere to recommended cardiac prudent/heart healthy diet.  . Over the next 90 days, patient will follow recommended safety measures to prevent falls.  Interventions related to CHF and HLD:   . Reviewed safety measures and discussed current activity tolerance. States it's becoming difficult to stand up from the sitting position. Reports increased weakness in his right leg. Reports being evaluated by an Ortho provider last year. He would like to be reevaluated. Reports using caution when ambulating. Denies recent falls.  . Reviewed s/sx of complications related to CHF. Notes several fluctuations with his weight over the past few weeks. Unable to recall exact weight range at time of the call. Denies chest pain or shortness of breath. Does note occasional lower extremity edema. Encouraged to continue weighing daily in the morning and record weights. Reminded to notify provider if weight increases greater than 3 lbs overnight or greater than 5 lbs in a week. He verbalized awareness of worsening s/sx that require immediate medical attention.  . Discussed current status and changes in care management needs. He was previously experiencing challenges d/t serving as the primary caregiver for his  spouse. She was recently placed in a skilled nursing facility near their home. He reports doing well during the day and attempts to visit her daily. Reports compliance with medications and treatment recommendations. He is scheduled for a routine Primary Care visit in September. He requested to be evaluated sooner and possibly referred to an Ortho provider if indicated. Will contact the clinic for a possible visit in July.   Patient Self Care Activities related to CHF and HLD:  . Patient takes medications as prescribed . Patient attends medical appointments as scheduled . Follows provider's treatment recommendations. . Performs ADL's independently.  Please see past updates related to this goal by clicking on the "Past Updates" button in the selected goal         Mr. Schonberg verbalized understanding of the information discussed during the telephonic outreach today. Declined need for a mailed/printed copy of the instructions.   The care management team will follow-up with Mr. Mccreadie later this month.   [facility] [phone]

## 2020-07-05 ENCOUNTER — Telehealth: Payer: Medicare HMO

## 2020-07-11 NOTE — Progress Notes (Signed)
Patient ID: Anthony Mosley, male    DOB: 1935-06-20, 85 y.o.   MRN: [id number]  PCP: Towanda Malkin, MD  Chief Complaint  Patient presents with  . Extremity Weakness    legs are weak, he feels he is gaining some strength back     Subjective:   Anthony Mosley is a 85 y.o. male, presents to clinic with CC of the following:  Chief Complaint  Patient presents with  . Extremity Weakness    legs are weak, he feels he is gaining some strength back     HPI:  Patient is an 85 year old male My last visit with him was 05/04/2020.  That note was reviewed. He continues to be followed by cardiology with the next follow-up scheduled for September He did see Dr. Lucky Cowboy the day after our last visit, who reviewed his CT scan, with his ascending aortic aneurysm not felt to be an immediate problem, and continued following recommended. He was seen in urgent care on 07/04/2020 for URI symptoms Follows up today.  He presented with the following symptoms: Sore throat, cough, headache: 85 y.o. male, comes in today with reports of sore throat, cough, nasal congestion, mild generalized headache over the past 2 days. Up-to-date on 2019 novel coronavirus vaccine. No sick contacts with similar symptoms. Denies fever, chills, chest pain, dyspnea, wheezing, stridor, drooling, hemoptysis, lymphadenopathy, conjunctivitis, otalgia, vomiting, diarrhea, abdominal pain, heartburn, loss of sense of smell or taste, fatigue, malaise, body aches. No medications taken for symptoms   The assessment/plan was as follows: 1. Acute URI, unspecified (primary encounter diagnosis) Sore throat, unspecified  Covid test was negative. Will cover for possible bacterial etiology with Omnicef as per orders. Fluticasone nasal spray for nasal symptoms. Benzonatate for cough. Chloraseptic spray, Cepacol lozenges for sore throat pain   He notes that his URI symptoms have improved, still with some minimal congestion  although much improved. He noted he was having some increased leg weakness again and some discomfort at times in the legs a couple weeks back, and made an appointment to be seen, although notes it is much better now. He noted he did see Dr. Lucky Cowboy in the past when he was having the symptoms, and was told his circulation was okay, and does not have a scheduled follow-up with him again. He denies any increased lower extremity swelling, although notes at times they do swell, more often at the end of the day. He also notes his knees were a little sore a few weeks back again, although they seem to have been better in the past couple weeks. He has seen orthopedics in the past and had periodic cortisone injections to help with his knee pain previously.  He notes his wife is now in a nursing home, and she has struggled some with the transition, and notes planning to come home, although he notes he simply cannot take care of her at home by himself, and I did support that decision. They are adjusting with this transition. He at times has had some sadness with this, although knows it was the right thing as he simply could not continue to care for her denies any major depression concerns recently. His PHQ-9 today was reviewed.  Patient Active Problem List   Diagnosis Date Noted  . Claudication ([place]) 05/04/2020  . Lower urinary tract symptoms (LUTS) 05/04/2020  . Arthritis of right knee 05/04/2020  . Pain in limb 03/28/2020  . PAF (paroxysmal atrial fibrillation) ([place]) 12/14/2019  .  Asymptomatic varicose veins of both lower extremities 08/16/2019  . Iron deficiency 08/16/2019  . Anemia 08/13/2019  . Vitamin B12 deficiency 03/24/2019  . Congestive heart failure (CHF) ([place]) 02/22/2019  . Right inguinal hernia 01/21/2019  . Multifocal atrial tachycardia ([place]) 11/11/2018  . Bilateral leg edema 11/11/2018  . Splenic laceration, subsequent encounter 11/10/2018  . Hemoperitoneum   . Near syncope   . Splenic rupture  10/29/2018  . Prediabetes 08/27/2018  . Gilbert syndrome 05/13/2018  . Benign neoplasm of ascending colon   . Rectal polyp   . Benign neoplasm of descending colon   . Positive colorectal cancer screening using Cologuard test 08/07/2017  . Coronary artery disease involving native coronary artery of native heart without angina pectoris 07/02/2017  . Thoracic aortic aneurysm without rupture ([place]) 07/02/2017  . Cardiomyopathy ([place]) 06/13/2017  . Abnormal EKG 04/24/2017  . Dyspnea on exertion 04/24/2017  . Encounter for follow-up surveillance of skin cancer 09/04/2016  . Eyelashes turned in 09/04/2016  . Hyperbilirubinemia 05/02/2016  . Benign fibroma of prostate 10/04/2015  . Atherosclerosis of coronary artery 10/04/2015  . Gonalgia 10/04/2015  . Degeneration of intervertebral disc of lumbar region 10/04/2015  . Dyslipidemia 10/04/2015  . Failure of erection 10/04/2015  . Hemorrhoid 10/04/2015  . Bilirubinemia 10/04/2015  . Blood glucose elevated 10/04/2015  . Keratosis 10/04/2015  . Stage 3 chronic kidney disease 10/04/2015  . Long Q-T syndrome 10/04/2015  . Arthritis of knee, degenerative 10/04/2015  . Arthritis of shoulder region, degenerative 10/04/2015  . Scalp tenderness 10/04/2015  . Skin lesion 10/04/2015  . Scrotal varicose veins 10/04/2015  . Cataract of both eyes 10/04/2015  . Osteoarthritis of left knee 03/31/2015      Current Outpatient Medications:  .  B COMPLEX VITAMINS PO, Take 1 tablet by mouth daily. , Disp: , Rfl:  .  Carboxymethylcellul-Glycerin (LUBRICATING EYE DROPS OP), Apply 1 drop to eye as needed (dry eyes). , Disp: , Rfl:  .  Clobetasol Prop Emollient Base (CLOBETASOL PROPIONATE E) 0.05 % emollient cream, Apply to affected areas abdomen twice a day for 1-2 weeks as needed. Avoid face, groin, underarms., Disp: 30 g, Rfl: 0 .  diclofenac sodium (VOLTAREN) 1 % GEL, Apply 4 g topically 4 (four) times daily. To the knee if needed, Disp: 100 g, Rfl: 2 .   Guaifenesin (MUCINEX MAXIMUM STRENGTH) 1200 MG TB12, Take 1,200 mg by mouth at bedtime., Disp: , Rfl:  .  hydrocortisone 2.5 % cream, , Disp: , Rfl:  .  simvastatin (ZOCOR) 40 MG tablet, TAKE 1 TABLET EVERY DAY AT 6 PM, Disp: 90 tablet, Rfl: 3   Allergies  Allergen Reactions  . Macrolides And Ketolides Other (See Comments)    Prolonged Q-T syndrome  . Norvasc [Amlodipine] Other (See Comments)    Bradycardia  . Avodart [Dutasteride] Itching  . Tape Rash     Past Surgical History:  Procedure Laterality Date  . APPENDECTOMY     Dr Emilio Math  . CARDIAC CATHETERIZATION  2005  . CARDIAC CATHETERIZATION  2002  . CATARACT EXTRACTION W/PHACO Right 10/24/2015   Procedure: CATARACT EXTRACTION PHACO AND INTRAOCULAR LENS PLACEMENT (IOC);  Surgeon: [name], MD;  Location: [facility];  Service: Ophthalmology;  Laterality: Right;  [place] 00:52 AP% 23.5 CDE 12.35 fluid pack lot # [id number]  . CATARACT EXTRACTION W/PHACO Left 11/14/2015   Procedure: CATARACT EXTRACTION PHACO AND INTRAOCULAR LENS PLACEMENT (IOC);  Surgeon: [name], MD;  Location: [facility];  Service: Ophthalmology;  Laterality: Left;  [place]  00:55 AP% 19.1 CDE 10.66 fluid pack lot # [id number]  . COLONOSCOPY  1987  . COLONOSCOPY WITH PROPOFOL N/A 08/22/2017   Procedure: COLONOSCOPY WITH PROPOFOL;  Surgeon: [name], MD;  Location: [facility];  Service: Gastroenterology;  Laterality: N/A;  . EYE SURGERY    . HERNIA REPAIR     umbilical/ Dr Emilio Math  . INGUINAL HERNIA REPAIR Right 02/04/2019   Procedure: HERNIA REPAIR INGUINAL WITH MESH;  Surgeon: Olean Ree, MD;  Location: [facility];  Service: General;  Laterality: Right;  . IR ANGIOGRAM VISCERAL SELECTIVE  10/29/2018  . IR EMBO ART  VEN HEMORR LYMPH EXTRAV  INC GUIDE ROADMAPPING  10/29/2018  . IR THORACENTESIS ASP PLEURAL SPACE W/IMG GUIDE  11/23/2018  . IR US GUIDE VASC ACCESS RIGHT  10/29/2018  . KNEE ARTHROSCOPY Bilateral   . POLYPECTOMY  08/22/2017   Procedure:  POLYPECTOMY;  Surgeon: [name], MD;  Location: [facility];  Service: Gastroenterology;;  . RIGHT/LEFT HEART CATH AND CORONARY ANGIOGRAPHY N/A 06/13/2017   Procedure: Right/Left Heart Cath and Coronary Angiography;  Surgeon: [name], MD;  Location: [facility];  Service: Cardiovascular;  Laterality: N/A;  . TONSILLECTOMY       Family History  Problem Relation Age of Onset  . Alzheimer's disease Mother   . Pulmonary embolism Father   . Diabetes Father   . Heart attack Father 84  . Diabetes Brother   . Heart disease Brother        CABG  . Heart disease Brother        CABG  . Diabetes Brother   . Healthy Brother      Social History   Tobacco Use  . Smoking status: Former Smoker    Packs/day: 1.50    Years: 20.00    Pack years: 30.00    Types: Cigarettes    Quit date: 1987    Years since quitting: 34.5  . Smokeless tobacco: Former Systems developer    Types: Chew    Quit date: 2000  . Tobacco comment: smoking cessation materials not required  Substance Use Topics  . Alcohol use: No    Alcohol/week: 0.0 standard drinks    With staff assistance, above reviewed with the patient today.  ROS: As per HPI, otherwise no specific complaints on a limited and focused system review   No results found for this or any previous visit (from the past 72 hour(s)).   PHQ2/9: Depression screen [facility] 2/9 05/04/2020 04/14/2020 12/14/2019 12/13/2019 08/13/2019  Decreased Interest 0 0 0 0 0  Down, Depressed, Hopeless 1 1 0 0 0  PHQ - 2 Score 1 1 0 0 0  Altered sleeping 1 - 0 - 0  Tired, decreased energy 1 - 0 - 0  Change in appetite 0 - 0 - 0  Feeling bad or failure about yourself  0 - 0 - 0  Trouble concentrating 0 - 0 - 0  Moving slowly or fidgety/restless 0 - 0 - 0  Suicidal thoughts 0 - 0 - 0  PHQ-9 Score 3 - 0 - 0  Difficult doing work/chores Not difficult at all - - - Not difficult at all  Some recent data might be hidden   PHQ-2/9 Result reviewed  Fall  Risk: Fall Risk  07/12/2020 05/04/2020 12/14/2019 12/13/2019 10/04/2019  Falls in the past year? 1 1 0 0 1  Number falls in past yr: 0 0 0 - 0  Comment - - - - patient states "no  falls since back in the winter"  Injury with Fall? 0 0 0 - -  Risk for fall due to : - - - - -  Risk for fall due to: Comment - - - - -  Follow up - - - Falls prevention discussed -      Objective:   Vitals:   07/12/20 0951  Pulse: 52  Resp: 16  Temp: 98.2 F (36.8 C)  TempSrc: Temporal  SpO2: 99%  Weight: 195 lb 14.4 oz (88.9 kg)  Height: 6\' 1"  (1.854 m)    Body mass index is 25.85 kg/m.  Physical Exam   NAD, masked, pleasant HEENT - [place]/AT, sclera anicteric, positive glasses, PERRL, EOMI, conj - non-inj'ed, pharynx clear Neck - supple, no adenopathy, no JVD,  Car -regular rhythm with occas ectopic beat heard,  slightly bradycardic, not irregularly irregular, no S3 noted Pulm- RR and effort normal at rest, CTA without wheeze or rales  Ext - trace right LE edema, no left lower extremity edema,  nontender palpating the calf muscles bilaterally, with no cords identified, no increased erythema or increased warmth, no calf swelling, positive varicosities more prominent on the right versus the left lower extremity. The right knee had increased chronic deformity with adequate motion Neuro/psychiatric - affect was not flat, appropriate with conversation             Alert and oriented             Grossly non-focal              Speech normal   Results for orders placed or performed in visit on 12/14/19  PSA  Result Value Ref Range   PSA 1.0 < OR = 4.0 ng/mL  Hemoglobin A1c  Result Value Ref Range   Hgb A1c MFr Bld 5.6 <5.7 % of total Hgb   Mean Plasma Glucose 114 (calc)   eAG (mmol/L) 6.3 (calc)  COMPLETE METABOLIC PANEL WITH GFR  Result Value Ref Range   Glucose, Bld 101 (H) 65 - 99 mg/dL   BUN 23 7 - 25 mg/dL   Creat 1.34 (H) 0.70 - 1.11 mg/dL   GFR, Est Non African American 49 (L) > OR = 60  mL/min/1.74m2   GFR, Est African American 57 (L) > OR = 60 mL/min/1.72m2   BUN/Creatinine Ratio 17 6 - 22 (calc)   Sodium 140 135 - 146 mmol/L   Potassium 4.5 3.5 - 5.3 mmol/L   Chloride 100 98 - 110 mmol/L   CO2 35 (H) 20 - 32 mmol/L   Calcium 9.4 8.6 - 10.3 mg/dL   Total Protein 6.6 6.1 - 8.1 g/dL   Albumin 4.2 3.6 - 5.1 g/dL   Globulin 2.4 1.9 - 3.7 g/dL (calc)   AG Ratio 1.8 1.0 - 2.5 (calc)   Total Bilirubin 1.8 (H) 0.2 - 1.2 mg/dL   Alkaline phosphatase (APISO) 55 35 - 144 U/L   AST 18 10 - 35 U/L   ALT 9 9 - 46 U/L  CBC  Result Value Ref Range   WBC 6.7 3.8 - 10.8 Thousand/uL   RBC 4.32 4.20 - 5.80 Million/uL   Hemoglobin 14.0 13.2 - 17.1 g/dL   HCT 40.7 38 - 50 %   MCV 94.2 80.0 - 100.0 fL   MCH 32.4 27.0 - 33.0 pg   MCHC 34.4 32.0 - 36.0 g/dL   RDW 11.9 11.0 - 15.0 %   Platelets 174 140 - 400 Thousand/uL   MPV 10.3  7.5 - 12.5 fL  Iron, TIBC and Ferritin Panel  Result Value Ref Range   Iron 113 50 - 180 mcg/dL   TIBC 273 250 - 425 mcg/dL (calc)   %SAT 41 20 - 48 % (calc)   Ferritin 214 24 - 380 ng/mL  B12 and Folate Panel  Result Value Ref Range   Vitamin B-12 407 200 - 1,100 pg/mL   Folate >24.0 ng/mL       Assessment & Plan:   1. Viral upper respiratory tract infection Symptoms have significantly improved, but still some minimal lingering congestive symptoms noted. May have been more of a viral type entity, which can cause some achiness and possibly contributing to some of his leg weakness symptoms. They had been present before he developed the URI symptoms noted. Continue to monitor presently, and if his infectious symptoms increase again, needs to follow-up.  2. Weakness of both lower extremities He noted today the leg symptoms have been significantly improved in the last week plus, and we will hold off on further work-up at this point. He has seen vascular previously, with his circulation felt to be reasonable. If symptoms increasing again, will  follow-up to reassess, may need to follow-up again with vascular over time, pending at reassessment.  3. Mild depression ([place]) To feel is adjusting to his wife now in the nursing home setting, and having some mild reactive depressive symptoms in my opinion. Not marked. It is a difficult transition. Do not feel adding any medicine would be appropriate at this time, especially noting some of the risks and the risk/benefit equation of adding medications, and continue to monitor. He has a follow-up with cardiology in September, and then 1 again with me shortly after, and keep that plan follow-up. Can follow-up sooner as needed.       Towanda Malkin, MD 07/12/20 9:54 AM

## 2020-07-12 ENCOUNTER — Other Ambulatory Visit: Payer: Self-pay

## 2020-07-12 ENCOUNTER — Ambulatory Visit (INDEPENDENT_AMBULATORY_CARE_PROVIDER_SITE_OTHER): Payer: Medicare HMO | Admitting: Internal Medicine

## 2020-07-12 ENCOUNTER — Encounter: Payer: Self-pay | Admitting: Internal Medicine

## 2020-07-12 VITALS — BP 110/70 | HR 52 | Temp 98.2°F | Resp 16 | Ht 73.0 in | Wt 195.9 lb

## 2020-07-12 DIAGNOSIS — R29898 Other symptoms and signs involving the musculoskeletal system: Secondary | ICD-10-CM | POA: Insufficient documentation

## 2020-07-12 DIAGNOSIS — J069 Acute upper respiratory infection, unspecified: Secondary | ICD-10-CM | POA: Diagnosis not present

## 2020-07-12 DIAGNOSIS — F32A Depression, unspecified: Secondary | ICD-10-CM

## 2020-07-12 DIAGNOSIS — F330 Major depressive disorder, recurrent, mild: Secondary | ICD-10-CM | POA: Insufficient documentation

## 2020-07-12 DIAGNOSIS — F320 Major depressive disorder, single episode, mild: Secondary | ICD-10-CM

## 2020-07-19 ENCOUNTER — Telehealth: Payer: Self-pay | Admitting: Internal Medicine

## 2020-07-19 NOTE — Telephone Encounter (Signed)
Copied from [place] [phone]. Topic: Medicare AWV >> Jul 19, 2020  3:35 PM Cher Nakai R wrote: Reason for CRM:   Left message for patient to call back and schedule the Medicare Annual Wellness Visit (AWV) in office or virtual  Last AWV 07/30/2019  Please schedule at anytime with [facility].  40 minute appointment  Any questions, please contact me at [phone]

## 2020-07-21 ENCOUNTER — Other Ambulatory Visit: Payer: Self-pay

## 2020-07-21 ENCOUNTER — Telehealth: Payer: Self-pay

## 2020-07-21 NOTE — Telephone Encounter (Signed)
Prescription was given to the patient by his Dermatologist. He will need to call them for a refill if necessary. Called patient to let him know, no answer, left vm.   Copied from [place] [phone]. Topic: General - Other >> Jul 20, 2020  2:54 PM Rainey Pines A wrote: [facility] is requesting a callback in regards to a presription Clobetasol Prop Emollient Base (CLOBETASOL PROPIONATE E) 0.05 % emollient cream that was faxed over . [phone]

## 2020-08-08 ENCOUNTER — Ambulatory Visit (INDEPENDENT_AMBULATORY_CARE_PROVIDER_SITE_OTHER): Payer: Medicare HMO

## 2020-08-08 DIAGNOSIS — Z0000 Encounter for general adult medical examination without abnormal findings: Secondary | ICD-10-CM | POA: Diagnosis not present

## 2020-08-08 NOTE — Progress Notes (Signed)
Subjective:   [name] is a 85 y.o. male who presents for Medicare Annual/Subsequent preventive examination.  Virtual Visit via Telephone Note  I connected with  Anthony Mosley on 08/08/20 at  3:30 PM EDT by telephone and verified that I am speaking with the correct person using two identifiers.  Medicare Annual Wellness visit completed telephonically due to Covid-19 pandemic.   Location: Patient: home Provider: [place]   I discussed the limitations, risks, security and privacy concerns of performing an evaluation and management service by telephone and the availability of in person appointments. The patient expressed understanding and agreed to proceed.  Unable to perform video visit due to video visit attempted and failed and/or patient does not have video capability.   Some vital signs may be absent or patient reported.   Anthony Marker, LPN    Review of Systems     Cardiac Risk Factors include: advanced age (>24men, >60 women);dyslipidemia;male gender;hypertension     Objective:    Today's Vitals   08/08/20 1532  PainSc: 4    There is no height or weight on file to calculate BMI.  Advanced Directives 08/08/2020 07/30/2019 02/04/2019 01/28/2019 11/10/2018 11/10/2018 11/10/2018  Does Patient Have a Medical Advance Directive? Yes Yes Yes Yes No No No  Type of Paramedic of Anthony Mosley;Living Mosley Anthony Mosley;Living Mosley Living Mosley Living Mosley - - -  Does patient want to make changes to medical advance directive? - No - Patient declined No - Patient declined No - Patient declined - - -  Copy of Anthony Mosley in Chart? Yes - validated most recent copy scanned in chart (See row information) Yes - validated most recent copy scanned in chart (See row information) - - - - -  Would patient like information on creating a medical advance directive? - - - - No - Patient declined No - Patient declined No - Patient declined      Current Medications (verified) Outpatient Encounter Medications as of 08/08/2020  Medication Sig  . B COMPLEX VITAMINS PO Take 1 tablet by mouth daily.   . Carboxymethylcellul-Glycerin (LUBRICATING EYE DROPS OP) Apply 1 drop to eye as needed (dry eyes).   . cholecalciferol (VITAMIN D3) 25 MCG (1000 UNIT) tablet Take 1,000 Units by mouth daily.  . Clobetasol Prop Emollient Base (CLOBETASOL PROPIONATE E) 0.05 % emollient cream Apply to affected areas abdomen twice a day for 1-2 weeks as needed. Avoid face, groin, underarms.  . diclofenac sodium (VOLTAREN) 1 % GEL Apply 4 g topically 4 (four) times daily. To the knee if needed  . fluticasone (FLONASE) 50 MCG/ACT nasal spray Place 1 spray into both nostrils 2 (two) times daily.  . Guaifenesin (MUCINEX MAXIMUM STRENGTH) 1200 MG TB12 Take 1,200 mg by mouth at bedtime.  . hydrocortisone 2.5 % cream   . simvastatin (ZOCOR) 40 MG tablet TAKE 1 TABLET EVERY DAY AT 6 PM   No facility-administered encounter medications on file as of 08/08/2020.    Allergies (verified) Macrolides and ketolides, Norvasc [amlodipine], Avodart [dutasteride], and Tape   History: Past Medical History:  Diagnosis Date  . Arthritis   . [place] of lung (Anthony Mosley)    a. seen on CT.  [name] CAD (coronary artery disease)    a.  Elective R/LHC 06/12/18 showed mild-moderate nonobstructive RCA CAD with mild LAD myocardial bridging but no significant obstructive CAD; there was mildly elevated L heart filling pressure, moderately elevated R heart filling pressure, and mild  pulm HTN.  . Cancer ([place])    SKIN  . Chronic knee pain   . Cough   . Edema    FEET/LEGS  . Former tobacco use   . Hemorrhoids   . Hyperbilirubinemia   . Hyperlipidemia   . Mild aortic insufficiency   . Mild pulmonary hypertension ([place])   . Moderate mitral regurgitation   . NICM (nonischemic cardiomyopathy) (Anthony Mosley)    a. EF 40-45% by echo 05/2017.  [name] Pleural effusion, left 11/2018  . Pneumonia   . PONV  (postoperative nausea and vomiting)    after first cataract  . Rosacea   . Ruptured spleen   . Thoracic aortic aneurysm (Anthony Mosley)    a. 4.4cm by CT 12/2017, imaged incompletely at 4.3cm in 10/2018 CT.  [name] Wears dentures    partial upper  . Wheezing    Past Surgical History:  Procedure Laterality Date  . APPENDECTOMY     Dr Anthony Mosley  . CARDIAC CATHETERIZATION  2005  . CARDIAC CATHETERIZATION  2002  . CATARACT EXTRACTION W/PHACO Right 10/24/2015   Procedure: CATARACT EXTRACTION PHACO AND INTRAOCULAR LENS PLACEMENT (IOC);  Surgeon: [name], MD;  Location: [facility];  Service: Ophthalmology;  Laterality: Right;  [place] 00:52 AP% 23.5 CDE 12.35 fluid pack lot # [id number]  . CATARACT EXTRACTION W/PHACO Left 11/14/2015   Procedure: CATARACT EXTRACTION PHACO AND INTRAOCULAR LENS PLACEMENT (IOC);  Surgeon: [name], MD;  Location: [facility];  Service: Ophthalmology;  Laterality: Left;  [place] 00:55 AP% 19.1 CDE 10.66 fluid pack lot # [id number]  . COLONOSCOPY  1987  . COLONOSCOPY WITH PROPOFOL N/A 08/22/2017   Procedure: COLONOSCOPY WITH PROPOFOL;  Surgeon: [name], MD;  Location: [facility];  Service: Gastroenterology;  Laterality: N/A;  . EYE SURGERY    . HERNIA REPAIR     umbilical/ Dr Anthony Mosley  . INGUINAL HERNIA REPAIR Right 02/04/2019   Procedure: HERNIA REPAIR INGUINAL WITH MESH;  Surgeon: Olean Ree, MD;  Location: [facility];  Service: General;  Laterality: Right;  . IR ANGIOGRAM VISCERAL SELECTIVE  10/29/2018  . IR EMBO ART  VEN HEMORR LYMPH EXTRAV  INC GUIDE ROADMAPPING  10/29/2018  . IR THORACENTESIS ASP PLEURAL SPACE W/IMG GUIDE  11/23/2018  . IR US GUIDE VASC ACCESS RIGHT  10/29/2018  . KNEE ARTHROSCOPY Bilateral   . POLYPECTOMY  08/22/2017   Procedure: POLYPECTOMY;  Surgeon: [name], MD;  Location: [facility];  Service: Gastroenterology;;  . RIGHT/LEFT HEART CATH AND CORONARY ANGIOGRAPHY N/A 06/13/2017   Procedure: Right/Left Heart Cath and Coronary  Angiography;  Surgeon: [name], MD;  Location: [facility];  Service: Cardiovascular;  Laterality: N/A;  . TONSILLECTOMY     Family History  Problem Relation Age of Onset  . Alzheimer's disease Mother   . Pulmonary embolism Father   . Diabetes Father   . Heart attack Father 43  . Diabetes Brother   . Heart disease Brother        CABG  . Heart disease Brother        CABG  . Diabetes Brother   . Healthy Brother    Social History   Socioeconomic History  . Marital status: Married    Spouse name: Jeanett Schlein  . Number of children: 2  . Years of education: Not on file  . Highest education level: 7th grade  Occupational History  . Occupation: Retired  Tobacco Use  . Smoking status: Former Smoker    Packs/day: 1.50  Years: 20.00    Pack years: 30.00    Types: Cigarettes    Quit date: 35    Years since quitting: 34.6  . Smokeless tobacco: Former Systems developer    Types: Chew    Quit date: 2000  . Tobacco comment: smoking cessation materials not required  Vaping Use  . Vaping Use: Never used  Substance and Sexual Activity  . Alcohol use: No    Alcohol/week: 0.0 standard drinks  . Drug use: No  . Sexual activity: Never  Other Topics Concern  . Not on file  Social History Narrative   Pt's wife Tomasa Hosteller in nursing home   Social Determinants of Health   Financial Resource Strain: Low Risk   . Difficulty of Paying Living Expenses: Not hard at all  Food Insecurity: No Food Insecurity  . Worried About Charity fundraiser in the Last Year: Never true  . Ran Out of Food in the Last Year: Never true  Transportation Needs: No Transportation Needs  . Lack of Transportation (Medical): No  . Lack of Transportation (Non-Medical): No  Physical Activity: Inactive  . Days of Exercise per Week: 0 days  . Minutes of Exercise per Session: 0 min  Stress: No Stress Concern Present  . Feeling of Stress : Only a little  Social Connections: Unknown  . Frequency of  Communication with Friends and Family: Patient refused  . Frequency of Social Gatherings with Friends and Family: Patient refused  . Attends Religious Services: Patient refused  . Active Member of Clubs or Organizations: Patient refused  . Attends Archivist Meetings: Patient refused  . Marital Status: Married    Tobacco Counseling Counseling given: Not Answered Comment: smoking cessation materials not required   Clinical Intake:  Pre-visit preparation completed: Yes  Pain : 0-10 Pain Score: 4  Pain Type: Chronic pain Pain Location: Leg Pain Orientation: Right, Left, Lower Pain Descriptors / Indicators: Aching, Sore Pain Onset: More than a month ago Pain Frequency: Constant     Nutritional Risks: None Diabetes: No  How often do you need to have someone help you when you read instructions, pamphlets, or other written materials from your doctor or pharmacy?: 1 - Never    Interpreter Needed?: No  Information entered by :: Anthony Marker LPN   Activities of Daily Living In your present state of health, do you have any difficulty performing the following activities: 08/08/2020 07/12/2020  Hearing? N N  Comment declines hearing aids -  Vision? N N  Difficulty concentrating or making decisions? Tempie Donning  Walking or climbing stairs? Y Y  Dressing or bathing? N N  Doing errands, shopping? N N  Preparing Food and eating ? N -  Using the Toilet? N -  In the past six months, have you accidently leaked urine? N -  Do you have problems with loss of bowel control? N -  Managing your Medications? N -  Managing your Finances? N -  Housekeeping or managing your Housekeeping? N -  Some recent data might be hidden    Patient Care Team: Towanda Malkin, MD as PCP - General (Internal Medicine) End, Harrell Gave, MD as PCP - Cardiology (Cardiology) Watt Climes, PA as Physician Assistant (Physician Assistant) [name], MD as Referring Physician  (Ophthalmology) Brendolyn Patty, MD as Consulting Physician (Dermatology) Dunn, Areta Haber, PA-C as Physician Assistant (Physician Assistant) Johnathan Hausen, MD as Consulting Physician (General Surgery) Neldon Labella, RN as Case Manager  Indicate any recent Medical Services  you may have received from other than [facility] providers in the past year (date may be approximate).     Assessment:   This is a routine wellness examination for [place].  Hearing/Vision screen  Hearing Screening   125Hz  250Hz  500Hz  1000Hz  2000Hz  3000Hz  4000Hz  6000Hz  8000Hz   Right ear:           Left ear:           Comments: Pt denies hearing difficulty  Vision Screening Comments: Annual [facility] screenings [facility] Dr. Michelene Heady  Dietary issues and exercise activities discussed: Current Exercise Habits: The patient does not participate in regular exercise at present, Exercise limited by: orthopedic condition(s)  Goals    . Caregiver Stress     Current Barriers:  . 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  Case Manager Clinical Goal(s):  [name] Over the next 90 days, patient Mosley work with care management team to address needs related to caregiver stress/management of care for spouse   Interventions:  . Evaluation of current treatment plan related to caregiver stress and patient's adherence to plan as established by provider. . Discussed plans with patient for ongoing care management follow up and provided patient with direct contact information for care management team . Discussed current plan regarding in-home assistance for his spouse. Actively engaged with CCM LCSW. Discussing options for long term placement.    Self Care Activities: Patient serves as the primary caregiver for his spouse. . Patient's spouse is unable to perform ADL's independently . Patient's spouse is unable to perform IADL's independently   Please see past updates related to this goal by clicking on  the "Past Updates" button in the selected goal      . Patient Stated     Pt states he would like to be more active since have injections in knees      Depression Screen PHQ 2/9 Scores 08/08/2020 07/12/2020 05/04/2020 04/14/2020 12/14/2019 12/13/2019 08/13/2019  PHQ - 2 Score 1 0 1 1 0 0 0  PHQ- 9 Score 3 2 3  - 0 - 0    Fall Risk Fall Risk  08/08/2020 07/12/2020 05/04/2020 12/14/2019 12/13/2019  Falls in the past year? 1 1 1  0 0  Number falls in past yr: 0 0 0 0 -  Comment - - - - -  Injury with Fall? 0 0 0 0 -  Risk for fall due to : History of fall(s) - - - -  Risk for fall due to: Comment - - - - -  Follow up Falls prevention discussed - - - Falls prevention discussed    Any stairs in or around the home? Yes  If so, are there any without handrails? No  Home free of loose throw rugs in walkways, pet beds, electrical cords, etc? Yes  Adequate lighting in your home to reduce risk of falls? Yes   ASSISTIVE DEVICES UTILIZED TO PREVENT FALLS:  Life alert? No  Use of a cane, walker or w/c? No  Grab bars in the bathroom? Yes  Shower chair or bench in shower? Yes  Elevated toilet seat or a handicapped toilet? Yes   TIMED UP AND GO:  Was the test performed? No . Telephonic visit.   Cognitive Function: Pt declines 6CIT for 2021 AWV     6CIT Screen 05/15/2018 04/07/2017  What Year? 0 points 0 points  What month? 3 points 0 points  What time? 0 points 0 points  Count back from  20 0 points 0 points  Months in reverse 0 points 4 points  Repeat phrase 0 points 2 points  Total Score 3 6    Immunizations Immunization History  Administered Date(s) Administered  . Fluad Quad(high Dose 65+) 08/13/2019  . HiB (PRP-T) 11/04/2018  . Influenza, High Dose Seasonal PF 08/23/2015, 09/16/2016, 10/15/2017, 08/24/2018  . Influenza, Seasonal, Injecte, Preservative Fre 08/27/2012  . Influenza,inj,Quad PF,6+ Mos 08/23/2013, 09/05/2014  . Meningococcal Mcv4o 11/04/2018  . PFIZER SARS-COV-2  Vaccination 01/11/2020, 02/01/2020  . Pneumococcal Conjugate-13 11/17/2014  . Pneumococcal Polysaccharide-23 12/16/2008, 11/04/2018  . Zoster 06/13/2009    TDAP status: Due, Education has been provided regarding the importance of this vaccine. Advised may receive this vaccine at local pharmacy or Health Dept. Aware to provide a copy of the vaccination record if obtained from local pharmacy or Health Dept. Verbalized acceptance and understanding.   Flu Vaccine status: Up to date   Pneumococcal vaccine status: Up to date   Covid-19 vaccine status: Completed vaccines  Qualifies for Shingles Vaccine? Yes   Zostavax completed No   Shingrix Completed?: No.    Education has been provided regarding the importance of this vaccine. Patient has been advised to call insurance company to determine out of pocket expense if they have not yet received this vaccine. Advised may also receive vaccine at local pharmacy or Health Dept. Verbalized acceptance and understanding.  Screening Tests Health Maintenance  Topic Date Due  . INFLUENZA VACCINE  07/16/2020  . TETANUS/TDAP  12/16/2026 (Originally 11/28/1954)  . COVID-19 Vaccine  Completed  . PNA vac Low Risk Adult  Completed    Health Maintenance  Health Maintenance Due  Topic Date Due  . INFLUENZA VACCINE  07/16/2020    Colorectal cancer screening: No longer required.   Lung Cancer Screening: (Low Dose CT Chest recommended if Age 67-80 years, 30 pack-year currently smoking OR have quit w/in 15years.) does not qualify.   Additional Screening:  Hepatitis C Screening: no longer required  Vision Screening: Recommended annual ophthalmology exams for early detection of glaucoma and other disorders of the eye. Is the patient up to date with their annual eye exam?  Yes  Who is the provider or what is the name of the office in which the patient attends annual eye exams? [place] Screening: Recommended annual dental exams for  proper oral hygiene  Community Resource Referral / Chronic Care Management: CRR required this visit?  No   CCM required this visit?  No      Plan:     I have personally reviewed and noted the following in the patient's chart:   . Medical and social history . Use of alcohol, tobacco or illicit drugs  . Current medications and supplements . Functional ability and status . Nutritional status . Physical activity . Advanced directives . List of other physicians . Hospitalizations, surgeries, and ER visits in previous 12 months . Vitals . Screenings to include cognitive, depression, and falls . Referrals and appointments  In addition, I have reviewed and discussed with patient certain preventive protocols, quality metrics, and best practice recommendations. A written personalized care plan for preventive services as well as general preventive health recommendations were provided to patient.   Anthony Marker, LPN   3/47/4259   Nurse Notes: pt c/o stress due to wife now in nursing home. He also c/o pain in legs and plans to discuss at next visit. Pt advised to contact office for earlier appt if needed.

## 2020-08-08 NOTE — Patient Instructions (Signed)
[name] , Thank you for taking time to come for your Medicare Wellness Visit. I appreciate your ongoing commitment to your health goals. Please review the following plan we discussed and let me know if I can assist you in the future.   Screening recommendations/referrals: Colonoscopy: no longer required Recommended yearly ophthalmology/optometry visit for glaucoma screening and checkup Recommended yearly dental visit for hygiene and checkup  Vaccinations: Influenza vaccine: done 08/13/19 - DUE Pneumococcal vaccine: done 11/04/18 Tdap vaccine: DUE Shingles vaccine: Shingrix discussed. Please contact your pharmacy for coverage information.  Covid-19: done 01/11/20 & 02/01/20  Conditions/risks identified: Recommend physical activity  Next appointment: Follow up in one year for your annual wellness visit.   Preventive Care 45 Years and Older, Male Preventive care refers to lifestyle choices and visits with your health care provider that can promote health and wellness. What does preventive care include?  A yearly physical exam. This is also called an annual well check.  Dental exams once or twice a year.  Routine eye exams. Ask your health care provider how often you should have your eyes checked.  Personal lifestyle choices, including:  Daily care of your teeth and gums.  Regular physical activity.  Eating a healthy diet.  Avoiding tobacco and drug use.  Limiting alcohol use.  Practicing safe sex.  Taking low doses of aspirin every day.  Taking vitamin and mineral supplements as recommended by your health care provider. What happens during an annual well check? The services and screenings done by your health care provider during your annual well check will depend on your age, overall health, lifestyle risk factors, and family history of disease. Counseling  Your health care provider may ask you questions about your:  Alcohol use.  Tobacco use.  Drug  use.  Emotional well-being.  Home and relationship well-being.  Sexual activity.  Eating habits.  History of falls.  Memory and ability to understand (cognition).  Work and work Statistician. Screening  You may have the following tests or measurements:  Height, weight, and BMI.  Blood pressure.  Lipid and cholesterol levels. These may be checked every 5 years, or more frequently if you are over 66 years old.  Skin check.  Lung cancer screening. You may have this screening every year starting at age 45 if you have a 30-pack-year history of smoking and currently smoke or have quit within the past 15 years.  Fecal occult blood test (FOBT) of the stool. You may have this test every year starting at age 65.  Flexible sigmoidoscopy or colonoscopy. You may have a sigmoidoscopy every 5 years or a colonoscopy every 10 years starting at age 75.  Prostate cancer screening. Recommendations will vary depending on your family history and other risks.  Hepatitis C blood test.  Hepatitis B blood test.  Sexually transmitted disease (STD) testing.  Diabetes screening. This is done by checking your blood sugar (glucose) after you have not eaten for a while (fasting). You may have this done every 1-3 years.  Abdominal aortic aneurysm (AAA) screening. You may need this if you are a current or former smoker.  Osteoporosis. You may be screened starting at age 74 if you are at high risk. Talk with your health care provider about your test results, treatment options, and if necessary, the need for more tests. Vaccines  Your health care provider may recommend certain vaccines, such as:  Influenza vaccine. This is recommended every year.  Tetanus, diphtheria, and acellular pertussis (Tdap, Td) vaccine. You  may need a Td booster every 10 years.  Zoster vaccine. You may need this after age 33.  Pneumococcal 13-valent conjugate (PCV13) vaccine. One dose is recommended after age  42.  Pneumococcal polysaccharide (PPSV23) vaccine. One dose is recommended after age 59. Talk to your health care provider about which screenings and vaccines you need and how often you need them. This information is not intended to replace advice given to you by your health care provider. Make sure you discuss any questions you have with your health care provider. Document Released: 12/29/2015 Document Revised: 08/21/2016 Document Reviewed: 10/03/2015 Elsevier Interactive Patient Education  2017 [place] Prevention in the Home Falls can cause injuries. They can happen to people of all ages. There are many things you can do to make your home safe and to help prevent falls. What can I do on the outside of my home?  Regularly fix the edges of walkways and driveways and fix any cracks.  Remove anything that might make you trip as you walk through a door, such as a raised step or threshold.  Trim any bushes or trees on the path to your home.  Use bright outdoor lighting.  Clear any walking paths of anything that might make someone trip, such as rocks or tools.  Regularly check to see if handrails are loose or broken. Make sure that both sides of any steps have handrails.  Any raised decks and porches should have guardrails on the edges.  Have any leaves, snow, or ice cleared regularly.  Use sand or salt on walking paths during winter.  Clean up any spills in your garage right away. This includes oil or grease spills. What can I do in the bathroom?  Use night lights.  Install grab bars by the toilet and in the tub and shower. Do not use towel bars as grab bars.  Use non-skid mats or decals in the tub or shower.  If you need to sit down in the shower, use a plastic, non-slip stool.  Keep the floor dry. Clean up any water that spills on the floor as soon as it happens.  Remove soap buildup in the tub or shower regularly.  Attach bath mats securely with double-sided  non-slip rug tape.  Do not have throw rugs and other things on the floor that can make you trip. What can I do in the bedroom?  Use night lights.  Make sure that you have a light by your bed that is easy to reach.  Do not use any sheets or blankets that are too big for your bed. They should not hang down onto the floor.  Have a firm chair that has side arms. You can use this for support while you get dressed.  Do not have throw rugs and other things on the floor that can make you trip. What can I do in the kitchen?  Clean up any spills right away.  Avoid walking on wet floors.  Keep items that you use a lot in easy-to-reach places.  If you need to reach something above you, use a strong step stool that has a grab bar.  Keep electrical cords out of the way.  Do not use floor polish or wax that makes floors slippery. If you must use wax, use non-skid floor wax.  Do not have throw rugs and other things on the floor that can make you trip. What can I do with my stairs?  Do not leave any items  on the stairs.  Make sure that there are handrails on both sides of the stairs and use them. Fix handrails that are broken or loose. Make sure that handrails are as long as the stairways.  Check any carpeting to make sure that it is firmly attached to the stairs. Fix any carpet that is loose or worn.  Avoid having throw rugs at the top or bottom of the stairs. If you do have throw rugs, attach them to the floor with carpet tape.  Make sure that you have a light switch at the top of the stairs and the bottom of the stairs. If you do not have them, ask someone to add them for you. What else can I do to help prevent falls?  Wear shoes that:  Do not have high heels.  Have rubber bottoms.  Are comfortable and fit you well.  Are closed at the toe. Do not wear sandals.  If you use a stepladder:  Make sure that it is fully opened. Do not climb a closed stepladder.  Make sure that both  sides of the stepladder are locked into place.  Ask someone to hold it for you, if possible.  Clearly mark and make sure that you can see:  Any grab bars or handrails.  First and last steps.  Where the edge of each step is.  Use tools that help you move around (mobility aids) if they are needed. These include:  Canes.  Walkers.  Scooters.  Crutches.  Turn on the lights when you go into a dark area. Replace any light bulbs as soon as they burn out.  Set up your furniture so you have a clear path. Avoid moving your furniture around.  If any of your floors are uneven, fix them.  If there are any pets around you, be aware of where they are.  Review your medicines with your doctor. Some medicines can make you feel dizzy. This can increase your chance of falling. Ask your doctor what other things that you can do to help prevent falls. This information is not intended to replace advice given to you by your health care provider. Make sure you discuss any questions you have with your health care provider. Document Released: 09/28/2009 Document Revised: 05/09/2016 Document Reviewed: 01/06/2015 Elsevier Interactive Patient Education  2017 [employer].

## 2020-09-05 ENCOUNTER — Ambulatory Visit: Payer: Medicare HMO | Admitting: Physician Assistant

## 2020-09-05 ENCOUNTER — Ambulatory Visit: Payer: Self-pay | Admitting: *Deleted

## 2020-09-05 NOTE — Chronic Care Management (AMB) (Signed)
  Chronic Care Management   Social Work Note  09/05/2020 Name: EFSTATHIOS SAWIN MRN: [id number] DOB: 09/17/1935  KENTRAIL SHEW is a 85 y.o. year old male who sees Towanda Malkin, MD for primary care.   Phone call from patient needing advice regarding a family member who was hospitalized. Discharge to a rehab facility discussed, frequent  communication with discharge planner regarding expectations recommended as well.  SDOH (Social Determinants of Health) assessments performed: No     Outpatient Encounter Medications as of 09/05/2020  Medication Sig Note  . B COMPLEX VITAMINS PO Take 1 tablet by mouth daily.    . Carboxymethylcellul-Glycerin (LUBRICATING EYE DROPS OP) Apply 1 drop to eye as needed (dry eyes).    . cholecalciferol (VITAMIN D3) 25 MCG (1000 UNIT) tablet Take 1,000 Units by mouth daily.   . Clobetasol Prop Emollient Base (CLOBETASOL PROPIONATE E) 0.05 % emollient cream Apply to affected areas abdomen twice a day for 1-2 weeks as needed. Avoid face, groin, underarms.   . diclofenac sodium (VOLTAREN) 1 % GEL Apply 4 g topically 4 (four) times daily. To the knee if needed   . fluticasone (FLONASE) 50 MCG/ACT nasal spray Place 1 spray into both nostrils 2 (two) times daily.   . Guaifenesin (MUCINEX MAXIMUM STRENGTH) 1200 MG TB12 Take 1,200 mg by mouth at bedtime. 10/04/2019: Takes qhs for congestion  . hydrocortisone 2.5 % cream    . simvastatin (ZOCOR) 40 MG tablet TAKE 1 TABLET EVERY DAY AT 6 PM    No facility-administered encounter medications on file as of 09/05/2020.    Goals Addressed   None     Follow Up Plan: Client will contact this social worker with any additional questions or concerns if needed.  Elliot Gurney, [place] Administrator, [facility] [phone]

## 2020-09-06 ENCOUNTER — Ambulatory Visit: Payer: Medicare HMO | Admitting: Physician Assistant

## 2020-09-06 ENCOUNTER — Encounter: Payer: Self-pay | Admitting: Physician Assistant

## 2020-09-06 ENCOUNTER — Other Ambulatory Visit: Payer: Self-pay

## 2020-09-06 VITALS — BP 118/60 | HR 54 | Ht 73.0 in | Wt 200.2 lb

## 2020-09-06 DIAGNOSIS — I2510 Atherosclerotic heart disease of native coronary artery without angina pectoris: Secondary | ICD-10-CM

## 2020-09-06 DIAGNOSIS — I712 Thoracic aortic aneurysm, without rupture, unspecified: Secondary | ICD-10-CM

## 2020-09-06 DIAGNOSIS — I5020 Unspecified systolic (congestive) heart failure: Secondary | ICD-10-CM

## 2020-09-06 DIAGNOSIS — Z79899 Other long term (current) drug therapy: Secondary | ICD-10-CM | POA: Diagnosis not present

## 2020-09-06 DIAGNOSIS — E782 Mixed hyperlipidemia: Secondary | ICD-10-CM | POA: Diagnosis not present

## 2020-09-06 DIAGNOSIS — I480 Paroxysmal atrial fibrillation: Secondary | ICD-10-CM

## 2020-09-06 DIAGNOSIS — E785 Hyperlipidemia, unspecified: Secondary | ICD-10-CM
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
## 2020-09-06 NOTE — Progress Notes (Signed)
Patient ID: Anthony Mosley, male    DOB: Feb 13, 1935, 85 y.o.   MRN: [id number]  PCP: Towanda Malkin, MD  Chief Complaint  Patient presents with  . Follow-up    Subjective:   Anthony Mosley is a 85 y.o. male, presents to clinic with CC of the following:  Chief Complaint  Patient presents with  . Follow-up    HPI:  Patient is an 85 year old male Follows up today after our visit in May His last visit was at the end of July with that note reviewed   He did see Dr. Lucky Cowboy the day after our last visit, who reviewed his CT scan, with his ascending aortic aneurysm not felt to be an immediate problem, and continued following recommended.He was noted to have a thoracic aneurysm -4.3 cm in the ascending aorta, felt to be stable.  He last saw cardiology yesterday, had labs ordered and a CT angiogram ordered with their assessment/plan as follows:  Nonobstructive CAD -Denies any signs or symptoms concerning for angina.  See above for [facility] in 2018, showing mild to moderate nonobstructive CAD involving the RCA and mLAD myocardial bridging but otherwise no significant CAD involving the left coronary artery.  Previously on ASA 81 mg daily, which was then discontinued given his acute blood loss anemia in the setting of trauma as outlined in HPI above.  Given his previous recommendation for anticoagulation given his history of atrial fibrillation, NOACs were discussed in detail with patient agreeable to start on Eliquis pending recheck of BMET and CBC.  If labs are stable, he will start Eliquis in lieu of ASA 81 mg daily.  He is not on a beta-blocker secondary to soft BP and bradycardic rates.  Continue simvastatin 40 mg daily for risk factor modification.  We will recheck his lipids today.  PAF --NSR today.  As above, previously documented that he should start a NOAC in the setting of PAF documented on outpatient cardiac monitoring from late 2019 with a 30% burden.   Anticoagulation was initially deferred, given he was healing from acute illness/trauma as above in HPI, as well as needing to undergo surgical repair of hernia.  Given he has not yet started on anticoagulation, the risks and benefits of anticoagulation were again discussed in detail today.  He is agreeable to start anticoagulation pending recheck of BMET and CBC.  We discussed that he should look out for signs and symptoms of bleeding.  He is aware that he should go to the emergency department if he were to fall and hit his head, as he will need a head CT.  Pending recheck of BMET and CBC, we will start Eliquis.  He is agreeable to this plan. We will recheck a CBC in 2-4 weeks after he starts Eliquis / RTC.  As above, he is not on rate controlling medications given his bradycardic rate in sinus rhythm and soft BP.  HFrEF --Secondary to NICM.  See above for [facility] from 2018 showing mildly elevated left heart filling pressure and moderately elevated right heart filling pressure with mild pulmonary hypertension.  Most recent echo 11/2019 as above with EF 45-50%, mild LVH, G1DD.  Euvolemic and well compensated on exam.  He denies any signs or symptoms of worsening heart failure.  He denies any lower extremity edema or dyspnea since going off his Lasix.  No indication for restart of Lasix on exam today, and soft BP precludes restart as well.  Soft BP has precluded escalation  of GDMT, including ACE/ARB/spironolactone.  BB precluded by soft BP and bradycardic rate.  He remains bradycardic and mildly hypotensive today without any presyncope or syncope reported.  He reports that he has been bradycardic for years and asymptomatic.  At RTC, could consider updating echo.  Thoracic aortic aneurysm --Previous CTA of the aorta 12/2019 with ascending aorta 71m.  We will order repeat imaging to occur ~1 year from this study to ensure we monitor this aneurysm.  Ongoing annual imaging recommended.  Continue to recommend heart  rate and blood pressure control, as well as cholesterol control.  Continue current statin.  We will update his lipids today.  Follow-up with PCP as directed.   HLD --Update lipids as above.  Continue simvastatin -40 mg daily.  Further recommendations, if needed, pending repeat lipids.  CHF/Hx cardiomyopathy/PAF/HxBLE Edema:Seeing Cardiology,  [place] mildBLE edemathat is dependent, and he thinks has remained a little better here in the more recent past; denieschest pain, or increasing shortness of breath. He is no longer taking lasix daily, rarely uses prn, not in recent past    CAD/Hyperlipidemia:Taking simvastatin and tolerating well;  Lab Results  Component Value Date   CHOL 137 09/06/2020   HDL 49 09/06/2020   LDLCALC 78 09/06/2020   TRIG 40 09/06/2020   CHOLHDL 2.8 09/06/2020    no chest pain, shortness of breath, palpitations, near passing out episodes, or myalgias.  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 Checks sugars at home, in 90-100 usually  and checks about monthly, reported again today that they are always good when checks Lab Results  Component Value Date   HGBA1C 5.6 12/21/2019   HGBA1C 5.6 08/13/2019   HGBA1C 5.4 12/03/2018   Lab Results  Component Value Date   LDLCALC 78 09/06/2020   CREATININE 1.20 09/06/2020     Urinary Sx's/Prostate CA screen: Notes decreased force of urine stream, some nocturia over the last few months (still up 2X/night on average noted).  Some hesitancy, PSAs have been stable      Lab Results  Component Value Date   PSA 1.0 12/21/2019   PSA 0.6 12/03/2018   PSA 2.6 04/07/2017   CKD Stage 3: Has CKD,       Lab Results  Component Value Date   CREATININE 1.34 (H) 12/21/2019   BUN 23 12/21/2019   NA 140 12/21/2019   K 4.5 12/21/2019   CL 100 12/21/2019  CO2 35 (H) 12/21/2019     He noted last visit that his wife is now in a nursing home, and she is doing better last few days. He gets frustrated at times with care there. He visits daily for 4-5 daily. Still adjusting with this transition.  denies any major depression concerns recently.  His PHQ-9 today was reviewed   Tobacco-former smoker Alcohol-denies use   Patient Active Problem List   Diagnosis Date Noted  . Mild depression ([place]) 07/12/2020  . Weakness of both lower extremities 07/12/2020  . Claudication ([place]) 05/04/2020  . Lower urinary tract symptoms (LUTS) 05/04/2020  . Arthritis of right knee 05/04/2020  . Pain in limb 03/28/2020  . PAF (paroxysmal atrial fibrillation) ([place]) 12/14/2019  . Asymptomatic varicose veins of both lower extremities 08/16/2019  . Iron deficiency 08/16/2019  . Anemia 08/13/2019  . Vitamin B12 deficiency 03/24/2019  . Congestive heart failure (CHF) ([place]) 02/22/2019  . Right inguinal hernia 01/21/2019  . Multifocal atrial tachycardia ([place]) 11/11/2018  . Bilateral leg edema 11/11/2018  . Splenic laceration, subsequent encounter 11/10/2018  . Hemoperitoneum   . Near  syncope   . Splenic rupture 10/29/2018  . Prediabetes 08/27/2018  . Gilbert syndrome 05/13/2018  . Benign neoplasm of ascending colon   . Rectal polyp   . Benign neoplasm of descending colon   . Positive colorectal cancer screening using Cologuard test 08/07/2017  . Coronary artery disease involving native coronary artery of native heart without angina pectoris 07/02/2017  . Thoracic aortic aneurysm without rupture ([place]) 07/02/2017  . Cardiomyopathy ([place]) 06/13/2017  . Abnormal EKG 04/24/2017  . Dyspnea on exertion 04/24/2017  . Encounter for follow-up surveillance of skin cancer 09/04/2016  . Eyelashes turned in 09/04/2016  . Hyperbilirubinemia 05/02/2016  . Benign fibroma of prostate 10/04/2015  . Atherosclerosis of coronary artery 10/04/2015  . Gonalgia 10/04/2015  . Degeneration of intervertebral disc of lumbar region 10/04/2015  . Dyslipidemia 10/04/2015  . Failure of erection 10/04/2015  . Hemorrhoid 10/04/2015  . Bilirubinemia 10/04/2015  . Blood glucose elevated 10/04/2015  . Keratosis 10/04/2015  . Stage 3 chronic kidney disease 10/04/2015  . Long Q-T syndrome 10/04/2015  . Arthritis of knee, degenerative 10/04/2015  . Arthritis of shoulder region, degenerative 10/04/2015  . Scalp tenderness 10/04/2015  . Skin lesion 10/04/2015  . Scrotal varicose veins 10/04/2015  . Cataract of both eyes 10/04/2015  . Osteoarthritis of left knee 03/31/2015      Current Outpatient Medications:  .  B COMPLEX VITAMINS PO, Take 1 tablet by mouth daily. , Disp: , Rfl:  .  Carboxymethylcellul-Glycerin (LUBRICATING EYE DROPS OP), Apply 1 drop to eye as needed (dry eyes). , Disp: , Rfl:  .  cholecalciferol (VITAMIN D3) 25 MCG (1000 UNIT) tablet, Take 1,000 Units by mouth daily., Disp: , Rfl:  .  diclofenac sodium (VOLTAREN) 1 % GEL, Apply 4 g topically 4 (four) times daily. To the knee if needed, Disp: 100 g, Rfl: 2 .  Guaifenesin (MUCINEX MAXIMUM STRENGTH) 1200 MG TB12, Take 1,200 mg by  mouth at bedtime., Disp: , Rfl:  .  simvastatin (ZOCOR) 40 MG tablet, TAKE 1 TABLET EVERY DAY AT 6 PM, Disp: 90 tablet, Rfl: 3   Allergies  Allergen Reactions  . Macrolides And Ketolides Other (See Comments)    Prolonged Q-T syndrome  . Norvasc [Amlodipine] Other (See Comments)    Bradycardia  . Avodart [Dutasteride] Itching  . Tape Rash     Past Surgical History:  Procedure Laterality  Date  . APPENDECTOMY     Dr Emilio Math  . CARDIAC CATHETERIZATION  2005  . CARDIAC CATHETERIZATION  2002  . CATARACT EXTRACTION W/PHACO Right 10/24/2015   Procedure: CATARACT EXTRACTION PHACO AND INTRAOCULAR LENS PLACEMENT (IOC);  Surgeon: [name], MD;  Location: [facility];  Service: Ophthalmology;  Laterality: Right;  [place] 00:52 AP% 23.5 CDE 12.35 fluid pack lot # [id number]  . CATARACT EXTRACTION W/PHACO Left 11/14/2015   Procedure: CATARACT EXTRACTION PHACO AND INTRAOCULAR LENS PLACEMENT (IOC);  Surgeon: [name], MD;  Location: [facility];  Service: Ophthalmology;  Laterality: Left;  [place] 00:55 AP% 19.1 CDE 10.66 fluid pack lot # [id number]  . COLONOSCOPY  1987  . COLONOSCOPY WITH PROPOFOL N/A 08/22/2017   Procedure: COLONOSCOPY WITH PROPOFOL;  Surgeon: [name], MD;  Location: [place];  Service: Gastroenterology;  Laterality: N/A;  . EYE SURGERY    . HERNIA REPAIR     umbilical/ Dr Emilio Math  . INGUINAL HERNIA REPAIR Right 02/04/2019   Procedure: HERNIA REPAIR INGUINAL WITH MESH;  Surgeon: Olean Ree, MD;  Location: [facility];  Service: General;  Laterality: Right;  . IR ANGIOGRAM VISCERAL SELECTIVE  10/29/2018  . IR EMBO ART  VEN HEMORR LYMPH EXTRAV  INC GUIDE ROADMAPPING  10/29/2018  . IR THORACENTESIS ASP PLEURAL SPACE W/IMG GUIDE  11/23/2018  . IR US GUIDE VASC ACCESS RIGHT  10/29/2018  . KNEE ARTHROSCOPY Bilateral   . POLYPECTOMY  08/22/2017   Procedure: POLYPECTOMY;  Surgeon: [name], MD;  Location: [place];  Service: Gastroenterology;;  . RIGHT/LEFT  HEART CATH AND CORONARY ANGIOGRAPHY N/A 06/13/2017   Procedure: Right/Left Heart Cath and Coronary Angiography;  Surgeon: [name], MD;  Location: [place] [facility];  Service: Cardiovascular;  Laterality: N/A;  . TONSILLECTOMY       Family History  Problem Relation Age of Onset  . Alzheimer's disease Mother   . Pulmonary embolism Father   . Diabetes Father   . Heart attack Father 10  . Diabetes Brother   . Heart disease Brother        CABG  . Heart disease Brother        CABG  . Diabetes Brother   . Healthy Brother      Social History   Tobacco Use  . Smoking status: Former Smoker    Packs/day: 1.50    Years: 20.00    Pack years: 30.00    Types: Cigarettes    Quit date: 1987    Years since quitting: 34.7  . Smokeless tobacco: Former Systems developer    Types: Chew    Quit date: 2000  . Tobacco comment: smoking cessation materials not required  Substance Use Topics  . Alcohol use: No    Alcohol/week: 0.0 standard drinks    With staff assistance, above reviewed with the patient today.  ROS: As per HPI, he notes his knees can sometimes be a little sore when he first gets up, although gets better after he is walking briefly and denies any major swelling or marked increased pain, notes at times has trouble back to sleep after he awakens to urinate overnight, just thinking a lot as he noted, does not want any medicines to help with sleep when discussed, otherwise no specific complaints on a limited and focused system review   Results for orders placed or performed in visit on 09/06/20 (from the past 72 hour(s))  CBC w/Diff     Status: Abnormal   Collection Time: 09/06/20 12:49  PM  Result Value Ref Range   WBC 5.7 3.4 - 10.8 x10E3/uL   RBC 4.45 4.14 - 5.80 x10E6/uL   Hemoglobin 13.8 13.0 - 17.7 g/dL   Hematocrit 40.7 37.5 - 51.0 %   MCV 92 79 - 97 fL   MCH 31.0 26.6 - 33.0 pg   MCHC 33.9 31 - 35 g/dL   RDW 13.1 11.6 - 15.4 %   Platelets 149 (L) 150 - 450 x10E3/uL    Neutrophils 61 Not Estab. %   Lymphs 30 Not Estab. %   Monocytes 7 Not Estab. %   Eos 1 Not Estab. %   Basos 1 Not Estab. %   Neutrophils Absolute 3.4 1 - 7 x10E3/uL   Lymphocytes Absolute 1.7 0 - 3 x10E3/uL   Monocytes Absolute 0.4 0 - 0 x10E3/uL   EOS (ABSOLUTE) 0.1 0.0 - 0.4 x10E3/uL   Basophils Absolute 0.1 0 - 0 x10E3/uL   Immature Granulocytes 0 Not Estab. %   Immature Grans (Abs) 0.0 0.0 - 0.1 [id number]/LP  Basic metabolic panel     Status: Abnormal   Collection Time: 09/06/20 12:49 PM  Result Value Ref Range   Glucose 99 65 - 99 mg/dL   BUN 22 8 - 27 mg/dL   Creatinine, Ser 1.20 0.76 - 1.27 mg/dL   GFR calc non Af Amer 56 (L) >59 mL/min/1.73   GFR calc Af Amer 65 >59 mL/min/1.73    Comment: **[facility] currently reports eGFR in compliance with the current**   recommendations of the [employer]. [facility] will   update reporting as new guidelines are published from the NKF-ASN   Task force.    BUN/Creatinine Ratio 18 10 - 24   Sodium 142 134 - 144 mmol/L   Potassium 4.5 3.5 - 5.2 mmol/L   Chloride 103 96 - 106 mmol/L   CO2 26 20 - 29 mmol/L   Calcium 9.0 8.6 - 10.2 mg/dL  Lipid Profile     Status: None   Collection Time: 09/06/20 12:49 PM  Result Value Ref Range   Cholesterol, Total 137 100 - 199 mg/dL   Triglycerides 40 0 - 149 mg/dL   HDL 49 >39 mg/dL   VLDL Cholesterol Cal 10 5 - 40 mg/dL   LDL Chol Calc (NIH) 78 0 - 99 mg/dL   Chol/HDL Ratio 2.8 0.0 - 5.0 ratio    Comment:                                   T. Chol/HDL Ratio                                             Men  Women                               1/2 Avg.Risk  3.4    3.3                                   Avg.Risk  5.0    4.4  2X Avg.Risk  9.6    7.1                                3X Avg.Risk 23.4   11.0      PHQ2/9: Depression screen [facility] 2/9 09/07/2020 08/08/2020 07/12/2020 05/04/2020 04/14/2020  Decreased Interest 0 0 0 0 0  Down, Depressed, Hopeless 1 1 0 1 1   PHQ - 2 Score 1 1 0 1 1  Altered sleeping - _0 -  Tired, decreased energy - _1 -  Change in appetite - 0 0 0 -  Feeling bad or failure about yourself  - 0 0 0 -  Trouble concentrating - 0 0 0 -  Moving slowly or fidgety/restless - 0 0 0 -  Suicidal thoughts - 0 0 0 -  PHQ-9 Score - _2 -  Difficult doing work/chores - Not difficult at all Not difficult at all Not difficult at all -  Some recent data might be hidden   PHQ-2/9 Result reviewed  Fall Risk: Fall Risk  09/07/2020 08/08/2020 07/12/2020 05/04/2020 12/14/2019  Falls in the past year? _3 0  Number falls in past yr: 0 0 0 0 0  Comment - - - - -  Injury with Fall? 0 0 0 0 0  Risk for fall due to : - History of fall(s) - - -  Risk for fall due to: Comment - - - - -  Follow up Falls evaluation completed Falls prevention discussed - - -      Objective:   Vitals:   09/07/20 0858  BP: 118/60  Pulse: (!) 56  Resp: 16  Temp: 98 F (36.7 C)  TempSrc: Oral  SpO2: 99%  Weight: 200 lb 11.2 oz (91 kg)  Height: _4  (1.854 m)    Body mass index is 26.48 kg/m.  Physical Exam   NAD, masked,pleasant HEENT - [place]/AT, sclera anicteric,positive glasses,PERRL, EOMI, conj - non-inj'ed, pharynx clear Neck - supple, no adenopathy, noJVD,  Car -regular rhythm with occas ectopic beat heard, slightly bradycardic, not irregularly irregular, no S3 noted Pulm- RR and effort normal at rest, CTA without wheeze or rales Ext - trace rightLE edema, no left lower extremity edema, nontender palpating the calf muscles bilaterally, with no cords identified, no increased erythema or increased warmth, no calf swelling, positive varicosities more prominent on the right versus the left lower extremity. The right knee had increased chronic deformity with adequate motion Neuro/psychiatric - affect was not flat, appropriate with conversation Alert and oriented Grossly non-focal  Speech normal    Results for orders placed or performed in visit on 09/06/20  CBC w/Diff  Result Value Ref Range   WBC 5.7 3.4 - 10.8 x10E3/uL   RBC 4.45 4.14 - 5.80 x10E6/uL   Hemoglobin 13.8 13.0 - 17.7 g/dL   Hematocrit 40.7 37.5 - 51.0 %   MCV 92 79 - 97 fL   MCH 31.0 26.6 - 33.0 pg   MCHC 33.9 31 - 35 g/dL   RDW 13.1 11.6 - 15.4 %   Platelets 149 (L) 150 - 450 x10E3/uL   Neutrophils 61 Not Estab. %   Lymphs 30 Not Estab. %   Monocytes 7 Not Estab. %   Eos 1 Not Estab. %   Basos 1 Not Estab. %   Neutrophils Absolute 3.4 1 - 7 x10E3/uL   Lymphocytes Absolute  1.7 0 - 3 x10E3/uL   Monocytes Absolute 0.4 0 - 0 x10E3/uL   EOS (ABSOLUTE) 0.1 0.0 - 0.4 x10E3/uL   Basophils Absolute 0.1 0 - 0 x10E3/uL   Immature Granulocytes 0 Not Estab. %   Immature Grans (Abs) 0.0 0.0 - 0.1 [id number]/IH  Basic metabolic panel  Result Value Ref Range   Glucose 99 65 - 99 mg/dL   BUN 22 8 - 27 mg/dL   Creatinine, Ser 1.20 0.76 - 1.27 mg/dL   GFR calc non Af Amer 56 (L) >59 mL/min/1.73   GFR calc Af Amer 65 >59 mL/min/1.73   BUN/Creatinine Ratio 18 10 - 24   Sodium 142 134 - 144 mmol/L   Potassium 4.5 3.5 - 5.2 mmol/L   Chloride 103 96 - 106 mmol/L   CO2 26 20 - 29 mmol/L   Calcium 9.0 8.6 - 10.2 mg/dL  Lipid Profile  Result Value Ref Range   Cholesterol, Total 137 100 - 199 mg/dL   Triglycerides 40 0 - 149 mg/dL   HDL 49 >39 mg/dL   VLDL Cholesterol Cal 10 5 - 40 mg/dL   LDL Chol Calc (NIH) 78 0 - 99 mg/dL   Chol/HDL Ratio 2.8 0.0 - 5.0 ratio   Labs recently reviewed    Assessment & Plan:   1. Congestive heart failure, unspecified HF chronicity, unspecified heart failure type ([place])  2. Coronary artery disease involving native coronary artery of native heart without angina pectoris  3. Cardiomyopathy, unspecified type ([place])  4. PAF (paroxysmal atrial fibrillation) ([place])  5. Thoracic aortic aneurysm without rupture ([place])  For above, followed by cardiology with a recent note reviewed after he was  seen yesterday. Agree with starting Eliquis as planned. Also will have a repeat CT scan approximately 1 year from previous to follow the aneurysm Continue his current medications as outlined in cardiology's assessment/plan  6. Claudication [facility]) Has followed with vascular, no increasing symptoms of concern in the recent past.  7. Dyslipidemia Continues on the statin, with last lipid check showing the LDL slightly above the desired range of less than 70  8. Mild depression ([place]) Is doing well with the adjustment and transition of his wife now in the nursing home. Has some frustrations at times, and are appropriate. Do not feel any medication addition will be helpful presently and he was not anxious to add medications presently.  9. Prediabetes His sugar checks at home have remained good. We will repeat an A1c on his next plan follow-up visit.  10. Stage 3 chronic kidney disease, unspecified whether stage 3a or 3b CKD Has remained stable, continuing to monitor  11. Lower urinary tract symptoms (LUTS) No increase in symptoms in recent past, still up a couple times a night to urinate with some hesitancy symptoms. PSAs have remained stable.  12. Gilbert syndrome Continuing to monitor,  13. Anemia, unspecified type Last CBC was good, with no anemia concerns. We will continue to monitor.  14. Primary osteoarthritis of both knees No marked increased symptoms in the recent past, still some intermittent symptoms at times noted. Continue to follow.   Tentatively, we will schedule follow-up in January, and can follow-up sooner as needed.  We will check a PSA and A1c on that follow-up visit, and other labs as felt needed at that time. Continue to follow-up with cardiology as planned, including the addition of Eliquis as noted He did get a flu shot today as recommended.     Towanda Malkin, MD  09/07/20 9:14 AM

## 2020-09-06 NOTE — Patient Instructions (Signed)
Medication Instructions:  - Your physician recommends that you continue on your current medications as directed. Please refer to the Current Medication list given to you today.  *If you need a refill on your cardiac medications before your next appointment, please call your pharmacy*   Lab Work: - Your physician recommends that you have lab work today: BMP/ CBC/ Lipid  If you have labs (blood work) drawn today and your tests are completely normal, you will receive your results only by: [name] MyChart Message (if you have MyChart) OR . A paper copy in the mail If you have any lab test that is abnormal or we need to change your treatment, we will call you to review the results.   Testing/Procedures:  1) CT angiogram of the chest/ aorta: - Non-Cardiac CT Angiography (CTA), is a special type of CT scan that uses a computer to produce multi-dimensional views of major blood vessels throughout the body. In CT angiography, a contrast material is injected through an IV to help visualize the blood vessels  - Please call [phone] to schedule this test - if you need any assistance, please call the office at [phone]   Follow-Up: At [facility], you and your health needs are our priority.  As part of our continuing mission to provide you with exceptional heart care, we have created designated Provider Care Teams.  These Care Teams include your primary Cardiologist (physician) and Advanced Practice Providers (APPs -  Physician Assistants and Nurse Practitioners) who all work together to provide you with the care you need, when you need it.  We recommend signing up for the patient portal called "MyChart".  Sign up information is provided on this After Visit Summary.  MyChart is used to connect with patients for Virtual Visits (Telemedicine).  Patients are able to view lab/test results, encounter notes, upcoming appointments, etc.  Non-urgent messages can be sent to your provider as well.   To  learn more about what you can do with MyChart, go to [email or web address].    Your next appointment:   1 month(s)  The format for your next appointment:   In Person  Provider:   You may see [name], MD or one of the following Advanced Practice Providers on your designated Care Team:    Murray Hodgkins, NP  Christell Faith, PA-C  Marrianne Mood, PA-C  Cadence Kathlen Mody, [place]    Other Instructions

## 2020-09-07 ENCOUNTER — Ambulatory Visit (INDEPENDENT_AMBULATORY_CARE_PROVIDER_SITE_OTHER): Payer: Medicare HMO | Admitting: Internal Medicine

## 2020-09-07 ENCOUNTER — Encounter: Payer: Self-pay | Admitting: Physician Assistant

## 2020-09-07 ENCOUNTER — Encounter: Payer: Self-pay | Admitting: Internal Medicine

## 2020-09-07 VITALS — BP 118/60 | HR 56 | Temp 98.0°F | Resp 16 | Ht 73.0 in | Wt 200.7 lb

## 2020-09-07 DIAGNOSIS — M170 Bilateral primary osteoarthritis of knee: Secondary | ICD-10-CM

## 2020-09-07 DIAGNOSIS — R399 Unspecified symptoms and signs involving the genitourinary system: Secondary | ICD-10-CM

## 2020-09-07 DIAGNOSIS — Z23 Encounter for immunization: Secondary | ICD-10-CM | POA: Diagnosis not present

## 2020-09-07 DIAGNOSIS — I712 Thoracic aortic aneurysm, without rupture, unspecified: Secondary | ICD-10-CM

## 2020-09-07 DIAGNOSIS — I739 Peripheral vascular disease, unspecified: Secondary | ICD-10-CM

## 2020-09-07 DIAGNOSIS — I2510 Atherosclerotic heart disease of native coronary artery without angina pectoris: Secondary | ICD-10-CM

## 2020-09-07 DIAGNOSIS — F32A Depression, unspecified: Secondary | ICD-10-CM

## 2020-09-07 DIAGNOSIS — E785 Hyperlipidemia, unspecified: Secondary | ICD-10-CM

## 2020-09-07 DIAGNOSIS — F320 Major depressive disorder, single episode, mild: Secondary | ICD-10-CM

## 2020-09-07 DIAGNOSIS — I509 Heart failure, unspecified: Secondary | ICD-10-CM | POA: Diagnosis not present

## 2020-09-07 DIAGNOSIS — R7303 Prediabetes: Secondary | ICD-10-CM | POA: Diagnosis not present

## 2020-09-07 DIAGNOSIS — N183 Chronic kidney disease, stage 3 unspecified: Secondary | ICD-10-CM

## 2020-09-07 DIAGNOSIS — D649 Anemia, unspecified: Secondary | ICD-10-CM

## 2020-09-07 DIAGNOSIS — I480 Paroxysmal atrial fibrillation: Secondary | ICD-10-CM

## 2020-09-07 DIAGNOSIS — I429 Cardiomyopathy, unspecified: Secondary | ICD-10-CM

## 2020-09-07 LAB — CBC WITH DIFFERENTIAL/PLATELET
Basophils Absolute: 0.1 10*3/uL (ref 0.0–0.2)
Basos: 1 %
EOS (ABSOLUTE): 0.1 10*3/uL (ref 0.0–0.4)
Eos: 1 %
Hematocrit: 40.7 % (ref 37.5–51.0)
Hemoglobin: 13.8 g/dL (ref 13.0–17.7)
Immature Grans (Abs): 0 10*3/uL (ref 0.0–0.1)
Immature Granulocytes: 0 %
Lymphocytes Absolute: 1.7 10*3/uL (ref 0.7–3.1)
Lymphs: 30 %
MCH: 31 pg (ref 26.6–33.0)
MCHC: 33.9 g/dL (ref 31.5–35.7)
MCV: 92 fL (ref 79–97)
Monocytes Absolute: 0.4 10*3/uL (ref 0.1–0.9)
Monocytes: 7 %
Neutrophils Absolute: 3.4 10*3/uL (ref 1.4–7.0)
Neutrophils: 61 %
Platelets: 149 10*3/uL — ABNORMAL LOW (ref 150–450)
RBC: 4.45 x10E6/uL (ref 4.14–5.80)
RDW: 13.1 % (ref 11.6–15.4)
WBC: 5.7 10*3/uL (ref 3.4–10.8)

## 2020-09-07 LAB — LIPID PANEL
Chol/HDL Ratio: 2.8 ratio (ref 0.0–5.0)
Cholesterol, Total: 137 mg/dL (ref 100–199)
HDL: 49 mg/dL (ref >39)
LDL Chol Calc (NIH): 78 mg/dL (ref 0–99)
Triglycerides: 40 mg/dL (ref 0–149)
VLDL Cholesterol Cal: 10 mg/dL (ref 5–40)

## 2020-09-07 LAB — BASIC METABOLIC PANEL
BUN/Creatinine Ratio: 18 (ref 10–24)
BUN: 22 mg/dL (ref 8–27)
CO2: 26 mmol/L (ref 20–29)
Calcium: 9 mg/dL (ref 8.6–10.2)
Chloride: 103 mmol/L (ref 96–106)
Creatinine, Ser: 1.2 mg/dL (ref 0.76–1.27)
GFR calc Af Amer: 65 mL/min/{1.73_m2} (ref >59)
GFR calc non Af Amer: 56 mL/min/{1.73_m2} — ABNORMAL LOW (ref >59)
Glucose: 99 mg/dL (ref 65–99)
Potassium: 4.5 mmol/L (ref 3.5–5.2)
Sodium: 142 mmol/L (ref 134–144)

## 2020-09-07 NOTE — Addendum Note (Signed)
Addended by: Lennie Muckle on: 09/07/2020 10:25 AM   Modules accepted: Orders

## 2020-09-08 ENCOUNTER — Telehealth: Payer: Medicare HMO

## 2020-09-08 ENCOUNTER — Telehealth: Payer: Self-pay | Admitting: *Deleted

## 2020-09-08 ENCOUNTER — Encounter: Payer: Self-pay | Admitting: *Deleted

## 2020-09-08 DIAGNOSIS — I480 Paroxysmal atrial fibrillation: Secondary | ICD-10-CM

## 2020-09-08 DIAGNOSIS — Z79899 Other long term (current) drug therapy: Secondary | ICD-10-CM

## 2020-09-08 DIAGNOSIS — I5020 Unspecified systolic (congestive) heart failure: Secondary | ICD-10-CM

## 2020-09-08 MED ORDER — ATORVASTATIN CALCIUM 80 MG PO TABS: 80.0000 mg | ORAL_TABLET | Freq: Every day | ORAL | 2 refills | Status: DC

## 2020-09-08 MED ORDER — APIXABAN 5 MG PO TABS: 5.0000 mg | ORAL_TABLET | Freq: Two times a day (BID) | ORAL | 2 refills | Status: DC

## 2020-09-08 NOTE — Telephone Encounter (Signed)
-----   Message from Arvil Chaco, PA-C sent at 09/08/2020  1:25 PM EDT ----- --Blood counts stable. He is not anemic.  --Renal function stable (and allows for full dose anticoagulation). --Please have him start Eliquis 5mg  twice daily.  --We will recheck a CBC and BMET in 2-4 weeks to ensure labs stable.   --Total cholesterol 137 with LDL (bad cholesterol) 78 and not at goal below 70. --If agreeable, stop simvastatin & start atorvastatin 80mg  daily for better control. --Recheck lipids and liver function labs in 6-8 weeks.

## 2020-09-12 NOTE — Telephone Encounter (Signed)
Addendum entry: On Friday I called a number and spoke with his son first. His son asked for me to call the patient. I had already called the home number and left message. Son gave me patient's cell phone number and I was able to reach patient at that number.    I did speak with patient on Friday 09/08/20. He verbalized understanding of the results and instructions for his plan of care. He was not in a place to right down all the information so I mailed him a letter with the results and instructions. Orders entered and med list updated.

## 2020-09-14 ENCOUNTER — Ambulatory Visit
Admission: RE | Admit: 2020-09-14 | Discharge: 2020-09-14 | Disposition: A | Discharge status: Home / Self Care | Payer: Medicare HMO | Source: Ambulatory Visit | Attending: Physician Assistant | Admitting: Physician Assistant

## 2020-09-14 ENCOUNTER — Other Ambulatory Visit: Payer: Self-pay

## 2020-09-14 DIAGNOSIS — I2510 Atherosclerotic heart disease of native coronary artery without angina pectoris: Secondary | ICD-10-CM | POA: Diagnosis not present

## 2020-09-14 DIAGNOSIS — I712 Thoracic aortic aneurysm, without rupture, unspecified: Secondary | ICD-10-CM

## 2020-09-14 DIAGNOSIS — I700 Atherosclerosis of aorta: Secondary | ICD-10-CM | POA: Diagnosis not present

## 2020-09-14 DIAGNOSIS — J439 Emphysema, unspecified: Secondary | ICD-10-CM | POA: Diagnosis not present

## 2020-09-14 MED ORDER — IOHEXOL 350 MG/ML SOLN
100.0000 mL | Freq: Once | INTRAVENOUS | Status: AC | PRN
  Administered 2020-09-14: 100 mL via INTRAVENOUS

## 2020-09-18 ENCOUNTER — Telehealth: Payer: Self-pay | Admitting: Physician Assistant

## 2020-09-18 DIAGNOSIS — I712 Thoracic aortic aneurysm, without rupture, unspecified: Secondary | ICD-10-CM

## 2020-09-18 DIAGNOSIS — I77810 Thoracic aortic ectasia: Secondary | ICD-10-CM

## 2020-09-18 NOTE — Telephone Encounter (Signed)
Clarified with Dr. Saunders Revel prior to calling the patient that no surgical referral is needed at this time. Repeat CT chest in 6 months.  Attempted to call the patient. No answer at the home #- I left a message to please call back. Cell #- no answer & no voice mail set up.

## 2020-09-18 NOTE — Telephone Encounter (Signed)
Arvil Chaco, PA-C  09/15/2020 4:41 PM EDT     Please let Mr. Luty know that his aortic dilation has progressed slightly from 4.3cm (12/2019) to 4.5cm (9/29).   Please schedule a repeat CT in 6 months to monitor this dilation, as well as get a repeat echo by the end of 2021. In the meantime, we will continue to control HR, BP, and cholesterol.   I will include him primary cardiologist, Dr. Saunders Revel, on this result note to make him aware of the report's recommendation for referral to CTS and defer to him regarding the decision to place this referral.  His splenic fluid is resolving, which is good news. The images also show minor coronary artery calcification. We will just continue to control his cholesterol as above. He also has mild emphysematous changes in the lower lungs.

## 2020-09-19 NOTE — Telephone Encounter (Signed)
Repeat CT angio chest aorta for in 6 months entered.

## 2020-09-19 NOTE — Addendum Note (Signed)
Addended by: Annia Belt on: 09/19/2020 09:36 AM   Modules accepted: Orders

## 2020-09-19 NOTE — Telephone Encounter (Signed)
I spoke with the patient regarding his chest CT. I have advised him he will need to have this repeated in 6 months per Dr. Saunders Revel. He is also aware that Jac[place], [place] is recommending an echo be done prior to the end of the year.   He is aware we will place the order for the echo and someone from scheduling will call him to arrange this.   The patient also states that he was surprised that his Eliquis RX cost him $125/ 3 months supply.  I advised him if this is cost prohibitive, then we can help him apply for patient assistance. However, I asked him to please call his insurance company first and see if Xarelto/ Pradaxa would be less expensive for him. We discussed warfarin as well, but he does not want to come in for monthly blood checks.  He states he is not taking much so he is not in the donut hole at this time. He states the $125/ 3 months supply is not really a problem right now, but he was surprised by the cost.   I have asked that he call us if he speaks with his insurance and finds that Xarelto/ Pradaxa are less expensive for him.   He voices understanding of the above and is agreeable.

## 2020-09-26 ENCOUNTER — Telehealth: Payer: Self-pay

## 2020-09-26 NOTE — Telephone Encounter (Signed)
°  Chronic Care Management   Outreach Note  09/26/2020 Name: Anthony Mosley MRN: [id number] DOB: 13-Dec-1935    Primary Care Provider: Towanda Malkin, MD Reason for referral : Chronic Care Management   An unsuccessful telephone outreach was attempted today. Anthony Mosley is enrolled in the chronic care management program.   Follow Up Plan:  A HIPAA compliant voice message was left today requesting a return call.    [facility] [phone]

## 2020-10-06 ENCOUNTER — Other Ambulatory Visit: Payer: Self-pay

## 2020-10-06 ENCOUNTER — Encounter: Payer: Self-pay | Admitting: Physician Assistant

## 2020-10-06 ENCOUNTER — Ambulatory Visit: Payer: Medicare HMO | Admitting: Physician Assistant

## 2020-10-06 VITALS — BP 110/70 | HR 52 | Ht 73.0 in | Wt 200.1 lb

## 2020-10-06 DIAGNOSIS — I712 Thoracic aortic aneurysm, without rupture, unspecified: Secondary | ICD-10-CM

## 2020-10-06 DIAGNOSIS — Z79899 Other long term (current) drug therapy: Secondary | ICD-10-CM

## 2020-10-06 DIAGNOSIS — R079 Chest pain, unspecified: Secondary | ICD-10-CM | POA: Diagnosis not present

## 2020-10-06 DIAGNOSIS — I77810 Thoracic aortic ectasia: Secondary | ICD-10-CM | POA: Diagnosis not present

## 2020-10-06 DIAGNOSIS — I480 Paroxysmal atrial fibrillation: Secondary | ICD-10-CM

## 2020-10-06 DIAGNOSIS — I2510 Atherosclerotic heart disease of native coronary artery without angina pectoris: Secondary | ICD-10-CM | POA: Diagnosis not present

## 2020-10-06 DIAGNOSIS — I739 Peripheral vascular disease, unspecified: Secondary | ICD-10-CM | POA: Diagnosis not present

## 2020-10-06 DIAGNOSIS — I5020 Unspecified systolic (congestive) heart failure: Secondary | ICD-10-CM

## 2020-10-06 DIAGNOSIS — I429 Cardiomyopathy, unspecified: Secondary | ICD-10-CM

## 2020-10-06 DIAGNOSIS — E785 Hyperlipidemia, unspecified: Secondary | ICD-10-CM

## 2020-10-06 NOTE — Patient Instructions (Signed)
Medication Instructions:  - Your physician has recommended you make the following change in your medication:   1) INCREASE lasix (furosemide) 20 mg: - take 2 tablets (40 mg) once daily x 3 days (Saturday, Sunday, & Monday), then - resume 1 tablet (20 mg) once daily  *If you need a refill on your cardiac medications before your next appointment, please call your pharmacy*   Lab Work: - Your physician recommends that you have lab work today: [facility]/ [place] recommends that you return for lab work in: 1 week (around 10/29)- [place] entrance at [facility] - 1st desk on the right to check in, past the screening table - Lab hours: Monday- Friday (7:30 am- 5:30 pm)  If you have labs (blood work) drawn today and your tests are completely normal, you will receive your results only by: [name] MyChart Message (if you have MyChart) OR . A paper copy in the mail If you have any lab test that is abnormal or we need to change your treatment, we will call you to review the results.   Testing/Procedures: - none ordered   Follow-Up: At [facility], you and your health needs are our priority.  As part of our continuing mission to provide you with exceptional heart care, we have created designated Provider Care Teams.  These Care Teams include your primary Cardiologist (physician) and Advanced Practice Providers (APPs -  Physician Assistants and Nurse Practitioners) who all work together to provide you with the care you need, when you need it.  We recommend signing up for the patient portal called "MyChart".  Sign up information is provided on this After Visit Summary.  MyChart is used to connect with patients for Virtual Visits (Telemedicine).  Patients are able to view lab/test results, encounter notes, upcoming appointments, etc.  Non-urgent messages can be sent to your provider as well.   To learn more about what you can do with MyChart, go to [email or web address].    Your next  appointment:   3 month(s)  The format for your next appointment:   In Person  Provider:   You may see [name], MD or one of the following Advanced Practice Providers on your designated Care Team:    Murray Hodgkins, NP  Christell Faith, PA-C  Marrianne Mood, PA-C  Cadence Kathlen Mody, [place]    Other Instructions 1) Call if your reflux symptoms worsen
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
## 2020-10-07 LAB — CBC WITH DIFFERENTIAL/PLATELET
Basophils Absolute: 0 10*3/uL (ref 0.0–0.2)
Basos: 1 %
EOS (ABSOLUTE): 0.1 10*3/uL (ref 0.0–0.4)
Eos: 3 %
Hematocrit: 41 % (ref 37.5–51.0)
Hemoglobin: 14.1 g/dL (ref 13.0–17.7)
Immature Grans (Abs): 0 10*3/uL (ref 0.0–0.1)
Immature Granulocytes: 0 %
Lymphocytes Absolute: 1.6 10*3/uL (ref 0.7–3.1)
Lymphs: 30 %
MCH: 31.3 pg (ref 26.6–33.0)
MCHC: 34.4 g/dL (ref 31.5–35.7)
MCV: 91 fL (ref 79–97)
Monocytes Absolute: 0.5 10*3/uL (ref 0.1–0.9)
Monocytes: 9 %
Neutrophils Absolute: 3.1 10*3/uL (ref 1.4–7.0)
Neutrophils: 57 %
Platelets: 147 10*3/uL — ABNORMAL LOW (ref 150–450)
RBC: 4.51 x10E6/uL (ref 4.14–5.80)
RDW: 12.6 % (ref 11.6–15.4)
WBC: 5.4 10*3/uL (ref 3.4–10.8)

## 2020-10-07 LAB — BASIC METABOLIC PANEL
BUN/Creatinine Ratio: 16 (ref 10–24)
BUN: 20 mg/dL (ref 8–27)
CO2: 26 mmol/L (ref 20–29)
Calcium: 8.8 mg/dL (ref 8.6–10.2)
Chloride: 101 mmol/L (ref 96–106)
Creatinine, Ser: 1.24 mg/dL (ref 0.76–1.27)
GFR calc Af Amer: 62 mL/min/{1.73_m2} (ref >59)
GFR calc non Af Amer: 54 mL/min/{1.73_m2} — ABNORMAL LOW (ref >59)
Glucose: 115 mg/dL — ABNORMAL HIGH (ref 65–99)
Potassium: 4.3 mmol/L (ref 3.5–5.2)
Sodium: 141 mmol/L (ref 134–144)

## 2020-10-12 ENCOUNTER — Telehealth: Payer: Self-pay | Admitting: *Deleted

## 2020-10-12 DIAGNOSIS — Z79899 Other long term (current) drug therapy: Secondary | ICD-10-CM

## 2020-10-12 DIAGNOSIS — I480 Paroxysmal atrial fibrillation: Secondary | ICD-10-CM

## 2020-10-12 NOTE — Telephone Encounter (Signed)
-----   Message from Arvil Chaco, PA-C sent at 10/09/2020 10:12 AM EDT ----- Anthony Mosley news! Renal function stable. We will recheck this again with repeat BMET in 1 week after short course of 3 days of increased lasix. Blood counts are stable and he is not anemic since starting anticoagulation, which is also good news.

## 2020-10-12 NOTE — Telephone Encounter (Signed)
Left voicemail message for patient to call back for results.  

## 2020-10-13 NOTE — Telephone Encounter (Signed)
Spoke with patient and reviewed lab results and recommendations. He apologized stating that he just returned from fishing trip but would be happy to go on Monday for repeat labs. He verbalized understanding to go on Monday to [facility] entrance with no appointment needed. No further questions at this time.

## 2020-10-16 ENCOUNTER — Other Ambulatory Visit
Admission: RE | Admit: 2020-10-16 | Discharge: 2020-10-16 | Disposition: A | Discharge status: Home / Self Care | Payer: Medicare HMO | Attending: Physician Assistant | Admitting: Physician Assistant

## 2020-10-16 ENCOUNTER — Other Ambulatory Visit: Payer: Self-pay

## 2020-10-16 DIAGNOSIS — Z79899 Other long term (current) drug therapy: Secondary | ICD-10-CM | POA: Diagnosis not present

## 2020-10-16 DIAGNOSIS — I480 Paroxysmal atrial fibrillation: Secondary | ICD-10-CM

## 2020-10-16 LAB — BASIC METABOLIC PANEL
Anion gap: 9 (ref 5–15)
BUN: 20 mg/dL (ref 8–23)
CO2: 33 mmol/L — ABNORMAL HIGH (ref 22–32)
Calcium: 9 mg/dL (ref 8.9–10.3)
Chloride: 99 mmol/L (ref 98–111)
Creatinine, Ser: 1.3 mg/dL — ABNORMAL HIGH (ref 0.61–1.24)
GFR, Estimated: 55 mL/min — ABNORMAL LOW (ref >60)
Glucose, Bld: 95 mg/dL (ref 70–99)
Potassium: 3.9 mmol/L (ref 3.5–5.1)
Sodium: 141 mmol/L (ref 135–145)

## 2020-10-25 ENCOUNTER — Other Ambulatory Visit: Payer: Self-pay

## 2020-10-25 ENCOUNTER — Ambulatory Visit (INDEPENDENT_AMBULATORY_CARE_PROVIDER_SITE_OTHER): Payer: Medicare HMO

## 2020-10-25 DIAGNOSIS — R079 Chest pain, unspecified: Secondary | ICD-10-CM | POA: Diagnosis not present

## 2020-10-25 DIAGNOSIS — I2510 Atherosclerotic heart disease of native coronary artery without angina pectoris: Secondary | ICD-10-CM | POA: Diagnosis not present

## 2020-10-25 DIAGNOSIS — I77810 Thoracic aortic ectasia: Secondary | ICD-10-CM

## 2020-10-25 LAB — ECHOCARDIOGRAM COMPLETE
AR max vel: 2.61 cm2
AV Area VTI: 2.77 cm2
AV Area mean vel: 2.55 cm2
AV Mean grad: 3 mmHg
AV Peak grad: 5.8 mmHg
Ao pk vel: 1.2 m/s
Calc EF: 38.4 %
P 1/2 time: 718 msec
S' Lateral: 4.7 cm
Single Plane A2C EF: 40.4 %
Single Plane A4C EF: 37.1 %

## 2020-12-28 DIAGNOSIS — D3132 Benign neoplasm of left choroid: Secondary | ICD-10-CM | POA: Diagnosis not present

## 2021-01-03 IMAGING — CT CT ANGIO CHEST
2 of 6 series · 13 of 36 positions shown · IV contrast (iopamidol)
Comparison: 12/23/2017

Addendum:
CLINICAL DATA: Follow-up thoracic aortic aneurysm

EXAM:
CT ANGIOGRAPHY CHEST WITH CONTRAST
TECHNIQUE: Multidetector CT imaging of the chest was performed using the
standard protocol during bolus administration of intravenous
contrast. Multiplanar CT image reconstructions and MIPs were
obtained to evaluate the vascular anatomy.
CONTRAST:  75mL GEAQA9-DRH IOPAMIDOL (GEAQA9-DRH) INJECTION 76%

[Series 5: axial arterial cta thorax · axial · arterial · 0.72mm/px · z∈[-1373,-1025]mm · 12 of 206 slices shown]
[im 16/206  lung]
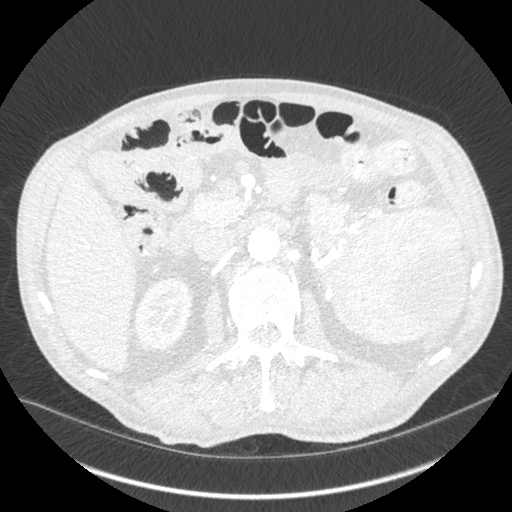
[im 32/206  mediastinal]
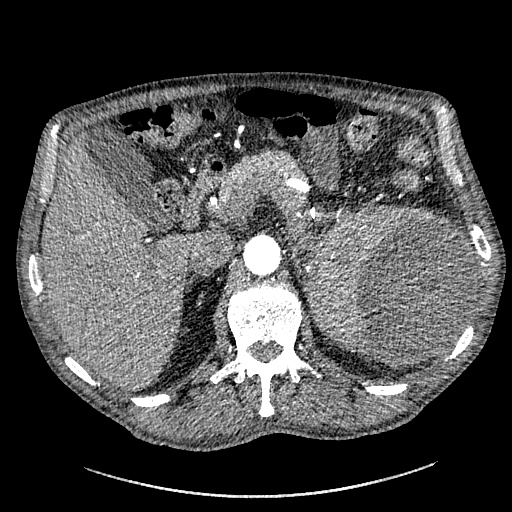
[im 48/206  lung]
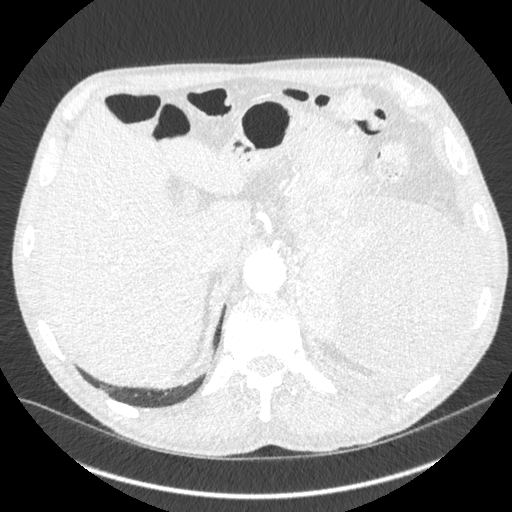
[im 64/206  mediastinal]
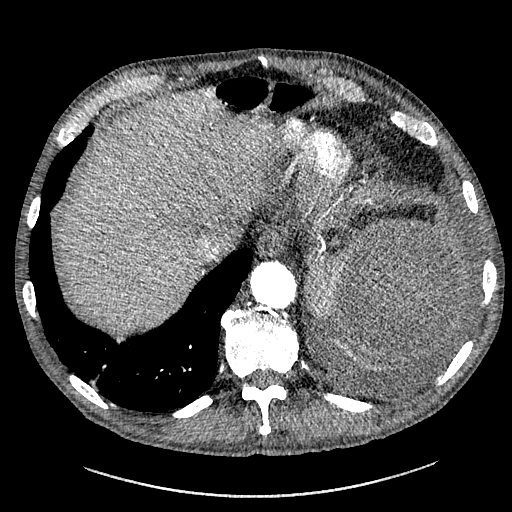
[im 79/206  lung]
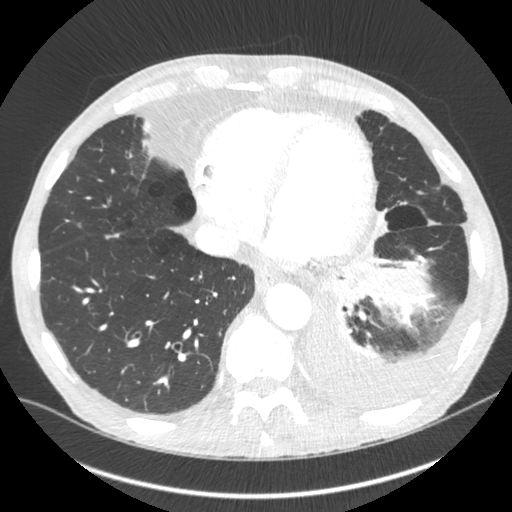
[im 95/206  mediastinal]
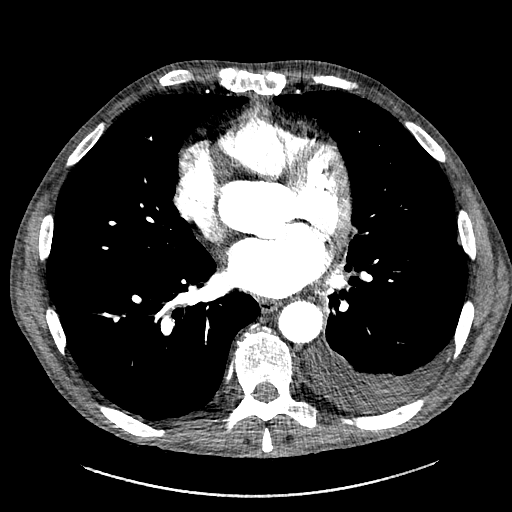
[im 111/206  lung]
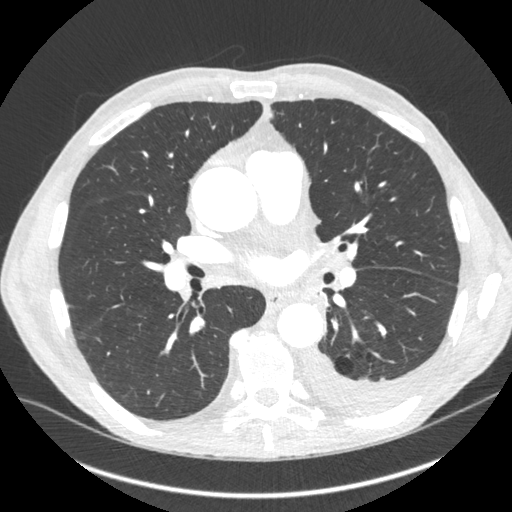
[im 127/206  mediastinal]
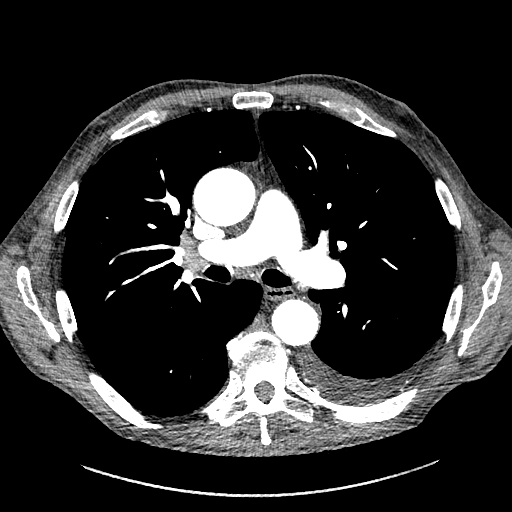
[im 142/206  lung]
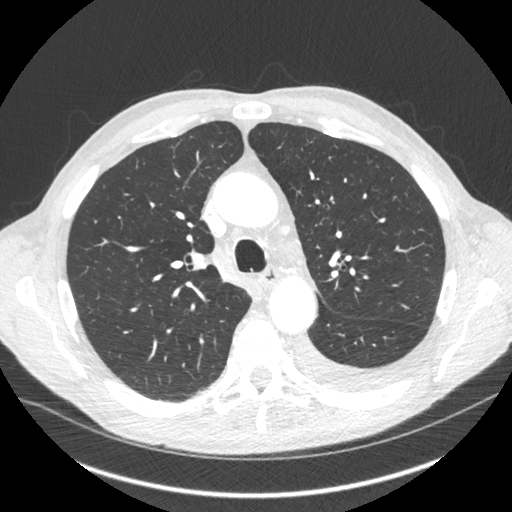
[im 158/206  mediastinal]
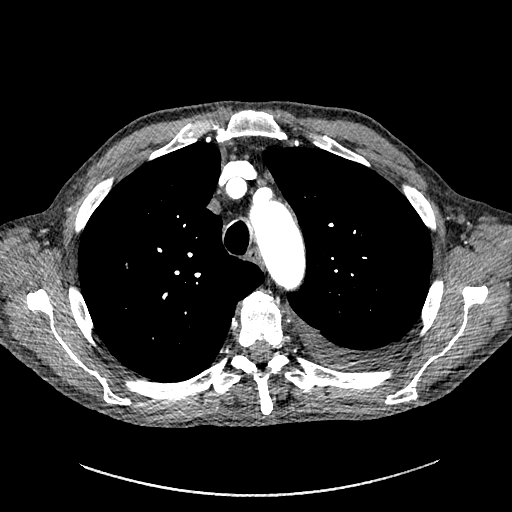
[im 174/206  lung]
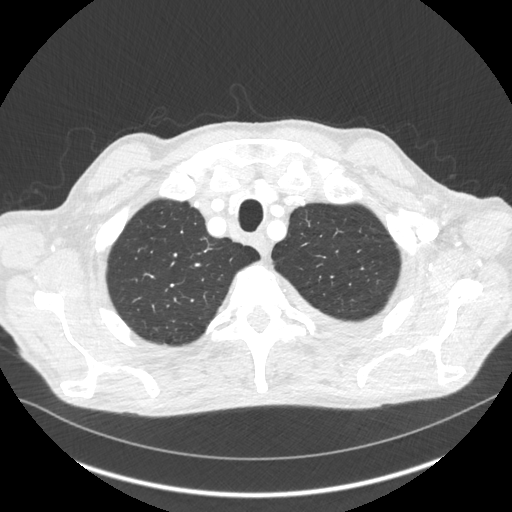
[im 190/206  mediastinal]
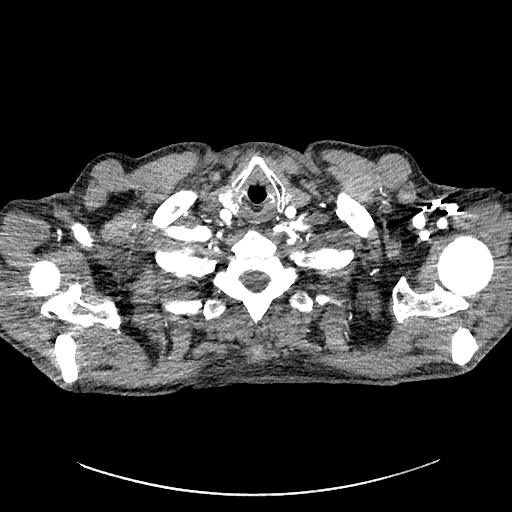

[Series 8: cor st cta thorax · coronal · 0.72mm/px · 1 of 154 slices shown]
[im 77/154  mediastinal]
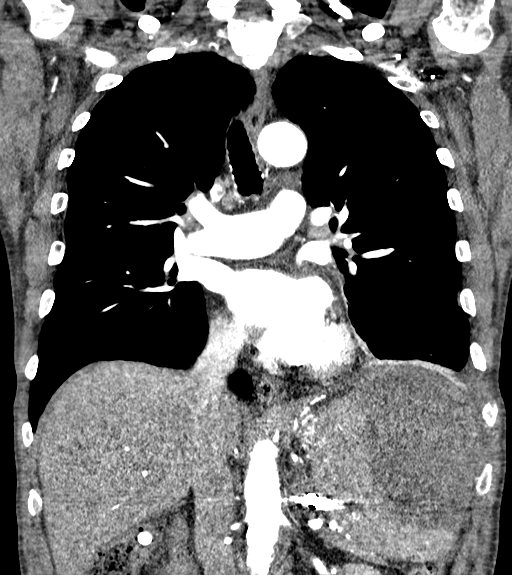

[13 of 36 positions shown; findings below may reference images not displayed]

FINDINGS: Cardiovascular: : Ascending aorta aneurysmal at 4.4 cm, stable.
Heart is normal size. Densely calcified coronary vessels. Scattered
aortic calcifications.

Mediastinum/Nodes: No mediastinal, hilar, or axillary adenopathy.

Lungs/Pleura: New moderate left pleural effusion. Atelectasis in the
left base. Scarring in the right lung base. Biapical scarring. Small
scattered calcified pleural plaques bilaterally.

Upper Abdomen: Large apparent fluid collection with in the left
upper quadrant adjacent to the spleen, displacing the spleen
medially and deforming the spleen most compatible with a subcapsular
fluid collection. This measures 13 x 12 by 10 cm. The density is not
that of simple fluid. This likely reflects a subcapsular hematoma.
This is new since prior study. Splenic artery coils are also noted,
new since prior CT.

Musculoskeletal: Lower left rib fractures are noted laterally
through the 9th through 11th ribs. These are age indeterminate but
new since prior CT.

Review of the MIP images confirms the above findings.
IMPRESSION: Stable ascending thoracic aortic aneurysm, 4.4 cm. Recommend annual
imaging followup by CTA or MRA. This recommendation follows 6151
ACCF/AHA/AATS/ACR/ASA/SCA/CELESTINE/MODI/DJECJI/LERIEL Guidelines for the
Diagnosis and Management of Patients with Thoracic Aortic Disease.
Circulation. 6151; 121: [id number]

New moderate left pleural effusion.  Left base atelectasis.

Interval splenic artery embolization. There is a large mixed density
fluid collection adjacent to the spleen most compatible with
subcapsular hematoma measuring up to 13 cm.

Diffuse dense coronary artery calcifications/disease.

Scattered small calcified pleural plaques.

Left lateral rib fractures involving the 9th through 11th ribs, new
since prior study.

ADDENDUM:
There is an available abdominal CT from 11/10/2018. The large
subcapsular fluid collection/hematoma adjacent to the spleen was
present on that prior abdominal CT and measured 14.9 x 11.6 x
cm. Therefore, the fluid collection has decreased somewhat in size
on today's study. The left effusion was also present at that time
and is likely stable.

*** End of Addendum ***

## 2021-01-11 ENCOUNTER — Telehealth: Payer: Self-pay | Admitting: Internal Medicine

## 2021-01-11 NOTE — Telephone Encounter (Signed)
Patient is calling to speak to Caryl Pina to establish care - Patient was advised that Dr. Raliegh Ip is not accepting new patients at this time. Cb- [phone]

## 2021-01-12 ENCOUNTER — Ambulatory Visit: Payer: Medicare HMO | Admitting: Internal Medicine

## 2021-01-17 ENCOUNTER — Other Ambulatory Visit: Payer: Self-pay

## 2021-01-17 ENCOUNTER — Ambulatory Visit: Payer: Medicare HMO | Admitting: Physician Assistant

## 2021-01-17 ENCOUNTER — Encounter: Payer: Self-pay | Admitting: Physician Assistant

## 2021-01-17 VITALS — BP 104/62 | HR 79 | Ht 73.0 in | Wt 203.8 lb

## 2021-01-17 DIAGNOSIS — E785 Hyperlipidemia, unspecified: Secondary | ICD-10-CM | POA: Diagnosis not present

## 2021-01-17 DIAGNOSIS — I77810 Thoracic aortic ectasia: Secondary | ICD-10-CM | POA: Diagnosis not present

## 2021-01-17 DIAGNOSIS — R29898 Other symptoms and signs involving the musculoskeletal system: Secondary | ICD-10-CM

## 2021-01-17 DIAGNOSIS — I83813 Varicose veins of bilateral lower extremities with pain: Secondary | ICD-10-CM | POA: Diagnosis not present

## 2021-01-17 DIAGNOSIS — I471 Supraventricular tachycardia: Secondary | ICD-10-CM

## 2021-01-17 DIAGNOSIS — I712 Thoracic aortic aneurysm, without rupture, unspecified: Secondary | ICD-10-CM

## 2021-01-17 DIAGNOSIS — I2510 Atherosclerotic heart disease of native coronary artery without angina pectoris: Secondary | ICD-10-CM | POA: Diagnosis not present

## 2021-01-17 DIAGNOSIS — I5022 Chronic systolic (congestive) heart failure: Secondary | ICD-10-CM

## 2021-01-17 DIAGNOSIS — I4719 Other supraventricular tachycardia: Secondary | ICD-10-CM

## 2021-01-17 DIAGNOSIS — I428 Other cardiomyopathies: Secondary | ICD-10-CM

## 2021-01-17 DIAGNOSIS — I5020 Unspecified systolic (congestive) heart failure: Secondary | ICD-10-CM

## 2021-01-17 DIAGNOSIS — Z79899 Other long term (current) drug therapy: Secondary | ICD-10-CM

## 2021-01-17 DIAGNOSIS — I480 Paroxysmal atrial fibrillation: Secondary | ICD-10-CM

## 2021-01-17 DIAGNOSIS — I491 Atrial premature depolarization: Secondary | ICD-10-CM

## 2021-01-17 NOTE — Patient Instructions (Signed)
Medication Instructions:  Your physician recommends that you continue on your current medications as directed. Please refer to the Current Medication list given to you today.  *We are going to discuss Eliquis coverage with our PharmD and will be in contact with you regarding solution options.   *If you need a refill on your cardiac medications before your next appointment, please call your pharmacy*   Lab Work:  -  Your physician recommends that you return for lab work to the [facility] at [facility] for [place], [place] -  Please go to the [facility]. You will check in at the front desk to the right as you walk into the atrium. Valet Parking is offered if needed. - No appointment needed. You may go any day between 7 am and 6 pm.   Testing/Procedures: None ordered   Follow-Up: At [facility], you and your health needs are our priority.  As part of our continuing mission to provide you with exceptional heart care, we have created designated Provider Care Teams.  These Care Teams include your primary Cardiologist (physician) and Advanced Practice Providers (APPs -  Physician Assistants and Nurse Practitioners) who all work together to provide you with the care you need, when you need it.  We recommend signing up for the patient portal called "MyChart".  Sign up information is provided on this After Visit Summary.  MyChart is used to connect with patients for Virtual Visits (Telemedicine).  Patients are able to view lab/test results, encounter notes, upcoming appointments, etc.  Non-urgent messages can be sent to your provider as well.   To learn more about what you can do with MyChart, go to [email or web address].    Your next appointment:   6 month(s)  The format for your next appointment:   In Person  Provider:   You may see [name], MD or one of the following Advanced Practice Providers on your designated Care Team:    Murray Hodgkins, NP  Christell Faith, PA-C  Marrianne Mood, PA-C  Cadence Alba, [place]  Laurann [place], NP

## 2021-01-17 NOTE — Progress Notes (Signed)
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 cost of Eliquis too great? Amiodarone? ? [facility]B Disposition: RTC 6 months or sooner if needed.     Arvil Chaco, PA-C

## 2021-01-18 ENCOUNTER — Other Ambulatory Visit
Admission: RE | Admit: 2021-01-18 | Discharge: 2021-01-18 | Disposition: A | Discharge status: Home / Self Care | Payer: Medicare HMO | Source: Ambulatory Visit | Attending: Physician Assistant | Admitting: Physician Assistant

## 2021-01-18 ENCOUNTER — Telehealth: Payer: Self-pay | Admitting: Pharmacist

## 2021-01-18 DIAGNOSIS — I2510 Atherosclerotic heart disease of native coronary artery without angina pectoris: Secondary | ICD-10-CM | POA: Diagnosis not present

## 2021-01-18 DIAGNOSIS — I5022 Chronic systolic (congestive) heart failure: Secondary | ICD-10-CM | POA: Insufficient documentation

## 2021-01-18 DIAGNOSIS — Z79899 Other long term (current) drug therapy: Secondary | ICD-10-CM | POA: Diagnosis not present

## 2021-01-18 DIAGNOSIS — I480 Paroxysmal atrial fibrillation: Secondary | ICD-10-CM | POA: Diagnosis not present

## 2021-01-18 LAB — COMPREHENSIVE METABOLIC PANEL
ALT: 19 U/L (ref 0–44)
AST: 24 U/L (ref 15–41)
Albumin: 3.8 g/dL (ref 3.5–5.0)
Alkaline Phosphatase: 64 U/L (ref 38–126)
Anion gap: 9 (ref 5–15)
BUN: 25 mg/dL — ABNORMAL HIGH (ref 8–23)
CO2: 30 mmol/L (ref 22–32)
Calcium: 8.9 mg/dL (ref 8.9–10.3)
Chloride: 102 mmol/L (ref 98–111)
Creatinine, Ser: 1.55 mg/dL — ABNORMAL HIGH (ref 0.61–1.24)
GFR, Estimated: 44 mL/min — ABNORMAL LOW (ref >60)
Glucose, Bld: 122 mg/dL — ABNORMAL HIGH (ref 70–99)
Potassium: 4.3 mmol/L (ref 3.5–5.1)
Sodium: 141 mmol/L (ref 135–145)
Total Bilirubin: 1.2 mg/dL (ref 0.3–1.2)
Total Protein: 6.6 g/dL (ref 6.5–8.1)

## 2021-01-18 LAB — TSH: TSH: 4.246 u[IU]/mL (ref 0.350–4.500)

## 2021-01-18 NOTE — Telephone Encounter (Signed)
I spoke with patient- he is ok with paying the copay for Eliquis. His wife was on what sounds like clopidogrel and was not paying anything for it. Explained that clopidogrel and Eliquis are used to treat two different things.  We did discuss warfarin, but patient does not want all the hassle of monitoring. He is ok with paying for the Eliquis.

## 2021-01-18 NOTE — Telephone Encounter (Signed)
Called pt to discuss warfarin vs DOAC. Left VM for pt to call back.

## 2021-01-19 ENCOUNTER — Telehealth: Payer: Self-pay | Admitting: *Deleted

## 2021-01-19 MED ORDER — AMIODARONE HCL 200 MG PO TABS: 200.0000 mg | ORAL_TABLET | Freq: Every day | ORAL | 3 refills | Status: DC

## 2021-01-19 MED ORDER — APIXABAN 2.5 MG PO TABS: 2.5000 mg | ORAL_TABLET | Freq: Two times a day (BID) | ORAL | 3 refills | Status: DC

## 2021-01-19 MED ORDER — AMIODARONE HCL 200 MG PO TABS: ORAL_TABLET | ORAL | 0 refills | Status: DC

## 2021-01-19 NOTE — Telephone Encounter (Signed)
Spoke to pt, notified of lab results and provider's recc.  Pt is agreeable to starting Amiodarone. Pt requests that we send in loading dose for first 2 weeks to [facility] and send Amiodarone 200mg  daily to [facility] for 90 day supply.   Pt also agreeable to continuing Eliquis vs Xarelto. He did speak w/ Pharm D and confirmed that he is ok with cost of medication at this time. Pt will decr Eliquis to 2.5mg  TWICE daily. Pt aware to wait for NEW Rx for this dose change. Rx sent to [facility] per pt request.   Pt also verbalized understanding to instructions regarding hydration.  Pt has no further questions at this time.

## 2021-01-19 NOTE — Telephone Encounter (Signed)
-----   Message from Arvil Chaco, PA-C sent at 01/19/2021  4:41 PM EST ----- Labs showed --Thyroid function within normal range. --Electrolytes stable. --Liver function stable. --Kidney function slightly bumped from that of previous labs.  Recommendations --Stay well-hydrated (still avoid salt /  keep total fluids under 2 L per day). --Reduce to Eliquis 2.5 mg twice daily (meets criteria due to age and kidney function) --If agreeable, start amiodarone 400 mg BID x 7 days then amiodarone 200 mg BID x7 days then amiodarone 200 mg daily thereafter. This is the medication we discussed in clinic together to control his heart rhythm and prevent those extra beats from the top part of the heart.  At his last visit, he mentioned that Eliquis was pricey with his current insurance.  Do we know if Xarelto is cheaper with his insurance?

## 2021-01-23 NOTE — Telephone Encounter (Signed)
Spoke to pt yesterday, 01/22/21, to discuss Elenor Quinones, PA recc for pt to have repeat EKG and repeat Bmet in 2 weeks, after completed loading dose of Amiodarone d/t h/o long QT syndrome.   Pt proceeded to tell me that he decided NOT to start Amiodarone over the weekend. States after reading medication insert, became very concerned with side effects and is hesitant to start medication. Asked pt what specific questions he had that I may be able to answer for him. Pt states that he would rather speak with a provider regarding several concerns he has prior to starting med.   Notified Elenor Quinones, PA of pt's concerns. JV did review amiodarone with pt at his clinic visit and he expressed hesitation regarding the side effects at that time as well. Offers to reach out to pt or schedule appt according to pt's preference.   Pt agrees to schedule office visit to discuss in length his concerns. Scheduled appt for tomorrow, 2/9 at 9:00 AM with Dr. Saunders Revel for in office visit. Pt verbalized understanding and appreciative for assistance.

## 2021-01-24 ENCOUNTER — Encounter: Payer: Self-pay | Admitting: Internal Medicine

## 2021-01-24 ENCOUNTER — Ambulatory Visit: Payer: Medicare HMO | Admitting: Internal Medicine

## 2021-01-24 ENCOUNTER — Other Ambulatory Visit: Payer: Self-pay

## 2021-01-24 VITALS — BP 100/62 | HR 79 | Ht 72.0 in | Wt 203.0 lb

## 2021-01-24 DIAGNOSIS — I491 Atrial premature depolarization: Secondary | ICD-10-CM

## 2021-01-24 DIAGNOSIS — I5022 Chronic systolic (congestive) heart failure: Secondary | ICD-10-CM | POA: Diagnosis not present

## 2021-01-24 DIAGNOSIS — I712 Thoracic aortic aneurysm, without rupture, unspecified: Secondary | ICD-10-CM

## 2021-01-24 DIAGNOSIS — I2510 Atherosclerotic heart disease of native coronary artery without angina pectoris: Secondary | ICD-10-CM

## 2021-01-24 DIAGNOSIS — I480 Paroxysmal atrial fibrillation: Secondary | ICD-10-CM

## 2021-01-24 NOTE — Patient Instructions (Signed)
Medication Instructions:  Your physician has recommended you make the following change in your medication:   STOP Amiodarone.  Continue your other medications as prescribed.  *If you need a refill on your cardiac medications before your next appointment, please call your pharmacy*   Lab Work: None ordered If you have labs (blood work) drawn today and your tests are completely normal, you will receive your results only by: [name] MyChart Message (if you have MyChart) OR . A paper copy in the mail If you have any lab test that is abnormal or we need to change your treatment, we will call you to review the results.   Testing/Procedures: None ordered   Follow-Up: At [facility], you and your health needs are our priority.  As part of our continuing mission to provide you with exceptional heart care, we have created designated Provider Care Teams.  These Care Teams include your primary Cardiologist (physician) and Advanced Practice Providers (APPs -  Physician Assistants and Nurse Practitioners) who all work together to provide you with the care you need, when you need it.  We recommend signing up for the patient portal called "MyChart".  Sign up information is provided on this After Visit Summary.  MyChart is used to connect with patients for Virtual Visits (Telemedicine).  Patients are able to view lab/test results, encounter notes, upcoming appointments, etc.  Non-urgent messages can be sent to your provider as well.   To learn more about what you can do with MyChart, go to [email or web address].    Your next appointment:   6 month(s)  The format for your next appointment:   In Person  Provider:   You may see [name], MD or one of the following Advanced Practice Providers on your designated Care Team:    Murray Hodgkins, NP  Christell Faith, PA-C  Marrianne Mood, PA-C  Cadence [place], [place]  Laurann Montana, NP    Other Instructions N/A

## 2021-01-24 NOTE — Progress Notes (Signed)
Follow-up Outpatient Visit Date: 01/24/2021  Primary Care Provider: Towanda Malkin, MD (Inactive) No address on file  Chief Complaint: Concerns about amiodarone  HPI:  Anthony Mosley is a 85 y.o. male with history of coronary artery disease, chronic HFrEF with partially recovered LVEF, MAT versus PAF, pulmonary hypertension, thoracic aortic aneurysm, mild to moderate mitral regurgitation, hyperlipidemia, and chronic kidney disease stage III, who presents for further discussion of atrial arrhythmias and possible initiation of amiodarone.  He was seen by Marrianne Mood, PA, last week at which time he was feeling relatively well.  Previous atypical chest pain had resolved.  EKG showed sinus rhythm with frequent PACs and left bundle branch block, prompting Ms. Visser to recommend initiation of amiodarone.  Anthony Mosley has been on apixaban for history of PAF.  He contacted our office, wishing to discuss initiation of amiodarone in further detail.  Specifically, he is worried about photosensitivity noted in the side effect list.  He spends a lot of his time gardening and does not believe that he can avoid direct sunlight.  He is only taken 1 dose of amiodarone.  Overall, Anthony Mosley reports feeling fairly well.  He denies chest pain, shortness of breath, and edema.  He noted mild swelling and weight gain about a month ago when his wife died, as he did not take furosemide for several days.  He is now taking it most days again.  He has rare episodes of lightheadedness as well as an occasional "numb" feeling across the back of his head.  He has not fallen or passed out.  He denies palpitations.  He remains on apixaban and is willing to continue paying for this.  --------------------------------------------------------------------------------------------------  Past Medical History:  Diagnosis Date  . Arthritis   . 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 seen on CT.  [name] CAD (coronary artery disease)    a.   Elective R/LHC 06/12/18 showed mild-moderate nonobstructive RCA CAD with mild LAD myocardial bridging but no significant obstructive CAD; there was mildly elevated L heart filling pressure, moderately elevated R heart filling pressure, and mild pulm HTN.  . Cancer ([place])    SKIN  . Chronic knee pain   . Cough   . Edema    FEET/LEGS  . Former tobacco use   . Hemorrhoids   . Hyperbilirubinemia   . Hyperlipidemia   . Mild aortic insufficiency   . Mild pulmonary hypertension ([place])   . Moderate mitral regurgitation   . NICM (nonischemic cardiomyopathy) ([place])    a. EF 40-45% by echo 05/2017.  [name] Pleural effusion, left 11/2018  . Pneumonia   . PONV (postoperative nausea and vomiting)    after first cataract  . Rosacea   . Ruptured spleen   . Thoracic aortic aneurysm ([place])    a. 4.4cm by CT 12/2017, imaged incompletely at 4.3cm in 10/2018 CT.  [name] Wears dentures    partial upper  . Wheezing    Past Surgical History:  Procedure Laterality Date  . APPENDECTOMY     Dr Emilio Math  . CARDIAC CATHETERIZATION  2005  . CARDIAC CATHETERIZATION  2002  . CATARACT EXTRACTION W/PHACO Right 10/24/2015   Procedure: CATARACT EXTRACTION PHACO AND INTRAOCULAR LENS PLACEMENT (IOC);  Surgeon: [name], MD;  Location: [facility];  Service: Ophthalmology;  Laterality: Right;  [place] 00:52 AP% 23.5 CDE 12.35 fluid pack lot # [id number]  . CATARACT EXTRACTION W/PHACO Left 11/14/2015   Procedure: CATARACT EXTRACTION PHACO AND INTRAOCULAR LENS PLACEMENT (IOC);  Surgeon: [name], MD;  Location: [facility];  Service: Ophthalmology;  Laterality: Left;  [place] 00:55 AP% 19.1 CDE 10.66 fluid pack lot # [id number]  . COLONOSCOPY  1987  . COLONOSCOPY WITH PROPOFOL N/A 08/22/2017   Procedure: COLONOSCOPY WITH PROPOFOL;  Surgeon: [name], MD;  Location: [facility];  Service: Gastroenterology;  Laterality: N/A;  . EYE SURGERY    . HERNIA REPAIR     umbilical/ Dr Emilio Math  . INGUINAL HERNIA REPAIR Right  02/04/2019   Procedure: HERNIA REPAIR INGUINAL WITH MESH;  Surgeon: Olean Ree, MD;  Location: [facility];  Service: General;  Laterality: Right;  . IR ANGIOGRAM VISCERAL SELECTIVE  10/29/2018  . IR EMBO ART  VEN HEMORR LYMPH EXTRAV  INC GUIDE ROADMAPPING  10/29/2018  . IR THORACENTESIS ASP PLEURAL SPACE W/IMG GUIDE  11/23/2018  . IR US GUIDE VASC ACCESS RIGHT  10/29/2018  . KNEE ARTHROSCOPY Bilateral   . POLYPECTOMY  08/22/2017   Procedure: POLYPECTOMY;  Surgeon: [name], MD;  Location: [facility];  Service: Gastroenterology;;  . RIGHT/LEFT HEART CATH AND CORONARY ANGIOGRAPHY N/A 06/13/2017   Procedure: Right/Left Heart Cath and Coronary Angiography;  Surgeon: [name], MD;  Location: [facility];  Service: Cardiovascular;  Laterality: N/A;  . TONSILLECTOMY      Current Meds  Medication Sig  . apixaban (ELIQUIS) 2.5 MG TABS tablet Take 1 tablet (2.5 mg total) by mouth 2 (two) times daily.  [name] atorvastatin (LIPITOR) 80 MG tablet Take 1 tablet (80 mg total) by mouth daily.  . B COMPLEX VITAMINS PO Take 1 tablet by mouth daily.   . Carboxymethylcellul-Glycerin (LUBRICATING EYE DROPS OP) Apply 1 drop to eye as needed (dry eyes).   . cholecalciferol (VITAMIN D3) 25 MCG (1000 UNIT) tablet Take 1,000 Units by mouth daily.  . diclofenac sodium (VOLTAREN) 1 % GEL Apply 4 g topically 4 (four) times daily. To the knee if needed  . furosemide (LASIX) 20 MG tablet Take 20 mg by mouth daily as needed.  . Guaifenesin 1200 MG TB12 Take 1,200 mg by mouth at bedtime.    Allergies: Macrolides and ketolides, Norvasc [amlodipine], Avodart [dutasteride], and Tape  Social History   Tobacco Use  . Smoking status: Former Smoker    Packs/day: 1.50    Years: 20.00    Pack years: 30.00    Types: Cigarettes    Quit date: 1987    Years since quitting: 35.1  . Smokeless tobacco: Former Systems developer    Types: Chew    Quit date: 2000  . Tobacco comment: smoking cessation materials not  required  Vaping Use  . Vaping Use: Never used  Substance Use Topics  . Alcohol use: No    Alcohol/week: 0.0 standard drinks  . Drug use: No    Family History  Problem Relation Age of Onset  . Alzheimer's disease Mother   . Pulmonary embolism Father   . Diabetes Father   . Heart attack Father 26  . Diabetes Brother   . Heart disease Brother        CABG  . Heart disease Brother        CABG  . Diabetes Brother   . Healthy Brother     Review of Systems: A 12-system review of systems was performed and was negative except as noted in the HPI.  --------------------------------------------------------------------------------------------------  Physical Exam: BP 100/62 (BP Location: Left Arm, Patient Position: Sitting, Cuff Size: Normal)   Pulse 79   Ht 6' (  1.829 m)   Wt 203 lb (92.1 kg)   BMI 27.53 kg/m   General:  NAD. Neck: No JVD or HJR. Lungs: Clear to auscultation bilaterally without wheezes or crackles. Heart: Regular rate and rhythm with occasional extrasystoles.  2/6 systolic murmur noted. Abdomen: Soft, nontender, nondistended. Extremities: No lower extremity edema.  EKG: Normal sinus rhythm with frequent PACs, left axis deviation, and IVCD.  Lab Results  Component Value Date   WBC 5.4 10/06/2020   HGB 14.1 10/06/2020   HCT 41.0 10/06/2020   MCV 91 10/06/2020   PLT 147 (L) 10/06/2020    Lab Results  Component Value Date   NA 141 01/18/2021   K 4.3 01/18/2021   CL 102 01/18/2021   CO2 30 01/18/2021   BUN 25 (H) 01/18/2021   CREATININE 1.55 (H) 01/18/2021   GLUCOSE 122 (H) 01/18/2021   ALT 19 01/18/2021    Lab Results  Component Value Date   CHOL 137 09/06/2020   HDL 49 09/06/2020   LDLCALC 78 09/06/2020   TRIG 40 09/06/2020   CHOLHDL 2.8 09/06/2020    --------------------------------------------------------------------------------------------------  ASSESSMENT AND PLAN: Frequent PACs and paroxysmal atrial fibrillation: EKG today again  demonstrates frequent PACs but no evidence of atrial fibrillation.  As he is minimal symptomatic and concerned about potential side effects related to amiodarone use, I think it is best to avoid taking this medication long-term.  If he were to have symptoms or persistent tachycardia, we would need to readdress this.  Borderline low blood pressure as well as intermittent orthostatic lightheadedness preclude use of a beta-blocker or calcium channel blocker.  We will plan to continue indefinite anticoagulation with apixaban.  This is currently at 2.5 mg daily based on age and creatinine (1.6 last week).  Chronic HFrEF with recovered ejection fraction: Anthony Mosley appears euvolemic on examination today.  He reports NYHA class II symptoms with occasional lower extremity edema if he does not take furosemide for several days.  I encouraged him to continue using furosemide as needed for edema/weight gain.  He should maintain adequate hydration especially when he is outside in order to minimize risk for orthostatic hypotension.  Coronary artery disease: No angina reported.  Continue secondary prevention, including apixaban in lieu of aspirin with history of PAF.  Thoracic aortic aneurysm: Most recent CTA chest in 08/2020 showed slight interval enlargement of a sending aortic aneurysm measuring 4.5 cm.  Plan for repeat CT imaging in 6 months.  Follow-up: Return to clinic in 6 months.  [name], MD 01/24/2021 1:33 PM

## 2021-01-26 ENCOUNTER — Ambulatory Visit: Payer: Medicare HMO | Admitting: Cardiology

## 2021-02-02 ENCOUNTER — Other Ambulatory Visit: Payer: Self-pay

## 2021-02-02 ENCOUNTER — Ambulatory Visit (INDEPENDENT_AMBULATORY_CARE_PROVIDER_SITE_OTHER): Payer: Medicare HMO | Admitting: Family Medicine

## 2021-02-02 ENCOUNTER — Encounter: Payer: Self-pay | Admitting: Family Medicine

## 2021-02-02 VITALS — BP 121/69 | HR 54 | Ht 73.0 in | Wt 204.8 lb

## 2021-02-02 DIAGNOSIS — I5020 Unspecified systolic (congestive) heart failure: Secondary | ICD-10-CM | POA: Diagnosis not present

## 2021-02-02 DIAGNOSIS — E785 Hyperlipidemia, unspecified: Secondary | ICD-10-CM | POA: Diagnosis not present

## 2021-02-02 DIAGNOSIS — M170 Bilateral primary osteoarthritis of knee: Secondary | ICD-10-CM

## 2021-02-02 DIAGNOSIS — F330 Major depressive disorder, recurrent, mild: Secondary | ICD-10-CM

## 2021-02-02 DIAGNOSIS — M25562 Pain in left knee: Secondary | ICD-10-CM

## 2021-02-02 DIAGNOSIS — I2510 Atherosclerotic heart disease of native coronary artery without angina pectoris: Secondary | ICD-10-CM | POA: Diagnosis not present

## 2021-02-02 DIAGNOSIS — R7303 Prediabetes: Secondary | ICD-10-CM

## 2021-02-02 DIAGNOSIS — N183 Chronic kidney disease, stage 3 unspecified: Secondary | ICD-10-CM

## 2021-02-02 DIAGNOSIS — Z7689 Persons encountering health services in other specified circumstances: Secondary | ICD-10-CM

## 2021-02-02 DIAGNOSIS — G8929 Other chronic pain: Secondary | ICD-10-CM

## 2021-02-02 DIAGNOSIS — I712 Thoracic aortic aneurysm, without rupture, unspecified: Secondary | ICD-10-CM

## 2021-02-02 DIAGNOSIS — M25561 Pain in right knee: Secondary | ICD-10-CM | POA: Diagnosis not present

## 2021-02-02 DIAGNOSIS — I480 Paroxysmal atrial fibrillation: Secondary | ICD-10-CM

## 2021-02-02 NOTE — Patient Instructions (Addendum)
Thank you for coming to the office today.  Recommend to start taking Tylenol Extra Strength 500mg  tabs - take 1 to 2 tabs per dose (max 1000mg ) every 6-8 hours for pain (take regularly, don't skip a dose for next 7 days), max 24 hour daily dose is 6 tablets or 3000mg . In the future you can repeat the same everyday Tylenol course for 1-2 weeks at a time.   Recommend keep using topical diclofenac   Avoid NSAID (Aleve, Advil, Ibuprofen, Motrin)  Do not take these medications.  Referral to [facility] - stay tuned for updates. They will call with apt.  Please schedule a Follow-up Appointment to: Return in about 3 months (around 05/02/2021) for 3 month follow-up HTN, PreDM, Arthritis Knees.  If you have any other questions or concerns, please feel free to call the office or send a message through [facility]. You may also schedule an earlier appointment if necessary.  Additionally, you may be receiving a survey about your experience at our office within a few days to 1 week by e-mail or mail. We value your feedback.  Nobie Putnam, DO [place]

## 2021-02-02 NOTE — Progress Notes (Signed)
Subjective:    Patient ID: Anthony Mosley, male    DOB: 19-Apr-1935, 86 y.o.   MRN: [id number]  Anthony Mosley is a 86 y.o. male presenting on 02/02/2021 for Establish Care and Knee Pain (Joints ache)   HPI  Cardiology CAD Heart Failure w Reduced Ejection Fracture Paroxysmal Atrial Fibrillation Thoracic Aortic Aneurysm  Followed by Cardiology Dr Saunders Revel and Marrianne Mood PA Recently 01/24/21 he was initially on amiodarone but then taken off of this. He was placed on Apixban Eliquis 2.5mg  daily based on age and creatinine  racic aortic aneurysm: Most recent CTA chest in 08/2020 showed slight interval enlargement of a sending aortic aneurysm measuring 4.5 cm.  Plan for repeat CT imaging in 6 months.  Chronic Knee Pain, bilateral lower ext Episodic lower ext pains. If he is very active outdoors, he can use a topical salve / Diclofenac (Voltaren) to help. He does have some issue with pain in knee with change of position. Can get a sharp pain at times if moves knee sideways. If he is walking for longer period of time he can feel symptoms. He has used a knee sleeve at times. He does not take any NSAIDs. He has seen [facility] - Vance Peper PA and Dr Marry Guan. He has had history of cartilage meniscus knee surgery arthroscopic, he has not had any knee replacement. He has had cortisone injections in past with mixed results.  Previously followed by Vascular - Dr Lucky Cowboy, he has had circulation evaluation and it checked out well. No arterial insufficiency. Thought to be possible neuropathic.  CKD-III Stable, previous issue.  HYPERLIPIDEMIA: - Reports no concerns - Currently taking Atorvastatin 80mg , tolerating well without side effects or myalgias  Major depression recurrent, mild Currently not on therapy Mood is stable, see below  Depression screen [facility] 2/9 02/02/2021 09/07/2020 08/08/2020  Decreased Interest 0 0 0  Down, Depressed, Hopeless 0 1 1  PHQ - 2 Score 0 1 1  Altered  sleeping 3 - 1  Tired, decreased energy 2 - 1  Change in appetite 0 - 0  Feeling bad or failure about yourself  0 - 0  Trouble concentrating 0 - 0  Moving slowly or fidgety/restless 1 - 0  Suicidal thoughts 0 - 0  PHQ-9 Score 6 - 3  Difficult doing work/chores Not difficult at all - Not difficult at all  Some recent data might be hidden   GAD 7 : Generalized Anxiety Score 02/02/2021  Nervous, Anxious, on Edge 0  Control/stop worrying 0  Worry too much - different things 0  Trouble relaxing 0  Restless 0  Easily annoyed or irritable 2  Afraid - awful might happen 0  Total GAD 7 Score 2  Anxiety Difficulty Not difficult at all      Past Medical History:  Diagnosis Date  . Arthritis   . 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 seen on CT.  [name] CAD (coronary artery disease)    a.  Elective R/LHC 06/12/18 showed mild-moderate nonobstructive RCA CAD with mild LAD myocardial bridging but no significant obstructive CAD; there was mildly elevated L heart filling pressure, moderately elevated R heart filling pressure, and mild pulm HTN.  . Cancer ([place])    SKIN  . Chronic knee pain   . Cough   . Edema    FEET/LEGS  . Former tobacco use   . Hemorrhoids   . Hyperbilirubinemia   . Hyperlipidemia   . Mild aortic insufficiency   .  Mild pulmonary hypertension ([place])   . Moderate mitral regurgitation   . NICM (nonischemic cardiomyopathy) ([place])    a. EF 40-45% by echo 05/2017.  [name] Pleural effusion, left 11/2018  . Pneumonia   . PONV (postoperative nausea and vomiting)    after first cataract  . Rosacea   . Ruptured spleen   . Thoracic aortic aneurysm ([place])    a. 4.4cm by CT 12/2017, imaged incompletely at 4.3cm in 10/2018 CT.  [name] Wears dentures    partial upper  . Wheezing    Past Surgical History:  Procedure Laterality Date  . APPENDECTOMY     Dr Emilio Math  . CARDIAC CATHETERIZATION  2005  . CARDIAC CATHETERIZATION  2002  . CATARACT EXTRACTION W/PHACO Right 10/24/2015   Procedure: CATARACT  EXTRACTION PHACO AND INTRAOCULAR LENS PLACEMENT (IOC);  Surgeon: [name], MD;  Location: [facility];  Service: Ophthalmology;  Laterality: Right;  [place] 00:52 AP% 23.5 CDE 12.35 fluid pack lot # [id number]  . CATARACT EXTRACTION W/PHACO Left 11/14/2015   Procedure: CATARACT EXTRACTION PHACO AND INTRAOCULAR LENS PLACEMENT (IOC);  Surgeon: [name], MD;  Location: [facility];  Service: Ophthalmology;  Laterality: Left;  [place] 00:55 AP% 19.1 CDE 10.66 fluid pack lot # [id number]  . COLONOSCOPY  1987  . COLONOSCOPY WITH PROPOFOL N/A 08/22/2017   Procedure: COLONOSCOPY WITH PROPOFOL;  Surgeon: [name], MD;  Location: [place];  Service: Gastroenterology;  Laterality: N/A;  . EYE SURGERY    . HERNIA REPAIR     umbilical/ Dr Emilio Math  . INGUINAL HERNIA REPAIR Right 02/04/2019   Procedure: HERNIA REPAIR INGUINAL WITH MESH;  Surgeon: Olean Ree, MD;  Location: [facility];  Service: General;  Laterality: Right;  . IR ANGIOGRAM VISCERAL SELECTIVE  10/29/2018  . IR EMBO ART  VEN HEMORR LYMPH EXTRAV  INC GUIDE ROADMAPPING  10/29/2018  . IR THORACENTESIS ASP PLEURAL SPACE W/IMG GUIDE  11/23/2018  . IR US GUIDE VASC ACCESS RIGHT  10/29/2018  . KNEE ARTHROSCOPY Bilateral   . POLYPECTOMY  08/22/2017   Procedure: POLYPECTOMY;  Surgeon: [name], MD;  Location: [place];  Service: Gastroenterology;;  . RIGHT/LEFT HEART CATH AND CORONARY ANGIOGRAPHY N/A 06/13/2017   Procedure: Right/Left Heart Cath and Coronary Angiography;  Surgeon: [name], MD;  Location: [place] [facility];  Service: Cardiovascular;  Laterality: N/A;  . TONSILLECTOMY     Social History   Socioeconomic History  . Marital status: Married    Spouse name: Jeanett Schlein  . Number of children: 2  . Years of education: Not on file  . Highest education level: 7th grade  Occupational History  . Occupation: Retired  Tobacco Use  . Smoking status: Former Smoker    Packs/day: 1.50    Years: 20.00    Pack  years: 30.00    Types: Cigarettes    Quit date: 1987    Years since quitting: 35.1  . Smokeless tobacco: Former Systems developer    Types: Chew    Quit date: 2000  . Tobacco comment: smoking cessation materials not required  Vaping Use  . Vaping Use: Never used  Substance and Sexual Activity  . Alcohol use: No    Alcohol/week: 0.0 standard drinks  . Drug use: No  . Sexual activity: Never  Other Topics Concern  . Not on file  Social History Narrative   Pt's wife Tomasa Hosteller in nursing home   Social Determinants of Health   Financial Resource Strain: Low Risk   . Difficulty of  Paying Living Expenses: Not hard at all  Food Insecurity: No Food Insecurity  . Worried About Charity fundraiser in the Last Year: Never true  . Ran Out of Food in the Last Year: Never true  Transportation Needs: No Transportation Needs  . Lack of Transportation (Medical): No  . Lack of Transportation (Non-Medical): No  Physical Activity: Inactive  . Days of Exercise per Week: 0 days  . Minutes of Exercise per Session: 0 min  Stress: No Stress Concern Present  . Feeling of Stress : Only a little  Social Connections: Unknown  . Frequency of Communication with Friends and Family: Patient refused  . Frequency of Social Gatherings with Friends and Family: Patient refused  . Attends Religious Services: Patient refused  . Active Member of Clubs or Organizations: Patient refused  . Attends Archivist Meetings: Patient refused  . Marital Status: Married  Human resources officer Violence: Not At Risk  . Fear of Current or Ex-Partner: No  . Emotionally Abused: No  . Physically Abused: No  . Sexually Abused: No   Family History  Problem Relation Age of Onset  . Alzheimer's disease Mother   . Pulmonary embolism Father   . Diabetes Father   . Heart attack Father 4  . Diabetes Brother   . Heart disease Brother        CABG  . Heart disease Brother        CABG  . Diabetes Brother   . Healthy Brother     Current Outpatient Medications on File Prior to Visit  Medication Sig  . apixaban (ELIQUIS) 2.5 MG TABS tablet Take 1 tablet (2.5 mg total) by mouth 2 (two) times daily.  [name] atorvastatin (LIPITOR) 80 MG tablet Take 1 tablet (80 mg total) by mouth daily.  . B COMPLEX VITAMINS PO Take 1 tablet by mouth daily.   . Carboxymethylcellul-Glycerin (LUBRICATING EYE DROPS OP) Apply 1 drop to eye as needed (dry eyes).   . cholecalciferol (VITAMIN D3) 25 MCG (1000 UNIT) tablet Take 1,000 Units by mouth daily.  . diclofenac sodium (VOLTAREN) 1 % GEL Apply 4 g topically 4 (four) times daily. To the knee if needed  . furosemide (LASIX) 20 MG tablet Take 20 mg by mouth daily as needed.  . Guaifenesin 1200 MG TB12 Take 1,200 mg by mouth at bedtime.   No current facility-administered medications on file prior to visit.    Review of Systems Per HPI unless specifically indicated above      Objective:    BP 121/69   Pulse (!) 54   Ht 6\' 1"  (1.854 m)   Wt 204 lb 12.8 oz (92.9 kg)   SpO2 100%   BMI 27.02 kg/m   Wt Readings from Last 3 Encounters:  02/02/21 204 lb 12.8 oz (92.9 kg)  01/24/21 203 lb (92.1 kg)  01/17/21 203 lb 12.8 oz (92.4 kg)    Physical Exam Vitals and nursing note reviewed.  Constitutional:      General: He is not in acute distress.    Appearance: He is well-developed and well-nourished. He is not diaphoretic.     Comments: Well-appearing, comfortable, cooperative  HENT:     Head: Normocephalic and atraumatic.     Mouth/Throat:     Mouth: Oropharynx is clear and moist.  Eyes:     General:        Right eye: No discharge.        Left eye: No discharge.  Conjunctiva/sclera: Conjunctivae normal.  Neck:     Thyroid: No thyromegaly.  Cardiovascular:     Rate and Rhythm: Normal rate and regular rhythm.     Pulses: Intact distal pulses.     Heart sounds: Normal heart sounds. No murmur heard.     Comments: No ectopy Pulmonary:     Effort: Pulmonary effort is normal.  No respiratory distress.     Breath sounds: Normal breath sounds. No wheezing or rales.  Musculoskeletal:        General: No edema. Normal range of motion.     Cervical back: Normal range of motion and neck supple.     Comments: Bilateral knees with some bulky appearance, some crepitus on motion R>L, intact ROM some stiffness, non tender today, no effusion  Lymphadenopathy:     Cervical: No cervical adenopathy.  Skin:    General: Skin is warm and dry.     Findings: No erythema or rash.  Neurological:     Mental Status: He is alert and oriented to person, place, and time.  Psychiatric:        Mood and Affect: Mood and affect normal.        Behavior: Behavior normal.     Comments: Well groomed, good eye contact, normal speech and thoughts    Results for orders placed or performed during the hospital encounter of 01/18/21  TSH  Result Value Ref Range   TSH 4.246 0.350 - 4.500 uIU/mL  Comprehensive metabolic panel  Result Value Ref Range   Sodium 141 135 - 145 mmol/L   Potassium 4.3 3.5 - 5.1 mmol/L   Chloride 102 98 - 111 mmol/L   CO2 30 22 - 32 mmol/L   Glucose, Bld 122 (H) 70 - 99 mg/dL   BUN 25 (H) 8 - 23 mg/dL   Creatinine, Ser 1.55 (H) 0.61 - 1.24 mg/dL   Calcium 8.9 8.9 - 10.3 mg/dL   Total Protein 6.6 6.5 - 8.1 g/dL   Albumin 3.8 3.5 - 5.0 g/dL   AST 24 15 - 41 U/L   ALT 19 0 - 44 U/L   Alkaline Phosphatase 64 38 - 126 U/L   Total Bilirubin 1.2 0.3 - 1.2 mg/dL   GFR, Estimated 44 (L) >60 mL/min   Anion gap 9 5 - 15      Assessment & Plan:   Problem List Items Addressed This Visit    Thoracic aortic aneurysm without rupture (HCC)   Stage 3 chronic kidney disease (HCC)   Prediabetes   PAF (paroxysmal atrial fibrillation) (HCC) - Primary   Heart failure with reduced ejection fraction ([place])   Gonalgia   Relevant Orders   Ambulatory referral to Orthopedic Surgery   Dyslipidemia   Coronary artery disease involving native coronary artery of native heart without  angina pectoris   Arthritis of knee, degenerative   Relevant Orders   Ambulatory referral to Orthopedic Surgery    Other Visit Diagnoses    Encounter to establish care with new doctor          Review outside records from specialists  #Knee Pain, Bilateral Osteoarthritis Chronic problem Known issue from [place] in past had followed, prior meniscus procedure arthroscopy and injections cortisone. - Last visit ortho 2020 - Encouraged continue topical Diclofenac and use Tylenol Oral OTC PRN - Avoid NSAID on anticoag - Referral back to [facility]  #CAD PAF, HTN HLD / HFrEF Followed by Cardiology Will review records Continue on low dose Eliquis Not  on Amiodarone Follow-up  No orders of the defined types were placed in this encounter.     Follow up plan: Return in about 3 months (around 05/02/2021) for 3 month follow-up HTN, PreDM, Arthritis Knees.  Nobie Putnam, [facility] 02/02/2021, 10:24 AM

## 2021-02-12 ENCOUNTER — Ambulatory Visit: Payer: Self-pay

## 2021-02-12 DIAGNOSIS — I509 Heart failure, unspecified: Secondary | ICD-10-CM

## 2021-02-12 NOTE — Chronic Care Management (AMB) (Signed)
Error. Please disregard

## 2021-02-16 NOTE — Chronic Care Management (AMB) (Signed)
Chronic Care Management   Follow Up Note    Name: Anthony Mosley MRN: [id number] DOB: 04/21/1935  Primary Care Provider: Olin Hauser, DO Reason for referral : Chronic Care Management   Anthony Mosley is a 86 y.o. year old male who is a primary care patient of Olin Hauser, DO. The CCM team was consulted for assistance with chronic disease management and care coordination.   Review of Anthony Mosley status, including review of consultants reports, relevant labs and test results was conducted today. Collaboration with appropriate care team members was performed as part of the comprehensive evaluation and provision of chronic care management services.    SDOH (Social Determinants of Health) assessments performed: No      Outpatient Encounter Medications as of 02/12/2021  Medication Sig Note  . apixaban (ELIQUIS) 2.5 MG TABS tablet Take 1 tablet (2.5 mg total) by mouth 2 (two) times daily.   . B COMPLEX VITAMINS PO Take 1 tablet by mouth daily.    . Carboxymethylcellul-Glycerin (LUBRICATING EYE DROPS OP) Apply 1 drop to eye as needed (dry eyes).    . cholecalciferol (VITAMIN D3) 25 MCG (1000 UNIT) tablet Take 1,000 Units by mouth daily.   . diclofenac sodium (VOLTAREN) 1 % GEL Apply 4 g topically 4 (four) times daily. To the knee if needed   . furosemide (LASIX) 20 MG tablet Take 20 mg by mouth daily as needed.   . Guaifenesin 1200 MG TB12 Take 1,200 mg by mouth at bedtime. 10/04/2019: Takes qhs for congestion   No facility-administered encounter medications on file as of 02/12/2021.     Objective:  Goals Addressed            This Visit's Progress   . COMPLETED: Chronic Disease Management       Current Barriers:  . Chronic Disease Management support, education, and care coordination needs related to CHF and HLD   Clinical Goal(s) related to CHF and HLD:  . Over the next 90 days, patient will monitor blood pressure and pulse daily and record  readings. . Over the next 90 days, patient will weigh daily and maintain a log.  . Over the next 90 days ,patient will take all medications as prescribed. . Over the next 90 days, patient will attend all provider appointments as scheduled.  . Over the next 90 days, patient will adhere to recommended cardiac prudent/heart healthy diet.  . Over the next 90 days, patient will follow recommended safety measures to prevent falls.  Interventions related to CHF and HLD:   . Discussed care management needs. Anthony Mosley reports doing well. Remains compliant with medications and treatment recommendations. Reports weighing as recommended and attempting to adhere to a cardiac prudent/heart healthy diet. Denies s/sx of fluid overload. He denies significant lower extremity edema but continues to experience stiffness and weakness. Denies falls. Reports using recommended safety precautions to prevent accidental falls. He anticipates following up with the Ortho team in March. Denies urgent health concerns or changes in care management needs. Agreed to update his primary care provider is his health status changes and additional outreach is required.    Patient Self Care Activities related to CHF and HLD:  . Patient takes medications as prescribed . Patient attends medical appointments as scheduled . Follows provider's treatment recommendations. . Performs ADL's independently.  Please see past updates related to this goal by clicking on the "Past Updates" button in the selected goal  PLAN Anthony Mosley has met his care management goals. He agreed to update his primary care provider is his health status changes and additional outreach is required. The care management team will gladly assist.     [name] [name] Care Management [phone]

## 2021-02-25 NOTE — Patient Instructions (Signed)
Thank you for allowing the Chronic Care Management team to participate in your care.    Goals Addressed            This Visit's Progress   . COMPLETED: Chronic Disease Management       Current Barriers:  . Chronic Disease Management support, education, and care coordination needs related to CHF and HLD   Clinical Goal(s) related to CHF and HLD:  . Over the next 90 days, patient will monitor blood pressure and pulse daily and record readings. . Over the next 90 days, patient will weigh daily and maintain a log.  . Over the next 90 days ,patient will take all medications as prescribed. . Over the next 90 days, patient will attend all provider appointments as scheduled.  . Over the next 90 days, patient will adhere to recommended cardiac prudent/heart healthy diet.  . Over the next 90 days, patient will follow recommended safety measures to prevent falls.  Interventions related to CHF and HLD:   . Discussed care management needs. Anthony Mosley reports doing well. Remains compliant with medications and treatment recommendations. Reports weighing as recommended and attempting to adhere to a cardiac prudent/heart healthy diet. Denies s/sx of fluid overload. He denies significant lower extremity edema but continues to experience stiffness and weakness. Denies falls. Reports using recommended safety precautions to prevent accidental falls. He anticipates following up with the Ortho team in March. Denies urgent health concerns or changes in care management needs. Agreed to update his primary care provider is his health status changes and additional outreach is required.    Patient Self Care Activities related to CHF and HLD:  . Patient takes medications as prescribed . Patient attends medical appointments as scheduled . Follows provider's treatment recommendations. . Performs ADL's independently.  Please see past updates related to this goal by clicking on the "Past Updates" button in the  selected goal            Anthony Mosley verbalized understanding of the information discussed during the telephonic outreach today. Declined need for mailed/printed instructions. He has met his care management goals. He agreed to update his primary care provider is his health status changes and additional outreach is required. The care management team will gladly assist.     [name] [facility] Care Management [phone]

## 2021-03-01 DIAGNOSIS — M170 Bilateral primary osteoarthritis of knee: Secondary | ICD-10-CM | POA: Diagnosis not present

## 2021-03-04 DIAGNOSIS — R7309 Other abnormal glucose: Secondary | ICD-10-CM | POA: Insufficient documentation

## 2021-03-15 ENCOUNTER — Other Ambulatory Visit: Payer: Self-pay

## 2021-03-15 MED ORDER — FUROSEMIDE 20 MG PO TABS: 20.0000 mg | ORAL_TABLET | Freq: Every day | ORAL | 2 refills | Status: DC | PRN

## 2021-03-15 NOTE — Telephone Encounter (Signed)
This medication has been prescribed to patient several times by providers at [facility] according to previous chart notes and encounters. When Rx was updated, it listed it as a historical medication even though it was not. Prescription sent to pharmacy on file.

## 2021-03-15 NOTE — Telephone Encounter (Signed)
*  STAT* If patient is at the pharmacy, call can be transferred to refill team.   1. Which medications need to be refilled? (please list name of each medication and dose if known) Furosemide  2. Which pharmacy/location (including street and city if local pharmacy) is medication to be sent to? [facility]  3. Do they need a 30 day or 90 day supply? [place]

## 2021-03-15 NOTE — Telephone Encounter (Signed)
Please advise if ok to refill historical provider. 

## 2021-04-11 ENCOUNTER — Other Ambulatory Visit: Payer: Self-pay

## 2021-04-11 MED ORDER — ATORVASTATIN CALCIUM 80 MG PO TABS: 80.0000 mg | ORAL_TABLET | Freq: Every day | ORAL | 0 refills | Status: DC

## 2021-04-19 DIAGNOSIS — M1711 Unilateral primary osteoarthritis, right knee: Secondary | ICD-10-CM | POA: Diagnosis not present

## 2021-04-23 ENCOUNTER — Telehealth: Payer: Self-pay | Admitting: Internal Medicine

## 2021-04-23 NOTE — Telephone Encounter (Signed)
    Anthony Mosley DOB:  [birth year]  MRN:  [id number]   Primary Cardiologist: [name], MD  Chart reviewed as part of pre-operative protocol coverage. Pharmacy clearance only requested. Per pharmacy review and office protocols Mr.  Anthony Mosley may hold Eliquis 3 days prior to the planned procedure.   I will route this recommendation to the requesting party via Epic fax function and remove from pre-op pool.  Please call with questions.  Loel Dubonnet, NP 04/23/2021, 2:58 PM

## 2021-04-23 NOTE — Telephone Encounter (Signed)
   [facility] Pre-operative Risk Assessment    Patient Name: Anthony Mosley  DOB: [birth year]  MRN: [id number]   HEARTCARE STAFF: - Please ensure there is not already an duplicate clearance open for this procedure. - Under Visit Info/Reason for Call, type in Other and utilize the format Clearance MM/DD/YY or Clearance TBD. Do not use dashes or single digits. - If request is for dental extraction, please clarify the # of teeth to be extracted.  Request for surgical clearance:  1. What type of surgery is being performed? R TKA  2. When is this surgery scheduled? TBD  3. What type of clearance is required (medical clearance vs. Pharmacy clearance to hold med vs. Both)? pharm  4. Are there any medications that need to be held prior to surgery and how long? Stop eliquis 3 days prior  5. Practice name and name of physician performing surgery? [facility]  6. What is the office phone number? [phone]   7.   What is the office fax number? [phone]  8.   Anesthesia type (None, local, MAC, general) ? Not noted   Marykay Lex 04/23/2021, 11:03 AM  _________________________________________________________________   (provider comments below)

## 2021-04-23 NOTE — Telephone Encounter (Signed)
Patient with diagnosis of afib on Eliquis for anticoagulation.    Procedure: right TKA Date of procedure: TBD  CHA2DS2-VASc Score = 4  This indicates a 4.8% annual risk of stroke. The patient's score is based upon: CHF History: Yes HTN History: No Diabetes History: No Stroke History: No Vascular Disease History: Yes Age Score: 2 Gender Score: 0  CrCl 28mL/min Platelet count 147K  Per office protocol, patient can hold Eliquis for 3 days prior to procedure.

## 2021-04-24 ENCOUNTER — Ambulatory Visit: Payer: Medicare HMO | Admitting: Dermatology

## 2021-04-24 ENCOUNTER — Encounter: Payer: Self-pay | Admitting: Dermatology

## 2021-04-24 ENCOUNTER — Other Ambulatory Visit: Payer: Self-pay

## 2021-04-24 DIAGNOSIS — L814 Other melanin hyperpigmentation: Secondary | ICD-10-CM

## 2021-04-24 DIAGNOSIS — D692 Other nonthrombocytopenic purpura: Secondary | ICD-10-CM | POA: Diagnosis not present

## 2021-04-24 DIAGNOSIS — Z85828 Personal history of other malignant neoplasm of skin: Secondary | ICD-10-CM

## 2021-04-24 DIAGNOSIS — Z872 Personal history of diseases of the skin and subcutaneous tissue: Secondary | ICD-10-CM

## 2021-04-24 DIAGNOSIS — Z1283 Encounter for screening for malignant neoplasm of skin: Secondary | ICD-10-CM | POA: Diagnosis not present

## 2021-04-24 DIAGNOSIS — L570 Actinic keratosis: Secondary | ICD-10-CM | POA: Diagnosis not present

## 2021-04-24 DIAGNOSIS — L820 Inflamed seborrheic keratosis: Secondary | ICD-10-CM

## 2021-04-24 DIAGNOSIS — L821 Other seborrheic keratosis: Secondary | ICD-10-CM

## 2021-04-24 DIAGNOSIS — L578 Other skin changes due to chronic exposure to nonionizing radiation: Secondary | ICD-10-CM | POA: Diagnosis not present

## 2021-04-24 DIAGNOSIS — D1800 Hemangioma unspecified site: Secondary | ICD-10-CM

## 2021-04-24 DIAGNOSIS — D229 Melanocytic nevi, unspecified: Secondary | ICD-10-CM

## 2021-04-24 NOTE — Progress Notes (Signed)
Follow-Up Visit   Subjective  Anthony Mosley is a 86 y.o. male who presents for the following: UBSE (Patient here for UBSE. He has a scaly spot on his right cheek, present for months, itchy at times. Patient's son noticed a few "moles" on his back he would like checked. 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 Hx of Aks.).   The following portions of the chart were reviewed this encounter and updated as appropriate:       Review of Systems:  No other skin or systemic complaints except as noted in HPI or Assessment and Plan.  Objective  Well appearing patient in no apparent distress; mood and affect are within normal limits.  All skin waist up examined.  Objective  Right Medial Cheek: Erythematous keratotic or waxy stuck-on papule  Objective  R cheek x 7, L cheek x 7, inferior vertex x 1 (15): Pink scaly macules.   Assessment & Plan   Skin cancer screening performed today.   Actinic Damage - chronic, secondary to cumulative UV radiation exposure/sun exposure over time - diffuse scaly erythematous macules with underlying dyspigmentation - Recommend daily broad spectrum sunscreen SPF 30+ to sun-exposed areas, reapply every 2 hours as needed.  - Recommend staying in the shade or wearing long sleeves, sun glasses (UVA+UVB protection) and wide brim hats (4-inch brim around the entire circumference of the hat). - Call for new or changing lesions.  Lentigines - Scattered tan macules - Due to sun exposure - Benign-appering, observe - Recommend daily broad spectrum sunscreen SPF 30+ to sun-exposed areas, reapply every 2 hours as needed. - Call for any changes  Seborrheic Keratoses - Stuck-on, waxy, tan-brown papules and/or plaques  - Benign-appearing - Discussed benign etiology and prognosis. - Observe - Call for any changes  Melanocytic Nevi - Tan-brown and/or pink-flesh-colored symmetric macules and papules - Benign appearing on exam today -  Observation - Call clinic for new or changing moles - Recommend daily use of broad spectrum spf 30+ sunscreen to sun-exposed areas.   Hemangiomas - Red papules - Discussed benign nature - Observe - Call for any changes  Purpura - Chronic; persistent and recurrent.  Treatable, but not curable. - Violaceous macules and patches - Benign - Related to trauma, age, sun damage and/or use of blood thinners, chronic use of topical and/or oral steroids - Observe - Can use OTC arnica containing moisturizer such as Dermend Bruise Formula if desired - Call for worsening or other concerns  History of Squamous Cell Carcinoma of the Skin - No evidence of recurrence today - No lymphadenopathy - Recommend regular full body skin exams - Recommend daily broad spectrum sunscreen SPF 30+ to sun-exposed areas, reapply every 2 hours as needed.  - Call if any new or changing lesions are noted between office visits     Inflamed seborrheic keratosis Right Medial Cheek  Prior to procedure, discussed risks of blister formation, small wound, skin dyspigmentation, or rare scar following cryotherapy.   Recheck on f/u.  Destruction of lesion - Right Medial Cheek  Destruction method: cryotherapy   Informed consent: discussed and consent obtained   Lesion destroyed using liquid nitrogen: Yes   Region frozen until ice ball extended beyond lesion: Yes   Outcome: patient tolerated procedure well with no complications   Post-procedure details: wound care instructions given    AK (actinic keratosis) (15) R cheek x 7, L cheek x 7, inferior vertex x 1  Prior to procedure, discussed  risks of blister formation, small wound, skin dyspigmentation, or rare scar following cryotherapy.    Destruction of lesion - R cheek x 7, L cheek x 7, inferior vertex x 1  Destruction method: cryotherapy   Informed consent: discussed and consent obtained   Lesion destroyed using liquid nitrogen: Yes   Region frozen until ice  ball extended beyond lesion: Yes   Outcome: patient tolerated procedure well with no complications   Post-procedure details: wound care instructions given    Return in about 2 months (around 06/24/2021) for ISK R med cheek recheck, AKs.   IJamesetta Orleans, CMA, am acting as scribe for Brendolyn Patty, MD .  Documentation: I have reviewed the above documentation for accuracy and completeness, and I agree with the above.  Brendolyn Patty MD

## 2021-04-24 NOTE — Patient Instructions (Addendum)
Cryotherapy Aftercare  Wash gently with soap and water everyday.   Apply Vaseline and Band-Aid daily until healed.   Seborrheic Keratosis  What causes seborrheic keratoses? Seborrheic keratoses are harmless, common skin growths that first appear during adult life.  As time goes by, more growths appear.  Some people may develop a large number of them.  Seborrheic keratoses appear on both covered and uncovered body parts.  They are not caused by sunlight.  The tendency to develop seborrheic keratoses can be inherited.  They vary in color from skin-colored to gray, brown, or even black.  They can be either smooth or have a rough, warty surface.   Seborrheic keratoses are superficial and look as if they were stuck on the skin.  Under the microscope this type of keratosis looks like layers upon layers of skin.  That is why at times the top layer may seem to fall off, but the rest of the growth remains and re-grows.    Treatment Seborrheic keratoses do not need to be treated, but can easily be removed in the office.  Seborrheic keratoses often cause symptoms when they rub on clothing or jewelry.  Lesions can be in the way of shaving.  If they become inflamed, they can cause itching, soreness, or burning.  Removal of a seborrheic keratosis can be accomplished by freezing, burning, or surgery. If any spot bleeds, scabs, or grows rapidly, please return to have it checked, as these can be an indication of a skin cancer.   If you have any questions or concerns for your doctor, please call our main line at [phone] and press option 4 to reach your doctor's medical assistant. If no one answers, please leave a voicemail as directed and we will return your call as soon as possible. Messages left after 4 pm will be answered the following business day.   You may also send us a message via MyChart. We typically respond to MyChart messages within 1-2 business days.  For prescription refills, please ask your  pharmacy to contact our office. Our fax number is [phone].  If you have an urgent issue when the clinic is closed that cannot wait until the next business day, you can page your doctor at the number below.    Please note that while we do our best to be available for urgent issues outside of office hours, we are not available 24/7.   If you have an urgent issue and are unable to reach us, you may choose to seek medical care at your doctor's office, retail clinic, urgent care center, or emergency room.  If you have a medical emergency, please immediately call 911 or go to the emergency department.  Pager Numbers  - Dr. Kowalski: [phone]  - Dr. Moye: [phone]  - Dr. Stewart: [phone]  In the event of inclement weather, please call our main line at [phone] for an update on the status of any delays or closures.  Dermatology Medication Tips: Please keep the boxes that topical medications come in in order to help keep track of the instructions about where and how to use these. Pharmacies typically print the medication instructions only on the boxes and not directly on the medication tubes.   If your medication is too expensive, please contact our office at [phone] option 4 or send us a message through MyChart.   We are unable to tell what your co-pay for medications will be in advance as this is different depending on your insurance coverage.   However, we may be able to find a substitute medication at lower cost or fill out paperwork to get insurance to cover a needed medication.   If a prior authorization is required to get your medication covered by your insurance company, please allow us 1-2 business days to complete this process.  Drug prices often vary depending on where the prescription is filled and some pharmacies may offer cheaper prices.  The website www.goodrx.com contains coupons for medications through different pharmacies. The prices here do not  account for what the cost may be with help from insurance (it may be cheaper with your insurance), but the website can give you the price if you did not use any insurance.  - You can print the associated coupon and take it with your prescription to the pharmacy.  - You may also stop by our office during regular business hours and pick up a GoodRx coupon card.  - If you need your prescription sent electronically to a different pharmacy, notify our office through [facility] MyChart or by phone at [phone] option 4.  

## 2021-05-17 ENCOUNTER — Other Ambulatory Visit: Payer: Self-pay

## 2021-05-17 ENCOUNTER — Ambulatory Visit (INDEPENDENT_AMBULATORY_CARE_PROVIDER_SITE_OTHER): Payer: Medicare HMO | Admitting: Family Medicine

## 2021-05-17 ENCOUNTER — Encounter: Payer: Self-pay | Admitting: Family Medicine

## 2021-05-17 VITALS — BP 109/67 | HR 60 | Ht 73.0 in | Wt 205.8 lb

## 2021-05-17 DIAGNOSIS — R7303 Prediabetes: Secondary | ICD-10-CM | POA: Diagnosis not present

## 2021-05-17 DIAGNOSIS — M170 Bilateral primary osteoarthritis of knee: Secondary | ICD-10-CM

## 2021-05-17 DIAGNOSIS — F330 Major depressive disorder, recurrent, mild: Secondary | ICD-10-CM | POA: Diagnosis not present

## 2021-05-17 LAB — POCT GLYCOSYLATED HEMOGLOBIN (HGB A1C): Hemoglobin A1C: 5.6 % — AB (ref 4.0–5.6)

## 2021-05-17 NOTE — Patient Instructions (Addendum)
Thank you for coming to the office today.    Please schedule a Follow-up Appointment to: Return in about 6 months (around 11/16/2021) for 6 month follow-up CKD PreDM, Knee replacement.  If you have any other questions or concerns, please feel free to call the office or send a message through [facility]. You may also schedule an earlier appointment if necessary.  Additionally, you may be receiving a survey about your experience at our office within a few days to 1 week by e-mail or mail. We value your feedback.  Nobie Putnam, DO [place]

## 2021-05-17 NOTE — Progress Notes (Signed)
Subjective:    Patient ID: Anthony Mosley, male    DOB: July 13, 1935, 86 y.o.   MRN: [id number]  Anthony Mosley is a 86 y.o. male presenting on 05/17/2021 for Hypertension, Prediabetes, and Knee Pain   HPI    Cardiology CAD Heart Failure w Reduced Ejection Fracture Paroxysmal Atrial Fibrillation Thoracic Aortic Aneurysm  Followed by Cardiology Dr Saunders Revel and Marrianne Mood PA On Apixban Eliquis 2.5mg  daily based on age and creatinine  Chronic Knee Pain, bilateral lower ext Episodic lower ext pains. If he is very active outdoors, he can use a topical salve / Diclofenac (Voltaren) to help. He does have some issue with pain in knee with change of position. Can get a sharp pain at times if moves knee sideways. If he is walking for longer period of time he can feel symptoms. He has used a knee sleeve at times. He does not take any NSAIDs. Followed by [facility] - Dr Marry Guan, seen multiple times and has upcoming surgery planned R TKR 09/2021  CKD-III Stable, previous issue.  Pre-Diabetes A1c in past 5.4 to 5.7, last 5.6 12/2019 He checks CBG occasional  Major depression recurrent, mild Currently not on therapy Mood is stable, see below    Depression screen [facility] 2/9 05/17/2021 02/02/2021 09/07/2020  Decreased Interest 0 0 0  Down, Depressed, Hopeless 0 0 1  PHQ - 2 Score 0 0 1  Altered sleeping 0 3 -  Tired, decreased energy 0 2 -  Change in appetite 0 0 -  Feeling bad or failure about yourself  0 0 -  Trouble concentrating 0 0 -  Moving slowly or fidgety/restless 0 1 -  Suicidal thoughts 0 0 -  PHQ-9 Score 0 6 -  Difficult doing work/chores Not difficult at all Not difficult at all -  Some recent data might be hidden    Social History   Tobacco Use  . Smoking status: Former Smoker    Packs/day: 1.50    Years: 20.00    Pack years: 30.00    Types: Cigarettes    Quit date: 1987    Years since quitting: 35.4  . Smokeless tobacco: Former Systems developer     Types: Chew    Quit date: 2000  . Tobacco comment: smoking cessation materials not required  Vaping Use  . Vaping Use: Never used  Substance Use Topics  . Alcohol use: No    Alcohol/week: 0.0 standard drinks  . Drug use: No    Review of Systems Per HPI unless specifically indicated above     Objective:    BP 109/67   Pulse 60   Ht 6\' 1"  (1.854 m)   Wt 205 lb 12.8 oz (93.4 kg)   SpO2 99%   BMI 27.15 kg/m   Wt Readings from Last 3 Encounters:  05/17/21 205 lb 12.8 oz (93.4 kg)  02/02/21 204 lb 12.8 oz (92.9 kg)  01/24/21 203 lb (92.1 kg)    Physical Exam Vitals and nursing note reviewed.  Constitutional:      General: He is not in acute distress.    Appearance: He is well-developed. He is not diaphoretic.     Comments: Well-appearing, comfortable, cooperative  HENT:     Head: Normocephalic and atraumatic.  Eyes:     General:        Right eye: No discharge.        Left eye: No discharge.     Conjunctiva/sclera: Conjunctivae normal.  Neck:  Thyroid: No thyromegaly.  Cardiovascular:     Rate and Rhythm: Normal rate and regular rhythm.     Heart sounds: Normal heart sounds. No murmur heard.   Pulmonary:     Effort: Pulmonary effort is normal. No respiratory distress.     Breath sounds: Normal breath sounds. No wheezing or rales.  Musculoskeletal:        General: Normal range of motion.     Cervical back: Normal range of motion and neck supple.  Lymphadenopathy:     Cervical: No cervical adenopathy.  Skin:    General: Skin is warm and dry.     Findings: No erythema or rash.  Neurological:     Mental Status: He is alert and oriented to person, place, and time.  Psychiatric:        Behavior: Behavior normal.     Comments: Well groomed, good eye contact, normal speech and thoughts       Results for orders placed or performed in visit on 05/17/21  POCT HgB A1C  Result Value Ref Range   Hemoglobin A1C 5.6 (A) 4.0 - 5.6 %      Assessment & Plan:    Problem List Items Addressed This Visit    Prediabetes - Primary   Relevant Orders   POCT HgB A1C (Completed)   Mild recurrent major depression (HCC)   Arthritis of knee, degenerative      PreDM A1c 5.6, improved and controlled Reassurance, continue healthy lifestyle  Major depression, recurrent mild Mood improved and controlled. Doing well not on medication  Arthritis, DJD Chronic R knee pain Upcoming TKR as planned later this year.  No orders of the defined types were placed in this encounter.     Follow up plan: Return in about 6 months (around 11/16/2021) for 6 month follow-up CKD PreDM, Knee replacement.    Nobie Putnam, [facility] 05/17/2021, 8:57 AM

## 2021-06-26 ENCOUNTER — Other Ambulatory Visit: Payer: Self-pay

## 2021-06-26 ENCOUNTER — Ambulatory Visit: Payer: Medicare HMO | Admitting: Dermatology

## 2021-06-26 DIAGNOSIS — D692 Other nonthrombocytopenic purpura: Secondary | ICD-10-CM

## 2021-06-26 DIAGNOSIS — L821 Other seborrheic keratosis: Secondary | ICD-10-CM | POA: Diagnosis not present

## 2021-06-26 DIAGNOSIS — L578 Other skin changes due to chronic exposure to nonionizing radiation: Secondary | ICD-10-CM

## 2021-06-26 DIAGNOSIS — L739 Follicular disorder, unspecified: Secondary | ICD-10-CM

## 2021-06-26 DIAGNOSIS — L570 Actinic keratosis: Secondary | ICD-10-CM | POA: Diagnosis not present

## 2021-06-26 NOTE — Patient Instructions (Signed)

## 2021-06-26 NOTE — Progress Notes (Signed)
Follow-Up Visit   Subjective  Anthony Mosley is a 86 y.o. male who presents for the following: Actinic Keratosis (Follow up - bilateral cheeks and inf vertex treated with LN2), Follow-up (ISK follow up of right cheek treated with LN2), and Other (New spot of left cheek that he just noticed yesterday).    The following portions of the chart were reviewed this encounter and updated as appropriate:        Review of Systems:  No other skin or systemic complaints except as noted in HPI or Assessment and Plan.  Objective  Well appearing patient in no apparent distress; mood and affect are within normal limits.  A focused examination was performed including scalp, face, arms. Relevant physical exam findings are noted in the Assessment and Plan.  Left mid cheek Pink firm papule, slight scale, just came up  Vertex x 1, left temple x 1, right nasal tip x 2, central nasal tip x 1, left lower cheek x 2 (7) Erythematous thin papules/macules with gritty scale.    Assessment & Plan   Actinic Damage - chronic, secondary to cumulative UV radiation exposure/sun exposure over time - diffuse scaly erythematous macules with underlying dyspigmentation - Recommend daily broad spectrum sunscreen SPF 30+ to sun-exposed areas, reapply every 2 hours as needed.  - Recommend staying in the shade or wearing long sleeves, sun glasses (UVA+UVB protection) and wide brim hats (4-inch brim around the entire circumference of the hat). - Call for new or changing lesions.  Seborrheic Keratoses - Stuck-on, waxy, tan-brown papules R temple, ISK R cheek is clear (treated last visit) - Benign-appearing - Discussed benign etiology and prognosis. - Observe - Call for any changes   Purpura - Chronic; persistent and recurrent.  Treatable, but not curable. - Violaceous macules and patches - Benign - Related to trauma, age, sun damage and/or use of blood thinners, chronic use of topical and/or oral steroids -  Observe - Can use OTC arnica containing moisturizer such as Dermend Bruise Formula if desired - Call for worsening or other concerns  Folliculitis Left mid cheek  Benign appearing. Recommend 1% hydrocortisone cream qd/bid. RTC if not resolving.  AK (actinic keratosis) (7) Vertex x 1, left temple x 1, right nasal tip x 2, central nasal tip x 1, left lower cheek x 2  Actinic keratoses are precancerous spots that appear secondary to cumulative UV radiation exposure/sun exposure over time. They are chronic with expected duration over 1 year. A portion of actinic keratoses will progress to squamous cell carcinoma of the skin. It is not possible to reliably predict which spots will progress to skin cancer and so treatment is recommended to prevent development of skin cancer.  Recommend daily broad spectrum sunscreen SPF 30+ to sun-exposed areas, reapply every 2 hours as needed.  Recommend staying in the shade or wearing long sleeves, sun glasses (UVA+UVB protection) and wide brim hats (4-inch brim around the entire circumference of the hat). Call for new or changing lesions.   Destruction of lesion - Vertex x 1, left temple x 1, right nasal tip x 2, central nasal tip x 1, left lower cheek x 2  Destruction method: cryotherapy   Informed consent: discussed and consent obtained   Lesion destroyed using liquid nitrogen: Yes   Region frozen until ice ball extended beyond lesion: Yes   Outcome: patient tolerated procedure well with no complications   Post-procedure details: wound care instructions given   Additional details:  Prior to procedure, discussed  risks of blister formation, small wound, skin dyspigmentation, or rare scar following cryotherapy. Recommend Vaseline ointment to treated areas while healing.   Return in about 6 months (around 12/27/2021) for AK follow up.  I, Ashok Cordia, CMA, am acting as scribe for Brendolyn Patty, MD .  Documentation: I have reviewed the above documentation for  accuracy and completeness, and I agree with the above.  Brendolyn Patty MD

## 2021-07-18 ENCOUNTER — Telehealth: Payer: Self-pay | Admitting: *Deleted

## 2021-07-18 ENCOUNTER — Ambulatory Visit: Payer: Medicare HMO | Admitting: Internal Medicine

## 2021-07-18 ENCOUNTER — Other Ambulatory Visit: Payer: Self-pay

## 2021-07-18 ENCOUNTER — Encounter: Payer: Self-pay | Admitting: Internal Medicine

## 2021-07-18 VITALS — BP 110/70 | HR 53 | Ht 73.0 in | Wt 203.0 lb

## 2021-07-18 DIAGNOSIS — I712 Thoracic aortic aneurysm, without rupture, unspecified: Secondary | ICD-10-CM

## 2021-07-18 DIAGNOSIS — I2510 Atherosclerotic heart disease of native coronary artery without angina pectoris: Secondary | ICD-10-CM | POA: Diagnosis not present

## 2021-07-18 DIAGNOSIS — I480 Paroxysmal atrial fibrillation: Secondary | ICD-10-CM

## 2021-07-18 DIAGNOSIS — I491 Atrial premature depolarization: Secondary | ICD-10-CM

## 2021-07-18 DIAGNOSIS — Z79899 Other long term (current) drug therapy: Secondary | ICD-10-CM

## 2021-07-18 DIAGNOSIS — I5022 Chronic systolic (congestive) heart failure: Secondary | ICD-10-CM | POA: Diagnosis not present

## 2021-07-18 NOTE — Telephone Encounter (Signed)
Called pt to notify that his chest CTA has been scheduled at [facility] Friday 07/27/21 at 11:00 AM.  Notified to arrive 15 min prior. Pt voiced understanding and is aware of location.

## 2021-07-18 NOTE — Progress Notes (Signed)
Follow-up Outpatient Visit Date: 07/18/2021  Primary Care Provider: Olin Hauser, DO [address] [id number]  Chief Complaint: Follow-up CAD and heart failure as well as arrhythmias  HPI:  Anthony Mosley is a 86 y.o. male with history of coronary artery disease, chronic HFrEF with partially recovered LVEF, MAT versus PAF, pulmonary hypertension, thoracic aortic aneurysm, mild to moderate mitral regurgitation, hyperlipidemia, and chronic kidney disease stage III, who presents for follow-up of atrial arrhythmias, chronic HFrEF, and coronary artery disease.  I last saw him in February, at which time Anthony Mosley was feeling relatively well.  We agreed to defer amiodarone in the setting of frequent PACs but absence of atrial fibrillation on EKG and concern for medication side effects.  He was continued on apixaban 2.5 mg twice daily based on age and creatinine.  Today, Anthony Mosley reports he has been mostly bothered by pain in his legs, particularly at below the knees.  He notices this most when he first gets up in the morning and actually has some improvement with activity.  He is planning for a right knee replacement in October.  Pain has been present for over a year, with vascular surgery evaluation by Dr. Lucky Cowboy last year being unremarkable.  From a heart standpoint, Anthony Mosley is stable with minimal exertional dyspnea that has been present for at least a year.  He denies chest pain and palpitations.  He has mild chronic ankle edema as well as occasional brief orthostatic lightheadedness.  He is using prn furosemide most days, holding it only when he has to go out.  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 seen on CT.   CAD (coronary artery disease)    a.  Elective R/LHC 06/12/18 showed mild-moderate nonobstructive RCA CAD with mild LAD  myocardial bridging but no significant obstructive CAD; there was mildly elevated L heart filling pressure, moderately elevated R heart filling pressure, and mild pulm HTN.   Cancer [facility])    SKIN - Left wrist - tx by Dr Koleen Nimrod in the past.   Chronic knee pain    Cough    Edema    FEET/LEGS   Former tobacco use    Hemorrhoids    Hyperbilirubinemia    Hyperlipidemia    Mild aortic insufficiency    Mild pulmonary hypertension (HCC)    Moderate mitral regurgitation    NICM (nonischemic cardiomyopathy) ([place])    a. EF 40-45% by echo 05/2017, improved to 45-50% by echo 10/2020.   Pleural effusion, left 11/2018   Pneumonia    PONV (postoperative nausea and vomiting)    after first cataract   Rosacea    Ruptured spleen    Squamous cell carcinoma of skin 11/27/2018   L inf mandible    Thoracic aortic aneurysm (HCC)    a. 4.4cm by CT 12/2017, imaged incompletely at 4.3cm in 10/2018 CT.   Wears dentures    partial upper   Wheezing    Past Surgical History:  Procedure Laterality Date   APPENDECTOMY     Dr Emilio Math   CARDIAC CATHETERIZATION  2005   CARDIAC CATHETERIZATION  2002   CATARACT EXTRACTION W/PHACO Right 10/24/2015   Procedure: CATARACT EXTRACTION PHACO AND INTRAOCULAR LENS PLACEMENT (IOC);  Surgeon: [name], MD;  Location: [facility];  Service: Ophthalmology;  Laterality: Right;  [place] 00:52 AP% 23.5 CDE 12.35 fluid pack lot # [id number]  CATARACT EXTRACTION W/PHACO Left 11/14/2015   Procedure: CATARACT EXTRACTION PHACO AND INTRAOCULAR LENS PLACEMENT (IOC);  Surgeon: [name], MD;  Location: [facility];  Service: Ophthalmology;  Laterality: Left;  [place] 00:55 AP% 19.1 CDE 10.66 fluid pack lot # [id number]   COLONOSCOPY  1987   COLONOSCOPY WITH PROPOFOL N/A 08/22/2017   Procedure: COLONOSCOPY WITH PROPOFOL;  Surgeon: [name], MD;  Location: [place];  Service: Gastroenterology;  Laterality: N/A;   EYE SURGERY     HERNIA REPAIR     umbilical/ Dr Archie Patten HERNIA REPAIR Right 02/04/2019   Procedure: HERNIA REPAIR INGUINAL WITH MESH;  Surgeon: Olean Ree, MD;  Location: [facility];  Service: General;  Laterality: Right;   IR ANGIOGRAM VISCERAL SELECTIVE  10/29/2018   IR EMBO ART  VEN HEMORR LYMPH EXTRAV  INC GUIDE ROADMAPPING  10/29/2018   IR THORACENTESIS ASP PLEURAL SPACE W/IMG GUIDE  11/23/2018   IR US GUIDE VASC ACCESS RIGHT  10/29/2018   KNEE ARTHROSCOPY Bilateral    POLYPECTOMY  08/22/2017   Procedure: POLYPECTOMY;  Surgeon: [name], MD;  Location: [place];  Service: Gastroenterology;;   RIGHT/LEFT HEART CATH AND CORONARY ANGIOGRAPHY N/A 06/13/2017   Procedure: Right/Left Heart Cath and Coronary Angiography;  Surgeon: [name], MD;  Location: [place] [facility];  Service: Cardiovascular;  Laterality: N/A;   TONSILLECTOMY     Past medical and surgical history were reviewed and updated in EPIC.  Current Meds  Medication Sig   apixaban (ELIQUIS) 2.5 MG TABS tablet Take 1 tablet (2.5 mg total) by mouth 2 (two) times daily.   atorvastatin (LIPITOR) 80 MG tablet Take 1 tablet (80 mg total) by mouth daily.   B COMPLEX VITAMINS PO Take 1 tablet by mouth daily.    Carboxymethylcellul-Glycerin (LUBRICATING EYE DROPS OP) Apply 1 drop to eye as needed (dry eyes).    cholecalciferol (VITAMIN D3) 25 MCG (1000 UNIT) tablet Take 1,000 Units by mouth daily.   diclofenac sodium (VOLTAREN) 1 % GEL Apply 4 g topically 4 (four) times daily. To the knee if needed   fluticasone (FLONASE) 50 MCG/ACT nasal spray Place into the nose.   furosemide (LASIX) 20 MG tablet Take 1 tablet (20 mg total) by mouth daily as needed.   Guaifenesin 1200 MG TB12 Take 1,200 mg by mouth at bedtime.   POTASSIUM CHLORIDE PO Take by mouth daily.   vitamin B-12 (CYANOCOBALAMIN) 250 MCG tablet Take 250 mcg by mouth daily.    Allergies: Macrolides and ketolides, Norvasc [amlodipine], Avodart [dutasteride], and Tape  Social History   Tobacco Use    Smoking status: Former    Packs/day: 1.50    Years: 20.00    Pack years: 30.00    Types: Cigarettes    Quit date: 76    Years since quitting: 35.6   Smokeless tobacco: Former    Types: Chew    Quit date: 2000   Tobacco comments:    smoking cessation materials not required  Scientific laboratory technician Use: Never used  Substance Use Topics   Alcohol use: No    Alcohol/week: 0.0 standard drinks   Drug use: No    Family History  Problem Relation Age of Onset   Alzheimer's disease Mother    Pulmonary embolism Father    Diabetes Father    Heart attack Father 51   Diabetes Brother    Heart disease Brother        CABG   Heart  disease Brother        CABG   Diabetes Brother    Healthy Brother     Review of Systems: A 12-system review of systems was performed and was negative except as noted in the HPI.  --------------------------------------------------------------------------------------------------  Physical Exam: BP 110/70 (BP Location: Left Arm, Patient Position: Sitting, Cuff Size: Large)   Pulse (!) 53   Ht '6\' 1"'$  (1.854 m)   Wt 203 lb (92.1 kg)   SpO2 97%   BMI 26.78 kg/m   General:  NAD. Neck: No JVD or HJR. Lungs: Clear to auscultation bilaterally without wheezes or crackles. Heart: Bradycardic but regular without murmurs, rubs, or gallops. Abdomen: Soft, nontender, nondistended. Extremities: Trace pretibial edema bilaterally.  EKG: Normal sinus rhythm with left axis deviation and right bundle branch block.  PACs are no longer present.  RBBB has also replaced IVCD compared to 01/24/2021.  Lab Results  Component Value Date   WBC 5.4 10/06/2020   HGB 14.1 10/06/2020   HCT 41.0 10/06/2020   MCV 91 10/06/2020   PLT 147 (L) 10/06/2020    Lab Results  Component Value Date   NA 141 01/18/2021   K 4.3 01/18/2021   CL 102 01/18/2021   CO2 30 01/18/2021   BUN 25 (H) 01/18/2021   CREATININE 1.55 (H) 01/18/2021   GLUCOSE 122 (H) 01/18/2021   ALT 19 01/18/2021     Lab Results  Component Value Date   CHOL 137 09/06/2020   HDL 49 09/06/2020   LDLCALC 78 09/06/2020   TRIG 40 09/06/2020   CHOLHDL 2.8 09/06/2020    --------------------------------------------------------------------------------------------------  ASSESSMENT AND PLAN: Chronic HFrEF with recovered ejection fraction: Other than mild pretibial edema, Mr. Dural appears euvolemic on examination today.  Weight has been stable.  He has stable NYHA class II symptoms.  We will continue with as needed furosemide defer Rie challenging with ACE inhibitor/ARB given borderline renal insufficiency and soft blood pressure.  Resting bradycardia also precludes beta-blocker use.  Paroxysmal atrial fibrillation and frequent PACs: Rare palpitations reported by Mr. Stubbs.  EKG today shows sinus bradycardia.  We again addressed role for antiarrhythmic therapy but have agreed to defer this.  Continue apixaban 2.5 mg twice daily based on age and creatinine.  We will repeat a BMP today.  Thoracic aortic aneurysm: No symptoms reported.  We will schedule annual CTA to reassess a sending aortic aneurysm at Mr. Groesbeck convenience.  Recheck BMP today.  If there has been significant worsening of his renal function, alternative testing modality will need to be considered.  Coronary artery disease: No angina reported.  Continue atorvastatin for secondary prevention.  Patient is not on aspirin given long-term apixaban use.  Follow-up: Return to clinic in 6 months.  [name], MD 07/18/2021 8:36 AM

## 2021-07-18 NOTE — Patient Instructions (Addendum)
Medication Instructions:  Please continue your current medications, no changes at this time.  *If you need a refill on your cardiac medications before your next appointment, please call your pharmacy*   Lab Work: CBC & BMP LABS WILL APPEAR ON MYCHART, ABNORMAL RESULTS WILL BE CALLED  Testing/Procedures:  CTA of Chest - we will contact you to schedule   Follow-Up: At [facility], you and your health needs are our priority.  As part of our continuing mission to provide you with exceptional heart care, we have created designated Provider Care Teams.  These Care Teams include your primary Cardiologist (physician) and Advanced Practice Providers (APPs -  Physician Assistants and Nurse Practitioners) who all work together to provide you with the care you need, when you need it.  We recommend signing up for the patient portal called "MyChart".  Sign up information is provided on this After Visit Summary.  MyChart is used to connect with patients for Virtual Visits (Telemedicine).  Patients are able to view lab/test results, encounter notes, upcoming appointments, etc.  Non-urgent messages can be sent to your provider as well.   To learn more about what you can do with MyChart, go to [email or web address].    Your next appointment:   6 month(s)  The format for your next appointment:   In Person  Provider:   You may see [name], MD or one of the following Advanced Practice Providers on your designated Care Team:   Murray Hodgkins, NP Christell Faith, PA-C Marrianne Mood, PA-C Cadence Pace, [place]

## 2021-07-19 LAB — BASIC METABOLIC PANEL
BUN/Creatinine Ratio: 18 (ref 10–24)
BUN: 24 mg/dL (ref 8–27)
CO2: 23 mmol/L (ref 20–29)
Calcium: 9 mg/dL (ref 8.6–10.2)
Chloride: 103 mmol/L (ref 96–106)
Creatinine, Ser: 1.34 mg/dL — ABNORMAL HIGH (ref 0.76–1.27)
Glucose: 104 mg/dL — ABNORMAL HIGH (ref 65–99)
Potassium: 4.1 mmol/L (ref 3.5–5.2)
Sodium: 140 mmol/L (ref 134–144)
eGFR: 53 mL/min/{1.73_m2} — ABNORMAL LOW (ref >59)

## 2021-07-19 LAB — CBC
Hematocrit: 39.8 % (ref 37.5–51.0)
Hemoglobin: 13.4 g/dL (ref 13.0–17.7)
MCH: 30.2 pg (ref 26.6–33.0)
MCHC: 33.7 g/dL (ref 31.5–35.7)
MCV: 90 fL (ref 79–97)
Platelets: 174 10*3/uL (ref 150–450)
RBC: 4.44 x10E6/uL (ref 4.14–5.80)
RDW: 12.6 % (ref 11.6–15.4)
WBC: 5.6 10*3/uL (ref 3.4–10.8)

## 2021-07-19 NOTE — Telephone Encounter (Signed)
Attempted to call pt to review instructions prior to chest CTA: liquids only 4 hours prior.   No answer. Lmtcb.

## 2021-07-19 NOTE — Telephone Encounter (Signed)
Spoke to pt. Confirmed instructions below. Pt voiced understanding and has no further questions.

## 2021-07-20 ENCOUNTER — Other Ambulatory Visit: Payer: Self-pay

## 2021-07-20 MED ORDER — ATORVASTATIN CALCIUM 80 MG PO TABS: 80.0000 mg | ORAL_TABLET | Freq: Every day | ORAL | 0 refills | Status: DC

## 2021-07-27 ENCOUNTER — Ambulatory Visit
Admission: RE | Admit: 2021-07-27 | Discharge: 2021-07-27 | Disposition: A | Discharge status: Home / Self Care | Payer: Medicare HMO | Source: Ambulatory Visit | Attending: Internal Medicine | Admitting: Internal Medicine

## 2021-07-27 ENCOUNTER — Ambulatory Visit: Admission: RE | Admit: 2021-07-27 | Payer: Medicare HMO | Source: Ambulatory Visit

## 2021-07-27 ENCOUNTER — Other Ambulatory Visit: Payer: Self-pay

## 2021-07-27 DIAGNOSIS — I77810 Thoracic aortic ectasia: Secondary | ICD-10-CM | POA: Insufficient documentation

## 2021-07-27 DIAGNOSIS — I712 Thoracic aortic aneurysm, without rupture, unspecified: Secondary | ICD-10-CM

## 2021-07-27 MED ORDER — IOHEXOL 350 MG/ML SOLN
75.0000 mL | Freq: Once | INTRAVENOUS | Status: AC | PRN
  Administered 2021-07-27: 75 mL via INTRAVENOUS

## 2021-07-31 ENCOUNTER — Telehealth: Payer: Self-pay | Admitting: *Deleted

## 2021-07-31 DIAGNOSIS — I712 Thoracic aortic aneurysm, without rupture, unspecified: Secondary | ICD-10-CM

## 2021-07-31 NOTE — Telephone Encounter (Signed)
-----   Message from [name], MD sent at 07/30/2021  7:06 AM EDT ----- Please let Anthony Mosley know that his thoracic aortic aneurysm is stable in size compared with last year.  I recommend that he continue his current medications and follow-up as previously arranged.  We will plan to repeat a CTA of the chest in ~1 year.

## 2021-07-31 NOTE — Telephone Encounter (Signed)
The patient has been notified of the result and verbalized understanding.  All questions (if any) were answered. Pt will follow up as scheduled 02/01/22. Follow up reminder sent to this RN to schedule repeat CTA of the chest in ~1 year.

## 2021-08-03 ENCOUNTER — Telehealth: Payer: Self-pay | Admitting: Family Medicine

## 2021-08-03 NOTE — Telephone Encounter (Signed)
Left message for patient to call back and schedule the Medicare Annual Wellness Visit (AWV) virtually or by telephone.  Last AWV 08/08/20  Please schedule at anytime with [place].  40 minute appointment  Any questions, please call me at [phone]

## 2021-08-21 ENCOUNTER — Ambulatory Visit (INDEPENDENT_AMBULATORY_CARE_PROVIDER_SITE_OTHER): Payer: Medicare HMO

## 2021-08-21 VITALS — Ht 72.0 in | Wt 197.0 lb

## 2021-08-21 DIAGNOSIS — Z0000 Encounter for general adult medical examination without abnormal findings: Secondary | ICD-10-CM

## 2021-08-21 NOTE — Progress Notes (Signed)
I connected with Anthony Mosley today by telephone and verified that I am speaking with the correct person using two identifiers. Location patient: home Location provider: work Persons participating in the virtual visit: Jareese, Mandella LPN.   I discussed the limitations, risks, security and privacy concerns of performing an evaluation and management service by telephone and the availability of in person appointments. I also discussed with the patient that there may be a patient responsible charge related to this service. The patient expressed understanding and verbally consented to this telephonic visit.    Interactive audio and video telecommunications were attempted between this provider and patient, however failed, due to patient having technical difficulties OR patient did not have access to video capability.  We continued and completed visit with audio only.     Vital signs may be patient reported or missing.  Subjective:   Anthony Mosley is a 86 y.o. male who presents for Medicare Annual/Subsequent preventive examination.  Review of Systems     Cardiac Risk Factors include: advanced age (>53mn, >>78women);dyslipidemia;male gender     Objective:    Today's Vitals   08/21/21 0856  Weight: 197 lb (89.4 kg)  Height: 6' (1.829 m)   Body mass index is 26.72 kg/m.  Advanced Directives 08/21/2021 08/08/2020 07/30/2019 02/04/2019 01/28/2019 11/10/2018 11/10/2018  Does Patient Have a Medical Advance Directive? Yes Yes Yes Yes Yes No No  Type of AParamedicof APocahontasLiving will HCanovaLiving will HPhilippiLiving will Living will Living will - -  Does patient want to make changes to medical advance directive? - - No - Patient declined No - Patient declined No - Patient declined - -  Copy of HCloverlyin Chart? Yes - validated most recent copy scanned in chart (See row information)  Yes - validated most recent copy scanned in chart (See row information) Yes - validated most recent copy scanned in chart (See row information) - - - -  Would patient like information on creating a medical advance directive? - - - - - No - Patient declined No - Patient declined    Current Medications (verified) Outpatient Encounter Medications as of 08/21/2021  Medication Sig   apixaban (ELIQUIS) 2.5 MG TABS tablet Take 1 tablet (2.5 mg total) by mouth 2 (two) times daily.   atorvastatin (LIPITOR) 80 MG tablet Take 1 tablet (80 mg total) by mouth daily.   B COMPLEX VITAMINS PO Take 1 tablet by mouth daily.    Carboxymethylcellul-Glycerin (LUBRICATING EYE DROPS OP) Apply 1 drop to eye as needed (dry eyes).    cholecalciferol (VITAMIN D3) 25 MCG (1000 UNIT) tablet Take 1,000 Units by mouth daily.   diclofenac sodium (VOLTAREN) 1 % GEL Apply 4 g topically 4 (four) times daily. To the knee if needed   fluticasone (FLONASE) 50 MCG/ACT nasal spray Place into the nose.   furosemide (LASIX) 20 MG tablet Take 1 tablet (20 mg total) by mouth daily as needed. (Patient taking differently: Take 20 mg by mouth daily.)   Guaifenesin 1200 MG TB12 Take 1,200 mg by mouth at bedtime.   POTASSIUM CHLORIDE PO Take by mouth daily.   vitamin B-12 (CYANOCOBALAMIN) 250 MCG tablet Take 250 mcg by mouth daily.   No facility-administered encounter medications on file as of 08/21/2021.    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 seen on CT.   CAD (coronary artery disease)    a.  Elective R/LHC 06/12/18 showed mild-moderate nonobstructive RCA CAD with mild LAD myocardial bridging but no significant obstructive CAD; there was mildly elevated L heart filling pressure, moderately elevated R heart filling pressure, and mild pulm HTN.   Cancer [facility])    SKIN - Left wrist - tx  by Dr Koleen Nimrod in the past.   Chronic knee pain    Cough    Edema    FEET/LEGS   Former tobacco use    Hemorrhoids    Hyperbilirubinemia    Hyperlipidemia    Mild aortic insufficiency    Mild pulmonary hypertension (HCC)    Moderate mitral regurgitation    NICM (nonischemic cardiomyopathy) ([place])    a. EF 40-45% by echo 05/2017, improved to 45-50% by echo 10/2020.   Pleural effusion, left 11/2018   Pneumonia    PONV (postoperative nausea and vomiting)    after first cataract   Rosacea    Ruptured spleen    Squamous cell carcinoma of skin 11/27/2018   L inf mandible    Thoracic aortic aneurysm (HCC)    a. 4.4cm by CT 12/2017, imaged incompletely at 4.3cm in 10/2018 CT.   Wears dentures    partial upper   Wheezing    Past Surgical History:  Procedure Laterality Date   APPENDECTOMY     Dr Emilio Math   CARDIAC CATHETERIZATION  2005   CARDIAC CATHETERIZATION  2002   CATARACT EXTRACTION W/PHACO Right 10/24/2015   Procedure: CATARACT EXTRACTION PHACO AND INTRAOCULAR LENS PLACEMENT (IOC);  Surgeon: [name], MD;  Location: [facility];  Service: Ophthalmology;  Laterality: Right;  [place] 00:52 AP% 23.5 CDE 12.35 fluid pack lot # [id number]   CATARACT EXTRACTION W/PHACO Left 11/14/2015   Procedure: CATARACT EXTRACTION PHACO AND INTRAOCULAR LENS PLACEMENT (IOC);  Surgeon: [name], MD;  Location: [facility];  Service: Ophthalmology;  Laterality: Left;  [place] 00:55 AP% 19.1 CDE 10.66 fluid pack lot # [id number]   COLONOSCOPY  1987   COLONOSCOPY WITH PROPOFOL N/A 08/22/2017   Procedure: COLONOSCOPY WITH PROPOFOL;  Surgeon: [name], MD;  Location: [place];  Service: Gastroenterology;  Laterality: N/A;   EYE SURGERY     HERNIA REPAIR     umbilical/ Dr Archie Patten HERNIA REPAIR Right 02/04/2019   Procedure: HERNIA REPAIR INGUINAL WITH MESH;  Surgeon: Olean Ree, MD;  Location: [facility];  Service: General;  Laterality: Right;   IR ANGIOGRAM VISCERAL SELECTIVE   10/29/2018   IR EMBO ART  VEN HEMORR LYMPH EXTRAV  INC GUIDE ROADMAPPING  10/29/2018   IR THORACENTESIS ASP PLEURAL SPACE W/IMG GUIDE  11/23/2018   IR US GUIDE VASC ACCESS RIGHT  10/29/2018   KNEE ARTHROSCOPY Bilateral    POLYPECTOMY  08/22/2017   Procedure: POLYPECTOMY;  Surgeon: [name], MD;  Location: [place];  Service: Gastroenterology;;   RIGHT/LEFT HEART CATH AND CORONARY ANGIOGRAPHY N/A 06/13/2017   Procedure: Right/Left Heart Cath and Coronary Angiography;  Surgeon: [name], MD;  Location: [place] [facility];  Service: Cardiovascular;  Laterality: N/A;   TONSILLECTOMY     Family History  Problem Relation Age of Onset   Alzheimer's disease Mother    Pulmonary embolism Father    Diabetes Father    Heart attack Father 42   Diabetes Brother    Heart disease Brother        CABG  Heart disease Brother        CABG   Diabetes Brother    Healthy Brother    Social History   Socioeconomic History   Marital status: Married    Spouse name: Jeanett Schlein   Number of children: 2   Years of education: Not on file   Highest education level: 7th grade  Occupational History   Occupation: Retired  Tobacco Use   Smoking status: Former    Packs/day: 1.50    Years: 20.00    Pack years: 30.00    Types: Cigarettes    Quit date: 1987    Years since quitting: 35.7   Smokeless tobacco: Former    Types: Chew    Quit date: 2000   Tobacco comments:    smoking cessation materials not required  Scientific laboratory technician Use: Never used  Substance and Sexual Activity   Alcohol use: No    Alcohol/week: 0.0 standard drinks   Drug use: No   Sexual activity: Never  Other Topics Concern   Not on file  Social History Narrative   Pt's wife Anthony Mosley in nursing home   Social Determinants of Health   Financial Resource Strain: Low Risk    Difficulty of Paying Living Expenses: Not hard at all  Food Insecurity: No Food Insecurity   Worried About Charity fundraiser in the Last  Year: Never true   Arboriculturist in the Last Year: Never true  Transportation Needs: No Transportation Needs   Lack of Transportation (Medical): No   Lack of Transportation (Non-Medical): No  Physical Activity: Inactive   Days of Exercise per Week: 0 days   Minutes of Exercise per Session: 0 min  Stress: No Stress Concern Present   Feeling of Stress : Not at all  Social Connections: Not on file    Tobacco Counseling Counseling given: Not Answered Tobacco comments: smoking cessation materials not required   Clinical Intake:  Pre-visit preparation completed: Yes  Pain : No/denies pain     Nutritional Status: BMI 25 -29 Overweight Nutritional Risks: None Diabetes: No  How often do you need to have someone help you when you read instructions, pamphlets, or other written materials from your doctor or pharmacy?: 1 - Never What is the last grade level you completed in school?: 7th grade  Diabetic? no  Interpreter Needed?: No  Information entered by :: NAllen LPN   Activities of Daily Living In your present state of health, do you have any difficulty performing the following activities: 08/21/2021 09/07/2020  Hearing? N N  Vision? N N  Difficulty concentrating or making decisions? N Y  Walking or climbing stairs? Y Y  Comment due to knee -  Dressing or bathing? N N  Doing errands, shopping? N N  Preparing Food and eating ? N -  Using the Toilet? N -  In the past six months, have you accidently leaked urine? N -  Do you have problems with loss of bowel control? N -  Managing your Medications? N -  Managing your Finances? N -  Housekeeping or managing your Housekeeping? N -  Some recent data might be hidden    Patient Care Team: Olin Hauser, DO as PCP - General (Family Medicine) End, Harrell Gave, MD as PCP - Cardiology (Cardiology) Watt Climes, PA as Physician Assistant (Physician Assistant) [name], MD as Referring Physician  (Ophthalmology) Brendolyn Patty, MD as Consulting Physician (Dermatology) Idolina Primer, Franne Grip as Physician Assistant (  Physician Assistant) Johnathan Hausen, MD as Consulting Physician (General Surgery)  Indicate any recent Medical Services you may have received from other than [facility] providers in the past year (date may be approximate).     Assessment:   This is a routine wellness examination for Anthony Mosley.  Hearing/Vision screen Vision Screening - Comments:: Regular eye exams, [facility]  Dietary issues and exercise activities discussed: Current Exercise Habits: The patient does not participate in regular exercise at present (works in garden)   Goals Addressed             This [place]    Patient Stated       08/21/2021, get knee replacement       Depression Screen PHQ 2/9 Scores 08/21/2021 05/17/2021 02/02/2021 09/07/2020 08/08/2020 07/12/2020 05/04/2020  PHQ - 2 Score 0 0 0 1 1 0 1  PHQ- 9 Score - 0 6 - '3 2 3    '$ Fall Risk Fall Risk  08/21/2021 05/17/2021 02/02/2021 09/07/2020 08/08/2020  Falls in the past year? 0 0 0 1 1  Number falls in past yr: - 0 0 0 0  Comment - - - - -  Injury with Fall? - 0 0 0 0  Risk for fall due to : Medication side effect - - - History of fall(s)  Risk for fall due to: Comment - - - - -  Follow up Falls evaluation completed;Education provided;Falls prevention discussed Falls evaluation completed - Falls evaluation completed Falls prevention discussed    FALL RISK PREVENTION PERTAINING TO THE HOME:  Any stairs in or around the home? Yes  If so, are there any without handrails? No  Home free of loose throw rugs in walkways, pet beds, electrical cords, etc? Yes  Adequate lighting in your home to reduce risk of falls? Yes   ASSISTIVE DEVICES UTILIZED TO PREVENT FALLS:  Life alert? No  Use of a cane, walker or w/c? No  Grab bars in the bathroom? Yes  Shower chair or bench in shower? Yes  Elevated toilet seat or a handicapped toilet? No    TIMED UP AND GO:  Was the test performed? No .      Cognitive Function:     6CIT Screen 05/15/2018 04/07/2017  What Year? 0 points 0 points  What month? 3 points 0 points  What time? 0 points 0 points  Count back from 20 0 points 0 points  Months in reverse 0 points 4 points  Repeat phrase 0 points 2 points  Total Score 3 6    Immunizations Immunization History  Administered Date(s) Administered   Fluad Quad(high Dose 65+) 08/13/2019, 09/07/2020   HiB (PRP-T) 11/04/2018   Influenza, High Dose Seasonal PF 08/23/2015, 09/16/2016, 10/15/2017, 08/24/2018   Influenza, Seasonal, Injecte, Preservative Fre 08/27/2012   Influenza,inj,Quad PF,6+ Mos 08/23/2013, 09/05/2014   Meningococcal Mcv4o 11/04/2018   PFIZER(Purple Top)SARS-COV-2 Vaccination 01/11/2020, 02/01/2020   Pneumococcal Conjugate-13 11/17/2014   Pneumococcal Polysaccharide-23 12/16/2008, 11/04/2018   Zoster, Live 06/13/2009    TDAP status: Due, Education has been provided regarding the importance of this vaccine. Advised may receive this vaccine at local pharmacy or Health Dept. Aware to provide a copy of the vaccination record if obtained from local pharmacy or Health Dept. Verbalized acceptance and understanding.  Flu Vaccine status: Due, Education has been provided regarding the importance of this vaccine. Advised may receive this vaccine at local pharmacy or Health Dept. Aware to provide a copy of the vaccination record if obtained from local  pharmacy or Health Dept. Verbalized acceptance and understanding.  Pneumococcal vaccine status: Up to date  Covid-19 vaccine status: Completed vaccines  Qualifies for Shingles Vaccine? Yes   Zostavax completed Yes   Shingrix Completed?: No.    Education has been provided regarding the importance of this vaccine. Patient has been advised to call insurance company to determine out of pocket expense if they have not yet received this vaccine. Advised may also receive vaccine  at local pharmacy or Health Dept. Verbalized acceptance and understanding.  Screening Tests Health Maintenance  Topic Date Due   Zoster Vaccines- Shingrix (1 of 2) Never done   COVID-19 Vaccine (3 - Pfizer risk series) 02/29/2020   INFLUENZA VACCINE  07/16/2021   TETANUS/TDAP  12/16/2026 (Originally 11/28/1954)   PNA vac Low Risk Adult  Completed   HPV VACCINES  Aged Out    Health Maintenance  Health Maintenance Due  Topic Date Due   Zoster Vaccines- Shingrix (1 of 2) Never done   COVID-19 Vaccine (3 - Pfizer risk series) 02/29/2020   INFLUENZA VACCINE  07/16/2021    Colorectal cancer screening: No longer required.   Lung Cancer Screening: (Low Dose CT Chest recommended if Age 21-80 years, 30 pack-year currently smoking OR have quit w/in 15years.) does not qualify.   Lung Cancer Screening Referral: no  Additional Screening:  Hepatitis C Screening: does not qualify;   Vision Screening: Recommended annual ophthalmology exams for early detection of glaucoma and other disorders of the eye. Is the patient up to date with their annual eye exam?  Yes  Who is the provider or what is the name of the office in which the patient attends annual eye exams? [facility] If pt is not established with a provider, would they like to be referred to a provider to establish care? No .   Dental Screening: Recommended annual dental exams for proper oral hygiene  Community Resource Referral / Chronic Care Management: CRR required this visit?  No   CCM required this visit?  No      Plan:     I have personally reviewed and noted the following in the patient's chart:   Medical and social history Use of alcohol, tobacco or illicit drugs  Current medications and supplements including opioid prescriptions. Patient is not currently taking opioid prescriptions. Functional ability and status Nutritional status Physical activity Advanced directives List of other  physicians Hospitalizations, surgeries, and ER visits in previous 12 months Vitals Screenings to include cognitive, depression, and falls Referrals and appointments  In addition, I have reviewed and discussed with patient certain preventive protocols, quality metrics, and best practice recommendations. A written personalized care plan for preventive services as well as general preventive health recommendations were provided to patient.     Kellie Simmering, LPN   [id number]   Nurse Notes: 6 CIT not administered. Patient appears cognitive per conversation.

## 2021-08-21 NOTE — Patient Instructions (Signed)
Anthony Mosley , Thank you for taking time to come for your Medicare Wellness Visit. I appreciate your ongoing commitment to your health goals. Please review the following plan we discussed and let me know if I can assist you in the future.   Screening recommendations/referrals: Colonoscopy: not required Recommended yearly ophthalmology/optometry visit for glaucoma screening and checkup Recommended yearly dental visit for hygiene and checkup  Vaccinations: Influenza vaccine: due Pneumococcal vaccine: completed 11/04/2018 Tdap vaccine: due Shingles vaccine: discussed   Covid-19: 02/01/2020, 01/11/2020  Advanced directives: copy in chart  Conditions/risks identified: none  Next appointment: Follow up in one year for your annual wellness visit.   Preventive Care 34 Years and Older, Male Preventive care refers to lifestyle choices and visits with your health care provider that can promote health and wellness. What does preventive care include? A yearly physical exam. This is also called an annual well check. Dental exams once or twice a year. Routine eye exams. Ask your health care provider how often you should have your eyes checked. Personal lifestyle choices, including: Daily care of your teeth and gums. Regular physical activity. Eating a healthy diet. Avoiding tobacco and drug use. Limiting alcohol use. Practicing safe sex. Taking low doses of aspirin every day. Taking vitamin and mineral supplements as recommended by your health care provider. What happens during an annual well check? The services and screenings done by your health care provider during your annual well check will depend on your age, overall health, lifestyle risk factors, and family history of disease. Counseling  Your health care provider may ask you questions about your: Alcohol use. Tobacco use. Drug use. Emotional well-being. Home and relationship well-being. Sexual activity. Eating habits. History of  falls. Memory and ability to understand (cognition). Work and work Statistician. Screening  You may have the following tests or measurements: Height, weight, and BMI. Blood pressure. Lipid and cholesterol levels. These may be checked every 5 years, or more frequently if you are over 22 years old. Skin check. Lung cancer screening. You may have this screening every year starting at age 30 if you have a 30-pack-year history of smoking and currently smoke or have quit within the past 15 years. Fecal occult blood test (FOBT) of the stool. You may have this test every year starting at age 79. Flexible sigmoidoscopy or colonoscopy. You may have a sigmoidoscopy every 5 years or a colonoscopy every 10 years starting at age 26. Prostate cancer screening. Recommendations will vary depending on your family history and other risks. Hepatitis C blood test. Hepatitis B blood test. Sexually transmitted disease (STD) testing. Diabetes screening. This is done by checking your blood sugar (glucose) after you have not eaten for a while (fasting). You may have this done every 1-3 years. Abdominal aortic aneurysm (AAA) screening. You may need this if you are a current or former smoker. Osteoporosis. You may be screened starting at age 74 if you are at high risk. Talk with your health care provider about your test results, treatment options, and if necessary, the need for more tests. Vaccines  Your health care provider may recommend certain vaccines, such as: Influenza vaccine. This is recommended every year. Tetanus, diphtheria, and acellular pertussis (Tdap, Td) vaccine. You may need a Td booster every 10 years. Zoster vaccine. You may need this after age 54. Pneumococcal 13-valent conjugate (PCV13) vaccine. One dose is recommended after age 67. Pneumococcal polysaccharide (PPSV23) vaccine. One dose is recommended after age 15. Talk to your health care provider about  which screenings and vaccines you need and  how often you need them. This information is not intended to replace advice given to you by your health care provider. Make sure you discuss any questions you have with your health care provider. Document Released: 12/29/2015 Document Revised: 08/21/2016 Document Reviewed: 10/03/2015 Elsevier Interactive Patient Education  2017 [employer] Prevention in the Home Falls can cause injuries. They can happen to people of all ages. There are many things you can do to make your home safe and to help prevent falls. What can I do on the outside of my home? Regularly fix the edges of walkways and driveways and fix any cracks. Remove anything that might make you trip as you walk through a door, such as a raised step or threshold. Trim any bushes or trees on the path to your home. Use bright outdoor lighting. Clear any walking paths of anything that might make someone trip, such as rocks or tools. Regularly check to see if handrails are loose or broken. Make sure that both sides of any steps have handrails. Any raised decks and porches should have guardrails on the edges. Have any leaves, snow, or ice cleared regularly. Use sand or salt on walking paths during winter. Clean up any spills in your garage right away. This includes oil or grease spills. What can I do in the bathroom? Use night lights. Install grab bars by the toilet and in the tub and shower. Do not use towel bars as grab bars. Use non-skid mats or decals in the tub or shower. If you need to sit down in the shower, use a plastic, non-slip stool. Keep the floor dry. Clean up any water that spills on the floor as soon as it happens. Remove soap buildup in the tub or shower regularly. Attach bath mats securely with double-sided non-slip rug tape. Do not have throw rugs and other things on the floor that can make you trip. What can I do in the bedroom? Use night lights. Make sure that you have a light by your bed that is easy to  reach. Do not use any sheets or blankets that are too big for your bed. They should not hang down onto the floor. Have a firm chair that has side arms. You can use this for support while you get dressed. Do not have throw rugs and other things on the floor that can make you trip. What can I do in the kitchen? Clean up any spills right away. Avoid walking on wet floors. Keep items that you use a lot in easy-to-reach places. If you need to reach something above you, use a strong step stool that has a grab bar. Keep electrical cords out of the way. Do not use floor polish or wax that makes floors slippery. If you must use wax, use non-skid floor wax. Do not have throw rugs and other things on the floor that can make you trip. What can I do with my stairs? Do not leave any items on the stairs. Make sure that there are handrails on both sides of the stairs and use them. Fix handrails that are broken or loose. Make sure that handrails are as long as the stairways. Check any carpeting to make sure that it is firmly attached to the stairs. Fix any carpet that is loose or worn. Avoid having throw rugs at the top or bottom of the stairs. If you do have throw rugs, attach them to the floor with  carpet tape. Make sure that you have a light switch at the top of the stairs and the bottom of the stairs. If you do not have them, ask someone to add them for you. What else can I do to help prevent falls? Wear shoes that: Do not have high heels. Have rubber bottoms. Are comfortable and fit you well. Are closed at the toe. Do not wear sandals. If you use a stepladder: Make sure that it is fully opened. Do not climb a closed stepladder. Make sure that both sides of the stepladder are locked into place. Ask someone to hold it for you, if possible. Clearly mark and make sure that you can see: Any grab bars or handrails. First and last steps. Where the edge of each step is. Use tools that help you move  around (mobility aids) if they are needed. These include: Canes. Walkers. Scooters. Crutches. Turn on the lights when you go into a dark area. Replace any light bulbs as soon as they burn out. Set up your furniture so you have a clear path. Avoid moving your furniture around. If any of your floors are uneven, fix them. If there are any pets around you, be aware of where they are. Review your medicines with your doctor. Some medicines can make you feel dizzy. This can increase your chance of falling. Ask your doctor what other things that you can do to help prevent falls. This information is not intended to replace advice given to you by your health care provider. Make sure you discuss any questions you have with your health care provider. Document Released: 09/28/2009 Document Revised: 05/09/2016 Document Reviewed: 01/06/2015 Elsevier Interactive Patient Education  2017 [employer].

## 2021-09-09 NOTE — Discharge Instructions (Signed)
Instructions after Total Knee Replacement   Anthony Mosley Anthony Mosley, Jr., M.D.     Dept. of Orthopaedics & Sports Medicine  [facility]  [address]  [place], [place]  [address]  Phone: [phone]   Fax: [phone]    DIET: Drink plenty of non-alcoholic fluids. Resume your normal diet. Include foods high in fiber.  ACTIVITY:  You may use crutches or a walker with weight-bearing as tolerated, unless instructed otherwise. You may be weaned off of the walker or crutches by your Physical Therapist.  Do NOT place pillows under the knee. Anything placed under the knee could limit your ability to straighten the knee.   Continue doing gentle exercises. Exercising will reduce the pain and swelling, increase motion, and prevent muscle weakness.   Please continue to use the TED compression stockings for 6 weeks. You may remove the stockings at night, but should reapply them in the morning. Do not drive or operate any equipment until instructed.  WOUND CARE:  Continue to use the PolarCare or ice packs periodically to reduce pain and swelling. You may bathe or shower after the staples are removed at the first office visit following surgery.  MEDICATIONS: You may resume your regular medications. Please take the pain medication as prescribed on the medication. Do not take pain medication on an empty stomach. You have been given a prescription for a blood thinner (Lovenox or Coumadin). Please take the medication as instructed. (NOTE: After completing a 2 week course of Lovenox, take one Enteric-coated aspirin once a day. This along with elevation will help reduce the possibility of phlebitis in your operated leg.) Do not drive or drink alcoholic beverages when taking pain medications.  CALL THE OFFICE FOR: Temperature above 101 degrees Excessive bleeding or drainage on the dressing. Excessive swelling, coldness, or paleness of the toes. Persistent nausea and vomiting.  FOLLOW-UP:  You  should have an appointment to return to the office in 10-14 days after surgery. Arrangements have been made for continuation of Physical Therapy (either home therapy or outpatient therapy).   [facility] Department Directory         [email or web address]       [email or web address]          Cardiology  Appointments: [place] - [phone] [place] - [phone]  Endocrinology  Appointments: [place] - [phone] [place] - [phone]  Gastroenterology  Appointments: [place] - [phone] [place] - [phone]        General Surgery   Appointments: [place] - [phone]  Internal Medicine/Family Medicine  Appointments: [place] - [phone] [place] - [phone] [place] - [phone]  Metabolic and Weigh Loss Surgery  Appointments: [place] - [phone]        Neurology  Appointments: [place] - [phone] [place] - [phone]  Neurosurgery  Appointments: [place] - [phone]  Obstetrics & Gynecology  Appointments: [place] - [phone] [place] - [phone]        Pediatrics  Appointments: [place] - [phone] [place] - [phone]  Physiatry  Appointments: [place] -[phone]  Physical Therapy  Appointments: [place] - [phone] [place] - [phone]        Podiatry  Appointments: [place] - [phone] [place] - [phone]  Pulmonology  Appointments: [place] - [phone]  Rheumatology  Appointments: [place] - [phone]        [place] Location: [facility]  [address] , [place]  [address]  [place] Location: [facility] [address] [place], [place]  [address]  [place] Location: [facility] [address] [place], [place]  [address]    

## 2021-09-10 ENCOUNTER — Telehealth: Payer: Self-pay | Admitting: *Deleted

## 2021-09-10 ENCOUNTER — Other Ambulatory Visit: Payer: Self-pay

## 2021-09-10 ENCOUNTER — Encounter: Payer: Self-pay | Admitting: Orthopedic Surgery

## 2021-09-10 ENCOUNTER — Other Ambulatory Visit
Admission: RE | Admit: 2021-09-10 | Discharge: 2021-09-10 | Disposition: A | Discharge status: Home / Self Care | Payer: Medicare HMO | Source: Ambulatory Visit | Attending: Orthopedic Surgery | Admitting: Orthopedic Surgery

## 2021-09-10 DIAGNOSIS — Z01812 Encounter for preprocedural laboratory examination: Secondary | ICD-10-CM | POA: Insufficient documentation

## 2021-09-10 HISTORY — DX: Paroxysmal atrial fibrillation: I48.0

## 2021-09-10 HISTORY — DX: Gastro-esophageal reflux disease without esophagitis: K21.9

## 2021-09-10 HISTORY — DX: Atrial premature depolarization: I49.1

## 2021-09-10 HISTORY — DX: Unspecified right bundle-branch block: I45.10

## 2021-09-10 LAB — COMPREHENSIVE METABOLIC PANEL
ALT: 19 U/L (ref 0–44)
AST: 25 U/L (ref 15–41)
Albumin: 4.2 g/dL (ref 3.5–5.0)
Alkaline Phosphatase: 62 U/L (ref 38–126)
Anion gap: 5 (ref 5–15)
BUN: 22 mg/dL (ref 8–23)
CO2: 31 mmol/L (ref 22–32)
Calcium: 9 mg/dL (ref 8.9–10.3)
Chloride: 102 mmol/L (ref 98–111)
Creatinine, Ser: 1.28 mg/dL — ABNORMAL HIGH (ref 0.61–1.24)
GFR, Estimated: 56 mL/min — ABNORMAL LOW (ref >60)
Glucose, Bld: 98 mg/dL (ref 70–99)
Potassium: 4.1 mmol/L (ref 3.5–5.1)
Sodium: 138 mmol/L (ref 135–145)
Total Bilirubin: 1.7 mg/dL — ABNORMAL HIGH (ref 0.3–1.2)
Total Protein: 7.1 g/dL (ref 6.5–8.1)

## 2021-09-10 LAB — C-REACTIVE PROTEIN: CRP: 0.6 mg/dL (ref <1.0)

## 2021-09-10 LAB — CBC
HCT: 40.6 % (ref 39.0–52.0)
Hemoglobin: 13.8 g/dL (ref 13.0–17.0)
MCH: 32.1 pg (ref 26.0–34.0)
MCHC: 34 g/dL (ref 30.0–36.0)
MCV: 94.4 fL (ref 80.0–100.0)
Platelets: 176 10*3/uL (ref 150–400)
RBC: 4.3 MIL/uL (ref 4.22–5.81)
RDW: 13.4 % (ref 11.5–15.5)
WBC: 6.3 10*3/uL (ref 4.0–10.5)
nRBC: 0 % (ref 0.0–0.2)

## 2021-09-10 LAB — PROTIME-INR
INR: 1 (ref 0.8–1.2)
Prothrombin Time: 13.3 seconds (ref 11.4–15.2)

## 2021-09-10 LAB — URINALYSIS, ROUTINE W REFLEX MICROSCOPIC
Bilirubin Urine: NEGATIVE
Glucose, UA: NEGATIVE mg/dL
Hgb urine dipstick: NEGATIVE
Ketones, ur: NEGATIVE mg/dL
Leukocytes,Ua: NEGATIVE
Nitrite: NEGATIVE
Protein, ur: NEGATIVE mg/dL
Specific Gravity, Urine: 1.008 (ref 1.005–1.030)
pH: 7 (ref 5.0–8.0)

## 2021-09-10 LAB — TYPE AND SCREEN
ABO/RH(D): AB POS
Antibody Screen: NEGATIVE

## 2021-09-10 LAB — SURGICAL PCR SCREEN
MRSA, PCR: NEGATIVE
Staphylococcus aureus: NEGATIVE

## 2021-09-10 LAB — APTT: aPTT: 38 seconds — ABNORMAL HIGH (ref 24–36)

## 2021-09-10 LAB — SEDIMENTATION RATE: Sed Rate: 6 mm/hr (ref 0–20)

## 2021-09-10 NOTE — Patient Instructions (Addendum)
Your procedure is scheduled on:09-19-21 Wednesday Report to the Registration Desk on the 1st floor of the [facility].Then proceed to the 2nd floor Surgery Desk in the [facility] To find out your arrival time, please call [phone] between 1PM - 3PM on:09-18-21 Tuesday  REMEMBER: Instructions that are not followed completely may result in serious medical risk, up to and including death; or upon the discretion of your surgeon and anesthesiologist your surgery may need to be rescheduled.  Do not eat food after midnight the night before surgery.  No gum chewing, lozengers or hard candies.  You may however, drink CLEAR liquids up to 2 hours before you are scheduled to arrive for your surgery. Do not drink anything within 2 hours of your scheduled arrival time.  Clear liquids include: - water  - apple juice without pulp - gatorade (not RED, PURPLE, OR BLUE) - black coffee or tea (Do NOT add milk or creamers to the coffee or tea) Do NOT drink anything that is not on this list.  In addition, your doctor has ordered for you to drink the provided  Ensure Pre-Surgery Clear Carbohydrate Drink  Drinking this carbohydrate drink up to two hours before surgery helps to reduce insulin resistance and improve patient outcomes. Please complete drinking 2 hours prior to scheduled arrival time.  Do not take any medication the morning of surgery  Stop your Eliquis 3 days prior to surgery-Last dose will be on 09-15-21 (Saturday)  One week prior to surgery: Stop Anti-inflammatories (NSAIDS) such as Advil, Aleve, Ibuprofen, Motrin, Naproxen, Naprosyn and Aspirin based products such as Excedrin, Goodys Powder, BC Powder.You may however, continue to take Tylenol if needed for pain up until the day of surgery.  Stop ANY OVER THE COUNTER supplements/vitamins 7 days prior to your surgery (B Complex, Collagen, Vitamin D3 and Vitamin B12) However, continue your potassium up until the day prior to surgery  No  Alcohol for 24 hours before or after surgery.  No Smoking including e-cigarettes for 24 hours prior to surgery.  No chewable tobacco products for at least 6 hours prior to surgery.  No nicotine patches on the day of surgery.  Do not use any "recreational" drugs for at least a week prior to your surgery.  Please be advised that the combination of cocaine and anesthesia may have negative outcomes, up to and including death. If you test positive for cocaine, your surgery will be cancelled.  On the morning of surgery brush your teeth with toothpaste and water, you may rinse your mouth with mouthwash if you wish. Do not swallow any toothpaste or mouthwash.  Use CHG Soap as directed on instruction sheet.  Do not wear jewelry, make-up, hairpins, clips or nail polish.  Do not wear lotions, powders, or perfumes.   Do not shave body from the neck down 48 hours prior to surgery just in case you cut yourself which could leave a site for infection.  Also, freshly shaved skin may become irritated if using the CHG soap.  Contact lenses, hearing aids and dentures may not be worn into surgery.  Do not bring valuables to the hospital. [facility] is not responsible for any missing/lost belongings or valuables.   Notify your doctor if there is any change in your medical condition (cold, fever, infection).  Wear comfortable clothing (specific to your surgery type) to the hospital.  After surgery, you can help prevent lung complications by doing breathing exercises.  Take deep breaths and cough every 1-2 hours.  Your doctor may order a device called an Incentive Spirometer to help you take deep breaths. When coughing or sneezing, hold a pillow firmly against your incision with both hands. This is called "splinting." Doing this helps protect your incision. It also decreases belly discomfort.  If you are being admitted to the hospital overnight, leave your suitcase in the car. After surgery it may be  brought to your room.  If you are being discharged the day of surgery, you will not be allowed to drive home. You will need a responsible adult (18 years or older) to drive you home and stay with you that night.   If you are taking public transportation, you will need to have a responsible adult (18 years or older) with you. Please confirm with your physician that it is acceptable to use public transportation.   Please call the [place] Dept. at [phone] if you have any questions about these instructions.  Surgery Visitation Policy:  Patients undergoing a surgery or procedure may have one family member or support person with them as long as that person is not COVID-19 positive or experiencing its symptoms.  That person may remain in the waiting area during the procedure and may rotate out with other people.  Inpatient Visitation:    Visiting hours are 7 a.m. to 8 p.m. Up to two visitors ages 16+ are allowed at one time in a patient room. The visitors may rotate out with other people during the day. Visitors must check out when they leave, or other visitors will not be allowed. One designated support person may remain overnight. The visitor must pass COVID-19 screenings, use hand sanitizer when entering and exiting the patient's room and wear a mask at all times, including in the patient's room. Patients must also wear a mask when staff or their visitor are in the room. Masking is required regardless of vaccination status.

## 2021-09-10 NOTE — Telephone Encounter (Signed)
Covering preop today. Will route to pharm then patient will need call. Last OV 07/2021.

## 2021-09-10 NOTE — Telephone Encounter (Signed)
Request for pre-operative cardiac clearance Received: Today Karen Kitchens, NP  P Cv Div Preop Callback Request for pre-operative cardiac clearance:     1. What type of surgery is being performed?  COMPUTER ASSISTED TOTAL KNEE ARTHROPLASTY   2. When is this surgery scheduled?  09/19/2021     3. Are there any medications that need to be held prior to surgery?  APIXABAN   4. Practice name and name of physician performing surgery?  Performing surgeon: Dr. Skip Estimable, MD  Requesting clearance: Honor Loh, FNP-C       5. Anesthesia type (none, local, MAC, general)? General   6. What is the office phone and fax number?    Phone: [phone]  Fax: [phone]   ATTENTION: Unable to create telephone message as per your standard workflow. Directed by [facility] providers to send requests for cardiac clearance to this pool for appropriate distribution to provider covering pre-operative clearances.   Honor Loh, MSN, APRN, FNP-C, CEN  [facility]  Peri-operative Services Nurse Practitioner  Phone: [phone]  09/10/21 10:46 AM

## 2021-09-10 NOTE — Telephone Encounter (Signed)
Patient with diagnosis of paroxsymal atrial fibrillation and premature atrial contractions on Eliquis for anticoagulation.    Procedure: Total knee arthroplasty Date of procedure: 09/19/2021  CHA2DS2-VASc Score = 4  This indicates a 4.8% annual risk of stroke. The patient's score is based upon: CHF History: 1 HTN History: 0 Diabetes History: 0 Stroke History: 0 Vascular Disease History: 1 Age Score: 2 Gender Score: 0   CrCl 46 (Scr 1.28) Platelet count 176k  Per office protocol, patient can hold Eliquis for 3  days prior to procedure.    Patient should restart Eliquis on the evening of procedure or day after, at discretion of procedure MD  For orthopedic procedures please be sure to resume therapeutic (not prophylactic) dosing.   Also recommend increasing patients Eliquis to 5 mg BID based upon age, weight, and renal function.

## 2021-09-12 ENCOUNTER — Encounter: Payer: Self-pay | Admitting: Orthopedic Surgery

## 2021-09-12 DIAGNOSIS — M1711 Unilateral primary osteoarthritis, right knee: Secondary | ICD-10-CM | POA: Diagnosis not present

## 2021-09-12 NOTE — Progress Notes (Signed)
Perioperative Services  Pre-Admission/Anesthesia Testing Clinical Review  Date: 09/13/21  Patient Demographics:  Name: Anthony Mosley DOB:   1935-10-14 MRN:   [id number]  Planned Surgical Procedure(s):   Case: [id number] Date/Time: 09/19/21 0815  Procedure: COMPUTER ASSISTED TOTAL KNEE ARTHROPLASTY (Right: Knee)  Anesthesia type: Choice  Pre-op diagnosis: PRIMARY OSTEOARTHRITIS OF RIGHT KNEE.  Location: [facility] OR ROOM 01 / [place] [facility]  Surgeons: Dereck Leep, MD   NOTE: Available PAT nursing documentation and vital signs have been reviewed. Clinical nursing staff has updated patient's PMH/PSHx, current medication list, and drug allergies/intolerances to ensure comprehensive history available to assist in medical decision making as it pertains to the aforementioned surgical procedure and anticipated anesthetic course. Extensive review of available clinical information performed. [place] PMH and PSHx updated with any diagnoses/procedures that  may have been inadvertently omitted during his intake with the pre-admission testing department's nursing staff.  Clinical Discussion:  Anthony Mosley is a 86 y.o. male who is submitted for pre-surgical anesthesia review and clearance prior to him undergoing the above procedure. Patient is a Former Smoker (30 pack years; quit 12/1985). Pertinent PMH includes: CAD, PAF, HFrEF,  TAA, aortic root dilatation, and ICM, aortic insufficiency, mitral valve regurgitation, PAH, RBBB, prolonged QT syndrome, peripheral edema, HLD, CKD-III, GERD (uses CaCO3 tablets PRN), OSA, BPH.  Patient is followed by cardiology (End, MD). He was last seen in the cardiology clinic 07/18/2021; notes reviewed.  At the time of his clinic visit, patient doing well overall from a cardiovascular perspective.  He denied any episodes of chest pain, however had chronic exertional dyspnea related to his COPD diagnosis and age-related deconditioning.  Patient  denied any PND, orthopnea, palpitations, vertiginous symptoms, or presyncope/syncope.  Patient with chronic peripheral edema.  PMH significant for cardiovascular diagnoses.    Patient underwent diagnostic left heart catheterization on 01/29/2001 revealing an LVEF of 53.  There was mild to moderate nonobstructive CAD noted; vessels of the proximal RCA, 40% stenosis of the mid RCA, and 40% mid LAD.  No PCI performed; treated medically.  Repeat diagnostic left heart catheterization performed on 07/20/2004 revealing an LVEF of 55%.  There was no obstructive CAD.  Minor luminal irregularities noted in the LAD and RCA.  No intervention was performed; treated medically.  TTE was performed on 05/29/2017 revealing a mildly to moderately reduced left ventricular systolic function with an EF of 40-45%.  Diastolic Doppler parameters consistent with impaired relaxation (G1DD).  There was mild aortic and moderate mitral valve regurgitation.  Left atrium mildly dilated.  There was ectasia of the aortic root (38 mm) and ascending aorta (42 mm).  Diagnostic right/left heart catheterization performed on 06/13/2017 revealing mild to moderate nonobstructive CAD involving the RCA.  There was myocardial bridging noted within the mid LAD.  LVEDP 20-23 mmHg.  RVSP 40 mmHg.  Fick cardiac output 6.6 L/min.  Fick cardiac index 2.9 L/min/m.  Long-term cardiac event monitor study performed on 11/19/2018 revealed a predominant underlying sinus rhythm with an average heart rate of 75 bpm (range 48-202 bpm).  There was a frequent irregular regular rhythm observed with the longest episode lasting 40 minutes; atrial fibrillation burden approximately 30%.  There were no sustained ventricular arrhythmia or prolonged pauses.  Patient has undergone surveillance monitoring of his cardiac function.  Last TTE was performed on 10/25/2020 revealing a mildly decreased left ventricular systolic function with an EF of 45-50%.  There was mild right  atrial enlargement.  Additionally, there was mild  mitral and aortic valve regurgitation with no evidence of stenosis.  Aortic root dilated at 40 mm and the ascending aorta dilated at 44 mm.  CTA performed on 07/27/2021 revealed known AAA measuring 4.5 cm in greatest diameter.  PMH significant for PAF diagnosis;CHA2DS2-VASc Score = 4 (age x 2, HFrEF, aortic plaque).  Patient not requiring pharmacological intervention for rate and rhythm management.  He is chronically anticoagulated using daily apixaban; compliant with therapy with no evidence of GI bleeding.  Blood pressure well controlled at 110/70 on currently prescribed diuretic monotherapy.  Patient is on a statin for his HLD.  Patient is not diabetic.  Functional capacity limited by orthopedic pain, however felt to be able to achieve at least 4 METS of activity.  No changes were made to patient's medication regimen.  Patient to follow-up with outpatient cardiology in 6 months or sooner if needed.  Anthony Mosley is scheduled for an elective RIGHT COMPUTER ASSISTED TOTAL KNEE ARTHROPLASTY on 09/19/2021 with Dr. Skip Estimable, MD. Given patient's past medical history significant for cardiovascular diagnoses, presurgical cardiac clearance was sought by the PAT team. Per cardiology, "based on patient's past medical history and time since his last clinic visit, patient would be at an overall ACCEPTABLE risk for the planned procedure without further cardiovascular testing or intervention at this time". Again, this patient is on daily anticoagulation therapy. He has been instructed on recommendations for holding his apixaban for 3 days prior to his procedure with plans to restart as soon as postoperative bleeding risk felt to be minimized by his attending surgeon. The patient has been instructed that his last dose of his anticoagulant will be on 09/15/2021.  Patient denies previous perioperative complications with anesthesia in the past. In review of the  available records, it is noted that patient underwent a general anesthetic course here (ASA III) in 01/2019 without documented complications.   Vitals with BMI 09/10/2021 08/21/2021 07/18/2021  Height 6\' 0"  6\' 0"  6\' 1"   Weight 199 lbs 8 oz 197 lbs 203 lbs  BMI 27.05 57.84 69.62  Systolic 952 (No Data) 841  Diastolic 67 (No Data) 70  Pulse 53 (No Data) 53    Providers/Specialists:   NOTE: Primary physician provider listed below. Patient may have been seen by APP or partner within same practice.   PROVIDER ROLE / SPECIALTY LAST OV  Hooten, Laurice Record, MD ORTHOPEDICS (SURGEON) 04/19/2021  Olin Hauser, DO PRIMARY CARE PROVIDER 05/17/2021  [name], MD CARDIOLOGY 07/18/2021   Allergies:  Macrolides and ketolides, Norvasc [amlodipine], Avodart [dutasteride], and Tape  Current Home Medications:   No current facility-administered medications for this encounter.    apixaban (ELIQUIS) 2.5 MG TABS tablet   atorvastatin (LIPITOR) 80 MG tablet   B COMPLEX VITAMINS PO   Carboxymethylcellul-Glycerin (LUBRICATING EYE DROPS OP)   cholecalciferol (VITAMIN D3) 25 MCG (1000 UNIT) tablet   Cyanocobalamin (B-12) 2500 MCG TABS   diclofenac sodium (VOLTAREN) 1 % GEL   fluticasone (FLONASE) 50 MCG/ACT nasal spray   furosemide (LASIX) 20 MG tablet   Guaifenesin 1200 MG TB12   Potassium 99 MG TABS   calcium carbonate (TUMS - DOSED IN MG ELEMENTAL CALCIUM) 500 MG chewable tablet   COLLAGEN PO   Tetrahydroz-Dextran-PEG-Povid (EYE DROPS ADVANCED RELIEF OP)   History:   Past Medical History:  Diagnosis Date   Aortic atherosclerosis (HCC)    Aortic root dilatation (HCC)    a.) TTE 05/29/2017 --> measured 28 mm. b.)TTE 10/25/2020 --> measured 40 mm.  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 seen on CT.   CAD (coronary artery disease)    a.  Elective R/LHC 06/12/18 showed mild-moderate  nonobstructive RCA CAD with mild LAD myocardial bridging but no significant obstructive CAD; there was mildly elevated L heart filling pressure, moderately elevated R heart filling pressure, and mild pulm HTN.   Chronic anticoagulation    Apixaban   Chronic knee pain    CKD (chronic kidney disease), stage III (HCC)    Cough    DJD (degenerative joint disease)    Erectile dysfunction    Former tobacco use    GERD (gastroesophageal reflux disease)    occ-tums prn   Hemorrhoids    HFrEF (heart failure with reduced ejection fraction) (HCC)    Hyperbilirubinemia    Hyperlipidemia    Mild aortic insufficiency    Mild pulmonary hypertension (HCC)    Moderate mitral regurgitation    NICM (nonischemic cardiomyopathy) ([place])    a. EF 40-45% by echo 05/2017, improved to 45-50% by echo 10/2020.   PAC (premature atrial contraction)    Paroxysmal A-fib (HCC)    Pleural effusion, left 11/2018   Pneumonia    Prolonged QT syndrome    RBBB    Rosacea    Ruptured spleen    Skin cancer    Left wrist - tx by Dr Koleen Nimrod in the past.   Squamous cell carcinoma of skin 11/27/2018   L inf mandible    Thoracic aortic aneurysm ([place])    a.) TTE 05/29/2017 --> 4.2 cm . b.) CT 12/2017 --> measured 4.4 cm, imaged incompletely at 4.3cm in 10/2018 CT. c.) CTA 07/27/2021 --> measured 4.5 cm.   Varicocele    Wears dentures    partial upper   Wheezing    Past Surgical History:  Procedure Laterality Date   ABDOMINAL SURGERY     due to ruptured spleen   APPENDECTOMY     Dr Emilio Math   CARDIAC CATHETERIZATION Left 07/20/2004   Procedure: CARDIAC CATHETERIZATION; Location: [facility]; Surgeon: Eustace Quail, MD   CARDIAC CATHETERIZATION Left 01/29/2001   Procedure: CARDIAC CATHETERIZATION; Location: [place]; Surgeon: Neoma Laming, MD   CATARACT EXTRACTION W/PHACO Right 10/24/2015   Procedure: CATARACT EXTRACTION PHACO AND INTRAOCULAR LENS PLACEMENT ([place]);  Surgeon: [name], MD;  Location: [facility];  Service:  Ophthalmology;  Laterality: Right;  [place] 00:52 AP% 23.5 CDE 12.35 fluid pack lot # [id number]   CATARACT EXTRACTION W/PHACO Left 11/14/2015   Procedure: CATARACT EXTRACTION PHACO AND INTRAOCULAR LENS PLACEMENT (I[facility]);  Surgeon: [name], MD;  Location: [facility];  Service: Ophthalmology;  Laterality: Left;  [place] 00:55 AP% 19.1 CDE 10.66 fluid pack lot # [id number]   COLONOSCOPY  1987   COLONOSCOPY WITH PROPOFOL N/A 08/22/2017   Procedure: COLONOSCOPY WITH PROPOFOL;  Surgeon: [name], MD;  Location: [place];  Service: Gastroenterology;  Laterality: N/A;   HERNIA REPAIR     umbilical/ Dr Archie Patten HERNIA REPAIR Right 02/04/2019   Procedure: HERNIA REPAIR INGUINAL WITH MESH;  Surgeon: Olean Ree, MD;  Location: [facility];  Service: General;  Laterality: Right;   IR ANGIOGRAM VISCERAL SELECTIVE  10/29/2018   IR EMBO ART  VEN HEMORR LYMPH EXTRAV  INC GUIDE ROADMAPPING  10/29/2018   IR THORACENTESIS ASP PLEURAL SPACE W/IMG GUIDE  11/23/2018   IR US GUIDE VASC  ACCESS RIGHT  10/29/2018   KNEE ARTHROSCOPY Bilateral    POLYPECTOMY  08/22/2017   Procedure: POLYPECTOMY;  Surgeon: [name], MD;  Location: [place];  Service: Gastroenterology;;   RIGHT/LEFT HEART CATH AND CORONARY ANGIOGRAPHY N/A 06/13/2017   Procedure: Right/Left Heart Cath and Coronary Angiography;  Surgeon: [name], MD;  Location: [facility];  Service: Cardiovascular;  Laterality: N/A;   TONSILLECTOMY     Family History  Problem Relation Age of Onset   Alzheimer's disease Mother    Pulmonary embolism Father    Diabetes Father    Heart attack Father 32   Diabetes Brother    Heart disease Brother        CABG   Heart disease Brother        CABG   Diabetes Brother    Healthy Brother    Social History   Tobacco Use   Smoking status: Former    Packs/day: 1.50    Years: 20.00    Pack years: 30.00    Types: Cigarettes    Quit date: 1987    Years since quitting:  35.7   Smokeless tobacco: Former    Types: Chew    Quit date: 2000   Tobacco comments:    smoking cessation materials not required  Scientific laboratory technician Use: Never used  Substance Use Topics   Alcohol use: No    Alcohol/week: 0.0 standard drinks   Drug use: No    Pertinent Clinical Results:  LABS: Labs reviewed: Acceptable for surgery.  No visits with results within 3 Day(s) from this visit.  Latest known visit with results is:  Hospital Outpatient Visit on 09/10/2021  Component Date Value Ref Range Status   CRP 09/10/2021 0.6  <1.0 mg/dL Final   Performed at [facility] [address], [place], [place] [address]   Sed Rate 09/10/2021 6  0 - 20 mm/hr Final   Performed at [facility], [address] [place], [place] [address]   MRSA, PCR 09/10/2021 NEGATIVE  NEGATIVE Final   Staphylococcus aureus 09/10/2021 NEGATIVE  NEGATIVE Final   Comment: (NOTE) The Xpert SA Assay (FDA approved for NASAL specimens in patients 68 years of age and older), is one component of a comprehensive surveillance program. It is not intended to diagnose infection nor to guide or monitor treatment. Performed at [facility], [address] [place],  [address]    WBC 09/10/2021 6.3  4.0 - 10.5 K/uL Final   RBC 09/10/2021 4.30  4.22 - 5.81 MIL/uL Final   Hemoglobin 09/10/2021 13.8  13.0 - 17.0 g/dL Final   HCT 09/10/2021 40.6  39.0 - 52.0 % Final   MCV 09/10/2021 94.4  80.0 - 100.0 fL Final   MCH 09/10/2021 32.1  26.0 - 34.0 pg Final   MCHC 09/10/2021 34.0  30.0 - 36.0 g/dL Final   RDW 09/10/2021 13.4  11.5 - 15.5 % Final   Platelets 09/10/2021 176  150 - 400 K/uL Final   nRBC 09/10/2021 0.0  0.0 - 0.2 % Final   Performed at [facility], [address] [place], [place] [address]   Sodium 09/10/2021 138  135 - 145 mmol/L Final   Potassium 09/10/2021 4.1  3.5 - 5.1 mmol/L Final   Chloride 09/10/2021 102  98 - 111 mmol/L Final   CO2 09/10/2021 31  22 -  32 mmol/L Final   Glucose, Bld 09/10/2021 98  70 - 99 mg/dL  Final   Glucose reference range applies only to samples taken after fasting for at least 8 hours.   BUN 09/10/2021 22  8 - 23 mg/dL Final   Creatinine, Ser 09/10/2021 1.28 (A) 0.61 - 1.24 mg/dL Final   Calcium 09/10/2021 9.0  8.9 - 10.3 mg/dL Final   Total Protein 09/10/2021 7.1  6.5 - 8.1 g/dL Final   Albumin 09/10/2021 4.2  3.5 - 5.0 g/dL Final   AST 09/10/2021 25  15 - 41 U/L Final   ALT 09/10/2021 19  0 - 44 U/L Final   Alkaline Phosphatase 09/10/2021 62  38 - 126 U/L Final   Total Bilirubin 09/10/2021 1.7 (A) 0.3 - 1.2 mg/dL Final   GFR, Estimated 09/10/2021 56 (A) >60 mL/min Final   Comment: (NOTE) Calculated using the CKD-EPI Creatinine Equation (2021)    Anion gap 09/10/2021 5  5 - 15 Final   Performed at [facility], [address] [place], [place] [address]   Prothrombin Time 09/10/2021 13.3  11.4 - 15.2 seconds Final   INR 09/10/2021 1.0  0.8 - 1.2 Final   Comment: (NOTE) INR goal varies based on device and disease states. Performed at [facility], [address] [place], [place] [address]    aPTT 09/10/2021 38 (A) 24 - 36 seconds Final   Comment:        IF BASELINE aPTT IS ELEVATED, SUGGEST PATIENT RISK ASSESSMENT BE USED TO DETERMINE APPROPRIATE ANTICOAGULANT THERAPY. Performed at [facility], [address] [place], [place] [address]    Color, Urine 09/10/2021 YELLOW (A) YELLOW Final   APPearance 09/10/2021 CLEAR (A) CLEAR Final   Specific Gravity, Urine 09/10/2021 1.008  1.005 - 1.030 Final   pH 09/10/2021 7.0  5.0 - 8.0 Final   Glucose, UA 09/10/2021 NEGATIVE  NEGATIVE mg/dL Final   Hgb urine dipstick 09/10/2021 NEGATIVE  NEGATIVE Final   Bilirubin Urine 09/10/2021 NEGATIVE  NEGATIVE Final   Ketones, ur 09/10/2021 NEGATIVE  NEGATIVE mg/dL Final   Protein, ur 09/10/2021 NEGATIVE  NEGATIVE mg/dL Final   Nitrite 09/10/2021 NEGATIVE  NEGATIVE Final   Leukocytes,Ua  09/10/2021 NEGATIVE  NEGATIVE Final   Performed at [facility], [address] [place], [place] [address]   ABO/RH(D) 09/10/2021 AB POS   Final   Antibody Screen 09/10/2021 NEG   Final   Sample Expiration 09/10/2021 09/24/2021,2359   Final   Extend sample reason 09/10/2021    Final                   Value:NO TRANSFUSIONS OR PREGNANCY IN THE PAST 3 MONTHS Performed at [facility], [address] [place], [place] [address]     ECG: Date: 07/18/2021 Time ECG obtained: 0816 AM Rate: 53 bpm Rhythm: sinus bradycardia; RBBB Axis (leads I and aVF): Left axis deviation Intervals: PR 204 ms. QRS 140 ms. QTc 472 ms. ST segment and T wave changes: No evidence of acute ST segment elevation or depression Comparison: Similar to previous tracing obtained on 01/24/2021   IMAGING / PROCEDURES: TRANSTHORACIC ECHOCARDIOGRAM performed on 10/25/2020 Left ventricular ejection fraction, by estimation, is 45 to 50%. The left ventricle has mildly decreased function. The left ventricle  demonstrates global hypokinesis. There is mild left ventricular hypertrophy. Left ventricular diastolic parameters are indeterminate. Right ventricular systolic function is normal. The right ventricular size is normal.  Right atrial size was mildly dilated.  The mitral valve is normal in structure. Mild mitral valve regurgitation.  The  aortic valve is tricuspid. Aortic valve regurgitation is mild.  Aortic dilatation noted. There is mild dilatation of the aortic root, measuring 40 mm. There is moderate dilatation of the ascending aorta,  measuring 44 mm.   LONG TERM CARDIAC EVENT MONITOR STUDY performed on 11/19/2018 The patient was monitored for 13 days, 23 hours. The predominant rhythm was sinus with an average rate of 75 bpm (range 48 to 202 bpm). Patient with PACs and PVCs occurred. Frequent irregularly irregular rhythm was observed. Several strips are suspicious for atrial fibrillation, though  multifocal atrial tachycardia cannot be excluded. The longest episode lasted 40 minutes, 45 seconds. Atrial fibrillation burden ~30%. No sustained ventricular arrhythmia or prolonged pause was identified. Patient triggered event corresponds to atrial fibrillation  RIGHT/LEFT HEART CATHETERIZATION AND CORONARY ANGIOGRAPHY performed on 06/13/2017 Mild to moderate, nonobstructive coronary artery disease involving the RCA. Mid LAD myocardial bridging; otherwise no significant CAD involving the left coronary artery. Mildly elevated left heart filling pressure.  LVEDP 20-23 mmHg Moderately elevated right heart filling pressure. RA (mean): 14 mmHg RV (S/EDP): 40/15 mmHg Mild pulmonary hypertension PA (S/D, mean): 40/20 (27) mmHg PCWP (mean): 20 mmHg Normal Fick cardiac output Fick CO: 6.6 L/min Fick CI: 2.9 L/min/m  Impression and Plan:  Anthony Mosley has been referred for pre-anesthesia review and clearance prior to him undergoing the planned anesthetic and procedural courses. Available labs, pertinent testing, and imaging results were personally reviewed by me. This patient has been appropriately cleared by cardiology with an overall ACCEPTABLE risk of significant perioperative cardiovascular complications.  Based on clinical review performed today (09/13/21), barring any significant acute changes in the patient's overall condition, it is anticipated that he will be able to proceed with the planned surgical intervention. Any acute changes in clinical condition may necessitate his procedure being postponed and/or cancelled. Patient will meet with anesthesia team (MD and/or CRNA) on the day of his procedure for preoperative evaluation/assessment. Questions regarding anesthetic course will be fielded at that time.   Pre-surgical instructions were reviewed with the patient during his PAT appointment and questions were fielded by PAT clinical staff. Patient was advised that if any questions or  concerns arise prior to his procedure then he should return a call to PAT and/or his surgeon's office to discuss.  Honor Loh, MSN, APRN, FNP-C, CEN [facility]  Peri-operative Services Nurse Practitioner Phone: [phone] Fax: [phone] 09/13/21 8:20 AM  NOTE: This note has been prepared using Dragon dictation software. Despite my best ability to proofread, there is always the potential that unintentional transcriptional errors may still occur from this process.

## 2021-09-12 NOTE — Telephone Encounter (Signed)
   Name: Anthony Mosley  DOB: March 16, 1935  MRN: [id number]   Primary Cardiologist: [name], MD  Chart reviewed as part of pre-operative protocol coverage. Patient was contacted 09/12/2021 in reference to pre-operative risk assessment for pending surgery as outlined below.  Anthony Mosley was last seen on 07/18/21 by Dr. Saunders Revel.  Since that day, Anthony Mosley has done fine from a cardiac standpoint. He is able to complete 4 METs without anginal complaints.  Therefore, based on ACC/AHA guidelines, the patient would be at acceptable risk for the planned procedure without further cardiovascular testing.   The patient was advised that if he develops new symptoms prior to surgery to contact our office to arrange for a follow-up visit, and he verbalized understanding.  As below, pharmacy recommends a 3 day hold of his eliquis with plans to restart when cleared to do so by his surgeon.   I will route this recommendation to the requesting party via Epic fax function and remove from pre-op pool. Please call with questions.  Abigail Butts, PA-C 09/12/2021, 5:27 PM

## 2021-09-12 NOTE — Telephone Encounter (Signed)
    Patient Name: Anthony Mosley  DOB: 1935/01/06 MRN: [id number]  Primary Cardiologist: [name], MD  Chart reviewed as part of pre-operative protocol coverage.   Per pharmacy recommendations, patient can hold Eliquis 3 days prior to his upcoming knee replacement with plans to restart as soon as he is cleared to do so by his surgeon.  I will route this recommendation to the requesting party via Epic fax function and remove from pre-op pool.  Please call with questions.  Abigail Butts, PA-C 09/12/2021, 2:24 PM

## 2021-09-16 NOTE — H&P (Signed)
ORTHOPAEDIC HISTORY & PHYSICAL Regino Bellow, PA - 09/12/2021 9:00 AM EDT Formatting of this note is different from the original. Images from the original note were not included. Chief Complaint Chief Complaint  Patient presents with   Knee Pain  H & P RIGHT KNEE   Reason for Visit Anthony Mosley is a 86 y.o. who presents today for history and physical. He is to undergo a right total knee arthroplasty on 09/19/2021. Was last seen in the clinic on 04/19/2021. There have been no change in his condition since that time.  He has a long history of bilateral knee pain but the right knee is more symptomatic.  He localizes most of the pain along the lateral aspect of the right knee. He reports some swelling, no locking, and some giving way of the knee. The pain is aggravated by prolonged work in his garden or yard. The knee pain limits the patient's ability to ambulate long distances. The patient has not appreciated any significant improvement despite topical NSAIDs, oral NSAIDs, and activity modification. Previous intraarticular corticosteroid injections have provided temporary relief of the knee symptoms. He is not using any ambulatory aids. The patient states that the knee pain has progressed to the point that it is significantly interfering with his activities of daily living.   Of note, he continues to have issues with peripheral neuropathy. He has been taking gabapentin with only marginal improvement.  Past Medical History Past Medical History:  Diagnosis Date   A-fib (CMS-HCC)  eliquis   BPH (benign prostatic hyperplasia)   CAD (coronary artery disease)   Congestive heart failure (CHF) (CMS-HCC)   CRI (chronic renal insufficiency)   DJD (degenerative joint disease)  right knee   Dyslipidemia   Erectile dysfunction   Hemorrhoid   Hyperglycemia   Hyperlipidemia   Keratosis   Long Q-T syndrome   Varicocele   Past Surgical History Past Surgical History:  Procedure  Laterality Date   APPENDECTOMY   HERNIA REPAIR  inguinal hernia with mesh   KNEE ARTHROSCOPY  Bilateral knee arthroscopy   TONSILLECTOMY   Past Family History Family History  Problem Relation Age of Onset   Alzheimer's disease Mother   Pulmonary embolism Father   Diabetes type II Father   Diabetes type II Brother   Medications Current Outpatient Medications Ordered in Epic  Medication Sig Dispense Refill   apixaban (ELIQUIS) 2.5 mg tablet Take 2.5 mg by mouth 2 (two) times daily   atorvastatin (LIPITOR) 80 MG tablet Take 80 mg by mouth once daily   carboxymethylcellulose-glycerin (REFRESH OPTIVE) 0.5-0.9 % ophthalmic solution Place 1 drop into both eyes as needed   cholecalciferol (VITAMIN D3) 1000 unit tablet Take 1,000 Units by mouth once daily   collagen-biotin-ascorbic acid (COLLAGEN 1500 PLUS C) 500 mg-800 mcg- 50 mg Cap Take 1 capsule by mouth once daily Vital Proteins Collagen Peptides- 20g collagen per serving   cyanocobalamin, vitamin B-12, 2,500 mcg Tab Take 1 tablet by mouth once daily   diclofenac (VOLTAREN) 1 % topical gel Apply 2 g topically 2 (two) times daily as needed   fluticasone propionate (FLONASE) 50 mcg/actuation nasal spray Place 1 spray into both nostrils 2 (two) times daily as needed for Rhinitis   FUROsemide (LASIX) 20 MG tablet Take 20 mg by mouth once daily   guaiFENesin (MUCINEX) 1,200 mg Ta12 Take 1 tablet by mouth once daily   potassium gluconate 2.5 mEq Tab Take 1 tablet by mouth once daily   VITAMIN B COMPLEX (  B COMPLEX VITAMINS INJ) Take 1 tablet by mouth once daily   gabapentin (NEURONTIN) 300 MG capsule Take 1 capsule (300 mg total) by mouth nightly (Patient not taking: Reported on 09/12/2021) 30 capsule 3   hydrocortisone 2.5 % cream Apply 1 Application topically 2 (two) times daily as needed (Patient not taking: Reported on 09/12/2021)   No current Epic-ordered facility-administered medications on file.   Allergies Allergies  Allergen  Reactions   Amlodipine Other (See Comments)  Bradycardia   Macrolide Antibiotics Other (See Comments)  Prolonged Q-T syndrome   Adhesive Tape-Silicones Rash  Surgical Tape after appendectomy   Dutasteride Itching    Review of Systems A comprehensive 14 point ROS was performed, reviewed, and the pertinent orthopaedic findings are documented in the HPI.  Exam BP 110/70 (BP Location: Left upper arm, Patient Position: Sitting, BP Cuff Size: Adult)  Ht 185.4 cm (6\' 1" )  Wt 90.1 kg (198 lb 9.6 oz)  BMI 26.20 kg/m   General: Well-developed well-nourished male seen in no acute distress.   HEENT: Atraumatic,normocephalic. Pupils are equal and reactive to light. Oropharynx is clear with moist mucosa  Lungs: Clear to auscultation bilaterally   Cardiovascular: Regular rate and rhythm. Normal S1, S2. No murmurs. No appreciable gallops or rubs. Peripheral pulses are palpable.  Abdomen: Soft, non-tender, nondistended. Bowel sounds present  Extremity: Right Knee: Soft tissue swelling: mild Effusion: trace Erythema: none Crepitance: mild Tenderness: lateral Alignment: relative valgus Mediolateral laxity: lateral pseudolaxity Posterior sag: negative Patellar tracking: Good tracking without evidence of subluxation or tilt Atrophy: No significant atrophy.  Quadriceps tone was fair to good. Range of motion: 0/12/112 degrees  Neurological:  The patient is alert and oriented Sensation to light touch appears to be intact and within normal limits Gross motor strength appeared to be equal to 5/5  Vascular :  Peripheral pulses felt to be palpable. Capillary refill appears to be intact and within normal limits  X-ray  1. AP standing, lateral sunrise view of the right knee ordered and interpreted on today's visit shows narrowing of the lateral compartment space with bone-on-bone articulation being associated with valgus alignment. Was noted to have osteophytes as well as subchondral  sclerosis noted. Patella appears to be tracking well.  Impression  1. Degenerative arthrosis right knee  Plan   1. Patient is to discontinue his Eliquis 3 days prior to surgery 2. Return to clinic 2 weeks postop 3. Overnight stay in the hospital 4. Did discuss postop rehab course  This note was generated in part with voice recognition software and I apologize for any typographical errors that were not detected and corrected   Watt Climes PA Electronically signed by Regino Bellow, PA at 09/12/2021 10:05 AM EDT

## 2021-09-17 ENCOUNTER — Other Ambulatory Visit
Admission: RE | Admit: 2021-09-17 | Discharge: 2021-09-17 | Disposition: A | Discharge status: Home / Self Care | Payer: Medicare HMO | Source: Ambulatory Visit | Attending: Orthopedic Surgery | Admitting: Orthopedic Surgery

## 2021-09-17 ENCOUNTER — Other Ambulatory Visit: Payer: Self-pay

## 2021-09-17 DIAGNOSIS — Z87891 Personal history of nicotine dependence: Secondary | ICD-10-CM | POA: Diagnosis not present

## 2021-09-17 DIAGNOSIS — Z8249 Family history of ischemic heart disease and other diseases of the circulatory system: Secondary | ICD-10-CM | POA: Diagnosis not present

## 2021-09-17 DIAGNOSIS — E785 Hyperlipidemia, unspecified: Secondary | ICD-10-CM | POA: Diagnosis present

## 2021-09-17 DIAGNOSIS — M1711 Unilateral primary osteoarthritis, right knee: Secondary | ICD-10-CM | POA: Diagnosis present

## 2021-09-17 DIAGNOSIS — I2510 Atherosclerotic heart disease of native coronary artery without angina pectoris: Secondary | ICD-10-CM | POA: Diagnosis present

## 2021-09-17 DIAGNOSIS — Z20822 Contact with and (suspected) exposure to covid-19: Secondary | ICD-10-CM | POA: Insufficient documentation

## 2021-09-17 DIAGNOSIS — Z01812 Encounter for preprocedural laboratory examination: Secondary | ICD-10-CM | POA: Insufficient documentation

## 2021-09-17 DIAGNOSIS — N400 Enlarged prostate without lower urinary tract symptoms: Secondary | ICD-10-CM | POA: Diagnosis present

## 2021-09-17 DIAGNOSIS — Z79899 Other long term (current) drug therapy: Secondary | ICD-10-CM | POA: Diagnosis not present

## 2021-09-17 DIAGNOSIS — M25561 Pain in right knee: Secondary | ICD-10-CM | POA: Diagnosis present

## 2021-09-17 DIAGNOSIS — N183 Chronic kidney disease, stage 3 unspecified: Secondary | ICD-10-CM | POA: Diagnosis present

## 2021-09-17 DIAGNOSIS — I480 Paroxysmal atrial fibrillation: Secondary | ICD-10-CM | POA: Diagnosis present

## 2021-09-17 DIAGNOSIS — L719 Rosacea, unspecified: Secondary | ICD-10-CM | POA: Diagnosis present

## 2021-09-17 DIAGNOSIS — G8929 Other chronic pain: Secondary | ICD-10-CM | POA: Diagnosis present

## 2021-09-17 DIAGNOSIS — Z7901 Long term (current) use of anticoagulants: Secondary | ICD-10-CM | POA: Diagnosis not present

## 2021-09-17 DIAGNOSIS — I340 Nonrheumatic mitral (valve) insufficiency: Secondary | ICD-10-CM | POA: Diagnosis present

## 2021-09-17 DIAGNOSIS — E538 Deficiency of other specified B group vitamins: Secondary | ICD-10-CM | POA: Diagnosis present

## 2021-09-17 DIAGNOSIS — I5022 Chronic systolic (congestive) heart failure: Secondary | ICD-10-CM | POA: Diagnosis present

## 2021-09-17 DIAGNOSIS — M25562 Pain in left knee: Secondary | ICD-10-CM | POA: Diagnosis present

## 2021-09-17 DIAGNOSIS — I428 Other cardiomyopathies: Secondary | ICD-10-CM | POA: Diagnosis present

## 2021-09-17 DIAGNOSIS — I4510 Unspecified right bundle-branch block: Secondary | ICD-10-CM | POA: Diagnosis present

## 2021-09-17 DIAGNOSIS — I272 Pulmonary hypertension, unspecified: Secondary | ICD-10-CM | POA: Diagnosis present

## 2021-09-17 DIAGNOSIS — Z85828 Personal history of other malignant neoplasm of skin: Secondary | ICD-10-CM | POA: Diagnosis not present

## 2021-09-17 DIAGNOSIS — K219 Gastro-esophageal reflux disease without esophagitis: Secondary | ICD-10-CM | POA: Diagnosis present

## 2021-09-17 DIAGNOSIS — G629 Polyneuropathy, unspecified: Secondary | ICD-10-CM | POA: Diagnosis present

## 2021-09-17 DIAGNOSIS — Z96651 Presence of right artificial knee joint: Secondary | ICD-10-CM | POA: Diagnosis not present

## 2021-09-17 LAB — SARS CORONAVIRUS 2 (TAT 6-24 HRS): SARS Coronavirus 2: NEGATIVE

## 2021-09-19 ENCOUNTER — Encounter
Admission: RE | Disposition: A | Discharge status: Home / Self Care | Payer: Self-pay | Source: Ambulatory Visit | Attending: Orthopedic Surgery

## 2021-09-19 ENCOUNTER — Encounter: Payer: Self-pay | Admitting: Orthopedic Surgery

## 2021-09-19 ENCOUNTER — Inpatient Hospital Stay: Payer: Medicare HMO | Admitting: Urgent Care

## 2021-09-19 ENCOUNTER — Inpatient Hospital Stay
Admission: RE | Admit: 2021-09-19 | Discharge: 2021-09-20 | DRG: 470 | Disposition: A | Discharge status: Home Health | Payer: Medicare HMO | Attending: Orthopedic Surgery | Admitting: Orthopedic Surgery

## 2021-09-19 ENCOUNTER — Inpatient Hospital Stay: Payer: Medicare HMO

## 2021-09-19 DIAGNOSIS — I480 Paroxysmal atrial fibrillation: Secondary | ICD-10-CM | POA: Diagnosis present

## 2021-09-19 DIAGNOSIS — G8929 Other chronic pain: Secondary | ICD-10-CM | POA: Diagnosis present

## 2021-09-19 DIAGNOSIS — M25561 Pain in right knee: Secondary | ICD-10-CM | POA: Diagnosis present

## 2021-09-19 DIAGNOSIS — N183 Chronic kidney disease, stage 3 unspecified: Secondary | ICD-10-CM | POA: Diagnosis present

## 2021-09-19 DIAGNOSIS — I272 Pulmonary hypertension, unspecified: Secondary | ICD-10-CM | POA: Diagnosis present

## 2021-09-19 DIAGNOSIS — N400 Enlarged prostate without lower urinary tract symptoms: Secondary | ICD-10-CM | POA: Diagnosis present

## 2021-09-19 DIAGNOSIS — Z96659 Presence of unspecified artificial knee joint: Secondary | ICD-10-CM

## 2021-09-19 DIAGNOSIS — G629 Polyneuropathy, unspecified: Secondary | ICD-10-CM | POA: Diagnosis present

## 2021-09-19 DIAGNOSIS — Z8249 Family history of ischemic heart disease and other diseases of the circulatory system: Secondary | ICD-10-CM

## 2021-09-19 DIAGNOSIS — I5022 Chronic systolic (congestive) heart failure: Secondary | ICD-10-CM | POA: Diagnosis present

## 2021-09-19 DIAGNOSIS — I2510 Atherosclerotic heart disease of native coronary artery without angina pectoris: Secondary | ICD-10-CM | POA: Diagnosis present

## 2021-09-19 DIAGNOSIS — E785 Hyperlipidemia, unspecified: Secondary | ICD-10-CM | POA: Diagnosis present

## 2021-09-19 DIAGNOSIS — L719 Rosacea, unspecified: Secondary | ICD-10-CM | POA: Diagnosis present

## 2021-09-19 DIAGNOSIS — I340 Nonrheumatic mitral (valve) insufficiency: Secondary | ICD-10-CM | POA: Diagnosis present

## 2021-09-19 DIAGNOSIS — E538 Deficiency of other specified B group vitamins: Secondary | ICD-10-CM | POA: Diagnosis present

## 2021-09-19 DIAGNOSIS — Z20822 Contact with and (suspected) exposure to covid-19: Secondary | ICD-10-CM | POA: Diagnosis present

## 2021-09-19 DIAGNOSIS — Z85828 Personal history of other malignant neoplasm of skin: Secondary | ICD-10-CM

## 2021-09-19 DIAGNOSIS — Z87891 Personal history of nicotine dependence: Secondary | ICD-10-CM | POA: Diagnosis not present

## 2021-09-19 DIAGNOSIS — I4510 Unspecified right bundle-branch block: Secondary | ICD-10-CM | POA: Diagnosis present

## 2021-09-19 DIAGNOSIS — M1711 Unilateral primary osteoarthritis, right knee: Secondary | ICD-10-CM | POA: Diagnosis present

## 2021-09-19 DIAGNOSIS — Z96651 Presence of right artificial knee joint: Secondary | ICD-10-CM | POA: Diagnosis not present

## 2021-09-19 DIAGNOSIS — Z7901 Long term (current) use of anticoagulants: Secondary | ICD-10-CM | POA: Diagnosis not present

## 2021-09-19 DIAGNOSIS — Z79899 Other long term (current) drug therapy: Secondary | ICD-10-CM | POA: Diagnosis not present

## 2021-09-19 DIAGNOSIS — I428 Other cardiomyopathies: Secondary | ICD-10-CM | POA: Diagnosis present

## 2021-09-19 DIAGNOSIS — K219 Gastro-esophageal reflux disease without esophagitis: Secondary | ICD-10-CM | POA: Diagnosis present

## 2021-09-19 DIAGNOSIS — M25562 Pain in left knee: Secondary | ICD-10-CM | POA: Diagnosis present

## 2021-09-19 HISTORY — DX: Unspecified malignant neoplasm of skin, unspecified: C44.90

## 2021-09-19 HISTORY — DX: Benign prostatic hyperplasia without lower urinary tract symptoms: N40.0

## 2021-09-19 HISTORY — DX: Thoracic aortic ectasia: I77.810

## 2021-09-19 HISTORY — DX: Long QT syndrome: I45.81

## 2021-09-19 HISTORY — DX: Male erectile dysfunction, unspecified: N52.9

## 2021-09-19 HISTORY — DX: Unspecified systolic (congestive) heart failure: I50.20

## 2021-09-19 HISTORY — DX: Localized edema: R60.0

## 2021-09-19 HISTORY — DX: Scrotal varices: I86.1

## 2021-09-19 HISTORY — DX: Deficiency of other specified B group vitamins: E53.8

## 2021-09-19 HISTORY — PX: KNEE ARTHROPLASTY: SHX992

## 2021-09-19 HISTORY — DX: Unspecified osteoarthritis, unspecified site: M19.90

## 2021-09-19 HISTORY — DX: Atherosclerosis of aorta: I70.0

## 2021-09-19 HISTORY — DX: Chronic kidney disease, stage 3 unspecified: N18.30

## 2021-09-19 HISTORY — DX: Long term (current) use of anticoagulants: Z79.01

## 2021-09-19 SURGERY — ARTHROPLASTY, KNEE, TOTAL, USING IMAGELESS COMPUTER-ASSISTED NAVIGATION
Anesthesia: Spinal | Site: Knee | Laterality: Right

## 2021-09-19 MED ORDER — LACTATED RINGERS IV SOLN: INTRAVENOUS | Status: DC

## 2021-09-19 MED ORDER — CALCIUM CARBONATE ANTACID 500 MG PO CHEW: 1.0000 | CHEWABLE_TABLET | ORAL | Status: DC | PRN

## 2021-09-19 MED ORDER — SENNOSIDES-DOCUSATE SODIUM 8.6-50 MG PO TABS
1.0000 | ORAL_TABLET | Freq: Two times a day (BID) | ORAL | Status: DC
  Administered 2021-09-19 – 2021-09-20 (×2): 1 via ORAL
  Filled 2021-09-19 (×2): qty 1

## 2021-09-19 MED ORDER — PHENYLEPHRINE HCL (PRESSORS) 10 MG/ML IV SOLN
INTRAVENOUS | Status: AC
  Filled 2021-09-19: qty 1

## 2021-09-19 MED ORDER — BUPIVACAINE HCL (PF) 0.5 % IJ SOLN
INTRAMUSCULAR | Status: DC | PRN
  Administered 2021-09-19: 3 mL

## 2021-09-19 MED ORDER — DEXMEDETOMIDINE (PRECEDEX) IN NS 20 MCG/5ML (4 MCG/ML) IV SYRINGE
PREFILLED_SYRINGE | INTRAVENOUS | Status: DC | PRN
  Administered 2021-09-19 (×2): 8 ug via INTRAVENOUS

## 2021-09-19 MED ORDER — SODIUM CHLORIDE FLUSH 0.9 % IV SOLN
INTRAVENOUS | Status: AC
  Filled 2021-09-19: qty 80

## 2021-09-19 MED ORDER — ACETAMINOPHEN 10 MG/ML IV SOLN
INTRAVENOUS | Status: DC | PRN
  Administered 2021-09-19: 1000 mg via INTRAVENOUS

## 2021-09-19 MED ORDER — DEXAMETHASONE SODIUM PHOSPHATE 10 MG/ML IJ SOLN
INTRAMUSCULAR | Status: AC
  Administered 2021-09-19: 8 mg via INTRAVENOUS
  Filled 2021-09-19: qty 1

## 2021-09-19 MED ORDER — FERROUS SULFATE 325 (65 FE) MG PO TABS
325.0000 mg | ORAL_TABLET | Freq: Two times a day (BID) | ORAL | Status: DC
  Administered 2021-09-19 – 2021-09-20 (×3): 325 mg via ORAL
  Filled 2021-09-19 (×3): qty 1

## 2021-09-19 MED ORDER — BUPIVACAINE HCL (PF) 0.25 % IJ SOLN
INTRAMUSCULAR | Status: AC
  Filled 2021-09-19: qty 120

## 2021-09-19 MED ORDER — FENTANYL CITRATE (PF) 100 MCG/2ML IJ SOLN: 25.0000 ug | INTRAMUSCULAR | Status: DC | PRN

## 2021-09-19 MED ORDER — ACETAMINOPHEN 10 MG/ML IV SOLN: 1000.0000 mg | Freq: Once | INTRAVENOUS | Status: DC | PRN

## 2021-09-19 MED ORDER — BISACODYL 10 MG RE SUPP
10.0000 mg | Freq: Every day | RECTAL | Status: DC | PRN
  Administered 2021-09-20: 10 mg via RECTAL
  Filled 2021-09-19: qty 1

## 2021-09-19 MED ORDER — SODIUM CHLORIDE 0.9 % IV SOLN
INTRAVENOUS | Status: DC | PRN
  Administered 2021-09-19: 60 mL

## 2021-09-19 MED ORDER — TRANEXAMIC ACID-NACL 1000-0.7 MG/100ML-% IV SOLN
INTRAVENOUS | Status: DC | PRN
  Administered 2021-09-19: 1000 mg via INTRAVENOUS

## 2021-09-19 MED ORDER — OXYCODONE HCL 5 MG PO TABS: 5.0000 mg | ORAL_TABLET | ORAL | Status: DC | PRN

## 2021-09-19 MED ORDER — TETRAHYDROZ-DEXTRAN-PEG-POVID 0.05-0.1-1-1 % OP SOLN
1.0000 [drp] | OPHTHALMIC | Status: DC | PRN
  Filled 2021-09-19: qty 1

## 2021-09-19 MED ORDER — CELECOXIB 200 MG PO CAPS
ORAL_CAPSULE | ORAL | Status: AC
  Administered 2021-09-19: 400 mg via ORAL
  Filled 2021-09-19: qty 2

## 2021-09-19 MED ORDER — SODIUM CHLORIDE 0.9 % IV SOLN: INTRAVENOUS | Status: DC

## 2021-09-19 MED ORDER — METOCLOPRAMIDE HCL 10 MG PO TABS
10.0000 mg | ORAL_TABLET | Freq: Three times a day (TID) | ORAL | Status: DC
  Administered 2021-09-19 – 2021-09-20 (×5): 10 mg via ORAL
  Filled 2021-09-19 (×5): qty 1

## 2021-09-19 MED ORDER — FENTANYL CITRATE (PF) 100 MCG/2ML IJ SOLN
INTRAMUSCULAR | Status: AC
  Filled 2021-09-19: qty 2

## 2021-09-19 MED ORDER — TRAMADOL HCL 50 MG PO TABS
50.0000 mg | ORAL_TABLET | ORAL | Status: DC | PRN
  Administered 2021-09-20: 100 mg via ORAL
  Filled 2021-09-19: qty 2

## 2021-09-19 MED ORDER — TRANEXAMIC ACID-NACL 1000-0.7 MG/100ML-% IV SOLN
INTRAVENOUS | Status: AC
  Filled 2021-09-19: qty 100

## 2021-09-19 MED ORDER — POLYVINYL ALCOHOL 1.4 % OP SOLN
1.0000 [drp] | OPHTHALMIC | Status: DC | PRN
  Filled 2021-09-19: qty 15

## 2021-09-19 MED ORDER — EPHEDRINE SULFATE 50 MG/ML IJ SOLN
INTRAMUSCULAR | Status: DC | PRN
  Administered 2021-09-19: 10 mg via INTRAVENOUS

## 2021-09-19 MED ORDER — OXYCODONE HCL 5 MG PO TABS
10.0000 mg | ORAL_TABLET | ORAL | Status: DC | PRN
Start: 2021-09-19 — End: 2021-09-20
  Filled 2021-09-19: qty 2

## 2021-09-19 MED ORDER — ATORVASTATIN CALCIUM 20 MG PO TABS
80.0000 mg | ORAL_TABLET | Freq: Every evening | ORAL | Status: DC
  Administered 2021-09-19: 80 mg via ORAL
  Filled 2021-09-19: qty 4

## 2021-09-19 MED ORDER — FENTANYL CITRATE (PF) 100 MCG/2ML IJ SOLN
INTRAMUSCULAR | Status: AC
  Administered 2021-09-19: 50 ug via INTRAVENOUS
  Filled 2021-09-19: qty 2

## 2021-09-19 MED ORDER — ACETAMINOPHEN 10 MG/ML IV SOLN
1000.0000 mg | Freq: Four times a day (QID) | INTRAVENOUS | Status: AC
  Administered 2021-09-19 – 2021-09-20 (×4): 1000 mg via INTRAVENOUS
  Filled 2021-09-19 (×4): qty 100

## 2021-09-19 MED ORDER — VITAMIN D 25 MCG (1000 UNIT) PO TABS
1000.0000 [IU] | ORAL_TABLET | Freq: Every day | ORAL | Status: DC
  Administered 2021-09-20: 1000 [IU] via ORAL
  Filled 2021-09-19: qty 1

## 2021-09-19 MED ORDER — ACETAMINOPHEN 10 MG/ML IV SOLN
INTRAVENOUS | Status: AC
  Filled 2021-09-19: qty 100

## 2021-09-19 MED ORDER — TRANEXAMIC ACID-NACL 1000-0.7 MG/100ML-% IV SOLN: 1000.0000 mg | Freq: Once | INTRAVENOUS | Status: AC

## 2021-09-19 MED ORDER — SURGIPHOR WOUND IRRIGATION SYSTEM - OPTIME
TOPICAL | Status: DC | PRN
  Administered 2021-09-19: 400 mL via TOPICAL

## 2021-09-19 MED ORDER — ORAL CARE MOUTH RINSE: 15.0000 mL | Freq: Once | OROMUCOSAL | Status: DC

## 2021-09-19 MED ORDER — DEXAMETHASONE SODIUM PHOSPHATE 10 MG/ML IJ SOLN: 8.0000 mg | Freq: Once | INTRAMUSCULAR | Status: DC

## 2021-09-19 MED ORDER — CEFAZOLIN SODIUM-DEXTROSE 2-3 GM-%(50ML) IV SOLR
INTRAVENOUS | Status: DC | PRN
  Administered 2021-09-19: 2 g via INTRAVENOUS

## 2021-09-19 MED ORDER — FAMOTIDINE 20 MG PO TABS: 20.0000 mg | ORAL_TABLET | Freq: Once | ORAL | Status: AC

## 2021-09-19 MED ORDER — TRANEXAMIC ACID-NACL 1000-0.7 MG/100ML-% IV SOLN
INTRAVENOUS | Status: AC
  Administered 2021-09-19: 1000 mg via INTRAVENOUS
  Filled 2021-09-19: qty 100

## 2021-09-19 MED ORDER — CELECOXIB 200 MG PO CAPS: 400.0000 mg | ORAL_CAPSULE | Freq: Once | ORAL | Status: DC

## 2021-09-19 MED ORDER — ONDANSETRON HCL 4 MG/2ML IJ SOLN: 4.0000 mg | Freq: Four times a day (QID) | INTRAMUSCULAR | Status: DC | PRN

## 2021-09-19 MED ORDER — CHLORHEXIDINE GLUCONATE 0.12 % MT SOLN: 15.0000 mL | Freq: Once | OROMUCOSAL | Status: DC

## 2021-09-19 MED ORDER — OXYCODONE HCL 5 MG/5ML PO SOLN: 5.0000 mg | Freq: Once | ORAL | Status: DC | PRN

## 2021-09-19 MED ORDER — PHENOL 1.4 % MT LIQD
1.0000 | OROMUCOSAL | Status: DC | PRN
  Filled 2021-09-19: qty 177

## 2021-09-19 MED ORDER — ONDANSETRON HCL 4 MG PO TABS: 4.0000 mg | ORAL_TABLET | Freq: Four times a day (QID) | ORAL | Status: DC | PRN

## 2021-09-19 MED ORDER — TRANEXAMIC ACID-NACL 1000-0.7 MG/100ML-% IV SOLN: 1000.0000 mg | INTRAVENOUS | Status: DC

## 2021-09-19 MED ORDER — SODIUM CHLORIDE 0.9 % IR SOLN
Status: DC | PRN
  Administered 2021-09-19: 3000 mL

## 2021-09-19 MED ORDER — GLYCOPYRROLATE 0.2 MG/ML IJ SOLN
INTRAMUSCULAR | Status: DC | PRN
  Administered 2021-09-19: .2 mg via INTRAVENOUS

## 2021-09-19 MED ORDER — CHLORHEXIDINE GLUCONATE 0.12 % MT SOLN
OROMUCOSAL | Status: AC
  Administered 2021-09-19: 15 mL via OROMUCOSAL
  Filled 2021-09-19: qty 15

## 2021-09-19 MED ORDER — CHLORHEXIDINE GLUCONATE 4 % EX LIQD: 60.0000 mL | Freq: Once | CUTANEOUS | Status: DC

## 2021-09-19 MED ORDER — MIDAZOLAM HCL 2 MG/2ML IJ SOLN
INTRAMUSCULAR | Status: AC
  Filled 2021-09-19: qty 2

## 2021-09-19 MED ORDER — PROPOFOL 1000 MG/100ML IV EMUL
INTRAVENOUS | Status: AC
  Filled 2021-09-19: qty 200

## 2021-09-19 MED ORDER — OXYCODONE HCL 5 MG PO TABS: 5.0000 mg | ORAL_TABLET | Freq: Once | ORAL | Status: DC | PRN

## 2021-09-19 MED ORDER — GABAPENTIN 300 MG PO CAPS
ORAL_CAPSULE | ORAL | Status: AC
  Administered 2021-09-19: 300 mg via ORAL
  Filled 2021-09-19: qty 1

## 2021-09-19 MED ORDER — FLUTICASONE PROPIONATE 50 MCG/ACT NA SUSP
1.0000 | Freq: Every day | NASAL | Status: DC | PRN
  Filled 2021-09-19: qty 16

## 2021-09-19 MED ORDER — ALUM & MAG HYDROXIDE-SIMETH 200-200-20 MG/5ML PO SUSP: 30.0000 mL | ORAL | Status: DC | PRN

## 2021-09-19 MED ORDER — ACETAMINOPHEN 325 MG PO TABS: 325.0000 mg | ORAL_TABLET | Freq: Four times a day (QID) | ORAL | Status: DC | PRN

## 2021-09-19 MED ORDER — DIPHENHYDRAMINE HCL 12.5 MG/5ML PO ELIX: 12.5000 mg | ORAL_SOLUTION | ORAL | Status: DC | PRN

## 2021-09-19 MED ORDER — CYANOCOBALAMIN 500 MCG PO TABS
2500.0000 ug | ORAL_TABLET | Freq: Every day | ORAL | Status: DC
  Administered 2021-09-20: 2500 ug via ORAL
  Filled 2021-09-19 (×2): qty 5

## 2021-09-19 MED ORDER — ENSURE PRE-SURGERY PO LIQD
296.0000 mL | Freq: Once | ORAL | Status: DC
  Filled 2021-09-19: qty 296

## 2021-09-19 MED ORDER — NEOMYCIN-POLYMYXIN B GU 40-200000 IR SOLN
Status: AC
  Filled 2021-09-19: qty 40

## 2021-09-19 MED ORDER — FUROSEMIDE 20 MG PO TABS
20.0000 mg | ORAL_TABLET | Freq: Every day | ORAL | Status: DC
  Administered 2021-09-20: 20 mg via ORAL
  Filled 2021-09-19: qty 1

## 2021-09-19 MED ORDER — 0.9 % SODIUM CHLORIDE (POUR BTL) OPTIME
TOPICAL | Status: DC | PRN
  Administered 2021-09-19: 50 mL

## 2021-09-19 MED ORDER — PANTOPRAZOLE SODIUM 40 MG PO TBEC
40.0000 mg | DELAYED_RELEASE_TABLET | Freq: Two times a day (BID) | ORAL | Status: DC
  Administered 2021-09-19 – 2021-09-20 (×2): 40 mg via ORAL
  Filled 2021-09-19 (×2): qty 1

## 2021-09-19 MED ORDER — FAMOTIDINE 20 MG PO TABS
ORAL_TABLET | ORAL | Status: AC
  Administered 2021-09-19: 20 mg via ORAL
  Filled 2021-09-19: qty 1

## 2021-09-19 MED ORDER — BUPIVACAINE HCL (PF) 0.25 % IJ SOLN
INTRAMUSCULAR | Status: DC | PRN
  Administered 2021-09-19: 60 mL

## 2021-09-19 MED ORDER — FLEET ENEMA 7-19 GM/118ML RE ENEM: 1.0000 | ENEMA | Freq: Once | RECTAL | Status: DC | PRN

## 2021-09-19 MED ORDER — SODIUM CHLORIDE 0.9 % IV SOLN
INTRAVENOUS | Status: DC | PRN
  Administered 2021-09-19: 25 ug/min via INTRAVENOUS

## 2021-09-19 MED ORDER — PROPOFOL 500 MG/50ML IV EMUL
INTRAVENOUS | Status: DC | PRN
  Administered 2021-09-19: 80 ug/kg/min via INTRAVENOUS

## 2021-09-19 MED ORDER — HYDROMORPHONE HCL 1 MG/ML IJ SOLN: 0.5000 mg | INTRAMUSCULAR | Status: DC | PRN

## 2021-09-19 MED ORDER — APIXABAN 2.5 MG PO TABS
2.5000 mg | ORAL_TABLET | Freq: Two times a day (BID) | ORAL | Status: DC
  Administered 2021-09-20: 2.5 mg via ORAL
  Filled 2021-09-19: qty 1

## 2021-09-19 MED ORDER — BUPIVACAINE LIPOSOME 1.3 % IJ SUSP
INTRAMUSCULAR | Status: AC
  Filled 2021-09-19: qty 40

## 2021-09-19 MED ORDER — FENTANYL CITRATE (PF) 100 MCG/2ML IJ SOLN
INTRAMUSCULAR | Status: DC | PRN
  Administered 2021-09-19 (×2): 50 ug via INTRAVENOUS

## 2021-09-19 MED ORDER — CEFAZOLIN SODIUM-DEXTROSE 2-4 GM/100ML-% IV SOLN: 2.0000 g | INTRAVENOUS | Status: DC

## 2021-09-19 MED ORDER — GABAPENTIN 300 MG PO CAPS: 300.0000 mg | ORAL_CAPSULE | Freq: Once | ORAL | Status: DC

## 2021-09-19 MED ORDER — POTASSIUM 99 MG PO TABS: 99.0000 mg | ORAL_TABLET | Freq: Every day | ORAL | Status: DC

## 2021-09-19 MED ORDER — MENTHOL 3 MG MT LOZG
1.0000 | LOZENGE | OROMUCOSAL | Status: DC | PRN
  Administered 2021-09-20: 3 mg via ORAL
  Filled 2021-09-19 (×2): qty 9

## 2021-09-19 MED ORDER — CEFAZOLIN SODIUM-DEXTROSE 2-4 GM/100ML-% IV SOLN
2.0000 g | Freq: Four times a day (QID) | INTRAVENOUS | Status: AC
Start: 2021-09-19 — End: 2021-09-19
  Administered 2021-09-19 (×2): 2 g via INTRAVENOUS
  Filled 2021-09-19 (×3): qty 100

## 2021-09-19 MED ORDER — MAGNESIUM HYDROXIDE 400 MG/5ML PO SUSP
30.0000 mL | Freq: Every day | ORAL | Status: DC
  Administered 2021-09-19 – 2021-09-20 (×2): 30 mL via ORAL
  Filled 2021-09-19 (×2): qty 30

## 2021-09-19 MED ORDER — CELECOXIB 200 MG PO CAPS
200.0000 mg | ORAL_CAPSULE | Freq: Two times a day (BID) | ORAL | Status: DC
  Administered 2021-09-19 – 2021-09-20 (×2): 200 mg via ORAL
  Filled 2021-09-19 (×2): qty 1

## 2021-09-19 MED ORDER — CEFAZOLIN SODIUM-DEXTROSE 2-4 GM/100ML-% IV SOLN
INTRAVENOUS | Status: AC
  Administered 2021-09-19: 2000 mg
  Filled 2021-09-19: qty 100

## 2021-09-19 SURGICAL SUPPLY — 78 items
ATTUNE MED DOME PAT 41 KNEE (Knees) ×1 IMPLANT
ATTUNE PS FEM RT SZ 8 CEM KNEE (Femur) ×1 IMPLANT
ATTUNE PSRP INSR SZ8 5 KNEE (Insert) ×1 IMPLANT
BASE TIBIAL ROT PLAT SZ 8 KNEE (Knees) IMPLANT
BATTERY INSTRU NAVIGATION (MISCELLANEOUS) ×8 IMPLANT
BLADE SAW 70X12.5 (BLADE) ×2 IMPLANT
BLADE SAW 90X13X1.19 OSCILLAT (BLADE) ×2 IMPLANT
BLADE SAW 90X25X1.19 OSCILLAT (BLADE) ×2 IMPLANT
BSPLAT TIB 8 CMNT ROT PLAT STR (Knees) ×1 IMPLANT
BTRY SRG DRVR LF (MISCELLANEOUS) ×4
CEMENT HV SMART SET (Cement) ×2 IMPLANT
COOLER POLAR GLACIER W/PUMP (MISCELLANEOUS) ×2 IMPLANT
CUFF TOURN SGL QUICK 24 (TOURNIQUET CUFF)
CUFF TOURN SGL QUICK 34 (TOURNIQUET CUFF)
CUFF TRNQT CYL 24X4X16.5-23 (TOURNIQUET CUFF) IMPLANT
CUFF TRNQT CYL 34X4.125X (TOURNIQUET CUFF) IMPLANT
DRAPE 3/4 80X56 (DRAPES) ×2 IMPLANT
DRAPE INCISE IOBAN 66X45 STRL (DRAPES) ×2 IMPLANT
DRSG DERMACEA 8X12 NADH (GAUZE/BANDAGES/DRESSINGS) ×2 IMPLANT
DRSG MEPILEX SACRM 8.7X9.8 (GAUZE/BANDAGES/DRESSINGS) ×2 IMPLANT
DRSG OPSITE POSTOP 4X14 (GAUZE/BANDAGES/DRESSINGS) ×2 IMPLANT
DRSG TEGADERM 4X4.75 (GAUZE/BANDAGES/DRESSINGS) ×2 IMPLANT
DURAPREP 26ML APPLICATOR (WOUND CARE) ×4 IMPLANT
ELECT CAUTERY BLADE 6.4 (BLADE) ×2 IMPLANT
ELECT REM PT RETURN 9FT ADLT (ELECTROSURGICAL) ×2
ELECTRODE REM PT RTRN 9FT ADLT (ELECTROSURGICAL) ×1 IMPLANT
EX-PIN ORTHOLOCK NAV 4X150 (PIN) ×4 IMPLANT
GLOVE SRG 8 PF TXTR STRL LF DI (GLOVE) ×1 IMPLANT
GLOVE SURG ENC TEXT LTX SZ7.5 (GLOVE) ×4 IMPLANT
GLOVE SURG UNDER POLY LF SZ7.5 (GLOVE) ×8 IMPLANT
GLOVE SURG UNDER POLY LF SZ8 (GLOVE) ×2
GOWN STRL REUS W/ TWL LRG LVL3 (GOWN DISPOSABLE) ×2 IMPLANT
GOWN STRL REUS W/ TWL XL LVL3 (GOWN DISPOSABLE) ×1 IMPLANT
GOWN STRL REUS W/TWL LRG LVL3 (GOWN DISPOSABLE) ×4
GOWN STRL REUS W/TWL XL LVL3 (GOWN DISPOSABLE) ×2
HANDLE YANKAUER SUCT BULB TIP (MISCELLANEOUS) ×1 IMPLANT
HEMOVAC 400CC 10FR (MISCELLANEOUS) ×2 IMPLANT
HOLDER FOLEY CATH W/STRAP (MISCELLANEOUS) ×2 IMPLANT
IRRIGATION SURGIPHOR STRL (IV SOLUTION) ×2 IMPLANT
IV NS IRRIG 3000ML ARTHROMATIC (IV SOLUTION) ×2 IMPLANT
KIT TURNOVER KIT A (KITS) ×2 IMPLANT
KNIFE SCULPS 14X20 (INSTRUMENTS) ×2 IMPLANT
LABEL OR SOLS (LABEL) ×2 IMPLANT
MANIFOLD NEPTUNE II (INSTRUMENTS) ×4 IMPLANT
NDL SAFETY ECLIPSE 18X1.5 (NEEDLE) ×1 IMPLANT
NDL SPNL 20GX3.5 QUINCKE YW (NEEDLE) ×2 IMPLANT
NEEDLE HYPO 18GX1.5 SHARP (NEEDLE) ×2
NEEDLE SPNL 20GX3.5 QUINCKE YW (NEEDLE) ×4 IMPLANT
NS IRRIG 500ML POUR BTL (IV SOLUTION) ×2 IMPLANT
PACK TOTAL KNEE (MISCELLANEOUS) ×2 IMPLANT
PAD ABD DERMACEA PRESS 5X9 (GAUZE/BANDAGES/DRESSINGS) ×4 IMPLANT
PAD WRAPON POLAR KNEE (MISCELLANEOUS) ×1 IMPLANT
PENCIL SMOKE EVACUATOR COATED (MISCELLANEOUS) ×2 IMPLANT
PIN DRILL FIX HALF THREAD (BIT) ×2 IMPLANT
PIN DRILL QUICK PACK ×3 IMPLANT
PIN FIXATION 1/8DIA X 3INL (PIN) ×2 IMPLANT
PULSAVAC PLUS IRRIG FAN TIP (DISPOSABLE) ×2
SOL PREP PVP 2OZ (MISCELLANEOUS) ×2
SOLUTION PREP PVP 2OZ (MISCELLANEOUS) ×1 IMPLANT
SPONGE DRAIN TRACH 4X4 STRL 2S (GAUZE/BANDAGES/DRESSINGS) ×2 IMPLANT
SPONGE T-LAP 18X18 ~~LOC~~+RFID (SPONGE) ×6 IMPLANT
STAPLER SKIN PROX 35W (STAPLE) ×2 IMPLANT
STOCKINETTE IMPERV 14X48 (MISCELLANEOUS) IMPLANT
STRAP TIBIA SHORT (MISCELLANEOUS) ×2 IMPLANT
SUCTION FRAZIER HANDLE 10FR (MISCELLANEOUS) ×2
SUCTION TUBE FRAZIER 10FR DISP (MISCELLANEOUS) ×1 IMPLANT
SUT VIC AB 0 CT1 36 (SUTURE) ×4 IMPLANT
SUT VIC AB 1 CT1 36 (SUTURE) ×4 IMPLANT
SUT VIC AB 2-0 CT2 27 (SUTURE) ×2 IMPLANT
SYR 20ML LL LF (SYRINGE) ×2 IMPLANT
SYR 30ML LL (SYRINGE) ×4 IMPLANT
TIBIAL BASE ROT PLAT SZ 8 KNEE (Knees) ×2 IMPLANT
TIP FAN IRRIG PULSAVAC PLUS (DISPOSABLE) ×1 IMPLANT
TOWEL OR 17X26 4PK STRL BLUE (TOWEL DISPOSABLE) ×2 IMPLANT
TOWER CARTRIDGE SMART MIX (DISPOSABLE) ×2 IMPLANT
TRAY FOLEY MTR SLVR 16FR STAT (SET/KITS/TRAYS/PACK) ×2 IMPLANT
WATER STERILE IRR 500ML POUR (IV SOLUTION) ×2 IMPLANT
WRAPON POLAR PAD KNEE (MISCELLANEOUS) ×2

## 2021-09-19 NOTE — Op Note (Signed)
OPERATIVE NOTE  DATE OF SURGERY:  09/19/2021  PATIENT NAME:  ELDO UMANZOR   DOB: 1935-09-18  MRN: [id number]  PRE-OPERATIVE DIAGNOSIS: Degenerative arthrosis of the right knee, primary  POST-OPERATIVE DIAGNOSIS:  Same  PROCEDURE:  Right total knee arthroplasty using computer-assisted navigation  SURGEON:  Marciano Sequin. M.D.  ASSISTANT: Cassell Smiles, PA-C (present and scrubbed throughout the case, critical for assistance with exposure, retraction, instrumentation, and closure)  ANESTHESIA: spinal  ESTIMATED BLOOD LOSS: 50 mL  FLUIDS REPLACED: 750 mL of crystalloid  TOURNIQUET TIME: 116 minutes  DRAINS: 2 medium Hemovac drains  SOFT TISSUE RELEASES: Anterior cruciate ligament, posterior cruciate ligament, deep medial collateral ligament, patellofemoral ligament, posterolateral corner  IMPLANTS UTILIZED: DePuy Attune size 8 posterior stabilized femoral component (cemented), size 8 rotating platform tibial component (cemented), 41 mm medialized dome patella (cemented), and a 5 mm stabilized rotating platform polyethylene insert.  INDICATIONS FOR SURGERY: JANATHAN BRIBIESCA is a 86 y.o. year old male with a long history of progressive knee pain. X-rays demonstrated severe degenerative changes in tricompartmental fashion. The patient had not seen any significant improvement despite conservative nonsurgical intervention. After discussion of the risks and benefits of surgical intervention, the patient expressed understanding of the risks benefits and agree with plans for total knee arthroplasty.   The risks, benefits, and alternatives were discussed at length including but not limited to the risks of infection, bleeding, nerve injury, stiffness, blood clots, the need for revision surgery, cardiopulmonary complications, among others, and they were willing to proceed.  PROCEDURE IN DETAIL: The patient was brought into the operating room and, after adequate spinal anesthesia was  achieved, a tourniquet was placed on the patient's upper thigh. The patient's knee and leg were cleaned and prepped with alcohol and DuraPrep and draped in the usual sterile fashion. A "timeout" was performed as per usual protocol. The lower extremity was exsanguinated using an Esmarch, and the tourniquet was inflated to 300 mmHg. An anterior longitudinal incision was made followed by a standard mid vastus approach. The deep fibers of the medial collateral ligament were elevated in a subperiosteal fashion off of the medial flare of the tibia so as to maintain a continuous soft tissue sleeve. The patella was subluxed laterally and the patellofemoral ligament was incised. Inspection of the knee demonstrated severe degenerative changes with full-thickness loss of articular cartilage. Osteophytes were debrided using a rongeur. Anterior and posterior cruciate ligaments were excised. Two 4.0 mm Schanz pins were inserted in the femur and into the tibia for attachment of the array of trackers used for computer-assisted navigation. Hip center was identified using a circumduction technique. Distal landmarks were mapped using the computer. The distal femur and proximal tibia were mapped using the computer. The distal femoral cutting guide was positioned using computer-assisted navigation so as to achieve a 5 distal valgus cut. The femur was sized and it was felt that a size 8 femoral component was appropriate. A size 8 femoral cutting guide was positioned and the anterior cut was performed and verified using the computer. This was followed by completion of the posterior and chamfer cuts. Femoral cutting guide for the central box was then positioned in the center box cut was performed.  Attention was then directed to the proximal tibia. Medial and lateral menisci were excised. The extramedullary tibial cutting guide was positioned using computer-assisted navigation so as to achieve a 0 varus-valgus alignment and 3  posterior slope. The cut was performed and verified using the computer. The proximal  tibia was sized and it was felt that a size 8 tibial tray was appropriate. Tibial and femoral trials were inserted followed by insertion of a 5 mm polyethylene insert. The knee was felt to be tight laterally.  The trial components were removed and the knee was brought into full extension and distracted using the [place]land retractors.  The posterolateral corner was carefully released using a combination of electrocautery and Metzenbaum scissors.  Trial components were reinserted followed by placement of a 5 mm polyethylene trial.  This allowed for excellent mediolateral soft tissue balancing both in flexion and in full extension. Finally, the patella was cut and prepared so as to accommodate a 41 mm medialized dome patella. A patella trial was placed and the knee was placed through a range of motion with excellent patellar tracking appreciated. The femoral trial was removed after debridement of posterior osteophytes. The central post-hole for the tibial component was reamed followed by insertion of a keel punch. Tibial trials were then removed. Cut surfaces of bone were irrigated with copious amounts of normal saline using pulsatile lavage and then suctioned dry. Polymethylmethacrylate cement was prepared in the usual fashion using a vacuum mixer. Cement was applied to the cut surface of the proximal tibia as well as along the undersurface of a size 8 rotating platform tibial component. Tibial component was positioned and impacted into place. Excess cement was removed using [employer] fast streamer. Cement was then applied to the cut surfaces of the femur as well as along the posterior flanges of the size 8 femoral component. The femoral component was positioned and impacted into place. Excess cement was removed using [employer] fast streamer. A 5 mm polyethylene trial was inserted and the knee was brought into full extension with steady axial  compression applied. Finally, cement was applied to the backside of a 41 mm medialized dome patella and the patellar component was positioned and patellar clamp applied. Excess cement was removed using [employer] fast streamer. After adequate curing of the cement, the tourniquet was deflated after a total tourniquet time of 116 minutes. Hemostasis was achieved using electrocautery. The knee was irrigated with copious amounts of normal saline using pulsatile lavage followed by 500 ml of Surgiphor and then suctioned dry. 20 mL of 1.3% Exparel and 60 mL of 0.25% Marcaine in 40 mL of normal saline was injected along the posterior capsule, medial and lateral gutters, and along the arthrotomy site. A 5 mm stabilized rotating platform polyethylene insert was inserted and the knee was placed through a range of motion with excellent mediolateral soft tissue balancing appreciated and excellent patellar tracking noted. 2 medium drains were placed in the wound bed and brought out through separate stab incisions. The medial parapatellar portion of the incision was reapproximated using interrupted sutures of #1 Vicryl. Subcutaneous tissue was approximated in layers using first #0 Vicryl followed #2-0 Vicryl. The skin was approximated with skin staples. A sterile dressing was applied.  The patient tolerated the procedure well and was transported to the recovery room in stable condition.    Kelise Kuch P. Holley Bouche., M.D.

## 2021-09-19 NOTE — H&P (Signed)
The patient has been re-examined, and the chart reviewed, and there have been no interval changes to the documented history and physical.    The risks, benefits, and alternatives have been discussed at length. The patient expressed understanding of the risks benefits and agreed with plans for surgical intervention.  Uchechi Denison P. Marzella Miracle, Jr. M.D.    

## 2021-09-19 NOTE — Transfer of Care (Signed)
Immediate Anesthesia Transfer of Care Note  Patient: [name]  Procedure(s) Performed: COMPUTER ASSISTED TOTAL KNEE ARTHROPLASTY (Right: Knee)  Patient Location: PACU  Anesthesia Type:Spinal  Level of Consciousness: drowsy  Airway & Oxygen Therapy: Patient Spontanous Breathing and Patient connected to face mask oxygen  Post-op Assessment: Report given to RN  Post vital signs: stable  Last Vitals:  Vitals Value Taken Time  BP 91/58 09/19/21 1233  Temp    Pulse 64 09/19/21 1237  Resp 14 09/19/21 1237  SpO2 99 % 09/19/21 1237  Vitals shown include unvalidated device data.  Last Pain:  Vitals:   09/19/21 0711  TempSrc: Temporal  PainSc: 0-No pain         Complications: No notable events documented.

## 2021-09-19 NOTE — Anesthesia Preprocedure Evaluation (Addendum)
Anesthesia Evaluation  Patient identified by MRN, date of birth, ID band Patient awake    Reviewed: Allergy & Precautions, NPO status , Patient's Chart, lab work & pertinent test results  History of Anesthesia Complications Negative for: history of anesthetic complications  Airway Mallampati: II  TM Distance: >3 FB Neck ROM: Full    Dental  (+) Teeth Intact   Pulmonary neg pulmonary ROS, neg sleep apnea, neg COPD, Patient abstained from smoking.Not current smoker, former smoker,    Pulmonary exam normal breath sounds clear to auscultation       Cardiovascular Exercise Tolerance: Good METS(-) hypertensionpulmonary hypertension+ CAD and +CHF  (-) Past MI + dysrhythmias Atrial Fibrillation  Rhythm:Regular Rate:Normal - Systolic murmurs TRANSTHORACIC ECHOCARDIOGRAM performed on 10/25/2020 1. Left ventricular ejection fraction, by estimation, is 45 to 50%. The left ventricle has mildly decreased function. The left ventricle  demonstrates global hypokinesis. There is mild left ventricular hypertrophy. Left ventricular diastolic parameters are indeterminate. 2. Right ventricular systolic function is normal. The right ventricular size is normal.  3. Right atrial size was mildly dilated.  4. The mitral valve is normal in structure. Mild mitral valve regurgitation.  5. The aortic valve is tricuspid. Aortic valve regurgitation is mild.  6. Aortic dilatation noted. There is mild dilatation of the aortic root, measuring 40 mm. There is moderate dilatation of the ascending aorta,  measuring 44 mm.    RIGHT/LEFT HEART CATHETERIZATION AND CORONARY ANGIOGRAPHY performed on 06/13/2017 1. Mild to moderate, nonobstructive coronary artery disease involving the RCA. Mid LAD myocardial bridging; otherwise no significant CAD involving the left coronary artery. 2. Mildly elevated left heart filling pressure.  ? LVEDP 20-23 mmHg 3. Moderately elevated  right heart filling pressure. ? RA (mean): 14 mmHg ? RV (S/EDP): 40/15 mmHg 4. Mild pulmonary hypertension ? PA (S/D, mean): 40/20 (27) mmHg ? PCWP (mean): 20 mmHg 5. Normal Fick cardiac output ? Fick CO: 6.6 L/min ? Fick CI: 2.9 L/min/m   Neuro/Psych PSYCHIATRIC DISORDERS Depression negative neurological ROS     GI/Hepatic GERD  ,(+)     (-) substance abuse  ,   Endo/Other  neg diabetes  Renal/GU CRFRenal disease     Musculoskeletal  (+) Arthritis ,   Abdominal   Peds  Hematology   Anesthesia Other Findings Past Medical History: No date: Aortic atherosclerosis ([place]) No date: Aortic root dilatation (HCC)     Comment:  a.) TTE 05/29/2017 --> measured 28 mm. b.)TTE 10/25/2020              --> measured 40 mm. No date: Arthritis No date: B12 deficiency No date: Bilateral lower extremity edema No date: BPH (benign prostatic hyperplasia) No date: [place] of lung (HCC)     Comment:  a. seen on CT. No date: CAD (coronary artery disease)     Comment:  a.  Elective R/LHC 06/12/18 showed mild-moderate               nonobstructive RCA CAD with mild LAD myocardial bridging               but no significant obstructive CAD; there was mildly               elevated L heart filling pressure, moderately elevated R               heart filling pressure, and mild pulm HTN. No date: Chronic anticoagulation     Comment:  Apixaban No date: Chronic knee pain No date: CKD (  chronic kidney disease), stage III (HCC) No date: Cough No date: DJD (degenerative joint disease) No date: Erectile dysfunction No date: Former tobacco use No date: GERD (gastroesophageal reflux disease)     Comment:  occ-tums prn No date: Hemorrhoids No date: HFrEF (heart failure with reduced ejection fraction) (HCC) No date: Hyperbilirubinemia No date: Hyperlipidemia No date: Mild aortic insufficiency No date: Mild pulmonary hypertension ([place]) No date: Moderate mitral regurgitation No date: NICM (nonischemic  cardiomyopathy) ([place])     Comment:  a. EF 40-45% by echo 05/2017, improved to 45-50% by echo               10/2020. No date: PAC (premature atrial contraction) No date: Paroxysmal A-fib ([place]) 11/2018: Pleural effusion, left No date: Pneumonia No date: Prolonged QT syndrome No date: RBBB No date: Rosacea No date: Ruptured spleen No date: Skin cancer     Comment:  Left wrist - tx by Dr Koleen Nimrod in the past. 11/27/2018: Squamous cell carcinoma of skin     Comment:  L inf mandible  No date: Thoracic aortic aneurysm     Comment:  a.) TTE 05/29/2017 --> 4.2 cm . b.) CT 12/2017 -->               measured 4.4 cm, imaged incompletely at 4.3cm in 10/2018               CT. c.) CTA 07/27/2021 --> measured 4.5 cm. No date: Varicocele No date: Wears dentures     Comment:  partial upper No date: Wheezing  Reproductive/Obstetrics                            Anesthesia Physical Anesthesia Plan  ASA: 3  Anesthesia Plan: Spinal   Post-op Pain Management:    Induction: Intravenous  PONV Risk Score and Plan: 1 and Ondansetron, Dexamethasone, Propofol infusion, TIVA and Treatment may vary due to age or medical condition  Airway Management Planned: Natural Airway  Additional Equipment: None  Intra-op Plan:   Post-operative Plan:   Informed Consent: I have reviewed the patients History and Physical, chart, labs and discussed the procedure including the risks, benefits and alternatives for the proposed anesthesia with the patient or authorized representative who has indicated his/her understanding and acceptance.       Plan Discussed with: CRNA and Surgeon  Anesthesia Plan Comments: (Discussed R/B/A of neuraxial anesthesia technique with patient: - rare risks of spinal/epidural hematoma, nerve damage, infection - Risk of PDPH - Risk of nausea and vomiting - Risk of conversion to general anesthesia and its associated risks, including sore throat, damage to  lips/teeth/oropharynx, and rare risks such as cardiac and respiratory events. - Risk of allergic reactions  Patient voiced understanding.)        Anesthesia Quick Evaluation

## 2021-09-19 NOTE — Evaluation (Signed)
Physical Therapy Evaluation Patient Details Name: Anthony Mosley MRN: [id number] DOB: [birth year] Today's Date: 09/19/2021  History of Present Illness  Pt is an 86 year old male who presents for elective R TKA with Dr. Marry Guan 09/19/2021. PMH of a-fib, CAD, CHF, dyslipidemia, and hyperglycemia.   Clinical Impression  Pt is a pleasant 86 year old male who presents to PT evaluation POD #0 R TKA and currently is WBAT. Pt lives alone and was independent prior to admission and did not use any assistive devices. Currently, pt is requiring CGA for sit to stand transfer and ambulation of 4 ft to recliner chair. Pt instructed on seated and supine exercises to be completed as well as positioning to encourage R knee extension. Pt able to complete 10 SLR and will not require usage of R knee immobilizer. Pt has ramped entrance to his home and family reporting that they have set up an Aid/RN to come in during the day to assist the patient for a few hours. Recommending home with HHPT and intermittent SPV for mobility. Pt will continue to benefit from skilled PT services to assist with transition home and improve independence.      Recommendations for follow up therapy are one component of a multi-disciplinary discharge planning process, led by the attending physician.  Recommendations may be updated based on patient status, additional functional criteria and insurance authorization.  Follow Up Recommendations Home health PT;Supervision - Intermittent    Equipment Recommendations  None recommended by PT    Recommendations for Other Services       Precautions / Restrictions Precautions Precautions: Knee;Fall Restrictions Weight Bearing Restrictions: Yes RLE Weight Bearing: Weight bearing as tolerated      Mobility  Bed Mobility Overal bed mobility: Needs Assistance Bed Mobility: Supine to Sit     Supine to sit: Supervision     General bed mobility comments: Verbal cues to move B LEs off EOB  first. Some mild dizziness with initial sitting    Transfers Overall transfer level: Needs assistance Equipment used: Rolling walker (2 wheeled) Transfers: Sit to/from Stand Sit to Stand: Min guard         General transfer comment: CGA and verbal cues for hand placement on bed to assist with pushing to stand. No dizziness with initial standing  Ambulation/Gait Ambulation/Gait assistance: Min guard Gait Distance (Feet): 4 Feet Assistive device: Rolling walker (2 wheeled) Gait Pattern/deviations: Step-to pattern;Wide base of support     General Gait Details: Pt able to take steps to chair with safe usage of RW throughout. Cues for usage of UEs as needed to decrease R knee discomfort  Stairs            Wheelchair Mobility    Modified Rankin (Stroke Patients Only)       Balance Overall balance assessment: Needs assistance Sitting-balance support: No upper extremity supported;Feet supported Sitting balance-Leahy Scale: Normal     Standing balance support: Bilateral upper extremity supported;During functional activity Standing balance-Leahy Scale: Fair Standing balance comment: Required B UE with inital standing           Pertinent Vitals/Pain Pain Assessment: 0-10 Pain Score: 1  Pain Location: R anterior knee Pain Descriptors / Indicators: Dull;Aching Pain Intervention(s): Limited activity within patient's tolerance;Monitored during session;Repositioned;Relaxation;Ice applied    Home Living Family/patient expects to be discharged to:: Private residence Living Arrangements: Alone Available Help at Discharge: Available PRN/intermittently;Family Type of Home: House Home Access: Ramped entrance     Home Layout: One level Home Equipment:  Walker - 2 wheels;Cane - single point;Grab bars - toilet;Grab bars - tub/shower;Shower seat Additional Comments: Pt and family reports that they have arranged for someone to come to the house from 8-1pm and reports he may be  alone from 1-4pm    Prior Function Level of Independence: Independent         Comments: No prior AD usage     Hand Dominance        Extremity/Trunk Assessment   Upper Extremity Assessment Upper Extremity Assessment: Overall WFL for tasks assessed    Lower Extremity Assessment Lower Extremity Assessment: RLE deficits/detail;Overall [facility] for tasks assessed RLE Deficits / Details: Deferred formal MMT on R LE due to surgery. Pt has sensation in R foot. Able to perform SLR x 10 RLE Sensation: WNL RLE Coordination: WNL       Communication   Communication: No difficulties  Cognition Arousal/Alertness: Awake/alert Behavior During Therapy: WFL for tasks assessed/performed Overall Cognitive Status: Within Functional Limits for tasks assessed           General Comments      Exercises Total Joint Exercises Ankle Circles/Pumps: AROM;Strengthening;Both;20 reps;Seated Quad Sets: AROM;Strengthening;10 reps;Supine Straight Leg Raises: AROM;Strengthening;Right;10 reps;Supine Goniometric ROM: 12 - 90 Other Exercises Other Exercises: Pt educated on polar care, bone foam usage to promote extension of R knee, and discharge plan for home health PT. Pt left sitting in recliner with pillow under distal calf and instructed on long sitting exercises to promote gentle ROM.   Assessment/Plan    PT Assessment Patient needs continued PT services  PT Problem List Decreased strength;Decreased range of motion;Decreased activity tolerance;Decreased balance;Decreased mobility;Decreased knowledge of use of DME;Pain       PT Treatment Interventions DME instruction;Gait training;Stair training;Functional mobility training;Therapeutic activities;Therapeutic exercise;Balance training;Neuromuscular re-education;Patient/family education;Manual techniques;Modalities    PT Goals (Current goals can be found in the Care Plan section)  Acute Rehab PT Goals Patient Stated Goal: to get back to walking and  gardening PT Goal Formulation: With patient Time For Goal Achievement: 10/03/21 Potential to Achieve Goals: Good    Frequency BID   Barriers to discharge        Co-evaluation               AM-PAC PT "6 Clicks" Mobility  Outcome Measure Help needed turning from your back to your side while in a flat bed without using bedrails?: A Little Help needed moving from lying on your back to sitting on the side of a flat bed without using bedrails?: A Little Help needed moving to and from a bed to a chair (including a wheelchair)?: A Little Help needed standing up from a chair using your arms (e.g., wheelchair or bedside chair)?: A Little Help needed to walk in hospital room?: A Little Help needed climbing 3-5 steps with a railing? : A Little 6 Click Score: 18    End of Session Equipment Utilized During Treatment: Gait belt Activity Tolerance: Patient tolerated treatment well Patient left: in chair;with call bell/phone within reach;with chair alarm set;with family/visitor present   PT Visit Diagnosis: Muscle weakness (generalized) (M62.81);Other abnormalities of gait and mobility (R26.89);Pain Pain - Right/Left: Right Pain - part of body: Knee    Time: 2993-7169 PT Time Calculation (min) (ACUTE ONLY): 28 min   Charges:   PT Evaluation $PT Eval Low Complexity: 1 Low PT Treatments $Therapeutic Exercise: 8-22 mins        Andrey Campanile, SPT   Andrey Campanile 09/19/2021, 4:23 PM

## 2021-09-19 NOTE — Progress Notes (Signed)
PHARMACIST - PHYSICIAN ORDER COMMUNICATION  CONCERNING: P&T Medication Policy on Herbal / OTC Medications  DESCRIPTION:  This patient's order for:  potassium 99 mg (2.5 mEq potassium)  has been noted.  This product was not able to be converted to a standard equivalent hospital dose of KCl   ACTION TAKEN: The pharmacy department is unable to verify this order at this time  Please reevaluate patient's clinical condition at discharge and address if the herbal or natural product(s) should be resumed at that time.

## 2021-09-19 NOTE — Anesthesia Procedure Notes (Signed)
Spinal  Patient location during procedure: OR Start time: 09/19/2021 8:22 AM Reason for block: surgical anesthesia Staffing Performed: resident/CRNA  Preanesthetic Checklist Completed: patient identified, IV checked, site marked, risks and benefits discussed, surgical consent, monitors and equipment checked, pre-op evaluation and timeout performed Spinal Block Patient position: sitting Prep: DuraPrep Patient monitoring: heart rate, cardiac monitor, continuous pulse ox and blood pressure Approach: midline Location: L3-4 Injection technique: single-shot Needle Needle type: Sprotte  Needle gauge: 24 G Needle length: 9 cm Assessment Sensory level: T4 Events: CSF return Additional Notes Negative for blood and paresthesia.  No pain with injection.  Attempt x1.  Pt tolerated well.  Good free flow CSF pre/post injection.

## 2021-09-20 ENCOUNTER — Encounter: Payer: Self-pay | Admitting: Orthopedic Surgery

## 2021-09-20 MED ORDER — CELECOXIB 200 MG PO CAPS: 200.0000 mg | ORAL_CAPSULE | Freq: Two times a day (BID) | ORAL | 0 refills | Status: DC

## 2021-09-20 MED ORDER — APIXABAN 5 MG PO TABS: 5.0000 mg | ORAL_TABLET | Freq: Two times a day (BID) | ORAL | Status: DC

## 2021-09-20 MED ORDER — TRAMADOL HCL 50 MG PO TABS: 50.0000 mg | ORAL_TABLET | ORAL | 0 refills | Status: DC | PRN

## 2021-09-20 MED ORDER — OXYCODONE HCL 5 MG PO TABS: 5.0000 mg | ORAL_TABLET | ORAL | 0 refills | Status: DC | PRN

## 2021-09-20 NOTE — Progress Notes (Signed)
Physical Therapy Treatment Patient Details Name: Anthony Mosley MRN: [id number] DOB: 1935/08/03 Today's Date: 09/20/2021   History of Present Illness Pt is an 86 year old male who presents for elective R TKA with Dr. Marry Guan 09/19/2021. PMH of a-fib, CAD, CHF, dyslipidemia, and hyperglycemia.    PT Comments    Pt demonstrating great improvements on POD day #1 after R TKA. Pt able to ambulate >160 ft with RW with SPV/close SBA and negotiated 4 steps with B handrails. Pt plans to d/c home with support of family and aids throughout the day however, reports decreased coverage between 1-4pm. Pt educated on safety during the time that he will not have anyone present to reduce likelihood of falls in the home. Pt has all DME equipment to discharge home safely. Pt will continue to benefit from skilled PT services to improve ROM, strength, and endurance to be able to return to prior level of function.      Recommendations for follow up therapy are one component of a multi-disciplinary discharge planning process, led by the attending physician.  Recommendations may be updated based on patient status, additional functional criteria and insurance authorization.  Follow Up Recommendations  Home health PT;Supervision - Intermittent     Equipment Recommendations  None recommended by PT    Recommendations for Other Services       Precautions / Restrictions Precautions Precautions: Knee;Fall Restrictions Weight Bearing Restrictions: Yes RLE Weight Bearing: Weight bearing as tolerated     Mobility  Bed Mobility Overal bed mobility:  (pt in chair at start of evaluation)             General bed mobility comments: Deferred pt received in standing from OT    Transfers Overall transfer level: Needs assistance Equipment used: Rolling walker (2 wheeled) Transfers: Sit to/from Stand Sit to Stand: Supervision Stand pivot transfers: Min guard       General transfer comment: SPV for  transfers with usage of RW  Ambulation/Gait Ambulation/Gait assistance: Supervision Gait Distance (Feet): 200 Feet Assistive device: Rolling walker (2 wheeled) Gait Pattern/deviations: Step-through pattern;Decreased step length - left;Decreased dorsiflexion - right     General Gait Details: Pt ambulated with step through pattern with cues for heel strike to normalize gait and encourage R knee extension   Stairs Stairs: Yes Stairs assistance: Min guard Stair Management: Two rails;Step to pattern;Forwards Number of Stairs: 4 General stair comments: CGA for safety. Cueing for sequencing for improved safety   Wheelchair Mobility    Modified Rankin (Stroke Patients Only)       Balance Overall balance assessment: Mild deficits observed, not formally tested Sitting-balance support: Feet supported;No upper extremity supported Sitting balance-Leahy Scale: Normal     Standing balance support: No upper extremity supported;During functional activity Standing balance-Leahy Scale: Fair Standing balance comment: Pt demonstrating good stability in static standing without UE assistance. Pt requiring B UE to improve dynamic balance with ambulation                            Cognition Arousal/Alertness: Awake/alert Behavior During Therapy: WFL for tasks assessed/performed Overall Cognitive Status: Within Functional Limits for tasks assessed                                        Exercises Total Joint Exercises Ankle Circles/Pumps: AROM;Strengthening;Both;20 reps;Seated Quad Sets: AROM;Strengthening;Right;Supine;5 reps [employer]  Quad: AROM;Strengthening;Right;10 reps;Seated Knee Flexion: AROM;Right;20 reps;Seated Goniometric ROM: 15-90 Other Exercises Other Exercises: Pt given handout with all exercises and demonstrates understanding. All questions asked and answered    General Comments        Pertinent Vitals/Pain Pain Assessment: No/denies  pain Pain Score: 1  Pain Location: R anterior knee Pain Descriptors / Indicators: Other (Comment) (Stinging) Pain Intervention(s): Limited activity within patient's tolerance;Monitored during session;Premedicated before session;Repositioned;Utilized relaxation techniques;Ice applied    Home Living Family/patient expects to be discharged to:: Private residence Living Arrangements: Alone Available Help at Discharge: Available PRN/intermittently;Family (family lives near to assist, aid is hired for day time; pt reports son will be available/"on call" for night) Type of Home: House Home Access: [place]: One [place]: Environmental consultant - 2 wheels;Cane - single point;Grab bars - toilet;Grab bars - tub/shower;Shower seat      Prior Function Level of Independence: Independent      Comments: independent in ADL/IADL PTA   PT Goals (current goals can now be found in the care plan section) Acute Rehab PT Goals Patient Stated Goal: to get back to gardening PT Goal Formulation: With patient Time For Goal Achievement: 10/03/21 Potential to Achieve Goals: Good Progress towards PT goals: Progressing toward goals    Frequency    BID      PT Plan Current plan remains appropriate    Co-evaluation              AM-PAC PT "6 Clicks" Mobility   Outcome Measure  Help needed turning from your back to your side while in a flat bed without using bedrails?: A Little Help needed moving from lying on your back to sitting on the side of a flat bed without using bedrails?: A Little Help needed moving to and from a bed to a chair (including a wheelchair)?: A Little Help needed standing up from a chair using your arms (e.g., wheelchair or bedside chair)?: A Little Help needed to walk in hospital room?: A Little Help needed climbing 3-5 steps with a railing? : A Little 6 Click Score: 18    End of Session Equipment Utilized During Treatment: Gait belt Activity Tolerance:  Patient tolerated treatment well Patient left: in chair;with call bell/phone within reach;with chair alarm set;with SCD's reapplied Nurse Communication: Mobility status PT Visit Diagnosis: Muscle weakness (generalized) (M62.81);Other abnormalities of gait and mobility (R26.89);Pain Pain - Right/Left: Right Pain - part of body: Knee     Time: 4315-4008 PT Time Calculation (min) (ACUTE ONLY): 30 min  Charges:  $Gait Training: 8-22 mins $Therapeutic Exercise: 8-22 mins                     Andrey Campanile, SPT    Andrey Campanile 09/20/2021, 1:43 PM

## 2021-09-20 NOTE — Progress Notes (Signed)
Post-op dressing removed. , Hemovac removed., and Mini compression dressing applied.    

## 2021-09-20 NOTE — TOC Initial Note (Signed)
Transition of Care [facility]) - Initial/Assessment Note    Patient Details  Name: Anthony Mosley MRN: [id number] Date of Birth: Dec 26, 1934  Transition of [facility]) CM/SW Contact:    Pete Pelt, RN Phone Number: 09/20/2021, 10:47 AM  Clinical Narrative:   Patient lives at home, son will be spending the night with patient.  Other son lives 3 miles away and can respond as needed.  Patient also has friends/personal care aides at home to assist with care.  Son transports patients to appointments, medications are delivered to home.    Patient has a pill box, does not always use it, but denies skipping doses or further challenges with taking medications as delivered.         Patient is on [employer], confirmed by [place].  Patient declines DME, states he has this at home.  TOC will follow for further needs, TOC contact information given.         Expected Discharge Plan: [place] Barriers to Discharge: Continued Medical Work up   Patient Goals and CMS Choice     Choice offered to / list presented to : NA  Expected Discharge Plan and Services Expected Discharge Plan: [place]   Discharge Planning Services: CM Consult Post Acute Care Choice: [place] arrangements for the past 2 months: Single Family Home                 DME Arranged:  (declined DME)         HH Arranged: PT, OT HH Agency: [place] Date [place]: 09/20/21 Time HH Agency Contacted: 34 Representative spoke with at [place]: [place] prior to admit, confirmed with [place]  Prior Living Arrangements/Services Living arrangements for the past 2 months: Single Family Home Lives with:: Self Patient language and need for interpreter reviewed:: Yes (No interpreter reequired) Do you feel safe going back to the place where you live?: Yes      Need for Family Participation in Patient Care: Yes (Comment)   Current home services: DME  (Already has DME at home) Criminal Activity/Legal Involvement Pertinent to Current Situation/Hospitalization: No - Comment as needed  Activities of Daily Living Home Assistive Devices/Equipment: None ADL Screening (condition at time of admission) Patient's cognitive ability adequate to safely complete daily activities?: Yes Is the patient deaf or have difficulty hearing?: No Does the patient have difficulty seeing, even when wearing glasses/contacts?: No Does the patient have difficulty concentrating, remembering, or making decisions?: No Patient able to express need for assistance with ADLs?: Yes Does the patient have difficulty dressing or bathing?: No Independently performs ADLs?: Yes (appropriate for developmental age) Does the patient have difficulty walking or climbing stairs?: Yes Weakness of Legs: Right Weakness of Arms/Hands: None  Permission Sought/Granted   Permission granted to share information with : Yes, Verbal Permission Granted     Permission granted to share info w AGENCY: [facility]        Emotional Assessment Appearance:: Appears stated age Attitude/Demeanor/Rapport: Gracious, Engaged Affect (typically observed): Pleasant, Appropriate Orientation: : Oriented to Self, Oriented to Place, Oriented to  Time, Oriented to Situation Alcohol / Substance Use: Not Applicable Psych Involvement: No (comment)  Admission diagnosis:  Total knee replacement status [Z96.659] Patient Active Problem List   Diagnosis Date Noted   Total knee replacement status 09/19/2021   Other abnormal glucose 03/04/2021   Heart failure with reduced ejection fraction ([place]) 02/02/2021   Chronic  HFrEF (heart failure with reduced ejection fraction) ([place]) 01/24/2021   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   Weakness of both lower extremities 07/12/2020   Claudication ([place]) 05/04/2020   Lower urinary tract symptoms (LUTS) 05/04/2020   Arthritis of right knee 05/04/2020    Pain in limb 03/28/2020   PAF (paroxysmal atrial fibrillation) ([place]) 12/14/2019   Asymptomatic varicose veins of both lower extremities 08/16/2019   Iron deficiency 08/16/2019   Anemia 08/13/2019   Vitamin B12 deficiency 03/24/2019   Congestive heart failure (CHF) ([place]) 02/22/2019   Right inguinal hernia 01/21/2019   PAC (premature atrial contraction) 11/11/2018   Bilateral leg edema 11/11/2018   Splenic laceration, subsequent encounter 11/10/2018   Hemoperitoneum    Near syncope    Splenic rupture 10/29/2018   Prediabetes 08/27/2018   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   Benign neoplasm of ascending colon    Rectal polyp    Benign neoplasm of descending colon    Positive colorectal cancer screening using Cologuard test 08/07/2017   Coronary artery disease involving native coronary artery of native heart without angina pectoris 07/02/2017   Thoracic aortic aneurysm without rupture 07/02/2017   Cardiomyopathy ([place]) 06/13/2017   Abnormal EKG 04/24/2017   Dyspnea on exertion 04/24/2017   Encounter for follow-up surveillance of skin cancer 09/04/2016   Hyperbilirubinemia 05/02/2016   Benign fibroma of prostate 10/04/2015   Atherosclerosis of coronary artery 10/04/2015   Gonalgia 10/04/2015   Degeneration of intervertebral disc of lumbar region 10/04/2015   Dyslipidemia 10/04/2015   Failure of erection 10/04/2015   Hemorrhoid 10/04/2015   Keratosis 10/04/2015   Stage 3 chronic kidney disease ([place]) 10/04/2015   Long Q-T syndrome 10/04/2015   Arthritis of knee, degenerative 10/04/2015   Arthritis of shoulder region, degenerative 10/04/2015   Scalp tenderness 10/04/2015   Skin lesion 10/04/2015   Scrotal varicose veins 10/04/2015   Cataract of both eyes 10/04/2015   Disorder of the skin and subcutaneous tissue, unspecified 10/04/2015   Osteoarthritis of left knee 03/31/2015   PCP:  Olin Hauser, DO Pharmacy:   [place] [place] - [address] [place] [address] Phone: [phone] Fax: [phone]  [facility] [place], [address] [place] [address] Phone: [phone] Fax: [phone]     Social Determinants of Health (SDOH) Interventions    Readmission Risk Interventions No flowsheet data found.

## 2021-09-20 NOTE — Discharge Summary (Signed)
Physician Discharge Summary  Patient ID: Anthony Mosley MRN: [id number] DOB/AGE: 12/19/1934 86 y.o.  Admit date: 09/19/2021 Discharge date: 09/20/2021  Admission Diagnoses:  Total knee replacement status [Z96.659]  Surgeries:Procedure(s):  Right total knee arthroplasty using computer-assisted navigation   SURGEON:  Marciano Sequin. M.D.   ASSISTANT: Cassell Smiles, PA-C (present and scrubbed throughout the case, critical for assistance with exposure, retraction, instrumentation, and closure)   ANESTHESIA: spinal   ESTIMATED BLOOD LOSS: 50 mL   FLUIDS REPLACED: 750 mL of crystalloid   TOURNIQUET TIME: 116 minutes   DRAINS: 2 medium Hemovac drains   SOFT TISSUE RELEASES: Anterior cruciate ligament, posterior cruciate ligament, deep medial collateral ligament, patellofemoral ligament, posterolateral corner   IMPLANTS UTILIZED: DePuy Attune size 8 posterior stabilized femoral component (cemented), size 8 rotating platform tibial component (cemented), 41 mm medialized dome patella (cemented), and a 5 mm stabilized rotating platform polyethylene insert.  Discharge Diagnoses: Patient Active Problem List   Diagnosis Date Noted   Total knee replacement status 09/19/2021   Other abnormal glucose 03/04/2021   Heart failure with reduced ejection fraction ([place]) 02/02/2021   Chronic HFrEF (heart failure with reduced ejection fraction) ([place]) 01/24/2021   Mild recurrent major depression ([place]) 07/12/2020   Weakness of both lower extremities 07/12/2020   Claudication ([place]) 05/04/2020   Lower urinary tract symptoms (LUTS) 05/04/2020   Arthritis of right knee 05/04/2020   Pain in limb 03/28/2020   PAF (paroxysmal atrial fibrillation) ([place]) 12/14/2019   Asymptomatic varicose veins of both lower extremities 08/16/2019   Iron deficiency 08/16/2019   Anemia 08/13/2019   Vitamin B12 deficiency 03/24/2019   Congestive heart failure (CHF) ([place]) 02/22/2019   Right inguinal hernia 01/21/2019    PAC (premature atrial contraction) 11/11/2018   Bilateral leg edema 11/11/2018   Splenic laceration, subsequent encounter 11/10/2018   Hemoperitoneum    Near syncope    Splenic rupture 10/29/2018   Prediabetes 08/27/2018   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   Benign neoplasm of ascending colon    Rectal polyp    Benign neoplasm of descending colon    Positive colorectal cancer screening using Cologuard test 08/07/2017   Coronary artery disease involving native coronary artery of native heart without angina pectoris 07/02/2017   Thoracic aortic aneurysm without rupture 07/02/2017   Cardiomyopathy ([place]) 06/13/2017   Abnormal EKG 04/24/2017   Dyspnea on exertion 04/24/2017   Encounter for follow-up surveillance of skin cancer 09/04/2016   Hyperbilirubinemia 05/02/2016   Benign fibroma of prostate 10/04/2015   Atherosclerosis of coronary artery 10/04/2015   Gonalgia 10/04/2015   Degeneration of intervertebral disc of lumbar region 10/04/2015   Dyslipidemia 10/04/2015   Failure of erection 10/04/2015   Hemorrhoid 10/04/2015   Keratosis 10/04/2015   Stage 3 chronic kidney disease ([place]) 10/04/2015   Long Q-T syndrome 10/04/2015   Arthritis of knee, degenerative 10/04/2015   Arthritis of shoulder region, degenerative 10/04/2015   Scalp tenderness 10/04/2015   Skin lesion 10/04/2015   Scrotal varicose veins 10/04/2015   Cataract of both eyes 10/04/2015   Disorder of the skin and subcutaneous tissue, unspecified 10/04/2015   Osteoarthritis of left knee 03/31/2015    Past Medical History:  Diagnosis Date   Aortic atherosclerosis (HCC)    Aortic root dilatation ([place])    a.) TTE 05/29/2017 --> measured 28 mm. b.)TTE 10/25/2020 --> measured 40 mm.   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 seen on CT.   CAD (coronary artery disease)    a.  Elective R/LHC 06/12/18 showed mild-moderate nonobstructive  RCA CAD with mild LAD myocardial bridging but no significant obstructive CAD; there was mildly elevated L heart filling pressure, moderately elevated R heart filling pressure, and mild pulm HTN.   Chronic anticoagulation    Apixaban   Chronic knee pain    CKD (chronic kidney disease), stage III (HCC)    Cough    DJD (degenerative joint disease)    Erectile dysfunction    Former tobacco use    GERD (gastroesophageal reflux disease)    occ-tums prn   Hemorrhoids    HFrEF (heart failure with reduced ejection fraction) (HCC)    Hyperbilirubinemia    Hyperlipidemia    Mild aortic insufficiency    Mild pulmonary hypertension (HCC)    Moderate mitral regurgitation    NICM (nonischemic cardiomyopathy) ([place])    a. EF 40-45% by echo 05/2017, improved to 45-50% by echo 10/2020.   PAC (premature atrial contraction)    Paroxysmal A-fib (HCC)    Pleural effusion, left 11/2018   Pneumonia    Prolonged QT syndrome    RBBB    Rosacea    Ruptured spleen    Skin cancer    Left wrist - tx by Dr Koleen Nimrod in the past.   Squamous cell carcinoma of skin 11/27/2018   L inf mandible    Thoracic aortic aneurysm    a.) TTE 05/29/2017 --> 4.2 cm . b.) CT 12/2017 --> measured 4.4 cm, imaged incompletely at 4.3cm in 10/2018 CT. c.) CTA 07/27/2021 --> measured 4.5 cm.   Varicocele    Wears dentures    partial upper   Wheezing      Transfusion:    Consultants (if any):   Discharged Condition: Improved  Hospital Course: Anthony Mosley is an 86 y.o. male who was admitted 09/19/2021 with a diagnosis of right knee osteoarthritis and went to the operating room on 09/19/2021 and underwent right total knee arthroplasty. The patient received perioperative antibiotics for prophylaxis (see below). The patient tolerated the procedure well and was transported to PACU in stable condition. After meeting PACU criteria, the patient was subsequently transferred to the Orthopaedics/Rehabilitation unit.   The patient  received DVT prophylaxis in the form of early mobilization, Foot Pumps, TED hose, and Eliquis . A sacral pad had been placed and heels were elevated off of the bed with rolled towels in order to protect skin integrity. Foley catheter was discontinued on postoperative day #0. Wound drains were discontinued on postoperative day #1. The surgical incision was healing well without signs of infection.  Physical therapy was initiated postoperatively for transfers, gait training, and strengthening. Occupational therapy was initiated for activities of daily living and evaluation for assisted devices. Rehabilitation goals were reviewed in detail with the patient. The patient made steady progress with physical therapy and physical therapy recommended discharge to Home.   The patient achieved the preliminary goals of this hospitalization and was felt to be medically and orthopaedically appropriate for discharge.  He was given perioperative antibiotics:  Anti-infectives (From admission, onward)    Start     Dose/Rate Route Frequency Ordered Stop   09/19/21 1600  ceFAZolin (ANCEF) IVPB 2g/100 mL premix        2 g 200 mL/hr over 30 Minutes Intravenous Every 6 hours 09/19/21 1503 09/19/21 2359   09/19/21 0657  ceFAZolin (ANCEF) 2-4  GM/100ML-% IVPB       Note to Pharmacy: Lyman Bishop   : cabinet override      09/19/21 0657 09/19/21 1637   09/19/21 0600  ceFAZolin (ANCEF) IVPB 2g/100 mL premix  Status:  Discontinued        2 g 200 mL/hr over 30 Minutes Intravenous On call to O.R. 09/19/21 0255 09/19/21 0710     .  Recent vital signs:  Vitals:   09/20/21 1146 09/20/21 1521  BP: 116/61 114/62  Pulse: (!) 50 (!) 50  Resp: 17 19  Temp: (!) 97.3 F (36.3 C) (!) 97.5 F (36.4 C)  SpO2: 99% 100%    Recent laboratory studies:  No results for input(s): WBC, HGB, HCT, PLT, K, CL, CO2, BUN, CREATININE, GLUCOSE, CALCIUM, LABPT, INR in the last 72 hours.  Diagnostic Studies: DG Knee Right Port  Result  Date: 09/19/2021 CLINICAL DATA:  Right knee arthroplasty EXAM: PORTABLE RIGHT KNEE - 1-2 VIEW COMPARISON:  02/22/2014 FINDINGS: Interval postsurgical changes from right total knee arthroplasty. Arthroplasty components are in their expected alignment. No periprosthetic fracture or evidence of other complication. Expected postoperative changes within the overlying soft tissues including surgical drains. IMPRESSION: Satisfactory postoperative appearance status post right total knee arthroplasty. Electronically Signed   By: Davina Poke D.O.   On: 09/19/2021 14:00    Discharge Medications:   Allergies as of 09/20/2021       Reactions   Macrolides And Ketolides Other (See Comments)   Prolonged Q-T syndrome   Norvasc [amlodipine] Other (See Comments)   Bradycardia   Avodart [dutasteride] Itching   Tape Rash   Surgical Tape after appendectomy        Medication List     TAKE these medications    apixaban 2.5 MG Tabs tablet Commonly known as: ELIQUIS Take 1 tablet (2.5 mg total) by mouth 2 (two) times daily.   atorvastatin 80 MG tablet Commonly known as: LIPITOR Take 1 tablet (80 mg total) by mouth daily. What changed: when to take this   B COMPLEX VITAMINS PO Take 1 tablet by mouth daily.   B-12 2500 MCG Tabs Take 2,500 mcg by mouth daily.   calcium carbonate 500 MG chewable tablet Commonly known as: TUMS - dosed in mg elemental calcium Chew 1 tablet by mouth as needed for indigestion or heartburn.   celecoxib 200 MG capsule Commonly known as: CELEBREX Take 1 capsule (200 mg total) by mouth 2 (two) times daily.   cholecalciferol 25 MCG (1000 UNIT) tablet Commonly known as: VITAMIN D3 Take 1,000 Units by mouth daily.   COLLAGEN PO Take 1 Scoop by mouth daily.   diclofenac sodium 1 % Gel Commonly known as: VOLTAREN Apply 4 g topically 4 (four) times daily. To the knee if needed   EYE DROPS ADVANCED RELIEF OP Apply 1 drop to eye as needed.   fluticasone 50 MCG/ACT  nasal spray Commonly known as: FLONASE Place 1 spray into both nostrils daily as needed (congestion).   furosemide 20 MG tablet Commonly known as: LASIX Take 1 tablet (20 mg total) by mouth daily as needed. What changed: when to take this   Guaifenesin 1200 MG Tb12 Take 1,200 mg by mouth at bedtime.   LUBRICATING EYE DROPS OP Apply 1 drop to eye as needed (dry eyes).   oxyCODONE 5 MG immediate release tablet Commonly known as: Oxy IR/ROXICODONE Take 1 tablet (5 mg total) by mouth every 4 (four) hours as needed for severe pain (pain score  4-6).   Potassium 99 MG Tabs Take 99 mg by mouth daily. Leg cramps   traMADol 50 MG tablet Commonly known as: ULTRAM Take 1 tablet (50 mg total) by mouth every 4 (four) hours as needed for moderate pain.               Durable Medical Equipment  (From admission, onward)           Start     Ordered   09/19/21 1503  DME Walker rolling  Once       Question:  Patient needs a walker to treat with the following condition  Answer:  Total knee replacement status   09/19/21 1503   09/19/21 1503  DME Bedside commode  Once       Question:  Patient needs a bedside commode to treat with the following condition  Answer:  Total knee replacement status   09/19/21 1503            Disposition: Home with home health PT     Follow-up Information     Fausto Skillern, PA-C Follow up on 10/04/2021.   Specialty: Orthopedic Surgery Why: at 1:45pm Contact information: [place] [address] [phone]         Dereck Leep, MD Follow up on 11/01/2021.   Specialty: Orthopedic Surgery Why: at 11:30am Contact information: [place] [address] [place], PA-C 09/20/2021, 4:58 PM

## 2021-09-20 NOTE — Progress Notes (Addendum)
  Subjective: 1 Day Post-Op Procedure(s) (LRB): COMPUTER ASSISTED TOTAL KNEE ARTHROPLASTY (Right) Patient reports pain as well-controlled.   Patient is well, and has had no acute complaints or problems Plan is to go Home after hospital stay. Negative for chest pain and shortness of breath Fever: no Gastrointestinal: negative for nausea and vomiting.  Patient has not had a bowel movement.  Objective: Vital signs in last 24 hours: Temp:  [96.7 F (35.9 C)-99 F (37.2 C)] 97 F (36.1 C) (10/06 0431) Pulse Rate:  [50-74] 50 (10/06 0431) Resp:  [10-19] 18 (10/06 0431) BP: (91-107)/(55-70) 107/60 (10/06 0431) SpO2:  [90 %-100 %] 100 % (10/06 0431)  Intake/Output from previous day:  Intake/Output Summary (Last 24 hours) at 09/20/2021 0756 Last data filed at 09/20/2021 0449 Gross per 24 hour  Intake 1631.2 ml  Output 1170 ml  Net 461.2 ml    Intake/Output this shift: No intake/output data recorded.  Labs: No results for input(s): HGB in the last 72 hours. No results for input(s): WBC, RBC, HCT, PLT in the last 72 hours. No results for input(s): NA, K, CL, CO2, BUN, CREATININE, GLUCOSE, CALCIUM in the last 72 hours. No results for input(s): LABPT, INR in the last 72 hours.   EXAM General - Patient is Alert, Appropriate, and Oriented Extremity - Neurovascular intact Dorsiflexion/Plantar flexion intact Compartment soft Dressing/Incision -Postoperative dressing remains in place., Polar Care in place and working. , Hemovac in place.  Motor Function - intact, moving foot and toes well on exam. Able to perform independent SLR.  Cardiovascular- bradycardic rate and regular rhythm, no murmurs/rubs/gallops Respiratory- Lungs clear to auscultation bilaterally Gastrointestinal- soft, nontender, and active bowel sounds   Assessment/Plan: 1 Day Post-Op Procedure(s) (LRB): COMPUTER ASSISTED TOTAL KNEE ARTHROPLASTY (Right) Active Problems:   Total knee replacement status  Estimated  body mass index is 27.06 kg/m as calculated from the following:   Height as of this encounter: 6' (1.829 m).   Weight as of this encounter: 90.5 kg. Advance diet Up with therapy       DVT Prophylaxis - Eliquis, Ted hose, and foot pumps Weight-Bearing as tolerated to right leg  Cassell Smiles, PA-C [facility] Orthopaedic Surgery 09/20/2021, 7:56 AM

## 2021-09-20 NOTE — Evaluation (Signed)
Occupational Therapy Evaluation Patient Details Name: Anthony Mosley MRN: [id number] DOB: 1935/01/10 Today's Date: 09/20/2021   History of Present Illness Pt is an 86 year old male who presents for elective R TKA with Dr. Marry Guan 09/19/2021. PMH of a-fib, CAD, CHF, dyslipidemia, and hyperglycemia.   Clinical Impression   Pt greeted in room, agreeable to OT evaluation. Pt is POD#1 from R TKA. Pt reports he lives alone, was independent in ADL/IADL. Pt will have intermittent assistance at home following discharge. Pt instructed in polar care mgt, falls prevention strategies, home/routines modifications, DME/AE for LB bathing and dressing tasks, and compression stocking mgt. Pt performs standing grooming tasks, dressing, toileting with CGA- close supervision. Good safety awareness noted throughout.  Pt would benefit from skilled OT services including additional instruction in dressing techniques with or without assistive devices for dressing and bathing skills to support recall and carryover prior to discharge and ultimately to maximize safety, independence, and minimize falls risk and caregiver burden. Do not currently anticipate any OT needs following this hospitalization.  Pt is left in care of PT team, NAD, all needs met.      Recommendations for follow up therapy are one component of a multi-disciplinary discharge planning process, led by the attending physician.  Recommendations may be updated based on patient status, additional functional criteria and insurance authorization.   Follow Up Recommendations  No OT follow up    Equipment Recommendations   (pt has recommended 3 in 1 commode and shower chair)    Recommendations for Other Services       Precautions / Restrictions Precautions Precautions: Knee;Fall Restrictions Weight Bearing Restrictions: Yes RLE Weight Bearing: Weight bearing as tolerated      Mobility Bed Mobility Overal bed mobility:  (pt in chair at start of  evaluation)                  Transfers Overall transfer level: Needs assistance Equipment used: Rolling walker (2 wheeled) Transfers: Sit to/from [facility] Sit to Stand: Min guard Stand pivot transfers: Min guard            Balance Overall balance assessment: Needs assistance Sitting-balance support: Feet unsupported Sitting balance-Leahy Scale: Normal     Standing balance support: Bilateral upper extremity supported;During functional activity Standing balance-Leahy Scale: Fair                             ADL either performed or assessed with clinical judgement   ADL Overall ADL's : Needs assistance/impaired Eating/Feeding: Independent   Grooming: Wash/dry hands;Wash/dry face;Oral care;Supervision/safety;Standing Grooming Details (indicate cue type and reason): close SUP in standing at the sink for grooming tasks for approx 10 minutes         Upper Body Dressing : Set up   Lower Body Dressing: Moderate assistance Lower Body Dressing Details (indicate cue type and reason): educated on use of AE Toilet Transfer: Copy Details (indicate cue type and reason): simulated Toileting- Clothing Manipulation and Hygiene: Supervision/safety;Modified independent Toileting - Clothing Manipulation Details (indicate cue type and reason): MOD I with urinal, CLOSE SUP standing at toilet     Functional mobility during ADLs: Supervision/safety;Rolling walker       Vision Baseline Vision/History: 1 Wears glasses              Pertinent Vitals/Pain Pain Assessment: No/denies pain Pain Score: 0-No pain        Extremity/Trunk Assessment Upper Extremity Assessment Upper  Extremity Assessment: Overall WFL for tasks assessed   Lower Extremity Assessment Lower Extremity Assessment: RLE deficits/detail       Communication Communication Communication: No difficulties   Cognition Arousal/Alertness:  Awake/alert Behavior During Therapy: WFL for tasks assessed/performed Overall Cognitive Status: Within Functional Limits for tasks assessed                                                Home Living Family/patient expects to be discharged to:: Private residence Living Arrangements: Alone Available Help at Discharge: Available PRN/intermittently;Family (family lives near to assist, aid is hired for day time; pt reports son will be available/"on call" for night) Type of Home: House Home Access: [place]: One level     Bathroom Shower/Tub: Occupational psychologist: [place] Accessibility: Yes   Home Equipment: Environmental consultant - 2 wheels;Cane - single point;Grab bars - toilet;Grab bars - tub/shower;Shower seat          Prior Functioning/Environment Level of Independence: Independent        Comments: independent in ADL/IADL PTA        OT Problem List: Decreased strength;Impaired balance (sitting and/or standing);Decreased activity tolerance      OT Treatment/Interventions: Self-care/ADL training;DME and/or AE instruction;Therapeutic exercise;Therapeutic activities;Balance training;Modalities;Patient/family education    OT Goals(Current goals can be found in the care plan section) Acute Rehab OT Goals Patient Stated Goal: to get back to gardening OT Goal Formulation: With patient Time For Goal Achievement: 10/04/21 Potential to Achieve Goals: Good ADL Goals Pt Will Perform Grooming: with modified independence;standing Pt Will Perform Lower Body Dressing: with modified independence Pt Will Transfer to Toilet: with modified independence  OT Frequency: Min 1X/week       AM-PAC OT "6 Clicks" Daily Activity     Outcome Measure Help from another person eating meals?: None Help from another person taking care of personal grooming?: None Help from another person toileting, which includes using toliet, bedpan, or urinal?: A  Little Help from another person bathing (including washing, rinsing, drying)?: A Little Help from another person to put on and taking off regular upper body clothing?: A Little Help from another person to put on and taking off regular lower body clothing?: A Little 6 Click Score: 20   End of Session Equipment Utilized During Treatment: Gait belt;Rolling walker  Activity Tolerance: Patient tolerated treatment well Patient left: Other (comment) (in care of PT)  OT Visit Diagnosis: Unsteadiness on feet (R26.81);Other abnormalities of gait and mobility (R26.89)                Time: 1031-5945 OT Time Calculation (min): 24 min Charges:  OT General Charges $OT Visit: 1 Visit OT Evaluation $OT Eval Low Complexity: 1 Low OT Treatments $Self Care/Home Management : 8-22 mins  Shanon Payor, OTD OTR/L  09/20/21, 10:34 AM

## 2021-09-20 NOTE — Anesthesia Postprocedure Evaluation (Signed)
Anesthesia Post Note  Patient: Anthony Mosley  Procedure(s) Performed: COMPUTER ASSISTED TOTAL KNEE ARTHROPLASTY (Right: Knee)  Patient location during evaluation: Nursing Unit Anesthesia Type: Spinal Level of consciousness: oriented and awake and alert Pain management: pain level controlled Vital Signs Assessment: post-procedure vital signs reviewed and stable Respiratory status: spontaneous breathing, respiratory function stable and patient connected to nasal cannula oxygen Cardiovascular status: blood pressure returned to baseline and stable Postop Assessment: no headache, no backache and no apparent nausea or vomiting Anesthetic complications: no   No notable events documented.   Last Vitals:  Vitals:   09/19/21 2325 09/20/21 0431  BP: 101/61 107/60  Pulse: 62 (!) 50  Resp: 18 18  Temp: 36.5 C (!) 36.1 C  SpO2: 99% 100%    Last Pain:  Vitals:   09/19/21 2325  TempSrc:   PainSc: 0-No pain                 Mylei Brackeen,  Clearnce Sorrel

## 2021-09-21 ENCOUNTER — Telehealth: Payer: Self-pay

## 2021-09-21 NOTE — Telephone Encounter (Signed)
Transition Care Management Follow-up Telephone Call Date of discharge and from where: 09/20/2021 [facility] How have you been since you were released from the hospital? " Knee pain last night"  feeling some better now Any questions or concerns? No  Items Reviewed: Did the pt receive and understand the discharge instructions provided? Yes  Medications obtained and verified? Yes  Other? No  Any new allergies since your discharge? No  Dietary orders reviewed? Yes Do you have support at home? Yes   Home Care and Equipment/Supplies: Were home health services ordered? yes If so, what is the name of the agency? [facility]  Has the agency set up a time to come to the patient's home? no Were any new equipment or medical supplies ordered?  Yes  walker and bedside commode What is the name of the medical supply agency?  Were you able to get the supplies/equipment? yes Do you have any questions related to the use of the equipment or supplies? No  Functional Questionnaire: (I = Independent and D = Dependent) ADLs: with assistance  Bathing/Dressing- with assistance  Meal Prep- D  Eating- I  Maintaining continence- I  Transferring/Ambulation- USING WALKER  Managing Meds- D  Follow up appointments reviewed:  PCP Hospital f/u appt confirmed? No   Specialist Hospital f/u appt confirmed?  Scheduled to see ORTHO on 10/04/2021  Are transportation arrangements needed? No  If their condition worsens, is the pt aware to call PCP or go to the Emergency Dept.? Yes Was the patient provided with contact information for the PCP's office or ED? Yes Was to pt encouraged to call back with questions or concerns? Yes  Tomasa Rand, RN, BSN, CEN [facility] [employer] [phone]

## 2021-09-22 DIAGNOSIS — I480 Paroxysmal atrial fibrillation: Secondary | ICD-10-CM | POA: Diagnosis not present

## 2021-09-22 DIAGNOSIS — N400 Enlarged prostate without lower urinary tract symptoms: Secondary | ICD-10-CM | POA: Diagnosis not present

## 2021-09-22 DIAGNOSIS — I5022 Chronic systolic (congestive) heart failure: Secondary | ICD-10-CM | POA: Diagnosis not present

## 2021-09-22 DIAGNOSIS — I255 Ischemic cardiomyopathy: Secondary | ICD-10-CM | POA: Diagnosis not present

## 2021-09-22 DIAGNOSIS — K219 Gastro-esophageal reflux disease without esophagitis: Secondary | ICD-10-CM | POA: Diagnosis not present

## 2021-09-22 DIAGNOSIS — N183 Chronic kidney disease, stage 3 unspecified: Secondary | ICD-10-CM | POA: Diagnosis not present

## 2021-09-22 DIAGNOSIS — Z471 Aftercare following joint replacement surgery: Secondary | ICD-10-CM | POA: Diagnosis not present

## 2021-09-22 DIAGNOSIS — E785 Hyperlipidemia, unspecified: Secondary | ICD-10-CM | POA: Diagnosis not present

## 2021-09-22 DIAGNOSIS — I2510 Atherosclerotic heart disease of native coronary artery without angina pectoris: Secondary | ICD-10-CM | POA: Diagnosis not present

## 2021-09-26 DIAGNOSIS — Z471 Aftercare following joint replacement surgery: Secondary | ICD-10-CM | POA: Diagnosis not present

## 2021-09-26 DIAGNOSIS — I480 Paroxysmal atrial fibrillation: Secondary | ICD-10-CM | POA: Diagnosis not present

## 2021-09-26 DIAGNOSIS — I255 Ischemic cardiomyopathy: Secondary | ICD-10-CM | POA: Diagnosis not present

## 2021-09-26 DIAGNOSIS — E785 Hyperlipidemia, unspecified: Secondary | ICD-10-CM | POA: Diagnosis not present

## 2021-09-26 DIAGNOSIS — I5022 Chronic systolic (congestive) heart failure: Secondary | ICD-10-CM | POA: Diagnosis not present

## 2021-09-26 DIAGNOSIS — N400 Enlarged prostate without lower urinary tract symptoms: Secondary | ICD-10-CM | POA: Diagnosis not present

## 2021-09-26 DIAGNOSIS — N183 Chronic kidney disease, stage 3 unspecified: Secondary | ICD-10-CM | POA: Diagnosis not present

## 2021-09-26 DIAGNOSIS — K219 Gastro-esophageal reflux disease without esophagitis: Secondary | ICD-10-CM | POA: Diagnosis not present

## 2021-09-26 DIAGNOSIS — I2510 Atherosclerotic heart disease of native coronary artery without angina pectoris: Secondary | ICD-10-CM | POA: Diagnosis not present

## 2021-09-27 DIAGNOSIS — Z471 Aftercare following joint replacement surgery: Secondary | ICD-10-CM | POA: Diagnosis not present

## 2021-09-27 DIAGNOSIS — I2510 Atherosclerotic heart disease of native coronary artery without angina pectoris: Secondary | ICD-10-CM | POA: Diagnosis not present

## 2021-09-27 DIAGNOSIS — I255 Ischemic cardiomyopathy: Secondary | ICD-10-CM | POA: Diagnosis not present

## 2021-09-27 DIAGNOSIS — K219 Gastro-esophageal reflux disease without esophagitis: Secondary | ICD-10-CM | POA: Diagnosis not present

## 2021-09-27 DIAGNOSIS — E785 Hyperlipidemia, unspecified: Secondary | ICD-10-CM | POA: Diagnosis not present

## 2021-09-27 DIAGNOSIS — I480 Paroxysmal atrial fibrillation: Secondary | ICD-10-CM | POA: Diagnosis not present

## 2021-09-27 DIAGNOSIS — I5022 Chronic systolic (congestive) heart failure: Secondary | ICD-10-CM | POA: Diagnosis not present

## 2021-09-27 DIAGNOSIS — N400 Enlarged prostate without lower urinary tract symptoms: Secondary | ICD-10-CM | POA: Diagnosis not present

## 2021-09-27 DIAGNOSIS — N183 Chronic kidney disease, stage 3 unspecified: Secondary | ICD-10-CM | POA: Diagnosis not present

## 2021-09-28 DIAGNOSIS — I255 Ischemic cardiomyopathy: Secondary | ICD-10-CM | POA: Diagnosis not present

## 2021-09-28 DIAGNOSIS — E785 Hyperlipidemia, unspecified: Secondary | ICD-10-CM | POA: Diagnosis not present

## 2021-09-28 DIAGNOSIS — N183 Chronic kidney disease, stage 3 unspecified: Secondary | ICD-10-CM | POA: Diagnosis not present

## 2021-09-28 DIAGNOSIS — I5022 Chronic systolic (congestive) heart failure: Secondary | ICD-10-CM | POA: Diagnosis not present

## 2021-09-28 DIAGNOSIS — I480 Paroxysmal atrial fibrillation: Secondary | ICD-10-CM | POA: Diagnosis not present

## 2021-09-28 DIAGNOSIS — N400 Enlarged prostate without lower urinary tract symptoms: Secondary | ICD-10-CM | POA: Diagnosis not present

## 2021-09-28 DIAGNOSIS — K219 Gastro-esophageal reflux disease without esophagitis: Secondary | ICD-10-CM | POA: Diagnosis not present

## 2021-09-28 DIAGNOSIS — I2510 Atherosclerotic heart disease of native coronary artery without angina pectoris: Secondary | ICD-10-CM | POA: Diagnosis not present

## 2021-09-28 DIAGNOSIS — Z471 Aftercare following joint replacement surgery: Secondary | ICD-10-CM | POA: Diagnosis not present

## 2021-10-01 DIAGNOSIS — I5022 Chronic systolic (congestive) heart failure: Secondary | ICD-10-CM | POA: Diagnosis not present

## 2021-10-01 DIAGNOSIS — N400 Enlarged prostate without lower urinary tract symptoms: Secondary | ICD-10-CM | POA: Diagnosis not present

## 2021-10-01 DIAGNOSIS — Z471 Aftercare following joint replacement surgery: Secondary | ICD-10-CM | POA: Diagnosis not present

## 2021-10-01 DIAGNOSIS — K219 Gastro-esophageal reflux disease without esophagitis: Secondary | ICD-10-CM | POA: Diagnosis not present

## 2021-10-01 DIAGNOSIS — N183 Chronic kidney disease, stage 3 unspecified: Secondary | ICD-10-CM | POA: Diagnosis not present

## 2021-10-01 DIAGNOSIS — I2510 Atherosclerotic heart disease of native coronary artery without angina pectoris: Secondary | ICD-10-CM | POA: Diagnosis not present

## 2021-10-01 DIAGNOSIS — I480 Paroxysmal atrial fibrillation: Secondary | ICD-10-CM | POA: Diagnosis not present

## 2021-10-01 DIAGNOSIS — I255 Ischemic cardiomyopathy: Secondary | ICD-10-CM | POA: Diagnosis not present

## 2021-10-01 DIAGNOSIS — E785 Hyperlipidemia, unspecified: Secondary | ICD-10-CM | POA: Diagnosis not present

## 2021-10-03 DIAGNOSIS — I255 Ischemic cardiomyopathy: Secondary | ICD-10-CM | POA: Diagnosis not present

## 2021-10-03 DIAGNOSIS — Z471 Aftercare following joint replacement surgery: Secondary | ICD-10-CM | POA: Diagnosis not present

## 2021-10-03 DIAGNOSIS — N183 Chronic kidney disease, stage 3 unspecified: Secondary | ICD-10-CM | POA: Diagnosis not present

## 2021-10-03 DIAGNOSIS — I2510 Atherosclerotic heart disease of native coronary artery without angina pectoris: Secondary | ICD-10-CM | POA: Diagnosis not present

## 2021-10-03 DIAGNOSIS — I5022 Chronic systolic (congestive) heart failure: Secondary | ICD-10-CM | POA: Diagnosis not present

## 2021-10-03 DIAGNOSIS — N400 Enlarged prostate without lower urinary tract symptoms: Secondary | ICD-10-CM | POA: Diagnosis not present

## 2021-10-03 DIAGNOSIS — K219 Gastro-esophageal reflux disease without esophagitis: Secondary | ICD-10-CM | POA: Diagnosis not present

## 2021-10-03 DIAGNOSIS — E785 Hyperlipidemia, unspecified: Secondary | ICD-10-CM | POA: Diagnosis not present

## 2021-10-03 DIAGNOSIS — I480 Paroxysmal atrial fibrillation: Secondary | ICD-10-CM | POA: Diagnosis not present

## 2021-10-04 DIAGNOSIS — M25661 Stiffness of right knee, not elsewhere classified: Secondary | ICD-10-CM | POA: Diagnosis not present

## 2021-10-04 DIAGNOSIS — Z96651 Presence of right artificial knee joint: Secondary | ICD-10-CM | POA: Diagnosis not present

## 2021-10-04 DIAGNOSIS — M6281 Muscle weakness (generalized): Secondary | ICD-10-CM | POA: Diagnosis not present

## 2021-10-04 DIAGNOSIS — M25561 Pain in right knee: Secondary | ICD-10-CM | POA: Diagnosis not present

## 2021-10-09 DIAGNOSIS — Z96651 Presence of right artificial knee joint: Secondary | ICD-10-CM | POA: Diagnosis not present

## 2021-10-09 DIAGNOSIS — M25561 Pain in right knee: Secondary | ICD-10-CM | POA: Diagnosis not present

## 2021-10-09 DIAGNOSIS — Z471 Aftercare following joint replacement surgery: Secondary | ICD-10-CM | POA: Diagnosis not present

## 2021-10-12 DIAGNOSIS — Z96651 Presence of right artificial knee joint: Secondary | ICD-10-CM | POA: Diagnosis not present

## 2021-10-12 DIAGNOSIS — M25561 Pain in right knee: Secondary | ICD-10-CM | POA: Diagnosis not present

## 2021-10-15 DIAGNOSIS — M25561 Pain in right knee: Secondary | ICD-10-CM | POA: Diagnosis not present

## 2021-10-15 DIAGNOSIS — Z96651 Presence of right artificial knee joint: Secondary | ICD-10-CM | POA: Diagnosis not present

## 2021-10-17 DIAGNOSIS — Z96651 Presence of right artificial knee joint: Secondary | ICD-10-CM | POA: Diagnosis not present

## 2021-10-18 ENCOUNTER — Other Ambulatory Visit: Payer: Self-pay | Admitting: *Deleted

## 2021-10-18 MED ORDER — ATORVASTATIN CALCIUM 80 MG PO TABS: 80.0000 mg | ORAL_TABLET | Freq: Every day | ORAL | 0 refills | Status: DC

## 2021-10-19 DIAGNOSIS — Z96651 Presence of right artificial knee joint: Secondary | ICD-10-CM | POA: Diagnosis not present

## 2021-10-19 DIAGNOSIS — M25561 Pain in right knee: Secondary | ICD-10-CM | POA: Diagnosis not present

## 2021-10-22 DIAGNOSIS — Z96651 Presence of right artificial knee joint: Secondary | ICD-10-CM | POA: Diagnosis not present

## 2021-10-22 DIAGNOSIS — M25561 Pain in right knee: Secondary | ICD-10-CM | POA: Diagnosis not present

## 2021-10-24 DIAGNOSIS — Z96651 Presence of right artificial knee joint: Secondary | ICD-10-CM | POA: Diagnosis not present

## 2021-10-26 DIAGNOSIS — M25561 Pain in right knee: Secondary | ICD-10-CM | POA: Diagnosis not present

## 2021-10-26 DIAGNOSIS — Z96651 Presence of right artificial knee joint: Secondary | ICD-10-CM | POA: Diagnosis not present

## 2021-10-29 ENCOUNTER — Other Ambulatory Visit: Payer: Self-pay | Admitting: Internal Medicine

## 2021-10-29 DIAGNOSIS — M25561 Pain in right knee: Secondary | ICD-10-CM | POA: Diagnosis not present

## 2021-10-29 DIAGNOSIS — Z96651 Presence of right artificial knee joint: Secondary | ICD-10-CM | POA: Diagnosis not present

## 2021-10-31 DIAGNOSIS — Z96651 Presence of right artificial knee joint: Secondary | ICD-10-CM | POA: Diagnosis not present

## 2021-10-31 DIAGNOSIS — M25561 Pain in right knee: Secondary | ICD-10-CM | POA: Diagnosis not present

## 2021-11-01 DIAGNOSIS — Z96651 Presence of right artificial knee joint: Secondary | ICD-10-CM | POA: Diagnosis not present

## 2021-11-05 DIAGNOSIS — Z96651 Presence of right artificial knee joint: Secondary | ICD-10-CM | POA: Insufficient documentation

## 2021-11-21 ENCOUNTER — Encounter: Payer: Self-pay | Admitting: Family Medicine

## 2021-11-21 ENCOUNTER — Other Ambulatory Visit: Payer: Self-pay

## 2021-11-21 ENCOUNTER — Ambulatory Visit (INDEPENDENT_AMBULATORY_CARE_PROVIDER_SITE_OTHER): Payer: Medicare HMO | Admitting: Family Medicine

## 2021-11-21 ENCOUNTER — Telehealth: Payer: Self-pay | Admitting: Family Medicine

## 2021-11-21 VITALS — BP 108/60 | HR 61 | Ht 73.0 in | Wt 202.0 lb

## 2021-11-21 DIAGNOSIS — N183 Chronic kidney disease, stage 3 unspecified: Secondary | ICD-10-CM

## 2021-11-21 DIAGNOSIS — I700 Atherosclerosis of aorta: Secondary | ICD-10-CM

## 2021-11-21 DIAGNOSIS — Z23 Encounter for immunization: Secondary | ICD-10-CM

## 2021-11-21 DIAGNOSIS — M170 Bilateral primary osteoarthritis of knee: Secondary | ICD-10-CM

## 2021-11-21 DIAGNOSIS — R7303 Prediabetes: Secondary | ICD-10-CM

## 2021-11-21 MED ORDER — ACCU-CHEK GUIDE VI STRP: ORAL_STRIP | 3 refills | Status: DC

## 2021-11-21 NOTE — Patient Instructions (Addendum)
Thank you for coming to the office today.  Great job with the knee  Call back with the name of the Glucometer testing device that you need test strips for.  Flu Shot today.   DUE for FASTING BLOOD WORK (no food or drink after midnight before the lab appointment, only water or coffee without cream/sugar on the morning of)  SCHEDULE "Lab Only" visit in the morning at the clinic for lab draw in 6 MONTHS    Please schedule a Follow-up Appointment to: Return in about 6 months (around 05/22/2022) for 6 month Annual Physical AM apt fasting lab AFTER.  If you have any other questions or concerns, please feel free to call the office or send a message through [place]. You may also schedule an earlier appointment if necessary.  Additionally, you may be receiving a survey about your experience at our office within a few days to 1 week by e-mail or mail. We value your feedback.  Nobie Putnam, DO [place]

## 2021-11-21 NOTE — Assessment & Plan Note (Signed)
Well-controlled Pre-DM with A1c 5.6  Plan:  1. Not on any therapy currently  2. Encourage improved lifestyle - low carb, low sugar diet, reduce portion size, continue improving regular exercise

## 2021-11-21 NOTE — Progress Notes (Addendum)
Subjective:    Patient ID: Anthony Mosley, male    DOB: [birth year], 86 y.o.   MRN: [id number]  Anthony Mosley is a 86 y.o. male presenting on 11/21/2021 for Osteoarthritis   HPI  Osteoarthritis Bilateral   Chronic Knee Pain, bilateral lower ext S/p R knee TKR =- Dr Marry Guan [facility] Improved overall after completing physical therapy post op   CKD-III Stable, previous issue. Last lab shows improved, Cr 1.28 (08/2021)   Pre-Diabetes A1c in past 5.4 to 5.7, last 5.6 (05/2021) He checks CBG occasional, needs new test strips, unsure name of meter will call back.   Major depression recurrent, mild Currently not on therapy Mood is stable, see below  Aortic Atherosclerosis On imaging  Health Maintenance: Due for Flu Shot, will receive today    Depression screen [facility] 2/9 11/21/2021 08/21/2021 05/17/2021  Decreased Interest 0 0 0  Down, Depressed, Hopeless 0 0 0  PHQ - 2 Score 0 0 0  Altered sleeping 0 - 0  Tired, decreased energy 0 - 0  Change in appetite 0 - 0  Feeling bad or failure about yourself  0 - 0  Trouble concentrating 0 - 0  Moving slowly or fidgety/restless 0 - 0  Suicidal thoughts 0 - 0  PHQ-9 Score 0 - 0  Difficult doing work/chores Not difficult at all - Not difficult at all  Some recent data might be hidden    Social History   Tobacco Use   Smoking status: Former    Packs/day: 1.50    Years: 20.00    Pack years: 30.00    Types: Cigarettes    Quit date: 1987    Years since quitting: 35.9   Smokeless tobacco: Former    Types: Chew    Quit date: 2000   Tobacco comments:    smoking cessation materials not required  Scientific laboratory technician Use: Never used  Substance Use Topics   Alcohol use: No    Alcohol/week: 0.0 standard drinks   Drug use: No    Review of Systems Per HPI unless specifically indicated above     Objective:    BP 108/60 (BP Location: Left Arm, Patient Position: Sitting, Cuff Size: Normal)   Pulse 61   Ht 6\' 1"  (1.854 m)   Wt  202 lb (91.6 kg)   SpO2 100%   BMI 26.65 kg/m   Wt Readings from Last 3 Encounters:  11/21/21 202 lb (91.6 kg)  09/19/21 199 lb 8.3 oz (90.5 kg)  09/10/21 199 lb 8.3 oz (90.5 kg)    Physical Exam Vitals and nursing note reviewed.  Constitutional:      General: He is not in acute distress.    Appearance: Normal appearance. He is well-developed. He is not diaphoretic.     Comments: Well-appearing, comfortable, cooperative  HENT:     Head: Normocephalic and atraumatic.  Eyes:     General:        Right eye: No discharge.        Left eye: No discharge.     Conjunctiva/sclera: Conjunctivae normal.  Cardiovascular:     Rate and Rhythm: Normal rate.  Pulmonary:     Effort: Pulmonary effort is normal.  Skin:    General: Skin is warm and dry.     Findings: No erythema or rash.  Neurological:     Mental Status: He is alert and oriented to person, place, and time.  Psychiatric:  Mood and Affect: Mood normal.        Behavior: Behavior normal.        Thought Content: Thought content normal.     Comments: Well groomed, good eye contact, normal speech and thoughts    Recent Labs    05/17/21 1420  HGBA1C 5.6*     Results for orders placed or performed during the hospital encounter of 09/17/21  SARS CORONAVIRUS 2 (TAT 6-24 HRS) Nasopharyngeal Nasopharyngeal Swab   Specimen: Nasopharyngeal Swab  Result Value Ref Range   SARS Coronavirus 2 NEGATIVE NEGATIVE      Assessment & Plan:   Problem List Items Addressed This Visit     Stage 3 chronic kidney disease ([place])    Stable CKD Last lab 08/2021 Cr 1.2 Encourage continue monitor Avoid nephrotoxic meds, improve hydration      Prediabetes - Primary    Well-controlled Pre-DM with A1c 5.6  Plan:  1. Not on any therapy currently  2. Encourage improved lifestyle - low carb, low sugar diet, reduce portion size, continue improving regular exercise      Arthritis of knee, degenerative   Other Visit Diagnoses     Needs  flu shot       Relevant Orders   Flu Vaccine QUAD High Dose(Fluad) (Completed)       Aortic Atheroclerosis Identified on prior imaging On Statin therapy.   R Knee s/p TKR improved overall  No orders of the defined types were placed in this encounter.    Follow up plan: Return in about 6 months (around 05/22/2022) for 6 month Annual Physical AM apt fasting lab AFTER.   Nobie Putnam, [facility] 11/21/2021, 9:02 AM

## 2021-11-21 NOTE — Telephone Encounter (Signed)
Copied from [place] [phone]. Topic: Quick Communication - Rx Refill/Question >> Nov 21, 2021 10:36 AM Tessa Lerner A wrote: Medication: test strips for the accucheck guide   Has the patient contacted their pharmacy? No.  The patient was told by their PCP to contact the practice rather than their pharmacy.  (Agent: If no, request that the patient contact the pharmacy for the refill. If patient does not wish to contact the pharmacy document the reason why and proceed with request.) (Agent: If yes, when and what did the pharmacy advise?)  Preferred Pharmacy (with phone number or street name): [place], [place]  Phone:  [phone] Fax:  [phone]  Has the patient been seen for an appointment in the last year OR does the patient have an upcoming appointment? Yes.    Agent: Please be advised that RX refills may take up to 3 business days. We ask that you follow-up with your pharmacy.

## 2021-11-21 NOTE — Assessment & Plan Note (Signed)
Stable CKD Last lab 08/2021 Cr 1.2 Encourage continue monitor Avoid nephrotoxic meds, improve hydration

## 2022-01-03 ENCOUNTER — Other Ambulatory Visit: Payer: Self-pay

## 2022-01-03 MED ORDER — APIXABAN 2.5 MG PO TABS: 2.5000 mg | ORAL_TABLET | Freq: Two times a day (BID) | ORAL | 3 refills | Status: DC

## 2022-01-03 NOTE — Telephone Encounter (Signed)
Prescription refill request for Eliquis received. Indication:Afib  Last office visit:8/22 Scr:1.2 Age: 87 Weight:91.6 kg  Prescription refilled

## 2022-01-15 ENCOUNTER — Ambulatory Visit: Payer: Medicare HMO | Admitting: Dermatology

## 2022-01-15 ENCOUNTER — Other Ambulatory Visit: Payer: Self-pay

## 2022-01-15 DIAGNOSIS — L570 Actinic keratosis: Secondary | ICD-10-CM | POA: Diagnosis not present

## 2022-01-15 DIAGNOSIS — L814 Other melanin hyperpigmentation: Secondary | ICD-10-CM

## 2022-01-15 DIAGNOSIS — Z85828 Personal history of other malignant neoplasm of skin: Secondary | ICD-10-CM | POA: Diagnosis not present

## 2022-01-15 DIAGNOSIS — L821 Other seborrheic keratosis: Secondary | ICD-10-CM | POA: Diagnosis not present

## 2022-01-15 DIAGNOSIS — L578 Other skin changes due to chronic exposure to nonionizing radiation: Secondary | ICD-10-CM

## 2022-01-15 MED ORDER — FLUOROURACIL 5 % EX CREA: TOPICAL_CREAM | CUTANEOUS | 0 refills | Status: DC

## 2022-01-15 NOTE — Patient Instructions (Addendum)
Cryotherapy Aftercare  Wash gently with soap and water everyday.   Apply Vaseline and Band-Aid daily until healed.   Start 5-fluorouracil/calcipotriene cream twice a day for 5-7 days to affected areas including cheeks and temples. Prescription sent to [facility]. Patient provided with contact information for pharmacy and advised the pharmacy will mail the prescription to their home. Patient provided with handout reviewing treatment course and side effects and advised to call or message [place] on MyChart with any concerns.    5-fluorouracil/calcipotriene cream is is a type of field treatment used to treat precancers, thin skin cancers, and areas of sun damage. Reviewed expected reaction including irritation and mild inflammation potentially progressing to more severe inflammation including redness, scaling, crusting and open sores/erosions.  Reviewed if too much irritation occurs, ensure application of only a thin layer and decrease frequency of use to achieve a tolerable level of inflammation. Recommend applying Vaseline ointment to open sores as needed.  Minimize sun exposure while under treatment. Recommend daily broad spectrum sunscreen SPF 30+ to sun-exposed areas, reapply every 2 hours as needed.     5-Fluorouracil/Calcipotriene Patient Education   Actinic keratoses are the dry, red scaly spots on the skin caused by sun damage. A portion of these spots can turn into skin cancer with time, and treating them can help prevent development of skin cancer.   Treatment of these spots requires removal of the defective skin cells. There are various ways to remove actinic keratoses, including freezing with liquid nitrogen, treatment with creams, or treatment with a blue light procedure in the office.   5-fluorouracil cream is a topical cream used to treat actinic keratoses. It works by interfering with the growth of abnormal fast-growing skin cells, such as actinic keratoses. These cells peel off  and are replaced by healthy ones.   5-fluorouracil/calcipotriene is a combination of the 5-fluorouracil cream with a vitamin D analog cream called calcipotriene. The calcipotriene alone does not treat actinic keratoses. However, when it is combined with 5-fluorouracil, it helps the 5-fluorouracil treat the actinic keratoses much faster so that the same results can be achieved with a much shorter treatment time.  INSTRUCTIONS FOR 5-FLUOROURACIL/CALCIPOTRIENE CREAM:   5-fluorouracil/calcipotriene cream typically only needs to be used for 4-7 days. A thin layer should be applied twice a day to the treatment areas recommended by your physician.   If your physician prescribed you separate tubes of 5-fluourouracil and calcipotriene, apply a thin layer of 5-fluorouracil followed by a thin layer of calcipotriene.   Avoid contact with your eyes, nostrils, and mouth. Do not use 5-fluorouracil/calcipotriene cream on infected or open wounds.   You will develop redness, irritation and some crusting at areas where you have pre-cancer damage/actinic keratoses. IF YOU DEVELOP PAIN, BLEEDING, OR SIGNIFICANT CRUSTING, STOP THE TREATMENT EARLY - you have already gotten a good response and the actinic keratoses should clear up well.  Wash your hands after applying 5-fluorouracil 5% cream on your skin.   A moisturizer or sunscreen with a minimum SPF 30 should be applied each morning.   Once you have finished the treatment, you can apply a thin layer of Vaseline twice a day to irritated areas to soothe and calm the areas more quickly. If you experience significant discomfort, contact your physician.  For some patients it is necessary to repeat the treatment for best results.  SIDE EFFECTS: When using 5-fluorouracil/calcipotriene cream, you may have mild irritation, such as redness, dryness, swelling, or a mild burning sensation. This usually resolves within  2 weeks. The more actinic keratoses you have, the more  redness and inflammation you can expect during treatment. Eye irritation has been reported rarely. If this occurs, please let us know.  If you have any trouble using this cream, please call the office. If you have any other questions about this information, please do not hesitate to ask me before you leave the office.   If You Need Anything After Your Visit  If you have any questions or concerns for your doctor, please call our main line at [phone] and press option 4 to reach your doctor's medical assistant. If no one answers, please leave a voicemail as directed and we will return your call as soon as possible. Messages left after 4 pm will be answered the following business day.   You may also send [place] a message via [place]. We typically respond to MyChart messages within 1-2 business days.  For prescription refills, please ask your pharmacy to contact our office. Our fax number is [phone].  If you have an urgent issue when the clinic is closed that cannot wait until the next business day, you can page your doctor at the number below.    Please note that while we do our best to be available for urgent issues outside of office hours, we are not available 24/7.   If you have an urgent issue and are unable to reach [place], you may choose to seek medical care at your doctor's office, retail clinic, urgent care center, or emergency room.  If you have a medical emergency, please immediately call 911 or go to the emergency department.  Pager Numbers  - Dr. Nehemiah Massed: [phone]  - Dr. Laurence Ferrari: [phone]  - Dr. Nicole Kindred: [phone]  In the event of inclement weather, please call our main line at [phone] for an update on the status of any delays or closures.  Dermatology Medication Tips: Please keep the boxes that topical medications come in in order to help keep track of the instructions about where and how to use these. Pharmacies typically print the medication instructions only  on the boxes and not directly on the medication tubes.   If your medication is too expensive, please contact our office at [phone] option 4 or send [place] a message through [place].   We are unable to tell what your co-pay for medications will be in advance as this is different depending on your insurance coverage. However, we may be able to find a substitute medication at lower cost or fill out paperwork to get insurance to cover a needed medication.   If a prior authorization is required to get your medication covered by your insurance company, please allow [place] 1-2 business days to complete this process.  Drug prices often vary depending on where the prescription is filled and some pharmacies may offer cheaper prices.  The website www.goodrx.com contains coupons for medications through different pharmacies. The prices here do not account for what the cost may be with help from insurance (it may be cheaper with your insurance), but the website can give you the price if you did not use any insurance.  - You can print the associated coupon and take it with your prescription to the pharmacy.  - You may also stop by our office during regular business hours and pick up a GoodRx coupon card.  - If you need your prescription sent electronically to a different pharmacy, notify our office through [facility] or by phone at [phone] option 4.  Si Usted Necesita Algo Despus de Su Visita  Tambin puede enviarnos un mensaje a travs de Pharmacist, community. Por lo general respondemos a los mensajes de MyChart en el transcurso de 1 a 2 das hbiles.  Para renovar recetas, por favor pida a su farmacia que se ponga en contacto con nuestra oficina. Harland Dingwall de fax es [place] [phone].  Si tiene un asunto urgente cuando la clnica est cerrada y que no puede esperar hasta el siguiente da hbil, puede llamar/localizar a su doctor(a) al nmero que aparece a continuacin.   Por favor, tenga en cuenta  que aunque hacemos todo lo posible para estar disponibles para asuntos urgentes fuera del horario de [place], no estamos disponibles las 24 horas del da, los 7 das de la [place].   Si tiene un problema urgente y no puede comunicarse con nosotros, puede optar por buscar atencin mdica  en el consultorio de su doctor(a), en una clnica privada, en un centro de atencin urgente o en una sala de emergencias.  Si tiene Engineering geologist, por favor llame inmediatamente al 911 o vaya a la sala de emergencias.  Nmeros de bper  - Dr. Nehemiah Massed: [phone]  - Dra. Moye: [phone]  - Dra. Nicole Kindred: [phone]  En caso de inclemencias del [place], por favor llame a Johnsie Kindred principal al [phone] para una actualizacin sobre el [place] de cualquier retraso o cierre.  Consejos para la medicacin en dermatologa: Por favor, guarde las cajas en las que vienen los medicamentos de uso tpico para ayudarle a seguir las instrucciones sobre dnde y cmo usarlos. Las farmacias generalmente imprimen las instrucciones del medicamento slo en las cajas y no directamente en los tubos del [place].   Si su medicamento es muy caro, por favor, pngase en contacto con Zigmund Daniel llamando al [phone] y presione la opcin 4 o envenos un mensaje a travs de Pharmacist, community.   No podemos decirle cul ser su copago por los medicamentos por adelantado ya que esto es diferente dependiendo de la cobertura de su seguro. Sin embargo, es posible que podamos encontrar un medicamento sustituto a Electrical engineer un formulario para que el seguro cubra el medicamento que se considera necesario.   Si se requiere una autorizacin previa para que su compaa de seguros [place] su medicamento, por favor permtanos de 1 a 2 das hbiles para completar este proceso.  Los precios de los medicamentos varan con frecuencia dependiendo del Environmental consultant de dnde se surte la receta y alguna farmacias pueden ofrecer precios ms  baratos.  El sitio web www.goodrx.com tiene cupones para medicamentos de Airline pilot. Los precios aqu no tienen en cuenta lo que podra costar con la ayuda del seguro (puede ser ms barato con su seguro), pero el sitio web puede darle el precio si no utiliz Research scientist (physical sciences).  - Puede imprimir el cupn correspondiente y llevarlo con su receta a la farmacia.  - Tambin puede pasar por nuestra oficina durante el horario de atencin regular y Charity fundraiser una tarjeta de cupones de GoodRx.  - Si necesita que su receta se enve electrnicamente a una farmacia diferente, informe a nuestra oficina a travs de MyChart de [place] o por telfono llamando al [phone] y presione la opcin 4.

## 2022-01-15 NOTE — Progress Notes (Signed)
Follow-Up Visit   Subjective  Anthony Mosley is a 87 y.o. male who presents for the following: Follow-up.  Patient presents for 6 month follow-up AKs. He has a few rough spots on his nose and a tender spot on his left neck. History of SCC of the left inferior jaw. He has used 5FU Cream in the past to face but it has been several years.   The following portions of the chart were reviewed this encounter and updated as appropriate:       Review of Systems:  No other skin or systemic complaints except as noted in HPI or Assessment and Plan.  Objective  Well appearing patient in no apparent distress; mood and affect are within normal limits.  A focused examination was performed including face, arms, scalp. Relevant physical exam findings are noted in the Assessment and Plan.  nose x 3, R cheek x 5, L cheek x 5, L temple x 1, L post neck x 1, R neck at mandible x 1 (16) Pink scaly macules    Assessment & Plan   Actinic Damage - Severe, confluent actinic changes with pre-cancerous actinic keratoses  - Severe, chronic, not at goal, secondary to cumulative UV radiation exposure over time - diffuse scaly erythematous macules and papules with underlying dyspigmentation - Discussed Prescription "Field Treatment" for Severe, Chronic Confluent Actinic Changes with Pre-Cancerous Actinic Keratoses Field treatment involves treatment of an entire area of skin that has confluent Actinic Changes (Sun/ Ultraviolet light damage) and PreCancerous Actinic Keratoses by method of PhotoDynamic Therapy (PDT) and/or prescription Topical Chemotherapy agents such as 5-fluorouracil, 5-fluorouracil/calcipotriene, and/or imiquimod.  The purpose is to decrease the number of clinically evident and subclinical PreCancerous lesions to prevent progression to development of skin cancer by chemically destroying early precancer changes that may or may not be visible.  It has been shown to reduce the risk of developing  skin cancer in the treated area. As a result of treatment, redness, scaling, crusting, and open sores may occur during treatment course. One or more than one of these methods may be used and may have to be used several times to control, suppress and eliminate the PreCancerous changes. Discussed treatment course, expected reaction, and possible side effects. - Recommend daily broad spectrum sunscreen SPF 30+ to sun-exposed areas, reapply every 2 hours as needed.  - Staying in the shade or wearing long sleeves, sun glasses (UVA+UVB protection) and wide brim hats (4-inch brim around the entire circumference of the hat) are also recommended. - Call for new or changing lesions.  Lentigines - Scattered tan macules - Due to sun exposure - Benign-appering, observe - Recommend daily broad spectrum sunscreen SPF 30+ to sun-exposed areas, reapply every 2 hours as needed. - Call for any changes  Seborrheic Keratoses - Stuck-on, waxy, tan-brown papules and/or plaques  - Benign-appearing - Discussed benign etiology and prognosis. - Observe - Call for any changes  History of Squamous Cell Carcinoma of the Skin - No evidence of recurrence today of the left inferior mandible - No lymphadenopathy - Recommend regular full body skin exams - Recommend daily broad spectrum sunscreen SPF 30+ to sun-exposed areas, reapply every 2 hours as needed.  - Call if any new or changing lesions are noted between office visits - Start 5-fluorouracil/calcipotriene cream twice a day for 5-7 days to affected areas including cheeks and temples. Prescription sent to [facility].  AK (actinic keratosis) (16) nose x 3, R cheek x 5, L cheek x 5,  L temple x 1, L post neck x 1, R neck at mandible x 1  Recheck right neck at mandible on f/u.  Once healed from cryotherapy treatment, start 5-fluorouracil/calcipotriene cream twice a day for 5-7 days to affected areas including cheeks and temples. Prescription sent to [facility]. Patient provided with contact information for pharmacy and advised the pharmacy will mail the prescription to their home. Patient provided with handout reviewing treatment course and side effects and advised to call or message [place] on MyChart with any concerns.  5-fluorouracil/calcipotriene cream is is a type of field treatment used to treat precancers, thin skin cancers, and areas of sun damage. Reviewed expected reaction including irritation and mild inflammation potentially progressing to more severe inflammation including redness, scaling, crusting and open sores/erosions.  Reviewed if too much irritation occurs, ensure application of only a thin layer and decrease frequency of use to achieve a tolerable level of inflammation. Recommend applying Vaseline ointment to open sores as needed.  Minimize sun exposure while under treatment. Recommend daily broad spectrum sunscreen SPF 30+ to sun-exposed areas, reapply every 2 hours as needed.   Actinic keratoses are precancerous spots that appear secondary to cumulative UV radiation exposure/sun exposure over time. They are chronic with expected duration over 1 year. A portion of actinic keratoses will progress to squamous cell carcinoma of the skin. It is not possible to reliably predict which spots will progress to skin cancer and so treatment is recommended to prevent development of skin cancer.  Recommend daily broad spectrum sunscreen SPF 30+ to sun-exposed areas, reapply every 2 hours as needed.  Recommend staying in the shade or wearing long sleeves, sun glasses (UVA+UVB protection) and wide brim hats (4-inch brim around the entire circumference of the hat). Call for new or changing lesions.  Destruction of lesion - nose x 3, R cheek x 5, L cheek x 5, L temple x 1, L post neck x 1, R neck at mandible x 1  Destruction method: cryotherapy   Informed consent: discussed and consent obtained   Lesion destroyed using liquid nitrogen: Yes   Region  frozen until ice ball extended beyond lesion: Yes   Outcome: patient tolerated procedure well with no complications   Post-procedure details: wound care instructions given   Additional details:  Prior to procedure, discussed risks of blister formation, small wound, skin dyspigmentation, or rare scar following cryotherapy. Recommend Vaseline ointment to treated areas while healing.   fluorouracil (EFUDEX) 5 % cream - nose x 3, R cheek x 5, L cheek x 5, L temple x 1, L post neck x 1, R neck at mandible x 1 Apply to cheeks and temples twice a day x 7 days as tolerated.   Return in about 6 months (around 07/15/2022) for UBSE, recheck R neck at mandible, AKs, Hx SCC.  IJamesetta Orleans, CMA, am acting as scribe for Brendolyn Patty, MD . Documentation: I have reviewed the above documentation for accuracy and completeness, and I agree with the above.  Brendolyn Patty MD

## 2022-01-31 ENCOUNTER — Encounter: Payer: Self-pay | Admitting: Internal Medicine

## 2022-01-31 NOTE — Progress Notes (Signed)
Follow-up Outpatient Visit Date: 02/01/2022  Primary Care Provider: Olin Hauser, DO [address] [place] [address]  Chief Complaint: Follow-up CAD, HFrEF, atrial fibrillation, and thoracic aortic aneurysm  HPI:  Anthony Mosley is a 87 y.o. male with history of coronary artery disease, chronic HFrEF with partially recovered LVEF, MAT versus PAF, pulmonary hypertension, thoracic aortic aneurysm, mild to moderate mitral regurgitation, hyperlipidemia, and chronic kidney disease stage III, who presents for follow-up of heart failure and atrial arrhythmias.  I last saw him in 07/2021, at which time he was mostly bothered by bilateral leg pain with prior unremarkable vascular evaluation.  He had minimal exertional dyspnea, which was stable, as well intermittent orthostatic lightheadedness.  We did not make any medication changes.  Follow-up CTA chest showed stable moderate dilation of the ascending aorta, measuring up to 4.5 cm.  Today, Anthony Mosley reports that he has been feeling fairly well.  His only complaint is of more frequent heartburn, often when he lies down after eating.  He may have this several days in a row and then go a week without recurrence.  Discomfort usually resolves with Tums.  He has not had any exertional chest pain or shortness of breath, palpitations, or lightheadedness.  He has mild lower extremity edema and is using his as needed furosemide most days.  Edema was a little worse following his right total knee replacement in 09/2021, though this is improving.  He has not had any significant bleeding, remaining on apixaban.  --------------------------------------------------------------------------------------------------  Past Medical History:  Diagnosis Date   Aortic atherosclerosis (HCC)    Aortic root dilatation (HCC)    a.) TTE 05/29/2017 --> measured 28 mm. 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 seen on CT.   CAD (coronary artery disease)    a.  Elective R/LHC 06/12/18 showed mild-moderate nonobstructive RCA CAD with mild LAD myocardial bridging but no significant obstructive CAD; there was mildly elevated L heart filling pressure, moderately elevated R heart filling pressure, and mild pulm HTN.   Chronic anticoagulation    Apixaban   Chronic knee pain    CKD (chronic kidney disease), stage III (HCC)    Cough    DJD (degenerative joint disease)    Erectile dysfunction    Former tobacco use    GERD (gastroesophageal reflux disease)    occ-tums prn   Hemorrhoids    HFrEF (heart failure with reduced ejection fraction) (HCC)    Hyperbilirubinemia    Hyperlipidemia    Mild aortic insufficiency    Mild pulmonary hypertension (HCC)    Moderate mitral regurgitation    NICM (nonischemic cardiomyopathy) ([place])    a. EF 40-45% by echo 05/2017, improved to 45-50% by echo 10/2020.   PAC (premature atrial contraction)    Paroxysmal A-fib (HCC)    Pleural effusion, left 11/2018   Pneumonia    Prolonged QT syndrome    RBBB    Rosacea    Ruptured spleen    Skin cancer    Left wrist - tx by Dr Koleen Nimrod in the past.   Squamous cell carcinoma of skin 11/27/2018   L inf mandible    Thoracic aortic aneurysm    a.) TTE 05/29/2017 --> 4.2 cm . b.) CT 12/2017 -->  measured 4.4 cm, imaged incompletely at 4.3cm in 10/2018 CT. c.) CTA 07/27/2021 --> measured 4.5 cm.   Varicocele    Wears dentures    partial upper   Wheezing    Past Surgical History:  Procedure Laterality Date   ABDOMINAL SURGERY     due to ruptured spleen   APPENDECTOMY     Dr Emilio Math   CARDIAC CATHETERIZATION Left 07/20/2004   Procedure: CARDIAC CATHETERIZATION; Location: [facility]; Surgeon: Eustace Quail, MD   CARDIAC CATHETERIZATION Left 01/29/2001   Procedure: CARDIAC CATHETERIZATION; Location: [facility]; Surgeon:  Neoma Laming, MD   CATARACT EXTRACTION W/PHACO Right 10/24/2015   Procedure: CATARACT EXTRACTION PHACO AND INTRAOCULAR LENS PLACEMENT ([place]);  Surgeon: [name], MD;  Location: [facility];  Service: Ophthalmology;  Laterality: Right;  [place] 00:52 AP% 23.5 CDE 12.35 fluid pack lot # [id number]   CATARACT EXTRACTION W/PHACO Left 11/14/2015   Procedure: CATARACT EXTRACTION PHACO AND INTRAOCULAR LENS PLACEMENT ([facility]);  Surgeon: [name], MD;  Location: [facility];  Service: Ophthalmology;  Laterality: Left;  [place] 00:55 AP% 19.1 CDE 10.66 fluid pack lot # [id number]   COLONOSCOPY  1987   COLONOSCOPY WITH PROPOFOL N/A 08/22/2017   Procedure: COLONOSCOPY WITH PROPOFOL;  Surgeon: [name], MD;  Location: [facility];  Service: Gastroenterology;  Laterality: N/A;   HERNIA REPAIR     umbilical/ Dr Archie Patten HERNIA REPAIR Right 02/04/2019   Procedure: HERNIA REPAIR INGUINAL WITH MESH;  Surgeon: Olean Ree, MD;  Location: [facility];  Service: General;  Laterality: Right;   IR ANGIOGRAM VISCERAL SELECTIVE  10/29/2018   IR EMBO ART  VEN HEMORR LYMPH EXTRAV  INC GUIDE ROADMAPPING  10/29/2018   IR THORACENTESIS ASP PLEURAL SPACE W/IMG GUIDE  11/23/2018   IR US GUIDE VASC ACCESS RIGHT  10/29/2018   KNEE ARTHROPLASTY Right 09/19/2021   Procedure: COMPUTER ASSISTED TOTAL KNEE ARTHROPLASTY;  Surgeon: Dereck Leep, MD;  Location: [facility];  Service: Orthopedics;  Laterality: Right;   KNEE ARTHROSCOPY Bilateral    POLYPECTOMY  08/22/2017   Procedure: POLYPECTOMY;  Surgeon: [name], MD;  Location: [facility];  Service: Gastroenterology;;   RIGHT/LEFT HEART CATH AND CORONARY ANGIOGRAPHY N/A 06/13/2017   Procedure: Right/Left Heart Cath and Coronary Angiography;  Surgeon: [name], MD;  Location: [facility];  Service: Cardiovascular;  Laterality: N/A;   TONSILLECTOMY      Current Meds  Medication Sig   ACCU-CHEK GUIDE test strip Check blood sugar 1 x  daily   apixaban (ELIQUIS) 2.5 MG TABS tablet Take 1 tablet (2.5 mg total) by mouth 2 (two) times daily.   atorvastatin (LIPITOR) 80 MG tablet Take 1 tablet (80 mg total) by mouth daily.   B COMPLEX VITAMINS PO Take 1 tablet by mouth daily.    calcium carbonate (TUMS - DOSED IN MG ELEMENTAL CALCIUM) 500 MG chewable tablet Chew 1 tablet by mouth as needed for indigestion or heartburn.   Carboxymethylcellul-Glycerin (LUBRICATING EYE DROPS OP) Apply 1 drop to eye as needed (dry eyes).    cholecalciferol (VITAMIN D3) 25 MCG (1000 UNIT) tablet Take 1,000 Units by mouth daily.   COLLAGEN PO Take 1 Scoop by mouth daily.   diclofenac sodium (VOLTAREN) 1 % GEL Apply 4 g topically 4 (four) times daily. To the knee if needed   fluorouracil (EFUDEX) 5 % cream Apply to cheeks and temples twice a day x 7 days as tolerated.   fluticasone (FLONASE) 50 MCG/ACT nasal spray Place 1 spray into  both nostrils daily as needed (congestion).   furosemide (LASIX) 20 MG tablet TAKE 1 TABLET EVERY DAY AS NEEDED   Guaifenesin 1200 MG TB12 Take 1,200 mg by mouth at bedtime.   Potassium 99 MG TABS Take 99 mg by mouth daily. Leg cramps   Tetrahydroz-Dextran-PEG-Povid (EYE DROPS ADVANCED RELIEF OP) Apply 1 drop to eye as needed.    Allergies: Macrolides and ketolides, Norvasc [amlodipine], Avodart [dutasteride], and Tape  Social History   Tobacco Use   Smoking status: Former    Packs/day: 1.50    Years: 20.00    Pack years: 30.00    Types: Cigarettes    Quit date: 68    Years since quitting: 36.1   Smokeless tobacco: Former    Types: Chew    Quit date: 2000   Tobacco comments:    smoking cessation materials not required  Scientific laboratory technician Use: Never used  Substance Use Topics   Alcohol use: No    Alcohol/week: 0.0 standard drinks   Drug use: No    Family History  Problem Relation Age of Onset   Alzheimer's disease Mother    Pulmonary embolism Father    Diabetes Father    Heart attack Father 22    Diabetes Brother    Heart disease Brother        CABG   Heart disease Brother        CABG   Diabetes Brother    Healthy Brother     Review of Systems: A 12-system review of systems was performed and was negative except as noted in the HPI.  --------------------------------------------------------------------------------------------------  Physical Exam: BP 110/66 (BP Location: Left Arm, Patient Position: Sitting, Cuff Size: Large)    Pulse (!) 56    Ht 6' (1.829 m)    Wt 201 lb (91.2 kg)    BMI 27.26 kg/m   General:  NAD. Neck: No JVD or HJR. Lungs: Clear to auscultation bilaterally without wheezes or crackles. Heart: Bradycardic but regular without murmurs, rubs, or gallops. Abdomen: Soft, nontender, nondistended. Extremities: Trace pretibial edema bilaterally.  EKG: Sinus bradycardia with bifascicular block (RBBB and LAFB) and anterior infarct.  No significant change from prior tracing on 07/18/2021.  Lab Results  Component Value Date   WBC 6.3 09/10/2021   HGB 13.8 09/10/2021   HCT 40.6 09/10/2021   MCV 94.4 09/10/2021   PLT 176 09/10/2021    Lab Results  Component Value Date   NA 138 09/10/2021   K 4.1 09/10/2021   CL 102 09/10/2021   CO2 31 09/10/2021   BUN 22 09/10/2021   CREATININE 1.28 (H) 09/10/2021   GLUCOSE 98 09/10/2021   ALT 19 09/10/2021    Lab Results  Component Value Date   CHOL 137 09/06/2020   HDL 49 09/06/2020   LDLCALC 78 09/06/2020   TRIG 40 09/06/2020   CHOLHDL 2.8 09/06/2020    --------------------------------------------------------------------------------------------------  ASSESSMENT AND PLAN: Chronic HFrEF with recovered ejection fraction: Minimal lower extremity edema noted on exam today.  Mr. Scherzer weight is stable.  He is not have any heart failure symptoms as he continues to recover from his knee surgery last year.  He can continue to use furosemide as needed.  We will defer rechallenging him with an ACE inhibitor/ARB and  beta-blocker in the setting of renal insufficiency, borderline low blood pressure at times, and baseline sinus bradycardia.  Paroxysmal atrial fibrillation and frequent PACs: EKG today with sinus bradycardia without ectopy.  No palpitations reported.  Continue apixaban, though we may need to increase dose to 5 mg twice daily if his creatinine remains less than 1.5.  I will check a CBC and CMP today.  Coronary artery disease: No angina reported.  Continue to use as needed Tums for indigestion/heartburn.  Continue secondary prevention including high intensity statin therapy.  Defer aspirin given anticoagulation with apixaban.  We will check CMP and lipid panel today.  Thoracic aortic aneurysm: CTA chest in 07/2021 showed stable moderate enlargement of the ascending aorta, measuring up to 4.5 cm.  We discussed importance of lipid and blood pressure control.  We will plan to order a follow-up CTA HFpEF (if renal function allows) at our next visit in 6 months.  Follow-up: Return to clinic in 6 months.  [name], MD 02/01/2022 8:18 AM

## 2022-02-01 ENCOUNTER — Ambulatory Visit: Payer: Medicare HMO | Admitting: Internal Medicine

## 2022-02-01 ENCOUNTER — Encounter: Payer: Self-pay | Admitting: Internal Medicine

## 2022-02-01 ENCOUNTER — Other Ambulatory Visit: Payer: Self-pay

## 2022-02-01 VITALS — BP 110/66 | HR 56 | Ht 72.0 in | Wt 201.0 lb

## 2022-02-01 DIAGNOSIS — I5022 Chronic systolic (congestive) heart failure: Secondary | ICD-10-CM

## 2022-02-01 DIAGNOSIS — I480 Paroxysmal atrial fibrillation: Secondary | ICD-10-CM | POA: Diagnosis not present

## 2022-02-01 DIAGNOSIS — I2510 Atherosclerotic heart disease of native coronary artery without angina pectoris: Secondary | ICD-10-CM | POA: Diagnosis not present

## 2022-02-01 DIAGNOSIS — I712 Thoracic aortic aneurysm, without rupture, unspecified: Secondary | ICD-10-CM | POA: Diagnosis not present

## 2022-02-01 MED ORDER — APIXABAN 2.5 MG PO TABS
2.5000 mg | ORAL_TABLET | Freq: Two times a day (BID) | ORAL | 0 refills | Status: DC
Start: 2022-02-01 — End: 2022-08-08

## 2022-02-01 NOTE — Patient Instructions (Signed)
Medication Instructions:   Your physician recommends that you continue on your current medications as directed. Please refer to the Current Medication list given to you today.  *If you need a refill on your cardiac medications before your next appointment, please call your pharmacy*   Lab Work:  Today: CBC, Lipid panel, CMET  If you have labs (blood work) drawn today and your tests are completely normal, you will receive your results only by: [place] (if you have MyChart) OR A paper copy in the mail If you have any lab test that is abnormal or we need to change your treatment, we will call you to review the results.   Testing/Procedures:  None ordered   Follow-Up: At [facility], you and your health needs are our priority.  As part of our continuing mission to provide you with exceptional heart care, we have created designated Provider Care Teams.  These Care Teams include your primary Cardiologist (physician) and Advanced Practice Providers (APPs -  Physician Assistants and Nurse Practitioners) who all work together to provide you with the care you need, when you need it.  We recommend signing up for the patient portal called "MyChart".  Sign up information is provided on this After Visit Summary.  MyChart is used to connect with patients for Virtual Visits (Telemedicine).  Patients are able to view lab/test results, encounter notes, upcoming appointments, etc.  Non-urgent messages can be sent to your provider as well.   To learn more about what you can do with MyChart, go to [email or web address].    Your next appointment:   6 month(s)  The format for your next appointment:   In Person  Provider:   You may see [name], MD or one of the following Advanced Practice Providers on your designated Care Team:   Murray Hodgkins, NP Christell Faith, PA-C Cadence Kathlen Mody, [place]

## 2022-02-02 LAB — LIPID PANEL
Chol/HDL Ratio: 2.3 ratio (ref 0.0–5.0)
Cholesterol, Total: 102 mg/dL (ref 100–199)
HDL: 44 mg/dL (ref >39)
LDL Chol Calc (NIH): 46 mg/dL (ref 0–99)
Triglycerides: 47 mg/dL (ref 0–149)
VLDL Cholesterol Cal: 12 mg/dL (ref 5–40)

## 2022-02-02 LAB — COMPREHENSIVE METABOLIC PANEL
ALT: 20 IU/L (ref 0–44)
AST: 25 IU/L (ref 0–40)
Albumin/Globulin Ratio: 1.9 (ref 1.2–2.2)
Albumin: 4.3 g/dL (ref 3.6–4.6)
Alkaline Phosphatase: 76 IU/L (ref 44–121)
BUN/Creatinine Ratio: 14 (ref 10–24)
BUN: 19 mg/dL (ref 8–27)
Bilirubin Total: 1.5 mg/dL — ABNORMAL HIGH (ref 0.0–1.2)
CO2: 28 mmol/L (ref 20–29)
Calcium: 9 mg/dL (ref 8.6–10.2)
Chloride: 102 mmol/L (ref 96–106)
Creatinine, Ser: 1.32 mg/dL — ABNORMAL HIGH (ref 0.76–1.27)
Globulin, Total: 2.3 g/dL (ref 1.5–4.5)
Glucose: 104 mg/dL — ABNORMAL HIGH (ref 70–99)
Potassium: 4.5 mmol/L (ref 3.5–5.2)
Sodium: 142 mmol/L (ref 134–144)
Total Protein: 6.6 g/dL (ref 6.0–8.5)
eGFR: 53 mL/min/{1.73_m2} — ABNORMAL LOW (ref >59)

## 2022-02-02 LAB — CBC
Hematocrit: 39.5 % (ref 37.5–51.0)
Hemoglobin: 13.3 g/dL (ref 13.0–17.7)
MCH: 29.7 pg (ref 26.6–33.0)
MCHC: 33.7 g/dL (ref 31.5–35.7)
MCV: 88 fL (ref 79–97)
Platelets: 203 10*3/uL (ref 150–450)
RBC: 4.48 x10E6/uL (ref 4.14–5.80)
RDW: 13.2 % (ref 11.6–15.4)
WBC: 6.8 10*3/uL (ref 3.4–10.8)

## 2022-03-01 ENCOUNTER — Other Ambulatory Visit: Payer: Self-pay | Admitting: Internal Medicine

## 2022-05-02 DIAGNOSIS — T887XXA Unspecified adverse effect of drug or medicament, initial encounter: Secondary | ICD-10-CM | POA: Diagnosis not present

## 2022-05-02 DIAGNOSIS — S20361A Insect bite (nonvenomous) of right front wall of thorax, initial encounter: Secondary | ICD-10-CM | POA: Diagnosis not present

## 2022-05-02 DIAGNOSIS — S40861A Insect bite (nonvenomous) of right upper arm, initial encounter: Secondary | ICD-10-CM | POA: Diagnosis not present

## 2022-05-02 DIAGNOSIS — S80862A Insect bite (nonvenomous), left lower leg, initial encounter: Secondary | ICD-10-CM | POA: Diagnosis not present

## 2022-05-02 DIAGNOSIS — S80861A Insect bite (nonvenomous), right lower leg, initial encounter: Secondary | ICD-10-CM | POA: Diagnosis not present

## 2022-05-24 ENCOUNTER — Encounter: Payer: Self-pay | Admitting: Family Medicine

## 2022-05-24 ENCOUNTER — Ambulatory Visit (INDEPENDENT_AMBULATORY_CARE_PROVIDER_SITE_OTHER): Payer: Medicare HMO | Admitting: Family Medicine

## 2022-05-24 VITALS — BP 120/64 | HR 62 | Ht 72.0 in | Wt 192.6 lb

## 2022-05-24 DIAGNOSIS — E785 Hyperlipidemia, unspecified: Secondary | ICD-10-CM

## 2022-05-24 DIAGNOSIS — I700 Atherosclerosis of aorta: Secondary | ICD-10-CM

## 2022-05-24 DIAGNOSIS — N183 Chronic kidney disease, stage 3 unspecified: Secondary | ICD-10-CM | POA: Diagnosis not present

## 2022-05-24 DIAGNOSIS — R7303 Prediabetes: Secondary | ICD-10-CM

## 2022-05-24 DIAGNOSIS — I712 Thoracic aortic aneurysm, without rupture, unspecified: Secondary | ICD-10-CM | POA: Diagnosis not present

## 2022-05-24 DIAGNOSIS — I480 Paroxysmal atrial fibrillation: Secondary | ICD-10-CM

## 2022-05-24 DIAGNOSIS — Z0000 Encounter for general adult medical examination without abnormal findings: Secondary | ICD-10-CM | POA: Diagnosis not present

## 2022-05-24 DIAGNOSIS — R351 Nocturia: Secondary | ICD-10-CM | POA: Diagnosis not present

## 2022-05-24 DIAGNOSIS — M170 Bilateral primary osteoarthritis of knee: Secondary | ICD-10-CM | POA: Diagnosis not present

## 2022-05-24 DIAGNOSIS — F330 Major depressive disorder, recurrent, mild: Secondary | ICD-10-CM

## 2022-05-24 DIAGNOSIS — R413 Other amnesia: Secondary | ICD-10-CM

## 2022-05-24 NOTE — Progress Notes (Signed)
Subjective:    Patient ID: Anthony Mosley, male    DOB: 09/15/1935, 87 y.o.   MRN: [id number]  Anthony Mosley is a 87 y.o. male presenting on 05/24/2022 for Annual Exam and Insect Bite   HPI  Here for Annual Physical and Lab Orders today  Osteoarthritis Bilateral   Chronic Knee Pain, bilateral lower ext S/p R knee TKR =- Dr Cecil Cranker Improved overall after completing physical therapy post op   CKD-III Stable, previous issue. Last lab shows improved, Cr 1.28 (08/2021)   Pre-Diabetes A1c in past 5.4 to 5.7, last 5.6 (05/2021) He checks CBG occasional, 1 x  per week. Usually controlled < 100 fasting.   Major depression recurrent, mild Currently not on therapy Mood is stable, see below  Insomnia He will go to bed at 10pm will wake up at 12 midnight, often wake up multiple times at night.   Aortic Atherosclerosis On imaging  PAF Oon Anticoagulation   Allergic Reaction vs Insect Bite Treated for multiple axillary and chest wall / groin rash, itchy. He was given Prednisone, Hydroxyzine, Triamcinolone 0.1%   Health Maintenance: Due for Shingrix if interested.     05/24/2022    8:18 AM 11/21/2021    1:16 PM 08/21/2021    9:05 AM  Depression screen PHQ 2/9  Decreased Interest 0 0 0  Down, Depressed, Hopeless 3 0 0  PHQ - 2 Score 3 0 0  Altered sleeping 3 0   Tired, decreased energy 0 0   Change in appetite 0 0   Feeling bad or failure about yourself  0 0   Trouble concentrating 2 0   Moving slowly or fidgety/restless 2 0   Suicidal thoughts 0 0   PHQ-9 Score 10 0   Difficult doing work/chores Not difficult at all Not difficult at all     Past Medical History:  Diagnosis Date   Aortic atherosclerosis (HCC)    Aortic root dilatation (HCC)    a.) TTE 05/29/2017 --> measured 28 mm. 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 seen on CT.   CAD (coronary artery disease)    a.  Elective R/LHC 06/12/18 showed mild-moderate nonobstructive RCA CAD with mild LAD myocardial bridging but no significant obstructive CAD; there was mildly elevated L heart filling pressure, moderately elevated R heart filling pressure, and mild pulm HTN.   Chronic anticoagulation    Apixaban   Chronic knee pain    CKD (chronic kidney disease), stage III (HCC)    Cough    DJD (degenerative joint disease)    Erectile dysfunction    Former tobacco use    GERD (gastroesophageal reflux disease)    occ-tums prn   Hemorrhoids    HFrEF (heart failure with reduced ejection fraction) (HCC)    Hyperbilirubinemia    Hyperlipidemia    Mild aortic insufficiency    Mild pulmonary hypertension (HCC)    Moderate mitral regurgitation    NICM (nonischemic cardiomyopathy) ([place])    a. EF 40-45% by echo 05/2017, improved to 45-50% by echo 10/2020.   PAC (premature atrial contraction)    Paroxysmal A-fib (HCC)    Pleural effusion, left 11/2018   Pneumonia    Prolonged QT syndrome    RBBB  Rosacea    Ruptured spleen    Skin cancer    Left wrist - tx by Dr Koleen Nimrod in the past.   Squamous cell carcinoma of skin 11/27/2018   L inf mandible    Thoracic aortic aneurysm ([place])    a.) TTE 05/29/2017 --> 4.2 cm . b.) CT 12/2017 --> measured 4.4 cm, imaged incompletely at 4.3cm in 10/2018 CT. c.) CTA 07/27/2021 --> measured 4.5 cm.   Varicocele    Wears dentures    partial upper   Wheezing    Past Surgical History:  Procedure Laterality Date   ABDOMINAL SURGERY     due to ruptured spleen   APPENDECTOMY     Dr Emilio Math   CARDIAC CATHETERIZATION Left 07/20/2004   Procedure: CARDIAC CATHETERIZATION; Location: [facility]; Surgeon: Eustace Quail, MD   CARDIAC CATHETERIZATION Left 01/29/2001   Procedure: CARDIAC CATHETERIZATION; Location: [place]; Surgeon: Neoma Laming, MD   CATARACT EXTRACTION W/PHACO Right 10/24/2015    Procedure: CATARACT EXTRACTION PHACO AND INTRAOCULAR LENS PLACEMENT ([place]);  Surgeon: [name], MD;  Location: [facility];  Service: Ophthalmology;  Laterality: Right;  [place] 00:52 AP% 23.5 CDE 12.35 fluid pack lot # [id number]   CATARACT EXTRACTION W/PHACO Left 11/14/2015   Procedure: CATARACT EXTRACTION PHACO AND INTRAOCULAR LENS PLACEMENT ([facility]);  Surgeon: [name], MD;  Location: [facility];  Service: Ophthalmology;  Laterality: Left;  [place] 00:55 AP% 19.1 CDE 10.66 fluid pack lot # [id number]   COLONOSCOPY  1987   COLONOSCOPY WITH PROPOFOL N/A 08/22/2017   Procedure: COLONOSCOPY WITH PROPOFOL;  Surgeon: [name], MD;  Location: [place];  Service: Gastroenterology;  Laterality: N/A;   HERNIA REPAIR     umbilical/ Dr Archie Patten HERNIA REPAIR Right 02/04/2019   Procedure: HERNIA REPAIR INGUINAL WITH MESH;  Surgeon: Olean Ree, MD;  Location: [facility];  Service: General;  Laterality: Right;   IR ANGIOGRAM VISCERAL SELECTIVE  10/29/2018   IR EMBO ART  VEN HEMORR LYMPH EXTRAV  INC GUIDE ROADMAPPING  10/29/2018   IR THORACENTESIS ASP PLEURAL SPACE W/IMG GUIDE  11/23/2018   IR US GUIDE VASC ACCESS RIGHT  10/29/2018   KNEE ARTHROPLASTY Right 09/19/2021   Procedure: COMPUTER ASSISTED TOTAL KNEE ARTHROPLASTY;  Surgeon: Dereck Leep, MD;  Location: [facility];  Service: Orthopedics;  Laterality: Right;   KNEE ARTHROSCOPY Bilateral    POLYPECTOMY  08/22/2017   Procedure: POLYPECTOMY;  Surgeon: [name], MD;  Location: [place];  Service: Gastroenterology;;   RIGHT/LEFT HEART CATH AND CORONARY ANGIOGRAPHY N/A 06/13/2017   Procedure: Right/Left Heart Cath and Coronary Angiography;  Surgeon: [name], MD;  Location: [facility];  Service: Cardiovascular;  Laterality: N/A;   TONSILLECTOMY     Social History   Socioeconomic History   Marital status: Married    Spouse name: Jeanett Schlein   Number of children: 2   Years of education: Not on file    Highest education level: 7th grade  Occupational History   Occupation: Retired  Tobacco Use   Smoking status: Former    Packs/day: 1.50    Years: 20.00    Total pack years: 30.00    Types: Cigarettes    Quit date: 1987    Years since quitting: 36.4   Smokeless tobacco: Former    Types: Chew    Quit date: 2000   Tobacco comments:    smoking cessation materials not required  Vaping Use   Vaping Use: Never used  Substance and Sexual Activity  Alcohol use: No    Alcohol/week: 0.0 standard drinks of alcohol   Drug use: No   Sexual activity: Never  Other Topics Concern   Not on file  Social History Narrative   Not on file   Social Determinants of Health   Financial Resource Strain: Low Risk  (08/21/2021)   Overall Financial Resource Strain (CARDIA)    Difficulty of Paying Living Expenses: Not hard at all  Food Insecurity: No Food Insecurity (08/21/2021)   Hunger Vital Sign    Worried About Running Out of Food in the Last Year: Never true    Fries in the Last Year: Never true  Transportation Needs: No Transportation Needs (08/21/2021)   PRAPARE - Hydrologist (Medical): No    Lack of Transportation (Non-Medical): No  Physical Activity: Inactive (08/21/2021)   Exercise Vital Sign    Days of Exercise per Week: 0 days    Minutes of Exercise per Session: 0 min  Stress: No Stress Concern Present (08/21/2021)   [place]    Feeling of Stress : Not at all  Social Connections: Unknown (08/08/2020)   Social Connection and Isolation Panel [NHANES]    Frequency of Communication with Friends and Family: Patient refused    Frequency of Social Gatherings with Friends and Family: Patient refused    Attends Religious Services: Patient refused    Active Member of Clubs or Organizations: Patient refused    Attends Archivist Meetings: Patient refused    Marital Status: Married   Human resources officer Violence: Not At Risk (08/08/2020)   Humiliation, Afraid, Rape, and Kick questionnaire    Fear of Current or Ex-Partner: No    Emotionally Abused: No    Physically Abused: No    Sexually Abused: No   Family History  Problem Relation Age of Onset   Alzheimer's disease Mother    Pulmonary embolism Father    Diabetes Father    Heart attack Father 19   Diabetes Brother    Heart disease Brother        CABG   Heart disease Brother        CABG   Diabetes Brother    Healthy Brother    Current Outpatient Medications on File Prior to Visit  Medication Sig   ACCU-CHEK GUIDE test strip Check blood sugar 1 x daily   apixaban (ELIQUIS) 2.5 MG TABS tablet Take 1 tablet (2.5 mg total) by mouth 2 (two) times daily.   atorvastatin (LIPITOR) 80 MG tablet TAKE 1 TABLET EVERY DAY   B COMPLEX VITAMINS PO Take 1 tablet by mouth daily.    calcium carbonate (TUMS - DOSED IN MG ELEMENTAL CALCIUM) 500 MG chewable tablet Chew 1 tablet by mouth as needed for indigestion or heartburn.   Carboxymethylcellul-Glycerin (LUBRICATING EYE DROPS OP) Apply 1 drop to eye as needed (dry eyes).    cholecalciferol (VITAMIN D3) 25 MCG (1000 UNIT) tablet Take 1,000 Units by mouth daily.   COLLAGEN PO Take 1 Scoop by mouth daily.   diclofenac sodium (VOLTAREN) 1 % GEL Apply 4 g topically 4 (four) times daily. To the knee if needed   fluorouracil (EFUDEX) 5 % cream Apply to cheeks and temples twice a day x 7 days as tolerated.   fluticasone (FLONASE) 50 MCG/ACT nasal spray Place 1 spray into both nostrils daily as needed (congestion).   furosemide (LASIX) 20 MG tablet TAKE 1 TABLET EVERY  DAY AS NEEDED   Guaifenesin 1200 MG TB12 Take 1,200 mg by mouth at bedtime.   Potassium 99 MG TABS Take 99 mg by mouth daily. Leg cramps   Tetrahydroz-Dextran-PEG-Povid (EYE DROPS ADVANCED RELIEF OP) Apply 1 drop to eye as needed.   celecoxib (CELEBREX) 200 MG capsule Take 1 capsule (200 mg total) by mouth 2 (two) times  daily. (Patient not taking: Reported on 02/01/2022)   No current facility-administered medications on file prior to visit.    Review of Systems  Constitutional:  Negative for activity change, appetite change, chills, diaphoresis, fatigue and fever.  HENT:  Negative for congestion and hearing loss.   Eyes:  Negative for visual disturbance.  Respiratory:  Negative for cough, chest tightness, shortness of breath and wheezing.   Cardiovascular:  Negative for chest pain, palpitations and leg swelling.  Gastrointestinal:  Negative for abdominal pain, constipation, diarrhea, nausea and vomiting.  Genitourinary:  Negative for dysuria, frequency and hematuria.  Musculoskeletal:  Negative for arthralgias and neck pain.  Skin:  Negative for rash.  Neurological:  Negative for dizziness, weakness, light-headedness, numbness and headaches.  Hematological:  Negative for adenopathy.  Psychiatric/Behavioral:  Negative for behavioral problems, dysphoric mood and sleep disturbance.    Per HPI unless specifically indicated above     Objective:    BP 120/64   Pulse 62   Ht 6' (1.829 m)   Wt 192 lb 9.6 oz (87.4 kg)   SpO2 100%   BMI 26.12 kg/m   Wt Readings from Last 3 Encounters:  05/24/22 192 lb 9.6 oz (87.4 kg)  02/01/22 201 lb (91.2 kg)  11/21/21 202 lb (91.6 kg)    Physical Exam Vitals and nursing note reviewed.  Constitutional:      General: He is not in acute distress.    Appearance: He is well-developed. He is not diaphoretic.     Comments: Well-appearing, comfortable, cooperative  HENT:     Head: Normocephalic and atraumatic.  Eyes:     General:        Right eye: No discharge.        Left eye: No discharge.     Conjunctiva/sclera: Conjunctivae normal.     Pupils: Pupils are equal, round, and reactive to light.  Neck:     Thyroid: No thyromegaly.  Cardiovascular:     Rate and Rhythm: Normal rate and regular rhythm.     Pulses: Normal pulses.     Heart sounds: Normal heart  sounds. No murmur heard. Pulmonary:     Effort: Pulmonary effort is normal. No respiratory distress.     Breath sounds: Normal breath sounds. No wheezing or rales.  Abdominal:     General: Bowel sounds are normal. There is no distension.     Palpations: Abdomen is soft. There is no mass.     Tenderness: There is no abdominal tenderness.  Musculoskeletal:        General: No tenderness. Normal range of motion.     Cervical back: Normal range of motion and neck supple.     Comments: Upper / Lower Extremities: - Normal muscle tone, strength bilateral upper extremities 5/5, lower extremities 5/5  Lymphadenopathy:     Cervical: No cervical adenopathy.  Skin:    General: Skin is warm and dry.     Findings: No erythema or rash.  Neurological:     Mental Status: He is alert and oriented to person, place, and time.     Comments: Distal sensation intact to light  touch all extremities  Psychiatric:        Mood and Affect: Mood normal.        Behavior: Behavior normal.        Thought Content: Thought content normal.     Comments: Well groomed, good eye contact, normal speech and thoughts      Results for orders placed or performed in visit on 02/01/22  Comp Met (CMET)  Result Value Ref Range   Glucose 104 (H) 70 - 99 mg/dL   BUN 19 8 - 27 mg/dL   Creatinine, Ser 1.32 (H) 0.76 - 1.27 mg/dL   eGFR 53 (L) >59 mL/min/1.73   BUN/Creatinine Ratio 14 10 - 24   Sodium 142 134 - 144 mmol/L   Potassium 4.5 3.5 - 5.2 mmol/L   Chloride 102 96 - 106 mmol/L   CO2 28 20 - 29 mmol/L   Calcium 9.0 8.6 - 10.2 mg/dL   Total Protein 6.6 6.0 - 8.5 g/dL   Albumin 4.3 3.6 - 4.6 g/dL   Globulin, Total 2.3 1.5 - 4.5 g/dL   Albumin/Globulin Ratio 1.9 1.2 - 2.2   Bilirubin Total 1.5 (H) 0.0 - 1.2 mg/dL   Alkaline Phosphatase 76 44 - 121 IU/L   AST 25 0 - 40 IU/L   ALT 20 0 - 44 IU/L  Lipid Profile  Result Value Ref Range   Cholesterol, Total 102 100 - 199 mg/dL   Triglycerides 47 0 - 149 mg/dL   HDL  44 >39 mg/dL   VLDL Cholesterol Cal 12 5 - 40 mg/dL   LDL Chol Calc (NIH) 46 0 - 99 mg/dL   Chol/HDL Ratio 2.3 0.0 - 5.0 ratio  CBC  Result Value Ref Range   WBC 6.8 3.4 - 10.8 x10E3/uL   RBC 4.48 4.14 - 5.80 x10E6/uL   Hemoglobin 13.3 13.0 - 17.7 g/dL   Hematocrit 39.5 37.5 - 51.0 %   MCV 88 79 - 97 fL   MCH 29.7 26.6 - 33.0 pg   MCHC 33.7 31.5 - 35.7 g/dL   RDW 13.2 11.6 - 15.4 %   Platelets 203 150 - 450 x10E3/uL      Assessment & Plan:   Problem List Items Addressed This Visit     Thoracic aortic aneurysm without rupture ([place])    Follow w/ Vasculr on imaging for aneurysm.      Stage 3 chronic kidney disease (HCC)    Stable CKD Check lab Encourage continue monitor Avoid nephrotoxic meds, improve hydration      Relevant Orders   COMPLETE METABOLIC PANEL WITH GFR   CBC with Differential/Platelet   PAF (paroxysmal atrial fibrillation) (HCC)    On anticoagulation      Relevant Orders   CBC with Differential/Platelet   Mild recurrent major depression (HCC)    No acute concern Remains stable Some insomnia contributing      Dyslipidemia    Continue statin therapy Check lipid      Relevant Orders   Lipid panel   Arthritis of knee, degenerative   Relevant Orders   CBC with Differential/Platelet   Other Visit Diagnoses     Annual physical exam    -  Primary   Relevant Orders   COMPLETE METABOLIC PANEL WITH GFR   CBC with Differential/Platelet   Lipid panel   Pre-diabetes       Relevant Orders   Hemoglobin A1c   Aortic atherosclerosis ([place])       Relevant Orders  Lipid panel   Nocturia       Relevant Orders   PSA   Memory loss           Updated Health Maintenance information Due for fasting labs today Encouraged improvement to lifestyle with diet and exercise Goal of weight loss   Future Shingrix Vaccine at the pharmacy, covered by Medicare, 2 doses, 2-6 months apart. Good to prevent shingles or reduce symptoms.  Prevagen - memory  supplement, if interested to take. Trial for 3 months if you like.  Start Melatonin supplement 67m and then go up to 3 mg then max is 561mnightly  No orders of the defined types were placed in this encounter.     Follow up plan: Return in about 6 months (around 11/23/2022) for 6 month follow-up Memory / Insomnia.  AlNobie PutnamDOGr[facility]elled[facility] 05/24/2022, 8:25 AM

## 2022-05-24 NOTE — Assessment & Plan Note (Signed)
No acute concern Remains stable Some insomnia contributing

## 2022-05-24 NOTE — Patient Instructions (Addendum)
Thank you for coming to the office today.  Future Shingrix Vaccine at the pharmacy, covered by Medicare, 2 doses, 2-6 months apart. Good to prevent shingles or reduce symptoms.  Prevagen - memory supplement, if interested to take. Trial for 3 months if you like.  Start Melatonin supplement '1mg'$  and then go up to 3 mg then max is '5mg'$  nightly  Sleep Hygiene Recommendations to promote healthy sleep in all patients, especially if symptoms of insomnia are worsening. Due to the nature of sleep rhythms, if your body gets "out of rhythm", it may take some time before your sleep cycle can be "reset".  Please try to follow as many of the following tips as you can, usually there are only a few of these are the primary cause of the problem.  ?To reset your sleep rhythm, go to bed and get up at the same time every day ?Sleep only long enough to feel rested and then get out of bed ?Do not try to force yourself to sleep. If you can't sleep, get out of bed and try again later. ?Avoid naps during the day, unless excessively tired. The more sleeping during the day, then the less sleep your body needs at night.  ?Have coffee, tea, and other foods that have caffeine only in the morning ?Exercise several days a week, but not right before bed ?If you drink alcohol, prefer to have appropriate drink with one meal, but prefer to avoid alcohol in the evening, and bedtime ?If you smoke, avoid smoking, especially in the evening  ?Avoid watching TV or looking at phones, computers, or reading devices ("e-books") that give off light at least 30 minutes before bed. This artificial light sends "awake signals" to your brain and can make it harder to fall asleep. ?Make your bedroom a comfortable place where it is easy to fall asleep: Put up shades or special blackout curtains to block light from outside. Use a white noise machine to block noise. Keep the temperature cool. ?Try your best to solve or at least address your  problems before you go to bed ?Use relaxation techniques to manage stress. Ask your health care provider to suggest some techniques that may work well for you. These may include: Breathing exercises. Routines to release muscle tension. Visualizing peaceful scenes.    Please schedule a Follow-up Appointment to: Return in about 6 months (around 11/23/2022) for 6 month follow-up Memory / Insomnia.  If you have any other questions or concerns, please feel free to call the office or send a message through [place]. You may also schedule an earlier appointment if necessary.  Additionally, you may be receiving a survey about your experience at our office within a few days to 1 week by e-mail or mail. We value your feedback.  Nobie Putnam, DO [place]

## 2022-05-24 NOTE — Assessment & Plan Note (Signed)
Follow w/ Vasculr on imaging for aneurysm.

## 2022-05-24 NOTE — Assessment & Plan Note (Signed)
Continue statin therapy Check lipid

## 2022-05-24 NOTE — Assessment & Plan Note (Signed)
On anticoagulation 

## 2022-05-24 NOTE — Assessment & Plan Note (Signed)
Stable CKD Check lab Encourage continue monitor Avoid nephrotoxic meds, improve hydration

## 2022-05-25 LAB — COMPLETE METABOLIC PANEL WITH GFR
AG Ratio: 1.9 (calc) (ref 1.0–2.5)
ALT: 14 U/L (ref 9–46)
AST: 20 U/L (ref 10–35)
Albumin: 4.2 g/dL (ref 3.6–5.1)
Alkaline phosphatase (APISO): 59 U/L (ref 35–144)
BUN/Creatinine Ratio: 15 (calc) (ref 6–22)
BUN: 22 mg/dL (ref 7–25)
CO2: 29 mmol/L (ref 20–32)
Calcium: 9.2 mg/dL (ref 8.6–10.3)
Chloride: 102 mmol/L (ref 98–110)
Creat: 1.42 mg/dL — ABNORMAL HIGH (ref 0.70–1.22)
Globulin: 2.2 g/dL (calc) (ref 1.9–3.7)
Glucose, Bld: 97 mg/dL (ref 65–99)
Potassium: 4.1 mmol/L (ref 3.5–5.3)
Sodium: 139 mmol/L (ref 135–146)
Total Bilirubin: 1.9 mg/dL — ABNORMAL HIGH (ref 0.2–1.2)
Total Protein: 6.4 g/dL (ref 6.1–8.1)
eGFR: 49 mL/min/{1.73_m2} — ABNORMAL LOW (ref >=60)

## 2022-05-25 LAB — CBC WITH DIFFERENTIAL/PLATELET
Absolute Monocytes: 515 cells/uL (ref 200–950)
Basophils Absolute: 62 cells/uL (ref 0–200)
Basophils Relative: 1 %
Eosinophils Absolute: 130 cells/uL (ref 15–500)
Eosinophils Relative: 2.1 %
HCT: 40.9 % (ref 38.5–50.0)
Hemoglobin: 13.6 g/dL (ref 13.2–17.1)
Lymphs Abs: 2096 cells/uL (ref 850–3900)
MCH: 30.7 pg (ref 27.0–33.0)
MCHC: 33.3 g/dL (ref 32.0–36.0)
MCV: 92.3 fL (ref 80.0–100.0)
MPV: 10.4 fL (ref 7.5–12.5)
Monocytes Relative: 8.3 %
Neutro Abs: 3398 cells/uL (ref 1500–7800)
Neutrophils Relative %: 54.8 %
Platelets: 191 10*3/uL (ref 140–400)
RBC: 4.43 10*6/uL (ref 4.20–5.80)
RDW: 12.8 % (ref 11.0–15.0)
Total Lymphocyte: 33.8 %
WBC: 6.2 10*3/uL (ref 3.8–10.8)

## 2022-05-25 LAB — HEMOGLOBIN A1C
Hgb A1c MFr Bld: 5.9 % of total Hgb — ABNORMAL HIGH (ref <5.7)
Mean Plasma Glucose: 123 mg/dL
eAG (mmol/L): 6.8 mmol/L

## 2022-05-25 LAB — LIPID PANEL
Cholesterol: 111 mg/dL (ref <200)
HDL: 54 mg/dL (ref >=40)
LDL Cholesterol (Calc): 44 mg/dL (calc)
Non-HDL Cholesterol (Calc): 57 mg/dL (calc) (ref <130)
Total CHOL/HDL Ratio: 2.1 (calc) (ref <5.0)
Triglycerides: 54 mg/dL (ref <150)

## 2022-05-25 LAB — PSA: PSA: 0.81 ng/mL (ref <=4.00)

## 2022-06-04 NOTE — Telephone Encounter (Signed)
Annual chest CTA ordered for TAA. Called pt to give phone number to [facility]. Pt will call to schedule in August. Last CTA was 07/27/21, advised pt may want to schedule after 07/27/22 to make sure it has been a year. Pt voiced understanding and has no further questions.

## 2022-06-04 NOTE — Addendum Note (Signed)
Addended by: Darlyne Russian on: 06/04/2022 11:12 AM   Modules accepted: Orders

## 2022-07-29 ENCOUNTER — Ambulatory Visit
Admission: RE | Admit: 2022-07-29 | Discharge: 2022-07-29 | Disposition: A | Discharge status: Home / Self Care | Payer: Medicare HMO | Source: Ambulatory Visit | Attending: Internal Medicine | Admitting: Internal Medicine

## 2022-07-29 DIAGNOSIS — I7121 Aneurysm of the ascending aorta, without rupture: Secondary | ICD-10-CM | POA: Diagnosis not present

## 2022-07-29 DIAGNOSIS — I712 Thoracic aortic aneurysm, without rupture, unspecified: Secondary | ICD-10-CM | POA: Insufficient documentation

## 2022-07-29 LAB — POCT I-STAT CREATININE: Creatinine, Ser: 1.5 mg/dL — ABNORMAL HIGH (ref 0.61–1.24)

## 2022-07-29 MED ORDER — IOHEXOL 350 MG/ML SOLN
65.0000 mL | Freq: Once | INTRAVENOUS | Status: AC | PRN
Start: 2022-07-29 — End: 2022-07-29
  Administered 2022-07-29: 65 mL via INTRAVENOUS

## 2022-07-30 ENCOUNTER — Ambulatory Visit: Payer: Medicare HMO | Admitting: Dermatology

## 2022-07-30 DIAGNOSIS — L570 Actinic keratosis: Secondary | ICD-10-CM | POA: Diagnosis not present

## 2022-07-30 DIAGNOSIS — D692 Other nonthrombocytopenic purpura: Secondary | ICD-10-CM

## 2022-07-30 DIAGNOSIS — D1800 Hemangioma unspecified site: Secondary | ICD-10-CM

## 2022-07-30 DIAGNOSIS — Z1283 Encounter for screening for malignant neoplasm of skin: Secondary | ICD-10-CM

## 2022-07-30 DIAGNOSIS — L821 Other seborrheic keratosis: Secondary | ICD-10-CM

## 2022-07-30 DIAGNOSIS — L719 Rosacea, unspecified: Secondary | ICD-10-CM | POA: Diagnosis not present

## 2022-07-30 DIAGNOSIS — Z85828 Personal history of other malignant neoplasm of skin: Secondary | ICD-10-CM

## 2022-07-30 DIAGNOSIS — L820 Inflamed seborrheic keratosis: Secondary | ICD-10-CM | POA: Diagnosis not present

## 2022-07-30 DIAGNOSIS — L578 Other skin changes due to chronic exposure to nonionizing radiation: Secondary | ICD-10-CM

## 2022-07-30 DIAGNOSIS — L814 Other melanin hyperpigmentation: Secondary | ICD-10-CM | POA: Diagnosis not present

## 2022-07-30 MED ORDER — METRONIDAZOLE 0.75 % EX GEL: CUTANEOUS | 3 refills | Status: DC

## 2022-07-30 NOTE — Progress Notes (Signed)
Follow-Up Visit   Subjective  Anthony Mosley is a 87 y.o. male who presents for the following: Follow-up.  The patient presents for 6 month follow-up and Upper Body Skin Exam (UBSE) for skin cancer screening and mole check.  The patient has spots, moles and lesions to be evaluated, some may be new or changing. He treated his cheeks and temples with 5FU/Calcipotriene cream after last visit with a good reaction. He has a history of SCC of the left inferior mandible. Recheck AK treated on the right neck ant mandible. He also has an itchy spot on his back and a dark spot on the right sideburn. Spot on sideburn not bothersome.  The following portions of the chart were reviewed this encounter and updated as appropriate:       Review of Systems:  No other skin or systemic complaints except as noted in HPI or Assessment and Plan.  Objective  Well appearing patient in no apparent distress; mood and affect are within normal limits.  All skin waist up examined.  R lat mid back x 2 (2) Erythematous stuck-on, waxy papule or plaque  face Erythema with telangiectasias of the nose and malar cheeks.  vertex scalp x 6, frontal scalp x 2, L mandible x 1 (9) Pink scaly macules.  AK R neck at mandible is clear    Assessment & Plan  Skin cancer screening performed today.  Actinic Damage - Severe, confluent actinic changes with pre-cancerous actinic keratoses  - Severe, chronic, not at goal, secondary to cumulative UV radiation exposure over time - diffuse scaly erythematous macules and papules with underlying dyspigmentation - Discussed Prescription "Field Treatment" for Severe, Chronic Confluent Actinic Changes with Pre-Cancerous Actinic Keratoses Field treatment involves treatment of an entire area of skin that has confluent Actinic Changes (Sun/ Ultraviolet light damage) and PreCancerous Actinic Keratoses by method of PhotoDynamic Therapy (PDT) and/or prescription Topical Chemotherapy agents  such as 5-fluorouracil, 5-fluorouracil/calcipotriene, and/or imiquimod.  The purpose is to decrease the number of clinically evident and subclinical PreCancerous lesions to prevent progression to development of skin cancer by chemically destroying early precancer changes that may or may not be visible.  It has been shown to reduce the risk of developing skin cancer in the treated area. As a result of treatment, redness, scaling, crusting, and open sores may occur during treatment course. One or more than one of these methods may be used and may have to be used several times to control, suppress and eliminate the PreCancerous changes. Discussed treatment course, expected reaction, and possible side effects. - Recommend daily broad spectrum sunscreen SPF 30+ to sun-exposed areas, reapply every 2 hours as needed.  - Staying in the shade or wearing long sleeves, sun glasses (UVA+UVB protection) and wide brim hats (4-inch brim around the entire circumference of the hat) are also recommended. - Call for new or changing lesions.  Seborrheic Keratoses - Stuck-on, waxy, tan-brown papules and/or plaques, including right sideburn  - Benign-appearing - Discussed benign etiology and prognosis. - Observe - Call for any changes  Lentigines - Scattered tan macules - Due to sun exposure - Benign-appering, observe - Recommend daily broad spectrum sunscreen SPF 30+ to sun-exposed areas, reapply every 2 hours as needed. - Call for any changes  Purpura - Chronic; persistent and recurrent.  Treatable, but not curable. - Violaceous macules and patches - Benign - Related to trauma, age, sun damage and/or use of blood thinners, chronic use of topical and/or oral steroids - Observe -  Can use OTC arnica containing moisturizer such as Dermend Bruise Formula if desired - Call for worsening or other concerns  Hemangiomas - Red papules - Discussed benign nature - Observe - Call for any changes  History of Squamous  Cell Carcinoma of the Skin - No evidence of recurrence today L inferior mandible - Recommend regular full body skin exams - Recommend daily broad spectrum sunscreen SPF 30+ to sun-exposed areas, reapply every 2 hours as needed.  - Call if any new or changing lesions are noted between office visits  Inflamed seborrheic keratosis (2) R lat mid back x 2  Symptomatic, irritating, patient would like treated.  Destruction of lesion - R lat mid back x 2  Destruction method: cryotherapy   Informed consent: discussed and consent obtained   Lesion destroyed using liquid nitrogen: Yes   Region frozen until ice ball extended beyond lesion: Yes   Outcome: patient tolerated procedure well with no complications   Post-procedure details: wound care instructions given   Additional details:  Prior to procedure, discussed risks of blister formation, small wound, skin dyspigmentation, or rare scar following cryotherapy. Recommend Vaseline ointment to treated areas while healing.   Rosacea face  Chronic and persistent condition with duration or expected duration over one year. Currently with mild flare.  Rosacea is a chronic progressive skin condition usually affecting the face of adults, causing redness and/or acne bumps. It is treatable but not curable. It sometimes affects the eyes (ocular rosacea) as well. It may respond to topical and/or systemic medication and can flare with stress, sun exposure, alcohol, exercise and some foods.  Daily application of broad spectrum spf 30+ sunscreen to face is recommended to reduce flares.  Start metronidazole 0.75% gel Apply to nose and cheeks QHS dsp 45g 3Rf.  metroNIDAZOLE (METROGEL) 0.75 % gel - face Apply to nose and cheeks every night for rosacea.  AK (actinic keratosis) (9) vertex scalp x 6, frontal scalp x 2, L mandible x 1  Good result with 5FU/Vit D cream to face.  If pt still has 5FU/Calcipotriene cream at home, start BID x 7-10 days to  scalp.  5-fluorouracil/calcipotriene cream is is a type of field treatment used to treat precancers, thin skin cancers, and areas of sun damage. Expected reaction includes irritation and mild inflammation potentially progressing to more severe inflammation including redness, scaling, crusting and open sores/erosions.  If too much irritation occurs, ensure application of only a thin layer and decrease frequency of use to achieve a tolerable level of inflammation. Recommend applying Vaseline ointment to open sores as needed.  Minimize sun exposure while under treatment. Recommend daily broad spectrum sunscreen SPF 30+ to sun-exposed areas, reapply every 2 hours as needed.   Actinic keratoses are precancerous spots that appear secondary to cumulative UV radiation exposure/sun exposure over time. They are chronic with expected duration over 1 year. A portion of actinic keratoses will progress to squamous cell carcinoma of the skin. It is not possible to reliably predict which spots will progress to skin cancer and so treatment is recommended to prevent development of skin cancer.  Recommend daily broad spectrum sunscreen SPF 30+ to sun-exposed areas, reapply every 2 hours as needed.  Recommend staying in the shade or wearing long sleeves, sun glasses (UVA+UVB protection) and wide brim hats (4-inch brim around the entire circumference of the hat). Call for new or changing lesions.  Destruction of lesion - vertex scalp x 6, frontal scalp x 2, L mandible x 1  Destruction  method: cryotherapy   Informed consent: discussed and consent obtained   Lesion destroyed using liquid nitrogen: Yes   Region frozen until ice ball extended beyond lesion: Yes   Outcome: patient tolerated procedure well with no complications   Post-procedure details: wound care instructions given   Additional details:  Prior to procedure, discussed risks of blister formation, small wound, skin dyspigmentation, or rare scar following  cryotherapy. Recommend Vaseline ointment to treated areas while healing.   Related Medications fluorouracil (EFUDEX) 5 % cream Apply to cheeks and temples twice a day x 7 days as tolerated.   Return in about 6 months (around 01/30/2023) for UBSE, Hx SCC, Hx AKs.  IJamesetta Orleans, CMA, am acting as scribe for Brendolyn Patty, MD .  Documentation: I have reviewed the above documentation for accuracy and completeness, and I agree with the above.  Brendolyn Patty MD

## 2022-07-30 NOTE — Patient Instructions (Addendum)
If you still have fluorouracil 5% cream/calcipotriene cream - apply to scalp twice a day x 7-10 days once healed from cryotherapy treatment.  5-fluorouracil/calcipotriene cream is is a type of field treatment used to treat precancers, thin skin cancers, and areas of sun damage. Expected reaction includes irritation and mild inflammation potentially progressing to more severe inflammation including redness, scaling, crusting and open sores/erosions.  If too much irritation occurs, ensure application of only a thin layer and decrease frequency of use to achieve a tolerable level of inflammation. Recommend applying Vaseline ointment to open sores as needed.  Minimize sun exposure while under treatment. Recommend daily broad spectrum sunscreen SPF 30+ to sun-exposed areas, reapply every 2 hours as needed.    Start metronidazole 0.75% gel - apply to cheeks and nose every night for rosacea.   Rosacea  What is rosacea? Rosacea (say: ro-zay-sha) is a common skin disease that usually begins as a trend of flushing or blushing easily.  As rosacea progresses, a persistent redness in the center of the face will develop and may gradually spread beyond the nose and cheeks to the forehead and chin.  In some cases, the ears, chest, and back could be affected.  Rosacea may appear as tiny blood vessels or small red bumps that occur in crops.  Frequently they can contain pus, and are called "pustules".  If the bumps do not contain pus, they are referred to as "papules".  Rarely, in prolonged, untreated cases of rosacea, the oil glands of the nose and cheeks may become permanently enlarged.  This is called rhinophyma, and is seen more frequently in men.  Signs and Risks In its beginning stages, rosacea tends to come and go, which makes it difficult to recognize.  It can start as intermittent flushing of the face.  Eventually, blood vessels may become permanently visible.  Pustules and papules can appear, but can be mistaken  for adult acne.  People of all races, ages, genders and ethnic groups are at risk of developing rosacea.  However, it is more common in women (especially around menopause) and adults with fair skin between the ages of 28 and 43.  Treatment Dermatologists typically recommend a combination of treatments to effectively manage rosacea.  Treatment can improve symptoms and may stop the progression of the rosacea.  Treatment may involve both topical and oral medications.  The tetracycline antibiotics are often used for their anti-inflammatory effect; however, because of the possibility of developing antibiotic resistance, they should not be used long term at full dose.  For dilated blood vessels the options include electrodessication (uses electric current through a small needle), laser treatment, and cosmetics to hide the redness.   With all forms of treatment, improvement is a slow process, and patients may not see any results for the first 3-4 weeks.  It is very important to avoid the sun and other triggers.  Patients must wear sunscreen daily.  Skin Care Instructions: Cleanse the skin with a mild soap such as CeraVe cleanser, Cetaphil cleanser, or Dove soap once or twice daily as needed. Moisturize with Eucerin Redness Relief Daily Perfecting Lotion (has a subtle green tint), CeraVe Moisturizing Cream, or Oil of Olay Daily Moisturizer with sunscreen every morning and/or night as recommended. Makeup should be "non-comedogenic" (won't clog pores) and be labeled "for sensitive skin". Good choices for cosmetics are: Neutrogena, Almay, and Physician's Formula.  Any product with a green tint tends to offset a red complexion. If your eyes are dry and irritated, use  artificial tears 2-3 times per day and cleanse the eyelids daily with baby shampoo.  Have your eyes examined at least every 2 years.  Be sure to tell your eye doctor that you have rosacea. Alcoholic beverages tend to cause flushing of the skin, and may  make rosacea worse. Always wear sunscreen, protect your skin from extreme hot and cold temperatures, and avoid spicy foods, hot drinks, and mechanical irritation such as rubbing, scrubbing, or massaging the face.  Avoid harsh skin cleansers, cleansing masks, astringents, and exfoliation. If a particular product burns or makes your face feel tight, then it is likely to flare your rosacea. If you are having difficulty finding a sunscreen that you can tolerate, you may try switching to a chemical-free sunscreen.  These are ones whose active ingredient is zinc oxide or titanium dioxide only.  They should also be fragrance free, non-comedogenic, and labeled for sensitive skin. Rosacea triggers may vary from person to person.  There are a variety of foods that have been reported to trigger rosacea.  Some patients find that keeping a diary of what they were doing when they flared helps them avoid triggers.   Cryotherapy Aftercare  Wash gently with soap and water everyday.   Apply Vaseline and Band-Aid daily until healed.   Seborrheic Keratosis  What causes seborrheic keratoses? Seborrheic keratoses are harmless, common skin growths that first appear during adult life.  As time goes by, more growths appear.  Some people may develop a large number of them.  Seborrheic keratoses appear on both covered and uncovered body parts.  They are not caused by sunlight.  The tendency to develop seborrheic keratoses can be inherited.  They vary in color from skin-colored to gray, brown, or even black.  They can be either smooth or have a rough, warty surface.   Seborrheic keratoses are superficial and look as if they were stuck on the skin.  Under the microscope this type of keratosis looks like layers upon layers of skin.  That is why at times the top layer may seem to fall off, but the rest of the growth remains and re-grows.    Treatment Seborrheic keratoses do not need to be treated, but can easily be removed in  the office.  Seborrheic keratoses often cause symptoms when they rub on clothing or jewelry.  Lesions can be in the way of shaving.  If they become inflamed, they can cause itching, soreness, or burning.  Removal of a seborrheic keratosis can be accomplished by freezing, burning, or surgery. If any spot bleeds, scabs, or grows rapidly, please return to have it checked, as these can be an indication of a skin cancer.  Due to recent changes in healthcare laws, you may see results of your pathology and/or laboratory studies on MyChart before the doctors have had a chance to review them. We understand that in some cases there may be results that are confusing or concerning to you. Please understand that not all results are received at the same time and often the doctors may need to interpret multiple results in order to provide you with the best plan of care or course of treatment. Therefore, we ask that you please give [place] 2 business days to thoroughly review all your results before contacting the office for clarification. Should we see a critical lab result, you will be contacted sooner.   If You Need Anything After Your Visit  If you have any questions or concerns for your doctor, please call  our main line at [phone] and press option 4 to reach your doctor's medical assistant. If no one answers, please leave a voicemail as directed and we will return your call as soon as possible. Messages left after 4 pm will be answered the following business day.   You may also send [place] a message via [place]. We typically respond to MyChart messages within 1-2 business days.  For prescription refills, please ask your pharmacy to contact our office. Our fax number is [phone].  If you have an urgent issue when the clinic is closed that cannot wait until the next business day, you can page your doctor at the number below.    Please note that while we do our best to be available for urgent issues outside of  office hours, we are not available 24/7.   If you have an urgent issue and are unable to reach [place], you may choose to seek medical care at your doctor's office, retail clinic, urgent care center, or emergency room.  If you have a medical emergency, please immediately call 911 or go to the emergency department.  Pager Numbers  - Dr. Nehemiah Massed: [phone]  - Dr. Laurence Ferrari: [phone]  - Dr. Nicole Kindred: [phone]  In the event of inclement weather, please call our main line at [phone] for an update on the status of any delays or closures.  Dermatology Medication Tips: Please keep the boxes that topical medications come in in order to help keep track of the instructions about where and how to use these. Pharmacies typically print the medication instructions only on the boxes and not directly on the medication tubes.   If your medication is too expensive, please contact our office at [phone] option 4 or send [place] a message through [place].   We are unable to tell what your co-pay for medications will be in advance as this is different depending on your insurance coverage. However, we may be able to find a substitute medication at lower cost or fill out paperwork to get insurance to cover a needed medication.   If a prior authorization is required to get your medication covered by your insurance company, please allow [place] 1-2 business days to complete this process.  Drug prices often vary depending on where the prescription is filled and some pharmacies may offer cheaper prices.  The website www.goodrx.com contains coupons for medications through different pharmacies. The prices here do not account for what the cost may be with help from insurance (it may be cheaper with your insurance), but the website can give you the price if you did not use any insurance.  - You can print the associated coupon and take it with your prescription to the pharmacy.  - You may also stop by our office during  regular business hours and pick up a GoodRx coupon card.  - If you need your prescription sent electronically to a different pharmacy, notify our office through [facility] or by phone at [phone] option 4.     Si Usted Necesita Algo Despus de Su Visita  Tambin puede enviarnos un mensaje a travs de Pharmacist, community. Por lo general respondemos a los mensajes de MyChart en el transcurso de 1 a 2 das hbiles.  Para renovar recetas, por favor pida a su farmacia que se ponga en contacto con nuestra oficina. Harland Dingwall de fax es [place] [phone].  Si tiene un asunto urgente cuando la clnica est cerrada y que no puede esperar hasta el siguiente da hbil, [place]  llamar/localizar a su doctor(a) al nmero que aparece a continuacin.   Por favor, tenga en cuenta que aunque hacemos todo lo posible para estar disponibles para asuntos urgentes fuera del horario de [place], no estamos disponibles las 24 horas del da, los 7 das de la [place].   Si tiene un problema urgente y no puede comunicarse con nosotros, puede optar por buscar atencin mdica  en el consultorio de su doctor(a), en una clnica privada, en un centro de atencin urgente o en una sala de emergencias.  Si tiene Engineering geologist, por favor llame inmediatamente al 911 o vaya a la sala de emergencias.  Nmeros de bper  - Dr. Nehemiah Massed: [phone]  - Dra. Moye: [phone]  - Dra. Nicole Kindred: [phone]  En caso de inclemencias del [place], por favor llame a Johnsie Kindred principal al [phone] para una actualizacin sobre el [place] de cualquier retraso o cierre.  Consejos para la medicacin en dermatologa: Por favor, guarde las cajas en las que vienen los medicamentos de uso tpico para ayudarle a seguir las instrucciones sobre dnde y cmo usarlos. Las farmacias generalmente imprimen las instrucciones del medicamento slo en las cajas y no directamente en los tubos del [place].   Si su medicamento es muy  caro, por favor, pngase en contacto con Zigmund Daniel llamando al [phone] y presione la opcin 4 o envenos un mensaje a travs de Pharmacist, community.   No podemos decirle cul ser su copago por los medicamentos por adelantado ya que esto es diferente dependiendo de la cobertura de su seguro. Sin embargo, es posible que podamos encontrar un medicamento sustituto a Electrical engineer un formulario para que el seguro cubra el medicamento que se considera necesario.   Si se requiere una autorizacin previa para que su compaa de seguros [place] su medicamento, por favor permtanos de 1 a 2 das hbiles para completar este proceso.  Los precios de los medicamentos varan con frecuencia dependiendo del Environmental consultant de dnde se surte la receta y alguna farmacias pueden ofrecer precios ms baratos.  El sitio web www.goodrx.com tiene cupones para medicamentos de Airline pilot. Los precios aqu no tienen en cuenta lo que podra costar con la ayuda del seguro (puede ser ms barato con su seguro), pero el sitio web puede darle el precio si no utiliz Research scientist (physical sciences).  - Puede imprimir el cupn correspondiente y llevarlo con su receta a la farmacia.  - Tambin puede pasar por nuestra oficina durante el horario de atencin regular y Charity fundraiser una tarjeta de cupones de GoodRx.  - Si necesita que su receta se enve electrnicamente a una farmacia diferente, informe a nuestra oficina a travs de MyChart de [place] o por telfono llamando al [phone] y presione la opcin 4.

## 2022-07-31 ENCOUNTER — Telehealth: Payer: Self-pay | Admitting: *Deleted

## 2022-07-31 DIAGNOSIS — I712 Thoracic aortic aneurysm, without rupture, unspecified: Secondary | ICD-10-CM

## 2022-07-31 NOTE — Telephone Encounter (Signed)
Attempted to call pt. No answer. Lmtcb.  

## 2022-07-31 NOTE — Telephone Encounter (Signed)
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  We will plan to repeat a CTA in about a year to ensure that it does not begin to grow.  He should continue his current medications and follow-up as planned later this month.

## 2022-07-31 NOTE — Telephone Encounter (Signed)
Pt notified of CTA of chest/aorta results and Dr. Darnelle Bos recc.  Pt voiced understanding. Pt will follow up as scheduled next week 08/08/22 with Dr. Saunders Revel.   Future orders placed for CTA of chest/aorta in 1 year for h/o TAA.   Pt has no further questions at this time.

## 2022-07-31 NOTE — Telephone Encounter (Signed)
Pt is returning call. Transferred to RN, Jinny Blossom.

## 2022-08-07 NOTE — Progress Notes (Unsigned)
Follow-up Outpatient Visit Date: 08/08/2022  Primary Care Provider: Olin Hauser, DO [address] [address]  Chief Complaint: Follow-up CAD and atrial fibrillation  HPI:  Anthony Mosley is a 87 y.o. male with history of coronary artery disease, chronic HFrEF with partially recovered LVEF, MAT versus PAF, pulmonary hypertension, thoracic aortic aneurysm, mild to moderate mitral regurgitation, hyperlipidemia, and chronic kidney disease stage III, who presents for follow-up of failure and atrial arrhythmias.  I last saw him in February, at which time Anthony Mosley was feeling well other than intermittent heartburn.  Today, Anthony Mosley reports that he has been feeling well.  He denies chest pain, shortness of breath, palpitations, lightheadedness, and edema.  He is using his as needed furosemide most days.  He has not had any bleeding.  He goes to the gym 3 times a week and has also been active in his garden this summer without any limitations.  --------------------------------------------------------------------------------------------------  Past Medical History:  Diagnosis Date   Aortic atherosclerosis (HCC)    Aortic root dilatation (HCC)    a.) TTE 05/29/2017 --> measured 28 mm. b.)TTE 10/25/2020 --> measured 40 mm.; 45 mm by CTA chest, most recently 8/22   Arthritis    B12 deficiency    Bilateral lower extremity edema    BPH (benign prostatic hyperplasia)    [place] of lung ([place])    a. seen on CT.   CAD (coronary artery disease)    a.  Elective R/LHC 06/12/18 showed mild-moderate nonobstructive RCA CAD with mild LAD myocardial bridging but no significant obstructive CAD; there was mildly elevated L heart filling pressure, moderately elevated R heart filling pressure, and mild pulm HTN.   Chronic anticoagulation    Apixaban   Chronic knee pain    CKD (chronic kidney disease), stage III (HCC)    Cough    DJD (degenerative joint disease)    Erectile dysfunction     Former tobacco use    GERD (gastroesophageal reflux disease)    occ-tums prn   Hemorrhoids    HFrEF (heart failure with reduced ejection fraction) (HCC)    Hyperbilirubinemia    Hyperlipidemia    Mild aortic insufficiency    Mild pulmonary hypertension (HCC)    Moderate mitral regurgitation    NICM (nonischemic cardiomyopathy) ([place])    a. EF 40-45% by echo 05/2017, improved to 45-50% by echo 10/2020.   PAC (premature atrial contraction)    Paroxysmal A-fib (HCC)    Pleural effusion, left 11/2018   Pneumonia    Prolonged QT syndrome    RBBB    Rosacea    Ruptured spleen    Skin cancer    Left wrist - tx by Dr Koleen Nimrod in the past.   Squamous cell carcinoma of skin 11/27/2018   L inf mandible    Thoracic aortic aneurysm ([place])    a.) TTE 05/29/2017 --> 4.2 cm . b.) CT 12/2017 --> measured 4.4 cm, imaged incompletely at 4.3cm in 10/2018 CT. c.) CTA 07/27/2021 --> measured 4.5 cm.   Varicocele    Wears dentures    partial upper   Wheezing    Past Surgical History:  Procedure Laterality Date   ABDOMINAL SURGERY     due to ruptured spleen   APPENDECTOMY     Dr Emilio Math   CARDIAC CATHETERIZATION Left 07/20/2004   Procedure: CARDIAC CATHETERIZATION; Location: [facility]; Surgeon: Eustace Quail, MD   CARDIAC CATHETERIZATION Left 01/29/2001   Procedure: CARDIAC CATHETERIZATION; Location: [facility]; Surgeon: Earlyne Iba  Humphrey Rolls, MD   CATARACT EXTRACTION W/PHACO Right 10/24/2015   Procedure: CATARACT EXTRACTION PHACO AND INTRAOCULAR LENS PLACEMENT ([place]);  Surgeon: [name], MD;  Location: [facility];  Service: Ophthalmology;  Laterality: Right;  [place] 00:52 AP% 23.5 CDE 12.35 fluid pack lot # [id number]   CATARACT EXTRACTION W/PHACO Left 11/14/2015   Procedure: CATARACT EXTRACTION PHACO AND INTRAOCULAR LENS PLACEMENT (IOC);  Surgeon: [name], MD;  Location: [facility];  Service: Ophthalmology;  Laterality: Left;  [place] 00:55 AP% 19.1 CDE 10.66 fluid pack lot # [id number]   COLONOSCOPY  1987    COLONOSCOPY WITH PROPOFOL N/A 08/22/2017   Procedure: COLONOSCOPY WITH PROPOFOL;  Surgeon: [name], MD;  Location: [place];  Service: Gastroenterology;  Laterality: N/A;   HERNIA REPAIR     umbilical/ Dr Archie Patten HERNIA REPAIR Right 02/04/2019   Procedure: HERNIA REPAIR INGUINAL WITH MESH;  Surgeon: Olean Ree, MD;  Location: [facility];  Service: General;  Laterality: Right;   IR ANGIOGRAM VISCERAL SELECTIVE  10/29/2018   IR EMBO ART  VEN HEMORR LYMPH EXTRAV  INC GUIDE ROADMAPPING  10/29/2018   IR THORACENTESIS ASP PLEURAL SPACE W/IMG GUIDE  11/23/2018   IR US GUIDE VASC ACCESS RIGHT  10/29/2018   KNEE ARTHROPLASTY Right 09/19/2021   Procedure: COMPUTER ASSISTED TOTAL KNEE ARTHROPLASTY;  Surgeon: Dereck Leep, MD;  Location: [facility];  Service: Orthopedics;  Laterality: Right;   KNEE ARTHROSCOPY Bilateral    POLYPECTOMY  08/22/2017   Procedure: POLYPECTOMY;  Surgeon: [name], MD;  Location: [place];  Service: Gastroenterology;;   RIGHT/LEFT HEART CATH AND CORONARY ANGIOGRAPHY N/A 06/13/2017   Procedure: Right/Left Heart Cath and Coronary Angiography;  Surgeon: [name], MD;  Location: [facility];  Service: Cardiovascular;  Laterality: N/A;   TONSILLECTOMY      Current Meds  Medication Sig   ACCU-CHEK GUIDE test strip Check blood sugar 1 x daily   apixaban (ELIQUIS) 2.5 MG TABS tablet Take 1 tablet (2.5 mg total) by mouth 2 (two) times daily.   atorvastatin (LIPITOR) 80 MG tablet TAKE 1 TABLET EVERY DAY   B COMPLEX VITAMINS PO Take 1 tablet by mouth daily.    calcium carbonate (TUMS - DOSED IN MG ELEMENTAL CALCIUM) 500 MG chewable tablet Chew 1 tablet by mouth as needed for indigestion or heartburn.   Carboxymethylcellul-Glycerin (LUBRICATING EYE DROPS OP) Apply 1 drop to eye as needed (dry eyes).    cholecalciferol (VITAMIN D3) 25 MCG (1000 UNIT) tablet Take 1,000 Units by mouth daily.   COLLAGEN PO Take 1 Scoop by mouth  daily.   diclofenac sodium (VOLTAREN) 1 % GEL Apply 4 g topically 4 (four) times daily. To the knee if needed   fluorouracil (EFUDEX) 5 % cream Apply to cheeks and temples twice a day x 7 days as tolerated.   fluticasone (FLONASE) 50 MCG/ACT nasal spray Place 1 spray into both nostrils daily as needed (congestion).   furosemide (LASIX) 20 MG tablet TAKE 1 TABLET EVERY DAY AS NEEDED   Guaifenesin 1200 MG TB12 Take 1,200 mg by mouth at bedtime.   metroNIDAZOLE (METROGEL) 0.75 % gel Apply to nose and cheeks every night for rosacea.   Potassium 99 MG TABS Take 99 mg by mouth daily. Leg cramps   Tetrahydroz-Dextran-PEG-Povid (EYE DROPS ADVANCED RELIEF OP) Apply 1 drop to eye as needed.   triamcinolone cream (KENALOG) 0.1 % Apply 1 Application topically 2 (two) times daily as needed.    Allergies: Macrolides  and ketolides, Norvasc [amlodipine], Avodart [dutasteride], and Tape  Social History   Tobacco Use   Smoking status: Former    Packs/day: 1.50    Years: 20.00    Total pack years: 30.00    Types: Cigarettes    Quit date: 58    Years since quitting: 36.6   Smokeless tobacco: Former    Types: Chew    Quit date: 2000   Tobacco comments:    smoking cessation materials not required  Scientific laboratory technician Use: Never used  Substance Use Topics   Alcohol use: No    Alcohol/week: 0.0 standard drinks of alcohol   Drug use: No    Family History  Problem Relation Age of Onset   Alzheimer's disease Mother    Pulmonary embolism Father    Diabetes Father    Heart attack Father 69   Diabetes Brother    Heart disease Brother        CABG   Heart disease Brother        CABG   Diabetes Brother    Diabetes Brother    Healthy Brother     Review of Systems: A 12-system review of systems was performed and was negative except as noted in the HPI.  --------------------------------------------------------------------------------------------------  Physical Exam: BP (!) 120/54 (BP  Location: Left Arm, Patient Position: Sitting, Cuff Size: Normal)   Pulse (!) 56   Ht '6\' 1"'$  (1.854 m)   Wt 191 lb (86.6 kg)   SpO2 96%   BMI 25.20 kg/m   General:  NAD. Neck: No JVD or HJR. Lungs: Clear to auscultation bilaterally without wheezes or crackles. Heart: Regular rate and rhythm with occasional extrasystoles. Abdomen: Soft, nontender, nondistended. Extremities: No lower extremity edema.  EKG: Sinus bradycardia with PACs and bifascicular block (RBBB and LPFB).  Compared with prior tracing from 02/01/2022, QRS complex has shifted.  Otherwise, no significant interval change.  Lab Results  Component Value Date   WBC 6.2 05/24/2022   HGB 13.6 05/24/2022   HCT 40.9 05/24/2022   MCV 92.3 05/24/2022   PLT 191 05/24/2022    Lab Results  Component Value Date   NA 139 05/24/2022   K 4.1 05/24/2022   CL 102 05/24/2022   CO2 29 05/24/2022   BUN 22 05/24/2022   CREATININE 1.50 (H) 07/29/2022   GLUCOSE 97 05/24/2022   ALT 14 05/24/2022    Lab Results  Component Value Date   CHOL 111 05/24/2022   HDL 54 05/24/2022   LDLCALC 44 05/24/2022   TRIG 54 05/24/2022   CHOLHDL 2.1 05/24/2022    --------------------------------------------------------------------------------------------------  ASSESSMENT AND PLAN: Paroxysmal atrial fibrillation versus multifocal atrial tachycardia: No palpitations reported.  EKG today shows sinus rhythm with PACs.  Given that Anthony Mosley creatinine has been at or below 1.5 for at least the last year, I have recommended increasing apixaban to 5 mg twice daily.  No other medication changes recommended at this time.  Defer standing beta-blocker in the setting of baseline bradycardia.  Chronic HFrEF with recovered ejection fraction: Anthony Mosley appears euvolemic with NYHA class I symptoms.  I have encouraged him to stay well-hydrated.  He can use furosemide on an as-needed basis for edema or weight gain in order to prevent intravascular volume  depletion and worsening of his baseline chronic kidney disease.  Defer rechallenging with ACE inhibitor/ARB and beta-blocker in the setting of renal insufficiency as well as borderline low heart rate and blood pressure.  Chronic kidney disease  stage IIIa: Creatinine relatively stable.  Avoid nephrotoxic agents.  Maintain adequate hydration.  Apixaban to be dosed as outlined above.  Coronary artery disease: No angina reported.  Continue atorvastatin 80 mg daily.  Lipids well controlled on last check in June.  Continue apixaban and lieu of aspirin with history of PAF.  Thoracic aortic aneurysm: Mildly-moderately dilated ascending aorta stable on recent chest CTA.  We will continue with medical therapy to prevent progression and annual CT follow-up.  Follow-up: Return to clinic in 6 months.  [name], MD 08/08/2022 8:17 AM

## 2022-08-08 ENCOUNTER — Encounter: Payer: Self-pay | Admitting: Internal Medicine

## 2022-08-08 ENCOUNTER — Ambulatory Visit: Payer: Medicare HMO | Admitting: Internal Medicine

## 2022-08-08 VITALS — BP 120/54 | HR 56 | Ht 73.0 in | Wt 191.0 lb

## 2022-08-08 DIAGNOSIS — I2510 Atherosclerotic heart disease of native coronary artery without angina pectoris: Secondary | ICD-10-CM | POA: Diagnosis not present

## 2022-08-08 DIAGNOSIS — N1831 Chronic kidney disease, stage 3a: Secondary | ICD-10-CM | POA: Diagnosis not present

## 2022-08-08 DIAGNOSIS — I712 Thoracic aortic aneurysm, without rupture, unspecified: Secondary | ICD-10-CM | POA: Diagnosis not present

## 2022-08-08 DIAGNOSIS — I480 Paroxysmal atrial fibrillation: Secondary | ICD-10-CM

## 2022-08-08 DIAGNOSIS — I5022 Chronic systolic (congestive) heart failure: Secondary | ICD-10-CM

## 2022-08-08 MED ORDER — APIXABAN 5 MG PO TABS: 5.0000 mg | ORAL_TABLET | Freq: Two times a day (BID) | ORAL | 0 refills | Status: DC

## 2022-08-08 NOTE — Patient Instructions (Signed)
Medication Instructions:   Your physician has recommended you make the following change in your medication:   INCREASE Eliquis 5 mg TWICE daily    - You may take TWO tablets of your current dose you still have at home of 2.5 mg   - Then when you get your new Rx, you will take one tablet (5 mg) twice daily  Patient assistance application given today in office: Please complete highlighted patient portion, and bring application back to our office along with proof of income and prescription out-of-pocket expense report; and we will send application for you.   *If you need a refill on your cardiac medications before your next appointment, please call your pharmacy*   Lab Work:  None ordered  Testing/Procedures:  None ordered   Follow-Up: At [facility], you and your health needs are our priority.  As part of our continuing mission to provide you with exceptional heart care, we have created designated Provider Care Teams.  These Care Teams include your primary Cardiologist (physician) and Advanced Practice Providers (APPs -  Physician Assistants and Nurse Practitioners) who all work together to provide you with the care you need, when you need it.  We recommend signing up for the patient portal called "MyChart".  Sign up information is provided on this After Visit Summary.  MyChart is used to connect with patients for Virtual Visits (Telemedicine).  Patients are able to view lab/test results, encounter notes, upcoming appointments, etc.  Non-urgent messages can be sent to your provider as well.   To learn more about what you can do with MyChart, go to [email or web address].    Your next appointment:   6 month(s)  The format for your next appointment:   In Person  Provider:   You may see [name], MD or one of the following Advanced Practice Providers on your designated Care Team:   Murray Hodgkins, NP Christell Faith, PA-C Cadence Kathlen Mody, [place]  Important Information About  [place]

## 2022-08-27 ENCOUNTER — Ambulatory Visit: Payer: Medicare HMO

## 2022-08-28 ENCOUNTER — Telehealth: Payer: Self-pay | Admitting: Internal Medicine

## 2022-08-28 MED ORDER — APIXABAN 5 MG PO TABS: 5.0000 mg | ORAL_TABLET | Freq: Two times a day (BID) | ORAL | 1 refills | Status: DC

## 2022-08-28 NOTE — Telephone Encounter (Signed)
*  STAT* If patient is at the pharmacy, call can be transferred to refill team.   1. Which medications need to be refilled? (please list name of each medication and dose if known)   apixaban (ELIQUIS) 5 MG TABS tablet  2. Which pharmacy/location (including street and city if local pharmacy) is medication to be sent to?  [place], [place]  3. Do they need a 30 day or 90 day supply? 90 day  Patient stated he did not qualify for the [place] reduced rate medication and will need to get this medication refilled.  Patient stated that he has enough medication to last until his refill come in.

## 2022-08-28 NOTE — Telephone Encounter (Signed)
Prescription refill request for Eliquis received. Indication:Afib Last office visit:8/23 Scr:1.4 Age: 87 Weight:86.6 kg  Prescription refilled

## 2022-08-28 NOTE — Telephone Encounter (Signed)
Refill request

## 2022-08-30 ENCOUNTER — Ambulatory Visit (INDEPENDENT_AMBULATORY_CARE_PROVIDER_SITE_OTHER): Payer: Medicare HMO

## 2022-08-30 VITALS — BP 110/58 | Ht 73.0 in | Wt 190.2 lb

## 2022-08-30 DIAGNOSIS — Z0000 Encounter for general adult medical examination without abnormal findings: Secondary | ICD-10-CM | POA: Diagnosis not present

## 2022-08-30 NOTE — Patient Instructions (Signed)
Anthony Mosley , Thank you for taking time to come for your Medicare Wellness Visit. I appreciate your ongoing commitment to your health goals. Please review the following plan we discussed and let me know if I can assist you in the future.   Screening recommendations/referrals: Colonoscopy: aged out Recommended yearly ophthalmology/optometry visit for glaucoma screening and checkup Recommended yearly dental visit for hygiene and checkup  Vaccinations: Influenza vaccine: 11/21/21 Pneumococcal vaccine: 11/04/18 Tdap vaccine: n/d Shingles vaccine: Zostavax 06/13/09   Shingrix 07/17/22   Covid-19: 01/11/20, 02/01/20  Advanced directives: yes  Conditions/risks identified: none  Next appointment: Follow up in one year for your annual wellness visit. 09/05/23 @ 8:15 am in person  Preventive Care 65 Years and Older, Male Preventive care refers to lifestyle choices and visits with your health care provider that can promote health and wellness. What does preventive care include? A yearly physical exam. This is also called an annual well check. Dental exams once or twice a year. Routine eye exams. Ask your health care provider how often you should have your eyes checked. Personal lifestyle choices, including: Daily care of your teeth and gums. Regular physical activity. Eating a healthy diet. Avoiding tobacco and drug use. Limiting alcohol use. Practicing safe sex. Taking low doses of aspirin every day. Taking vitamin and mineral supplements as recommended by your health care provider. What happens during an annual well check? The services and screenings done by your health care provider during your annual well check will depend on your age, overall health, lifestyle risk factors, and family history of disease. Counseling  Your health care provider may ask you questions about your: Alcohol use. Tobacco use. Drug use. Emotional well-being. Home and relationship well-being. Sexual  activity. Eating habits. History of falls. Memory and ability to understand (cognition). Work and work Statistician. Screening  You may have the following tests or measurements: Height, weight, and BMI. Blood pressure. Lipid and cholesterol levels. These may be checked every 5 years, or more frequently if you are over 42 years old. Skin check. Lung cancer screening. You may have this screening every year starting at age 80 if you have a 30-pack-year history of smoking and currently smoke or have quit within the past 15 years. Fecal occult blood test (FOBT) of the stool. You may have this test every year starting at age 38. Flexible sigmoidoscopy or colonoscopy. You may have a sigmoidoscopy every 5 years or a colonoscopy every 10 years starting at age 72. Prostate cancer screening. Recommendations will vary depending on your family history and other risks. Hepatitis C blood test. Hepatitis B blood test. Sexually transmitted disease (STD) testing. Diabetes screening. This is done by checking your blood sugar (glucose) after you have not eaten for a while (fasting). You may have this done every 1-3 years. Abdominal aortic aneurysm (AAA) screening. You may need this if you are a current or former smoker. Osteoporosis. You may be screened starting at age 65 if you are at high risk. Talk with your health care provider about your test results, treatment options, and if necessary, the need for more tests. Vaccines  Your health care provider may recommend certain vaccines, such as: Influenza vaccine. This is recommended every year. Tetanus, diphtheria, and acellular pertussis (Tdap, Td) vaccine. You may need a Td booster every 10 years. Zoster vaccine. You may need this after age 61. Pneumococcal 13-valent conjugate (PCV13) vaccine. One dose is recommended after age 26. Pneumococcal polysaccharide (PPSV23) vaccine. One dose is recommended after age 9.  Talk to your health care provider about which  screenings and vaccines you need and how often you need them. This information is not intended to replace advice given to you by your health care provider. Make sure you discuss any questions you have with your health care provider. Document Released: 12/29/2015 Document Revised: 08/21/2016 Document Reviewed: 10/03/2015 Elsevier Interactive Patient Education  2017 [place] Prevention in the Home Falls can cause injuries. They can happen to people of all ages. There are many things you can do to make your home safe and to help prevent falls. What can I do on the outside of my home? Regularly fix the edges of walkways and driveways and fix any cracks. Remove anything that might make you trip as you walk through a door, such as a raised step or threshold. Trim any bushes or trees on the path to your home. Use bright outdoor lighting. Clear any walking paths of anything that might make someone trip, such as rocks or tools. Regularly check to see if handrails are loose or broken. Make sure that both sides of any steps have handrails. Any raised decks and porches should have guardrails on the edges. Have any leaves, snow, or ice cleared regularly. Use sand or salt on walking paths during winter. Clean up any spills in your garage right away. This includes oil or grease spills. What can I do in the bathroom? Use night lights. Install grab bars by the toilet and in the tub and shower. Do not use towel bars as grab bars. Use non-skid mats or decals in the tub or shower. If you need to sit down in the shower, use a plastic, non-slip stool. Keep the floor dry. Clean up any water that spills on the floor as soon as it happens. Remove soap buildup in the tub or shower regularly. Attach bath mats securely with double-sided non-slip rug tape. Do not have throw rugs and other things on the floor that can make you trip. What can I do in the bedroom? Use night lights. Make sure that you have a  light by your bed that is easy to reach. Do not use any sheets or blankets that are too big for your bed. They should not hang down onto the floor. Have a firm chair that has side arms. You can use this for support while you get dressed. Do not have throw rugs and other things on the floor that can make you trip. What can I do in the kitchen? Clean up any spills right away. Avoid walking on wet floors. Keep items that you use a lot in easy-to-reach places. If you need to reach something above you, use a strong step stool that has a grab bar. Keep electrical cords out of the way. Do not use floor polish or wax that makes floors slippery. If you must use wax, use non-skid floor wax. Do not have throw rugs and other things on the floor that can make you trip. What can I do with my stairs? Do not leave any items on the stairs. Make sure that there are handrails on both sides of the stairs and use them. Fix handrails that are broken or loose. Make sure that handrails are as long as the stairways. Check any carpeting to make sure that it is firmly attached to the stairs. Fix any carpet that is loose or worn. Avoid having throw rugs at the top or bottom of the stairs. If you do have throw  rugs, attach them to the floor with carpet tape. Make sure that you have a light switch at the top of the stairs and the bottom of the stairs. If you do not have them, ask someone to add them for you. What else can I do to help prevent falls? Wear shoes that: Do not have high heels. Have rubber bottoms. Are comfortable and fit you well. Are closed at the toe. Do not wear sandals. If you use a stepladder: Make sure that it is fully opened. Do not climb a closed stepladder. Make sure that both sides of the stepladder are locked into place. Ask someone to hold it for you, if possible. Clearly mark and make sure that you can see: Any grab bars or handrails. First and last steps. Where the edge of each step  is. Use tools that help you move around (mobility aids) if they are needed. These include: Canes. Walkers. Scooters. Crutches. Turn on the lights when you go into a dark area. Replace any light bulbs as soon as they burn out. Set up your furniture so you have a clear path. Avoid moving your furniture around. If any of your floors are uneven, fix them. If there are any pets around you, be aware of where they are. Review your medicines with your doctor. Some medicines can make you feel dizzy. This can increase your chance of falling. Ask your doctor what other things that you can do to help prevent falls. This information is not intended to replace advice given to you by your health care provider. Make sure you discuss any questions you have with your health care provider. Document Released: 09/28/2009 Document Revised: 05/09/2016 Document Reviewed: 01/06/2015 Elsevier Interactive Patient Education  2017 [employer].

## 2022-08-30 NOTE — Progress Notes (Signed)
Subjective:   Anthony Mosley is a 87 y.o. male who presents for Medicare Annual/Subsequent preventive examination.  Review of Systems     Cardiac Risk Factors include: advanced age (>18mn, >>98women);dyslipidemia;male gender     Objective:    Today's Vitals   08/30/22 0814  BP: (!) 110/58  Weight: 190 lb 3.2 oz (86.3 kg)  Height: '6\' 1"'$  (1.854 m)   Body mass index is 25.09 kg/m.     08/30/2022    8:22 AM 09/19/2021    7:08 AM 09/10/2021   10:40 AM 08/21/2021    9:04 AM 08/08/2020    3:43 PM 07/30/2019    1:25 PM 02/04/2019    7:55 AM  Advanced Directives  Does Patient Have a Medical Advance Directive? Yes Yes Yes Yes Yes Yes Yes  Type of AParamed[place]of [place] will Living will;Healthcare Power of [place] will [place] Cour[place] will [place]ipp[place] will Living will  Does patient want to make changes to medical advance directive? No - Patient declined No - Patient declined    No - Patient declined No - Patient declined  Copy of HRidgewayin Chart? Yes - validated most recent copy scanned in chart (See row information) No - copy requested  Yes - validated most recent copy scanned in chart (See row information) Yes - validated most recent copy scanned in chart (See row information) Yes - validated most recent copy scanned in chart (See row information)     Current Medications (verified) Outpatient Encounter Medications as of 08/30/2022  Medication Sig   ACCU-CHEK GUIDE test strip Check blood sugar 1 x daily   apixaban (ELIQUIS) 5 MG TABS tablet Take 1 tablet (5 mg total) by mouth 2 (two) times daily.   atorvastatin (LIPITOR) 80 MG tablet TAKE 1 TABLET EVERY DAY   B COMPLEX VITAMINS PO Take 1 tablet by mouth daily.    calcium carbonate (TUMS - DOSED IN MG ELEMENTAL CALCIUM) 500 MG chewable tablet Chew 1 tablet by mouth as needed  for indigestion or heartburn.   Carboxymethylcellul-Glycerin (LUBRICATING EYE DROPS OP) Apply 1 drop to eye as needed (dry eyes).    celecoxib (CELEBREX) 200 MG capsule Take 1 capsule (200 mg total) by mouth 2 (two) times daily.   cholecalciferol (VITAMIN D3) 25 MCG (1000 UNIT) tablet Take 1,000 Units by mouth daily.   COLLAGEN PO Take 1 Scoop by mouth daily.   diclofenac sodium (VOLTAREN) 1 % GEL Apply 4 g topically 4 (four) times daily. To the knee if needed   fluorouracil (EFUDEX) 5 % cream Apply to cheeks and temples twice a day x 7 days as tolerated.   fluticasone (FLONASE) 50 MCG/ACT nasal spray Place 1 spray into both nostrils daily as needed (congestion).   furosemide (LASIX) 20 MG tablet TAKE 1 TABLET EVERY DAY AS NEEDED   Guaifenesin 1200 MG TB12 Take 1,200 mg by mouth at bedtime.   metroNIDAZOLE (METROGEL) 0.75 % gel Apply to nose and cheeks every night for rosacea.   Potassium 99 MG TABS Take 99 mg by mouth daily. Leg cramps   SHINGRIX injection    Tetrahydroz-Dextran-PEG-Povid (EYE DROPS ADVANCED RELIEF OP) Apply 1 drop to eye as needed.   triamcinolone cream (KENALOG) 0.1 % Apply 1 Application topically 2 (two) times daily as needed.   No facility-administered encounter medications on file as of 08/30/2022.    Allergies (verified) Macrolides  and ketolides, Norvasc [amlodipine], Avodart [dutasteride], and Tape   History: Past Medical History:  Diagnosis Date   Aortic atherosclerosis ([place])    Aortic root dilatation ([place])    a.) TTE 05/29/2017 --> measured 28 mm. 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 seen on CT.   CAD (coronary artery disease)    a.  Elective R/LHC 06/12/18 showed mild-moderate nonobstructive RCA CAD with mild LAD myocardial bridging but no significant obstructive CAD; there was mildly  elevated L heart filling pressure, moderately elevated R heart filling pressure, and mild pulm HTN.   Chronic anticoagulation    Apixaban   Chronic knee pain    CKD (chronic kidney disease), stage III (HCC)    Cough    DJD (degenerative joint disease)    Erectile dysfunction    Former tobacco use    GERD (gastroesophageal reflux disease)    occ-tums prn   Hemorrhoids    HFrEF (heart failure with reduced ejection fraction) (HCC)    Hyperbilirubinemia    Hyperlipidemia    Mild aortic insufficiency    Mild pulmonary hypertension (HCC)    Moderate mitral regurgitation    NICM (nonischemic cardiomyopathy) ([place])    a. EF 40-45% by echo 05/2017, improved to 45-50% by echo 10/2020.   PAC (premature atrial contraction)    Paroxysmal A-fib (HCC)    Pleural effusion, left 11/2018   Pneumonia    Prolonged QT syndrome    RBBB    Rosacea    Ruptured spleen    Skin cancer    Left wrist - tx by Dr Koleen Nimrod in the past.   Squamous cell carcinoma of skin 11/27/2018   L inf mandible    Thoracic aortic aneurysm ([place])    a.) TTE 05/29/2017 --> 4.2 cm . b.) CT 12/2017 --> measured 4.4 cm, imaged incompletely at 4.3cm in 10/2018 CT. c.) CTA 07/27/2021 --> measured 4.5 cm.   Varicocele    Wears dentures    partial upper   Wheezing    Past Surgical History:  Procedure Laterality Date   ABDOMINAL SURGERY     due to ruptured spleen   APPENDECTOMY     Dr Emilio Math   CARDIAC CATHETERIZATION Left 07/20/2004   Procedure: CARDIAC CATHETERIZATION; Location[facility]; Surgeon: Eustace Quail, MD   CARDIAC CATHETERIZATION Left 01/29/2001   Procedure: CARDIAC CATHETERIZATION; Location: [place]; Surgeon: Neoma Laming, MD   CATARACT EXTRACTION W/PHACO Right 10/24/2015   Procedure: CATARACT EXTRACTION PHACO AND INTRAOCULAR LENS PLACEMENT ([place]);  Surgeon: [name], MD;  Location: [facility];  Service: Ophthalmology;  Laterality: Right;  [place] 00:52 AP% 23.5 CDE 12.35 fluid pack lot # [id number]   CATARACT  EXTRACTION W/PHACO Left 11/14/2015   Procedure: CATARACT EXTRACTION PHACO AND INTRAOCULAR LENS PLACEMENT (IOC);  Surgeon: [name], MD;  Location: [facility];  Service: Ophthalmology;  Laterality: Left;  [place] 00:55 AP% 19.1 CDE 10.66 fluid pack lot # [id number]   COLONOSCOPY  1987   COLONOSCOPY WITH PROPOFOL N/A 08/22/2017   Procedure: COLONOSCOPY WITH PROPOFOL;  Surgeon: [name], MD;  Location: [place];  Service: Gastroenterology;  Laterality: N/A;   HERNIA REPAIR     umbilical/ Dr Archie Patten HERNIA REPAIR Right 02/04/2019   Procedure: HERNIA REPAIR INGUINAL WITH MESH;  Surgeon: Olean Ree, MD;  Location: [facility];  Service: General;  Laterality: Right;   IR ANGIOGRAM VISCERAL SELECTIVE  10/29/2018   IR EMBO ART  VEN HEMORR LYMPH EXTRAV  INC GUIDE ROADMAPPING  10/29/2018   IR THORACENTESIS ASP PLEURAL SPACE W/IMG GUIDE  11/23/2018   IR US GUIDE VASC ACCESS RIGHT  10/29/2018   KNEE ARTHROPLASTY Right 09/19/2021   Procedure: COMPUTER ASSISTED TOTAL KNEE ARTHROPLASTY;  Surgeon: Dereck Leep, MD;  Location: [facility];  Service: Orthopedics;  Laterality: Right;   KNEE ARTHROSCOPY Bilateral    POLYPECTOMY  08/22/2017   Procedure: POLYPECTOMY;  Surgeon: [name], MD;  Location: [place];  Service: Gastroenterology;;   RIGHT/LEFT HEART CATH AND CORONARY ANGIOGRAPHY N/A 06/13/2017   Procedure: Right/Left Heart Cath and Coronary Angiography;  Surgeon: [name], MD;  Location: [place] [facility];  Service: Cardiovascular;  Laterality: N/A;   TONSILLECTOMY     Family History  Problem Relation Age of Onset   Alzheimer's disease Mother    Pulmonary embolism Father    Diabetes Father    Heart attack Father 19   Diabetes Brother    Heart disease Brother        CABG   Heart disease Brother        CABG   Diabetes Brother    Diabetes Brother    Healthy Brother    Social History   Socioeconomic History   Marital status: Married    Spouse  name: Jeanett Schlein   Number of children: 2   Years of education: Not on file   Highest education level: 7th grade  Occupational History   Occupation: Retired  Tobacco Use   Smoking status: Former    Packs/day: 1.50    Years: 20.00    Total pack years: 30.00    Types: Cigarettes    Quit date: 1987    Years since quitting: 36.7   Smokeless tobacco: Former    Types: Chew    Quit date: 2000   Tobacco comments:    smoking cessation materials not required  Scientific laboratory technician Use: Never used  Substance and Sexual Activity   Alcohol use: No    Alcohol/week: 0.0 standard drinks of alcohol   Drug use: No   Sexual activity: Never  Other Topics Concern   Not on file  Social History Narrative   Not on file   Social Determinants of Health   Financial Resource Strain: Low Risk  (08/30/2022)   Overall Financial Resource Strain (CARDIA)    Difficulty of Paying Living Expenses: Not hard at all  Food Insecurity: No Food Insecurity (08/30/2022)   Hunger Vital Sign    Worried About Running Out of Food in the Last Year: Never true    [place] in the Last Year: Never true  Transportation Needs: No Transportation Needs (08/30/2022)   PRAPARE - Hydrologist (Medical): No    Lack of Transportation (Non-Medical): No  Physical Activity: Sufficiently Active (08/30/2022)   Exercise Vital Sign    Days of Exercise per Week: 7 days    Minutes of Exercise per Session: 30 min  Recent Concern: Physical Activity - Insufficiently Active (08/30/2022)   Exercise Vital Sign    Days of Exercise per Week: 7 days    Minutes of Exercise per Session: 20 min  Stress: No Stress Concern Present (08/30/2022)   [place]  Feeling of Stress : Not at all  Social Connections: Moderately Isolated (08/30/2022)   Social Connection and Isolation Panel [NHANES]    Frequency of Communication with Friends and Family: More than  three times a week    Frequency of Social Gatherings with Friends and Family: More than three times a week    Attends Religious Services: More than 4 times per year    Active Member of [employer] or Organizations: No    Attends Archivist Meetings: Never    Marital Status: Widowed    Tobacco Counseling Counseling given: Not Answered Tobacco comments: smoking cessation materials not required   Clinical Intake:  Pre-visit preparation completed: Yes  Pain : No/denies pain     Nutritional Risks: None Diabetes: No  How often do you need to have someone help you when you read instructions, pamphlets, or other written materials from your doctor or pharmacy?: 1 - Never  Diabetic?no  Interpreter Needed?: No  Information entered by :: Kirke Shaggy, LPN   Activities of Daily Living    08/30/2022    8:23 AM 09/19/2021    3:25 PM  In your present state of health, do you have any difficulty performing the following activities:  Hearing? 0   Vision? 0   Difficulty concentrating or making decisions? 0   Walking or climbing stairs? 0   Dressing or bathing? 0   Doing errands, shopping? 0 0  Preparing Food and eating ? N   Using the Toilet? N   In the past six months, have you accidently leaked urine? N   Do you have problems with loss of bowel control? N   Managing your Medications? N   Managing your Finances? N   Housekeeping or managing your Housekeeping? N     Patient Care Team: Olin Hauser, DO as PCP - General (Family Medicine) End, Harrell Gave, MD as PCP - Cardiology (Cardiology) Watt Climes, PA as Physician Assistant (Physician Assistant) [name], MD as Referring Physician (Ophthalmology) Brendolyn Patty, MD as Consulting Physician (Dermatology) Idolina Primer, Areta Haber, PA-C as Physician Assistant (Physician Assistant) Johnathan Hausen, MD as Consulting Physician (General Surgery)  Indicate any recent Medical Services you may have received from other  than [facility] providers in the past year (date may be approximate).     Assessment:   This is a routine wellness examination for [place].  Hearing/Vision screen Hearing Screening - Comments:: No aids Vision Screening - Comments:: Wears glasses- [facility]  Dietary issues and exercise activities discussed: Current Exercise Habits: Home exercise routine, Type of exercise: walking;stretching, Time (Minutes): 30, Frequency (Times/Week): 7, Weekly Exercise (Minutes/Week): 210, Intensity: Mild   Goals Addressed             This Visit's Progress    DIET - EAT MORE FRUITS AND VEGETABLES         Depression Screen    08/30/2022    8:20 AM 05/24/2022    8:18 AM 11/21/2021    1:16 PM 08/21/2021    9:05 AM 05/17/2021    8:50 AM 02/02/2021    1:22 PM 09/07/2020    8:44 AM  PHQ 2/9 Scores  PHQ - 2 Score 0 3 0 0 0 0 1  PHQ- 9 Score 0 10 0  0 6     Fall Risk    08/30/2022    8:23 AM 05/24/2022    8:18 AM 08/21/2021    9:04 AM 05/17/2021    8:50 AM 02/02/2021  9:58 AM  Fall Risk   Falls in the past year? 0 0 0 0 0  Number falls in past yr: 0 0  0 0  Injury with Fall? 0 0  0 0  Risk for fall due to : No Fall Risks No Fall Risks Medication side effect    Follow up Falls prevention discussed Falls evaluation completed Falls evaluation completed;Education provided;Falls prevention discussed Falls evaluation completed     FALL RISK PREVENTION PERTAINING TO THE HOME:  Any stairs in or around the home? No  If so, are there any without handrails? No  Home free of loose throw rugs in walkways, pet beds, electrical cords, etc? Yes  Adequate lighting in your home to reduce risk of falls? Yes   ASSISTIVE DEVICES UTILIZED TO PREVENT FALLS:  Life alert? No  Use of a cane, walker or w/c? No  Grab bars in the bathroom? Yes  Shower chair or bench in shower? Yes  Elevated toilet seat or a handicapped toilet? Yes   TIMED UP AND GO:  Was the test performed? Yes .  Length of time to ambulate 10 feet:  4 sec.   Gait steady and fast without use of assistive device  Cognitive Function:        08/30/2022    8:26 AM 05/15/2018    1:00 PM 04/07/2017    9:39 AM  6CIT Screen  What Year? 0 points 0 points 0 points  What month? 0 points 3 points 0 points  What time? 0 points 0 points 0 points  Count back from 20 2 points 0 points 0 points  Months in reverse 0 points 0 points 4 points  Repeat phrase 2 points 0 points 2 points  Total Score 4 points 3 points 6 points    Immunizations Immunization History  Administered Date(s) Administered   Fluad Quad(high Dose 65+) 08/13/2019, 09/07/2020, 11/21/2021   HIB (PRP-T) 11/04/2018   Influenza, High Dose Seasonal PF 08/23/2015, 09/16/2016, 10/15/2017, 08/24/2018   Influenza, Seasonal, Injecte, Preservative Fre 08/27/2012   Influenza,inj,Quad PF,6+ Mos 08/23/2013, 09/05/2014   Meningococcal Mcv4o 11/04/2018   PFIZER(Purple Top)SARS-COV-2 Vaccination 01/11/2020, 02/01/2020   Pneumococcal Conjugate-13 11/17/2014   Pneumococcal Polysaccharide-23 12/16/2008, 11/04/2018   Zoster Recombinat (Shingrix) 07/17/2022   Zoster, Live 06/13/2009    TDAP status: Due, Education has been provided regarding the importance of this vaccine. Advised may receive this vaccine at local pharmacy or Health Dept. Aware to provide a copy of the vaccination record if obtained from local pharmacy or Health Dept. Verbalized acceptance and understanding.  Flu Vaccine status: Up to date  Pneumococcal vaccine status: Up to date  Covid-19 vaccine status: Completed vaccines  Qualifies for Shingles Vaccine? Yes   Zostavax completed Yes   Shingrix Completed?: No.    Education has been provided regarding the importance of this vaccine. Patient has been advised to call insurance company to determine out of pocket expense if they have not yet received this vaccine. Advised may also receive vaccine at local pharmacy or Health Dept. Verbalized acceptance and  understanding.  Screening Tests Health Maintenance  Topic Date Due   COVID-19 Vaccine (3 - Pfizer risk series) 02/29/2020   INFLUENZA VACCINE  07/16/2022   TETANUS/TDAP  12/16/2026 (Originally 11/28/1954)   Zoster Vaccines- Shingrix (2 of 2) 09/11/2022   Pneumonia Vaccine 64+ Years old  Completed   HPV VACCINES  Aged Out    Health Maintenance  Health Maintenance Due  Topic Date Due   COVID-19 Vaccine (3 -  Pfizer risk series) 02/29/2020   INFLUENZA VACCINE  07/16/2022    Colorectal cancer screening: No longer required.   Lung Cancer Screening: (Low Dose CT Chest recommended if Age 56-80 years, 30 pack-year currently smoking OR have quit w/in 15years.) does not qualify.   Additional Screening:  Hepatitis C Screening: does not qualify; Completed no  Vision Screening: Recommended annual ophthalmology exams for early detection of glaucoma and other disorders of the eye. Is the patient up to date with their annual eye exam?  Yes  Who is the provider or what is the name of the office in which the patient attends annual eye exams? Dr.Porfilio If pt is not established with a provider, would they like to be referred to a provider to establish care? No .   Dental Screening: Recommended annual dental exams for proper oral hygiene  Community Resource Referral / Chronic Care Management: CRR required this visit?  No   CCM required this visit?  No      Plan:     I have personally reviewed and noted the following in the patient's chart:   Medical and social history Use of alcohol, tobacco or illicit drugs  Current medications and supplements including opioid prescriptions. Patient is not currently taking opioid prescriptions. Functional ability and status Nutritional status Physical activity Advanced directives List of other physicians Hospitalizations, surgeries, and ER visits in previous 12 months Vitals Screenings to include cognitive, depression, and falls Referrals and  appointments  In addition, I have reviewed and discussed with patient certain preventive protocols, quality metrics, and best practice recommendations. A written personalized care plan for preventive services as well as general preventive health recommendations were provided to patient.     Dionisio David, LPN   [id number]/[id number]   Nurse Notes: none

## 2022-09-19 DIAGNOSIS — M65331 Trigger finger, right middle finger: Secondary | ICD-10-CM | POA: Diagnosis not present

## 2022-09-19 DIAGNOSIS — Z96651 Presence of right artificial knee joint: Secondary | ICD-10-CM | POA: Diagnosis not present

## 2022-10-11 ENCOUNTER — Telehealth: Payer: Self-pay | Admitting: Internal Medicine

## 2022-10-11 NOTE — Telephone Encounter (Signed)
   Patient Name: Anthony Mosley  DOB: 07-31-1935 MRN: [id number]  Primary Cardiologist: [name], MD  Chart reviewed as part of pre-operative protocol coverage.   Simple dental extractions (i.e. 1-2 teeth) or cleanings are considered low risk procedures per guidelines and generally do not require any specific cardiac clearance. It is also generally accepted that for simple extractions and dental cleanings, there is no need to interrupt blood thinner therapy.  SBE prophylaxis is not required for the patient from a cardiac standpoint.  Patient medication list included for dentist reference below.  Outpatient Encounter Medications as of 10/11/2022  Medication Sig   ACCU-CHEK GUIDE test strip Check blood sugar 1 x daily   apixaban (ELIQUIS) 5 MG TABS tablet Take 1 tablet (5 mg total) by mouth 2 (two) times daily.   atorvastatin (LIPITOR) 80 MG tablet TAKE 1 TABLET EVERY DAY   B COMPLEX VITAMINS PO Take 1 tablet by mouth daily.    calcium carbonate (TUMS - DOSED IN MG ELEMENTAL CALCIUM) 500 MG chewable tablet Chew 1 tablet by mouth as needed for indigestion or heartburn.   Carboxymethylcellul-Glycerin (LUBRICATING EYE DROPS OP) Apply 1 drop to eye as needed (dry eyes).    celecoxib (CELEBREX) 200 MG capsule Take 1 capsule (200 mg total) by mouth 2 (two) times daily.   cholecalciferol (VITAMIN D3) 25 MCG (1000 UNIT) tablet Take 1,000 Units by mouth daily.   COLLAGEN PO Take 1 Scoop by mouth daily.   diclofenac sodium (VOLTAREN) 1 % GEL Apply 4 g topically 4 (four) times daily. To the knee if needed   fluorouracil (EFUDEX) 5 % cream Apply to cheeks and temples twice a day x 7 days as tolerated.   fluticasone (FLONASE) 50 MCG/ACT nasal spray Place 1 spray into both nostrils daily as needed (congestion).   furosemide (LASIX) 20 MG tablet TAKE 1 TABLET EVERY DAY AS NEEDED   Guaifenesin 1200 MG TB12 Take 1,200 mg by mouth at bedtime.   metroNIDAZOLE (METROGEL) 0.75 % gel Apply to nose  and cheeks every night for rosacea.   Potassium 99 MG TABS Take 99 mg by mouth daily. Leg cramps   SHINGRIX injection    Tetrahydroz-Dextran-PEG-Povid (EYE DROPS ADVANCED RELIEF OP) Apply 1 drop to eye as needed.   triamcinolone cream (KENALOG) 0.1 % Apply 1 Application topically 2 (two) times daily as needed.   I will route this recommendation to the requesting party via Epic fax function and remove from pre-op pool.  Please call with questions.  Loel Dubonnet, NP 10/11/2022, 3:48 PM

## 2022-10-11 NOTE — Telephone Encounter (Signed)
   Pre-operative Risk Assessment    Patient Name: Anthony Mosley  DOB: 1935-10-13 MRN: [id number]     Request for Surgical Clearance    Procedure:   deep cleaning, filling  Date of Surgery:  Clearance TBD                                 Surgeon:  Dr Erasmo Leventhal Group or Practice Name:  [facility] Phone number:  [phone] Fax number:  [phone]   Type of Clearance Requested:   - Medical    Type of Anesthesia:  Not Indicated   Additional requests/questions:  Please advise surgeon/provider what medications should be held.  SignedAce Gins   10/11/2022, 3:31 PM

## 2022-10-14 ENCOUNTER — Encounter (INDEPENDENT_AMBULATORY_CARE_PROVIDER_SITE_OTHER): Payer: Self-pay

## 2022-10-18 ENCOUNTER — Telehealth: Payer: Self-pay | Admitting: Internal Medicine

## 2022-10-18 NOTE — Telephone Encounter (Signed)
Patient is requesting samples of Eliquis mail order has not arrived will run out over the weekend. Patient states he called 3times to [facility] they told him 7-10days each time & still has not received refill.

## 2022-10-18 NOTE — Telephone Encounter (Signed)
Medication Samples have been provided to the patient.  Drug name: Eliquis        Strength: 2 boxes         Qty: 2 boxes   LOT: [id number]   Exp.Date: May 2025  Patient showed up here in our office requesting samples on his Eliquis. He states that he did not qualify for assistance through the program. Reviewed that we are not able to continue providing samples. Looked in files and I do not see completed application for him. Reviewed he could try buying one prescription to last through December and then the new year starts over and should be a little more affordable. Other option would be to switch to something more affordable. He prefers to pay for one prescription for now. Instructed him to please let us know if we can assist any further with his medication.

## 2022-11-21 ENCOUNTER — Telehealth: Payer: Self-pay | Admitting: *Deleted

## 2022-11-21 NOTE — Telephone Encounter (Signed)
Office called asking that clearance be refax to their office. Please office

## 2022-11-21 NOTE — Telephone Encounter (Signed)
  Patient Consent for Virtual Visit         Anthony Mosley has provided verbal consent on 11/21/2022 for a virtual visit (video or telephone).   CONSENT FOR VIRTUAL VISIT FOR:  Anthony Mosley  By participating in this virtual visit I agree to the following:  I hereby voluntarily request, consent and authorize [place] and its employed or contracted physicians, physician assistants, nurse practitioners or other licensed health care professionals (the Practitioner), to provide me with telemedicine health care services (the "Services") as deemed necessary by the treating Practitioner. I acknowledge and consent to receive the Services by the Practitioner via telemedicine. I understand that the telemedicine visit will involve communicating with the Practitioner through live audiovisual communication technology and the disclosure of certain medical information by electronic transmission. I acknowledge that I have been given the opportunity to request an in-person assessment or other available alternative prior to the telemedicine visit and am voluntarily participating in the telemedicine visit.  I understand that I have the right to withhold or withdraw my consent to the use of telemedicine in the course of my care at any time, without affecting my right to future care or treatment, and that the Practitioner or I may terminate the telemedicine visit at any time. I understand that I have the right to inspect all information obtained and/or recorded in the course of the telemedicine visit and may receive copies of available information for a reasonable fee.  I understand that some of the potential risks of receiving the Services via telemedicine include:  Delay or interruption in medical evaluation due to technological equipment failure or disruption; Information transmitted may not be sufficient (e.g. poor resolution of images) to allow for appropriate medical decision making by the  Practitioner; and/or  In rare instances, security protocols could fail, causing a breach of personal health information.  Furthermore, I acknowledge that it is my responsibility to provide information about my medical history, conditions and care that is complete and accurate to the best of my ability. I acknowledge that Practitioner's advice, recommendations, and/or decision may be based on factors not within their control, such as incomplete or inaccurate data provided by me or distortions of diagnostic images or specimens that may result from electronic transmissions. I understand that the practice of medicine is not an exact science and that Practitioner makes no warranties or guarantees regarding treatment outcomes. I acknowledge that a copy of this consent can be made available to me via my patient portal ([place] [facility]), or I can request a printed copy by calling the office of [facility].    I understand that my insurance will be billed for this visit.   I have read or had this consent read to me. I understand the contents of this consent, which adequately explains the benefits and risks of the Services being provided via telemedicine.  I have been provided ample opportunity to ask questions regarding this consent and the Services and have had my questions answered to my satisfaction. I give my informed consent for the services to be provided through the use of telemedicine in my medical care

## 2022-11-25 ENCOUNTER — Ambulatory Visit (INDEPENDENT_AMBULATORY_CARE_PROVIDER_SITE_OTHER): Payer: Medicare HMO | Admitting: Family Medicine

## 2022-11-25 ENCOUNTER — Encounter: Payer: Self-pay | Admitting: Family Medicine

## 2022-11-25 VITALS — BP 113/60 | HR 57 | Ht 73.0 in | Wt 194.0 lb

## 2022-11-25 DIAGNOSIS — I480 Paroxysmal atrial fibrillation: Secondary | ICD-10-CM

## 2022-11-25 DIAGNOSIS — E785 Hyperlipidemia, unspecified: Secondary | ICD-10-CM | POA: Diagnosis not present

## 2022-11-25 DIAGNOSIS — R7303 Prediabetes: Secondary | ICD-10-CM

## 2022-11-25 DIAGNOSIS — Z23 Encounter for immunization: Secondary | ICD-10-CM | POA: Diagnosis not present

## 2022-11-25 DIAGNOSIS — I5020 Unspecified systolic (congestive) heart failure: Secondary | ICD-10-CM | POA: Diagnosis not present

## 2022-11-25 DIAGNOSIS — F330 Major depressive disorder, recurrent, mild: Secondary | ICD-10-CM

## 2022-11-25 NOTE — Progress Notes (Unsigned)
Subjective:    Patient ID: [name], male    DOB: Jan 02, 1935, 87 y.o.   MRN: [id number]  GUIDO COMP is a 87 y.o. male presenting on 11/25/2022 for Memory Loss and Insomnia (States it's OK... Says he wakes up 2-3 times a night to go to the bathroom. )   HPI  Sinus / Congestion Reports recently with persistent sinus drainage settles in throat. Admits cough to clear mucus that builds up Tried mucinex x 1 with some results now on x 2 a day improved Denies fevers chills sweats, dyspnea  Osteoarthritis Bilateral   Chronic Knee Pain, bilateral lower ext S/p R knee TKR =- Dr Marry Guan [facility] Improved overall after completing physical therapy post op   CKD-III Stable, previous issue. Last labs reviewed Cr 1.32 to 1.42 range, last 05/2022   Pre-Diabetes Last A1c 5.9 (05/2022) He checks CBG occasional, 1 x  per week. Usually controlled < 100 fasting.   Major depression recurrent, mild Currently not on therapy Mood is stable, see below   Insomnia He will go to bed at 9pm and will get out of bed 6am, he may wake up 2-3 times overnight, sometimes to urinate etc or shoulder pain wake him up. He tried melatonin, no longer on med.   Memory Loss / Forgetful Tried Prevagen for several months without improvement. He did not notice significant improvement, then discontinued it.  Aortic Atherosclerosis On imaging   HFrEF PAF Followed by Cardiology On Anticoagulation    Allergic Reaction vs Insect Bite Treated for multiple axillary and chest wall / groin rash, itchy. He was given Prednisone, Hydroxyzine, Triamcinolone 0.1% ***  Left Upper back / Flank Itching vs Rash Reports recently started as insect bite and started itching, it has improved.   Health Maintenance: Flu Shot today     08/30/2022    8:20 AM 05/24/2022    8:18 AM 11/21/2021    1:16 PM  Depression screen PHQ 2/9  Decreased Interest 0 0 0  Down, Depressed, Hopeless 0 3 0  PHQ - 2 Score 0 3 0  Altered  sleeping 0 3 0  Tired, decreased energy 0 0 0  Change in appetite 0 0 0  Feeling bad or failure about yourself  0 0 0  Trouble concentrating 0 2 0  Moving slowly or fidgety/restless 0 2 0  Suicidal thoughts 0 0 0  PHQ-9 Score 0 10 0  Difficult doing work/chores Not difficult at all Not difficult at all Not difficult at all    Social History   Tobacco Use   Smoking status: Former    Packs/day: 1.50    Years: 20.00    Total pack years: 30.00    Types: Cigarettes    Quit date: 57    Years since quitting: 36.9   Smokeless tobacco: Former    Types: Chew    Quit date: 2000   Tobacco comments:    smoking cessation materials not required  Scientific laboratory technician Use: Never used  Substance Use Topics   Alcohol use: No    Alcohol/week: 0.0 standard drinks of alcohol   Drug use: No    Review of Systems Per HPI unless specifically indicated above     Objective:    BP 113/60   Pulse (!) 57   Ht '6\' 1"'$  (1.854 m)   Wt 194 lb (88 kg)   SpO2 98%   BMI 25.60 kg/m   Wt Readings from Last 3 Encounters:  11/25/22 194 lb (88 kg)  08/30/22 190 lb 3.2 oz (86.3 kg)  08/08/22 191 lb (86.6 kg)    Physical Exam Results for orders placed or performed during the hospital encounter of 07/29/22  I-STAT creatinine  Result Value Ref Range   Creatinine, Ser 1.50 (H) 0.61 - 1.24 mg/dL      Assessment & Plan:   Problem List Items Addressed This Visit   None   No orders of the defined types were placed in this encounter.     Follow up plan: No follow-ups on file.  Future labs ordered for ***   Nobie Putnam, DO [facility] 11/25/2022, 8:14 AM

## 2022-11-25 NOTE — Patient Instructions (Addendum)
Thank you for coming to the office today.  Few small bites / scratches on Left upper back flank, I would suggest using the topical Triamcinolone Cream for itching and healing, can use it twice a day for 1-2 weeks.  No other changes to medications today  Flu Shot today  DUE for FASTING BLOOD WORK (no food or drink after midnight before the lab appointment, only water or coffee without cream/sugar on the morning of)  SCHEDULE "Lab Only" visit in the morning at the clinic for lab draw in 6 MONTHS   - Make sure Lab Only appointment is at about 1 week before your next appointment, so that results will be available  For Lab Results, once available within 2-3 days of blood draw, you can can log in to MyChart online to view your results and a brief explanation. Also, we can discuss results at next follow-up visit.   Please schedule a Follow-up Appointment to: Return in about 6 months (around 05/27/2023) for 6 month fasting lab only then 1 week later Annual Physical.  If you have any other questions or concerns, please feel free to call the office or send a message through [place]. You may also schedule an earlier appointment if necessary.  Additionally, you may be receiving a survey about your experience at our office within a few days to 1 week by e-mail or mail. We value your feedback.  Nobie Putnam, DO [place]

## 2022-11-26 ENCOUNTER — Other Ambulatory Visit: Payer: Self-pay | Admitting: Family Medicine

## 2022-11-26 ENCOUNTER — Encounter: Payer: Self-pay | Admitting: Family Medicine

## 2022-11-26 DIAGNOSIS — R351 Nocturia: Secondary | ICD-10-CM

## 2022-11-26 DIAGNOSIS — E785 Hyperlipidemia, unspecified: Secondary | ICD-10-CM

## 2022-11-26 DIAGNOSIS — Z0000 Encounter for general adult medical examination without abnormal findings: Secondary | ICD-10-CM

## 2022-11-26 DIAGNOSIS — R7303 Prediabetes: Secondary | ICD-10-CM

## 2022-11-26 DIAGNOSIS — I480 Paroxysmal atrial fibrillation: Secondary | ICD-10-CM

## 2022-11-26 NOTE — Assessment & Plan Note (Signed)
Followed by Cardiology On anticoagulation

## 2022-11-26 NOTE — Assessment & Plan Note (Signed)
STable euvolemic Followed by Cardiology On medication management

## 2022-11-26 NOTE — Assessment & Plan Note (Signed)
Continue statin therapy.

## 2022-11-26 NOTE — Assessment & Plan Note (Signed)
Well-controlled Pre-DM with A1c 5.9 previuosly  Plan:  1. Not on any therapy currently  2. Encourage improved lifestyle - low carb, low sugar diet, reduce portion size, continue improving regular exercise

## 2022-12-17 ENCOUNTER — Other Ambulatory Visit: Payer: Self-pay | Admitting: Internal Medicine

## 2023-01-03 DIAGNOSIS — Z0100 Encounter for examination of eyes and vision without abnormal findings: Secondary | ICD-10-CM | POA: Diagnosis not present

## 2023-01-03 DIAGNOSIS — H02055 Trichiasis without entropian left lower eyelid: Secondary | ICD-10-CM | POA: Diagnosis not present

## 2023-01-03 DIAGNOSIS — H1132 Conjunctival hemorrhage, left eye: Secondary | ICD-10-CM | POA: Diagnosis not present

## 2023-01-03 DIAGNOSIS — H02059 Trichiasis without entropian unspecified eye, unspecified eyelid: Secondary | ICD-10-CM | POA: Diagnosis not present

## 2023-01-03 DIAGNOSIS — D3132 Benign neoplasm of left choroid: Secondary | ICD-10-CM | POA: Diagnosis not present

## 2023-01-03 DIAGNOSIS — H43813 Vitreous degeneration, bilateral: Secondary | ICD-10-CM | POA: Diagnosis not present

## 2023-01-16 DIAGNOSIS — H52209 Unspecified astigmatism, unspecified eye: Secondary | ICD-10-CM | POA: Diagnosis not present

## 2023-01-16 DIAGNOSIS — H5203 Hypermetropia, bilateral: Secondary | ICD-10-CM | POA: Diagnosis not present

## 2023-01-16 DIAGNOSIS — H524 Presbyopia: Secondary | ICD-10-CM | POA: Diagnosis not present

## 2023-02-04 ENCOUNTER — Ambulatory Visit: Payer: Medicare HMO | Admitting: Dermatology

## 2023-02-04 VITALS — BP 148/69 | HR 57

## 2023-02-04 DIAGNOSIS — L578 Other skin changes due to chronic exposure to nonionizing radiation: Secondary | ICD-10-CM | POA: Diagnosis not present

## 2023-02-04 DIAGNOSIS — L570 Actinic keratosis: Secondary | ICD-10-CM

## 2023-02-04 DIAGNOSIS — D485 Neoplasm of uncertain behavior of skin: Secondary | ICD-10-CM

## 2023-02-04 DIAGNOSIS — L720 Epidermal cyst: Secondary | ICD-10-CM

## 2023-02-04 DIAGNOSIS — Z85828 Personal history of other malignant neoplasm of skin: Secondary | ICD-10-CM

## 2023-02-04 DIAGNOSIS — L821 Other seborrheic keratosis: Secondary | ICD-10-CM

## 2023-02-04 DIAGNOSIS — L719 Rosacea, unspecified: Secondary | ICD-10-CM

## 2023-02-04 DIAGNOSIS — L820 Inflamed seborrheic keratosis: Secondary | ICD-10-CM

## 2023-02-04 DIAGNOSIS — D229 Melanocytic nevi, unspecified: Secondary | ICD-10-CM

## 2023-02-04 DIAGNOSIS — L814 Other melanin hyperpigmentation: Secondary | ICD-10-CM | POA: Diagnosis not present

## 2023-02-04 DIAGNOSIS — Z1283 Encounter for screening for malignant neoplasm of skin: Secondary | ICD-10-CM | POA: Diagnosis not present

## 2023-02-04 HISTORY — DX: Actinic keratosis: L57.0

## 2023-02-04 NOTE — Progress Notes (Signed)
Follow-Up Visit   Subjective  Anthony Mosley is a 88 y.o. male who presents for the following: UBSE (Hx SCC left inferior mandible. Patient with a spot at back of scalp that his son noticed while giving patient a hair cut. ) and Rosacea (Patient uses metronidazole but not daily. ). He has some itchy growths on hand, back and face.  The patient presents for Upper Body Skin Exam (UBSE) for skin cancer screening and mole check.  The patient has spots, moles and lesions to be evaluated, some may be new or changing and the patient has concerns that these could be cancer.   The following portions of the chart were reviewed this encounter and updated as appropriate:       Review of Systems:  No other skin or systemic complaints except as noted in HPI or Assessment and Plan.  Objective  Well appearing patient in no apparent distress; mood and affect are within normal limits.  All skin waist up examined.  right occipital scalp 0.8 cm firm blue white subcutaneous nodule  left lateral cheek x 4, left mandible x 1 (5) Erythematous thin papules/macules with gritty scale.   right sideburn x 1, right hand dorsum x 1, right spinal lower back x 2 (4) Erythematous stuck-on, waxy papule  face Mild erythema with telangiectasias  Left Posterior Neck 0.5 mm pink brown macule Lentigo vs SK r/o Atypia       Assessment & Plan  Epidermal inclusion cyst right occipital scalp  Benign-appearing. Exam most consistent with an epidermal inclusion cyst. Discussed that a cyst is a benign growth that can grow over time and sometimes get irritated or inflamed. Recommend observation if it is not bothersome. Discussed option of surgical excision to remove it if it is growing, symptomatic, or other changes noted. Please call for new or changing lesions so they can be evaluated.    AK (actinic keratosis) (5) left lateral cheek x 4, left mandible x 1  Actinic keratoses are precancerous spots that  appear secondary to cumulative UV radiation exposure/sun exposure over time. They are chronic with expected duration over 1 year. A portion of actinic keratoses will progress to squamous cell carcinoma of the skin. It is not possible to reliably predict which spots will progress to skin cancer and so treatment is recommended to prevent development of skin cancer.  Recommend daily broad spectrum sunscreen SPF 30+ to sun-exposed areas, reapply every 2 hours as needed.  Recommend staying in the shade or wearing long sleeves, sun glasses (UVA+UVB protection) and wide brim hats (4-inch brim around the entire circumference of the hat). Call for new or changing lesions.  Destruction of lesion - left lateral cheek x 4, left mandible x 1  Destruction method: cryotherapy   Informed consent: discussed and consent obtained   Lesion destroyed using liquid nitrogen: Yes   Region frozen until ice ball extended beyond lesion: Yes   Outcome: patient tolerated procedure well with no complications   Post-procedure details: wound care instructions given   Additional details:  Prior to procedure, discussed risks of blister formation, small wound, skin dyspigmentation, or rare scar following cryotherapy. Recommend Vaseline ointment to treated areas while healing.   Inflamed seborrheic keratosis (4) right sideburn x 1, right hand dorsum x 1, right spinal lower back x 2  Symptomatic, irritating, patient would like treated.   Destruction of lesion - right sideburn x 1, right hand dorsum x 1, right spinal lower back x 2  Destruction method:  cryotherapy   Informed consent: discussed and consent obtained   Lesion destroyed using liquid nitrogen: Yes   Region frozen until ice ball extended beyond lesion: Yes   Outcome: patient tolerated procedure well with no complications   Post-procedure details: wound care instructions given   Additional details:  Prior to procedure, discussed risks of blister formation, small  wound, skin dyspigmentation, or rare scar following cryotherapy. Recommend Vaseline ointment to treated areas while healing.   Rosacea face  Chronic condition with duration or expected duration over one year. Currently well-controlled.   Rosacea is a chronic progressive skin condition usually affecting the face of adults, causing redness and/or acne bumps. It is treatable but not curable. It sometimes affects the eyes (ocular rosacea) as well. It may respond to topical and/or systemic medication and can flare with stress, sun exposure, alcohol, exercise, topical steroids (including hydrocortisone/cortisone 10) and some foods.  Daily application of broad spectrum spf 30+ sunscreen to face is recommended to reduce flares.  Continue metronidazole 0.75% gel 1-2 times daily to face for rosacea.   Related Medications metroNIDAZOLE (METROGEL) 0.75 % gel Apply to nose and cheeks every night for rosacea.  Neoplasm of uncertain behavior of skin Left Posterior Neck  Epidermal / dermal shaving  Lesion diameter (cm):  0.6 Informed consent: discussed and consent obtained   Patient was prepped and draped in usual sterile fashion: area prepped with alcohol. Anesthesia: the lesion was anesthetized in a standard fashion   Anesthetic:  1% lidocaine w/ epinephrine 1-100,000 buffered w/ 8.4% NaHCO3 Instrument used: flexible razor blade   Hemostasis achieved with: pressure, aluminum chloride and electrodesiccation   Outcome: patient tolerated procedure well   Post-procedure details: wound care instructions given   Post-procedure details comment:  Ointment and small bandage applied  Specimen 1 - Surgical pathology Differential Diagnosis: Lentigo vs SK r/o Atypia  Check Margins: No 0.5 mm pink brown macule    History of Squamous Cell Carcinoma of the Skin - No evidence of recurrence today - Recommend regular full body skin exams - Recommend daily broad spectrum sunscreen SPF 30+ to sun-exposed  areas, reapply every 2 hours as needed.  - Call if any new or changing lesions are noted between office visits  Lentigines - Scattered tan macules - Due to sun exposure - Benign-appearing, observe - Recommend daily broad spectrum sunscreen SPF 30+ to sun-exposed areas, reapply every 2 hours as needed. - Call for any changes  Seborrheic Keratoses - Stuck-on, waxy, tan-brown papules and/or plaques  - Benign-appearing - Discussed benign etiology and prognosis. - Observe - Call for any changes  Melanocytic Nevi - Tan-brown and/or pink-flesh-colored symmetric macules and papules - Benign appearing on exam today - Observation - Call clinic for new or changing moles - Recommend daily use of broad spectrum spf 30+ sunscreen to sun-exposed areas.   Hemangiomas - Red papules - Discussed benign nature - Observe - Call for any changes  Actinic Damage - Chronic condition, secondary to cumulative UV/sun exposure - diffuse scaly erythematous macules with underlying dyspigmentation - Recommend daily broad spectrum sunscreen SPF 30+ to sun-exposed areas, reapply every 2 hours as needed.  - Staying in the shade or wearing long sleeves, sun glasses (UVA+UVB protection) and wide brim hats (4-inch brim around the entire circumference of the hat) are also recommended for sun protection.  - Call for new or changing lesions.  Skin cancer screening performed today.  Return in about 6 months (around 08/05/2023) for Hx AK.  Graciella Belton, RMA,  am acting as scribe for Brendolyn Patty, MD .  Documentation: I have reviewed the above documentation for accuracy and completeness, and I agree with the above.  Brendolyn Patty MD

## 2023-02-04 NOTE — Patient Instructions (Addendum)
Wound Care Instructions  Cleanse wound gently with soap and water once a day then pat dry with clean gauze. Apply a thin coat of Petrolatum (petroleum jelly, "Vaseline") over the wound (unless you have an allergy to this). We recommend that you use a new, sterile tube of Vaseline. Do not pick or remove scabs. Do not remove the yellow or white "healing tissue" from the base of the wound.  Cover the wound with fresh, clean, nonstick gauze and secure with paper tape. You may use Band-Aids in place of gauze and tape if the wound is small enough, but would recommend trimming much of the tape off as there is often too much. Sometimes Band-Aids can irritate the skin.  You should call the office for your biopsy report after 1 week if you have not already been contacted.  If you experience any problems, such as abnormal amounts of bleeding, swelling, significant bruising, significant pain, or evidence of infection, please call the office immediately.  FOR ADULT SURGERY PATIENTS: If you need something for pain relief you may take 1 extra strength Tylenol (acetaminophen) AND 2 Ibuprofen (295m each) together every 4 hours as needed for pain. (do not take these if you are allergic to them or if you have a reason you should not take them.) Typically, you may only need pain medication for 1 to 3 days.    Seborrheic Keratosis  What causes seborrheic keratoses? Seborrheic keratoses are harmless, common skin growths that first appear during adult life.  As time goes by, more growths appear.  Some people may develop a large number of them.  Seborrheic keratoses appear on both covered and uncovered body parts.  They are not caused by sunlight.  The tendency to develop seborrheic keratoses can be inherited.  They vary in color from skin-colored to gray, brown, or even black.  They can be either smooth or have a rough, warty surface.   Seborrheic keratoses are superficial and look as if they were stuck on the skin.   Under the microscope this type of keratosis looks like layers upon layers of skin.  That is why at times the top layer may seem to fall off, but the rest of the growth remains and re-grows.    Treatment Seborrheic keratoses do not need to be treated, but can easily be removed in the office.  Seborrheic keratoses often cause symptoms when they rub on clothing or jewelry.  Lesions can be in the way of shaving.  If they become inflamed, they can cause itching, soreness, or burning.  Removal of a seborrheic keratosis can be accomplished by freezing, burning, or surgery. If any spot bleeds, scabs, or grows rapidly, please return to have it checked, as these can be an indication of a skin cancer.  Cryotherapy Aftercare  Wash gently with soap and water everyday.   Apply Vaseline and Band-Aid daily until healed.   Melanoma ABCDEs  Melanoma is the most dangerous type of skin cancer, and is the leading cause of death from skin disease.  You are more likely to develop melanoma if you: Have light-colored skin, light-colored eyes, or red or blond hair Spend a lot of time in the sun Tan regularly, either outdoors or in a tanning bed Have had blistering sunburns, especially during childhood Have a close family member who has had a melanoma Have atypical moles or large birthmarks  Early detection of melanoma is key since treatment is typically straightforward and cure rates are extremely high if we catch  it early.   The first sign of melanoma is often a change in a mole or a new dark spot.  The ABCDE system is a way of remembering the signs of melanoma.  A for asymmetry:  The two halves do not match. B for border:  The edges of the growth are irregular. C for color:  A mixture of colors are present instead of an even brown color. D for diameter:  Melanomas are usually (but not always) greater than 4m - the size of a pencil eraser. E for evolution:  The spot keeps changing in size, shape, and  color.  Please check your skin once per month between visits. You can use a small mirror in front and a large mirror behind you to keep an eye on the back side or your body.   If you see any new or changing lesions before your next follow-up, please call to schedule a visit.  Please continue daily skin protection including broad spectrum sunscreen SPF 30+ to sun-exposed areas, reapplying every 2 hours as needed when you're outdoors.    Due to recent changes in healthcare laws, you may see results of your pathology and/or laboratory studies on MyChart before the doctors have had a chance to review them. We understand that in some cases there may be results that are confusing or concerning to you. Please understand that not all results are received at the same time and often the doctors may need to interpret multiple results in order to provide you with the best plan of care or course of treatment. Therefore, we ask that you please give u[place]2 business days to thoroughly review all your results before contacting the office for clarification. Should we see a critical lab result, you will be contacted sooner.   If You Need Anything After Your Visit  If you have any questions or concerns for your doctor, please call our main line at [phone] press option 4 to reach your doctor's medical assistant. If no one answers, please leave a voicemail as directed and we will return your call as soon as possible. Messages left after 4 pm will be answered the following business day.   You may also send [place] message via [place] We typically respond to MyChart messages within 1-2 business days.  For prescription refills, please ask your pharmacy to contact our office. Our fax number is [phone]  If you have an urgent issue when the clinic is closed that cannot wait until the next business day, you can page your doctor at the number below.    Please note that while we do our best to be available for  urgent issues outside of office hours, we are not available 24/7.   If you have an urgent issue and are unable to reach u[place] you may choose to seek medical care at your doctor's office, retail clinic, urgent care center, or emergency room.  If you have a medical emergency, please immediately call 911 or go to the emergency department.  Pager Numbers  - Dr. KNehemiah Massed [phone] - Dr. MLaurence Ferrari [phone] - Dr. SNicole Kindred [phone] In the event of inclement weather, please call our main line at [phone] an update on the status of any delays or closures.  Dermatology Medication Tips: Please keep the boxes that topical medications come in in order to help keep track of the instructions about where and how to use these. Pharmacies typically print the medication instructions only on the  boxes and not directly on the medication tubes.   If your medication is too expensive, please contact our office at [phone] option 4 or send [place] a message through [place].   We are unable to tell what your co-pay for medications will be in advance as this is different depending on your insurance coverage. However, we may be able to find a substitute medication at lower cost or fill out paperwork to get insurance to cover a needed medication.   If a prior authorization is required to get your medication covered by your insurance company, please allow [place] 1-2 business days to complete this process.  Drug prices often vary depending on where the prescription is filled and some pharmacies may offer cheaper prices.  The website www.goodrx.com contains coupons for medications through different pharmacies. The prices here do not account for what the cost may be with help from insurance (it may be cheaper with your insurance), but the website can give you the price if you did not use any insurance.  - You can print the associated coupon and take it with your prescription to the pharmacy.  - You may also  stop by our office during regular business hours and pick up a GoodRx coupon card.  - If you need your prescription sent electronically to a different pharmacy, notify our office through [facility] or by phone at [phone] option 4.     Si Usted Necesita Algo Despus de Su Visita  Tambin puede enviarnos un mensaje a travs de Pharmacist, community. Por lo general respondemos a los mensajes de MyChart en el transcurso de 1 a 2 das hbiles.  Para renovar recetas, por favor pida a su farmacia que se ponga en contacto con nuestra oficina. Harland Dingwall de fax es [place] [phone].  Si tiene un asunto urgente cuando la clnica est cerrada y que no puede esperar hasta el siguiente da hbil, puede llamar/localizar a su doctor(a) al nmero que aparece a continuacin.   Por favor, tenga en cuenta que aunque hacemos todo lo posible para estar disponibles para asuntos urgentes fuera del horario de [place], no estamos disponibles las 24 horas del da, los 7 das de la [place].   Si tiene un problema urgente y no puede comunicarse con nosotros, puede optar por buscar atencin mdica  en el consultorio de su doctor(a), en una clnica privada, en un centro de atencin urgente o en una sala de emergencias.  Si tiene Engineering geologist, por favor llame inmediatamente al 911 o vaya a la sala de emergencias.  Nmeros de bper  - Dr. Nehemiah Massed: [phone]  - Dra. Moye: [phone]  - Dra. Nicole Kindred: [phone]  En caso de inclemencias del [place], por favor llame a Johnsie Kindred principal al [phone] para una actualizacin sobre el [place] de cualquier retraso o cierre.  Consejos para la medicacin en dermatologa: Por favor, guarde las cajas en las que vienen los medicamentos de uso tpico para ayudarle a seguir las instrucciones sobre dnde y cmo usarlos. Las farmacias generalmente imprimen las instrucciones del medicamento slo en las cajas y no directamente en los tubos del [place].   Si  su medicamento es muy caro, por favor, pngase en contacto con Zigmund Daniel llamando al [phone] y presione la opcin 4 o envenos un mensaje a travs de Pharmacist, community.   No podemos decirle cul ser su copago por los medicamentos por adelantado ya que esto es diferente dependiendo de la cobertura de su seguro. Sin embargo, es posible que podamos  encontrar un medicamento sustituto a Electrical engineer un formulario para que el seguro cubra el medicamento que se considera necesario.   Si se requiere una autorizacin previa para que su compaa de seguros [place] su medicamento, por favor permtanos de 1 a 2 das hbiles para completar este proceso.  Los precios de los medicamentos varan con frecuencia dependiendo del Environmental consultant de dnde se surte la receta y alguna farmacias pueden ofrecer precios ms baratos.  El sitio web www.goodrx.com tiene cupones para medicamentos de Airline pilot. Los precios aqu no tienen en cuenta lo que podra costar con la ayuda del seguro (puede ser ms barato con su seguro), pero el sitio web puede darle el precio si no utiliz Research scientist (physical sciences).  - Puede imprimir el cupn correspondiente y llevarlo con su receta a la farmacia.  - Tambin puede pasar por nuestra oficina durante el horario de atencin regular y Charity fundraiser una tarjeta de cupones de GoodRx.  - Si necesita que su receta se enve electrnicamente a una farmacia diferente, informe a nuestra oficina a travs de MyChart de [place] o por telfono llamando al [phone] y presione la opcin 4.

## 2023-02-10 ENCOUNTER — Telehealth: Payer: Self-pay

## 2023-02-10 NOTE — Telephone Encounter (Signed)
Advised pt of bx results/sh ?

## 2023-02-10 NOTE — Telephone Encounter (Signed)
-----   Message from Brendolyn Patty, MD sent at 02/10/2023 12:44 PM EST ----- Skin , left posterior neck ACTINIC [place] within sun freckle, if recurs will treat with cryotherapy - please call patient

## 2023-02-13 ENCOUNTER — Encounter: Payer: Self-pay | Admitting: Internal Medicine

## 2023-02-13 ENCOUNTER — Other Ambulatory Visit
Admission: RE | Admit: 2023-02-13 | Discharge: 2023-02-13 | Disposition: A | Discharge status: Home / Self Care | Payer: Medicare HMO | Source: Ambulatory Visit | Attending: Internal Medicine | Admitting: Internal Medicine

## 2023-02-13 ENCOUNTER — Ambulatory Visit: Payer: Medicare HMO | Attending: Internal Medicine | Admitting: Internal Medicine

## 2023-02-13 VITALS — BP 116/64 | HR 52 | Ht 73.0 in | Wt 196.0 lb

## 2023-02-13 DIAGNOSIS — I2510 Atherosclerotic heart disease of native coronary artery without angina pectoris: Secondary | ICD-10-CM

## 2023-02-13 DIAGNOSIS — I5022 Chronic systolic (congestive) heart failure: Secondary | ICD-10-CM

## 2023-02-13 DIAGNOSIS — N1831 Chronic kidney disease, stage 3a: Secondary | ICD-10-CM | POA: Diagnosis not present

## 2023-02-13 DIAGNOSIS — I712 Thoracic aortic aneurysm, without rupture, unspecified: Secondary | ICD-10-CM

## 2023-02-13 DIAGNOSIS — I480 Paroxysmal atrial fibrillation: Secondary | ICD-10-CM | POA: Diagnosis not present

## 2023-02-13 LAB — COMPREHENSIVE METABOLIC PANEL
ALT: 22 U/L (ref 0–44)
AST: 27 U/L (ref 15–41)
Albumin: 4 g/dL (ref 3.5–5.0)
Alkaline Phosphatase: 62 U/L (ref 38–126)
Anion gap: 8 (ref 5–15)
BUN: 27 mg/dL — ABNORMAL HIGH (ref 8–23)
CO2: 28 mmol/L (ref 22–32)
Calcium: 9 mg/dL (ref 8.9–10.3)
Chloride: 102 mmol/L (ref 98–111)
Creatinine, Ser: 1.41 mg/dL — ABNORMAL HIGH (ref 0.61–1.24)
GFR, Estimated: 49 mL/min — ABNORMAL LOW (ref >60)
Glucose, Bld: 80 mg/dL (ref 70–99)
Potassium: 4 mmol/L (ref 3.5–5.1)
Sodium: 138 mmol/L (ref 135–145)
Total Bilirubin: 1.8 mg/dL — ABNORMAL HIGH (ref 0.3–1.2)
Total Protein: 6.6 g/dL (ref 6.5–8.1)

## 2023-02-13 LAB — CBC
HCT: 39.9 % (ref 39.0–52.0)
Hemoglobin: 13.1 g/dL (ref 13.0–17.0)
MCH: 30.5 pg (ref 26.0–34.0)
MCHC: 32.8 g/dL (ref 30.0–36.0)
MCV: 93 fL (ref 80.0–100.0)
Platelets: 168 10*3/uL (ref 150–400)
RBC: 4.29 MIL/uL (ref 4.22–5.81)
RDW: 13.7 % (ref 11.5–15.5)
WBC: 7.3 10*3/uL (ref 4.0–10.5)
nRBC: 0 % (ref 0.0–0.2)

## 2023-02-13 NOTE — Progress Notes (Signed)
Follow-up Outpatient Visit Date: 02/13/2023  Primary Care Provider: Olin Hauser, DO [address] [address]  Chief Complaint: Follow-up HFrEF and atrial arrhythmias  HPI:  Anthony Mosley is a 88 y.o. male with history of nonobstructive coronary artery disease, chronic HFrEF with partially recovered LVEF, MAT versus PAF, pulmonary hypertension, thoracic aortic aneurysm, mild to moderate mitral regurgitation, hyperlipidemia, and chronic kidney disease stage 3, who presents for follow-up of heart failure and atrial arrhythmias.  I last saw him in August, at which time he was feeling well.  He was needing his as needed furosemide most days to help control his leg edema.  Only medication change made was dose increase of apixaban to 5 mg twice daily based on weight and creatinine.  Today, Anthony Mosley reports that he is feeling quite well.  He has minimal leg swelling for which she has not needed to take any furosemide in the last month.  This typically resolves by the next morning with leg elevation.  He denies chest pain, shortness of breath, palpitations, and lightheadedness.  He notes some mild urinary retention several days ago, which resolved spontaneously.  --------------------------------------------------------------------------------------------------  Past Medical History:  Diagnosis Date   Actinic keratosis 02/04/2023   L post neck   Aortic atherosclerosis (HCC)    Aortic root dilatation ([place])    a.) TTE 05/29/2017 --> measured 28 mm. 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 seen on CT.   CAD (coronary artery disease)    a.  Elective R/LHC 06/13/17 showed mild-moderate nonobstructive RCA CAD with mild LAD myocardial bridging but no significant obstructive CAD; there was mildly elevated L heart  filling pressure, moderately elevated R heart filling pressure, and mild pulm HTN.   Chronic anticoagulation    Apixaban   Chronic knee pain    CKD (chronic kidney disease), stage III (HCC)    Cough    DJD (degenerative joint disease)    Erectile dysfunction    Former tobacco use    GERD (gastroesophageal reflux disease)    occ-tums prn   Hemorrhoids    HFrEF (heart failure with reduced ejection fraction) (HCC)    Hyperbilirubinemia    Hyperlipidemia    Mild aortic insufficiency    Mild pulmonary hypertension (HCC)    Moderate mitral regurgitation    NICM (nonischemic cardiomyopathy) ([place])    a. EF 40-45% by echo 05/2017, improved to 45-50% by echo 10/2020.   PAC (premature atrial contraction)    Paroxysmal A-fib (HCC)    Pleural effusion, left 11/2018   Pneumonia    Prolonged QT syndrome    RBBB    Rosacea    Ruptured spleen    Skin cancer    Left wrist - tx by Dr Koleen Nimrod in the past.   Squamous cell carcinoma of skin 11/27/2018   L inf mandible    Thoracic aortic aneurysm ([place])    a.) TTE 05/29/2017 --> 4.2 cm . b.) CT 12/2017 --> measured 4.4 cm, imaged incompletely at 4.3cm in 10/2018 CT. c.) CTA 07/27/2021 --> measured 4.5 cm.   Varicocele    Wears dentures    partial upper   Wheezing    Past Surgical History:  Procedure Laterality Date   ABDOMINAL SURGERY  due to ruptured spleen   APPENDECTOMY     Dr Emilio Math   CARDIAC CATHETERIZATION Left 07/20/2004   Procedure: CARDIAC CATHETERIZATION; Location: [facility]; Surgeon: Eustace Quail, MD   CARDIAC CATHETERIZATION Left 01/29/2001   Procedure: CARDIAC CATHETERIZATION; Location: [facility]; Surgeon: Neoma Laming, MD   CATARACT EXTRACTION W/PHACO Right 10/24/2015   Procedure: CATARACT EXTRACTION PHACO AND INTRAOCULAR LENS PLACEMENT ([place]);  Surgeon: [name], MD;  Location: [facility];  Service: Ophthalmology;  Laterality: Right;  [place] 00:52 AP% 23.5 CDE 12.35 fluid pack lot # [id number]   CATARACT EXTRACTION W/PHACO Left  11/14/2015   Procedure: CATARACT EXTRACTION PHACO AND INTRAOCULAR LENS PLACEMENT (I[facility]);  Surgeon: [name], MD;  Location: [facility];  Service: Ophthalmology;  Laterality: Left;  [place] 00:55 AP% 19.1 CDE 10.66 fluid pack lot # [id number]   COLONOSCOPY  1987   COLONOSCOPY WITH PROPOFOL N/A 08/22/2017   Procedure: COLONOSCOPY WITH PROPOFOL;  Surgeon: [name], MD;  Location: [facility];  Service: Gastroenterology;  Laterality: N/A;   HERNIA REPAIR     umbilical/ Dr Archie Patten HERNIA REPAIR Right 02/04/2019   Procedure: HERNIA REPAIR INGUINAL WITH MESH;  Surgeon: Olean Ree, MD;  Location: [facility];  Service: General;  Laterality: Right;   IR ANGIOGRAM VISCERAL SELECTIVE  10/29/2018   IR EMBO ART  VEN HEMORR LYMPH EXTRAV  INC GUIDE ROADMAPPING  10/29/2018   IR THORACENTESIS ASP PLEURAL SPACE W/IMG GUIDE  11/23/2018   IR US GUIDE VASC ACCESS RIGHT  10/29/2018   KNEE ARTHROPLASTY Right 09/19/2021   Procedure: COMPUTER ASSISTED TOTAL KNEE ARTHROPLASTY;  Surgeon: Dereck Leep, MD;  Location: [facility];  Service: Orthopedics;  Laterality: Right;   KNEE ARTHROSCOPY Bilateral    POLYPECTOMY  08/22/2017   Procedure: POLYPECTOMY;  Surgeon: [name], MD;  Location: [facility];  Service: Gastroenterology;;   RIGHT/LEFT HEART CATH AND CORONARY ANGIOGRAPHY N/A 06/13/2017   Procedure: Right/Left Heart Cath and Coronary Angiography;  Surgeon: [name], MD;  Location: [facility];  Service: Cardiovascular;  Laterality: N/A;   TONSILLECTOMY      Current Meds  Medication Sig   ACCU-CHEK GUIDE test strip Check blood sugar 1 x daily   apixaban (ELIQUIS) 5 MG TABS tablet Take 1 tablet (5 mg total) by mouth 2 (two) times daily.   atorvastatin (LIPITOR) 80 MG tablet TAKE 1 TABLET EVERY DAY   B COMPLEX VITAMINS PO Take 1 tablet by mouth daily.    calcium carbonate (TUMS - DOSED IN MG ELEMENTAL CALCIUM) 500 MG chewable tablet Chew 1 tablet by mouth as needed  for indigestion or heartburn.   Carboxymethylcellul-Glycerin (LUBRICATING EYE DROPS OP) Apply 1 drop to eye as needed (dry eyes).    celecoxib (CELEBREX) 200 MG capsule Take 1 capsule (200 mg total) by mouth 2 (two) times daily.   cholecalciferol (VITAMIN D3) 25 MCG (1000 UNIT) tablet Take 1,000 Units by mouth daily.   COLLAGEN PO Take 1 Scoop by mouth daily.   diclofenac sodium (VOLTAREN) 1 % GEL Apply 4 g topically 4 (four) times daily. To the knee if needed   fluticasone (FLONASE) 50 MCG/ACT nasal spray Place 1 spray into both nostrils daily as needed (congestion).   furosemide (LASIX) 20 MG tablet TAKE 1 TABLET EVERY DAY AS NEEDED   Guaifenesin 1200 MG TB12 Take 1,200 mg by mouth at bedtime.   metroNIDAZOLE (METROGEL) 0.75 % gel Apply to nose and cheeks every night for rosacea.   Potassium 99 MG TABS Take  99 mg by mouth daily. Leg cramps   Tetrahydroz-Dextran-PEG-Povid (EYE DROPS ADVANCED RELIEF OP) Apply 1 drop to eye as needed.   triamcinolone cream (KENALOG) 0.1 % Apply 1 Application topically 2 (two) times daily as needed.    Allergies: Macrolides and ketolides, Norvasc [amlodipine], Avodart [dutasteride], and Tape  Social History   Tobacco Use   Smoking status: Former    Packs/day: 1.50    Years: 20.00    Total pack years: 30.00    Types: Cigarettes    Quit date: 27    Years since quitting: 37.1   Smokeless tobacco: Former    Types: Chew    Quit date: 2000   Tobacco comments:    smoking cessation materials not required  Scientific laboratory technician Use: Never used  Substance Use Topics   Alcohol use: No    Alcohol/week: 0.0 standard drinks of alcohol   Drug use: No    Family History  Problem Relation Age of Onset   Alzheimer's disease Mother    Pulmonary embolism Father    Diabetes Father    Heart attack Father 4   Diabetes Brother    Heart disease Brother        CABG   Heart disease Brother        CABG   Diabetes Brother    Diabetes Brother    Healthy Brother      Review of Systems: A 12-system review of systems was performed and was negative except as noted in the HPI.  --------------------------------------------------------------------------------------------------  Physical Exam: BP 116/64 (BP Location: Left Arm, Patient Position: Sitting, Cuff Size: Normal)   Pulse (!) 52   Ht '6\' 1"'$  (1.854 m)   Wt 196 lb (88.9 kg)   SpO2 97%   BMI 25.86 kg/m   General:  NAD. Neck: No JVD or HJR. Lungs: Clear to auscultation bilaterally without wheezes or crackles. Heart: Bradycardic but regular without murmurs. Abdomen: Soft, nontender, nondistended. Extremities: No lower extremity edema.  EKG: Sinus bradycardia with left axis deviation and right bundle branch block.  QRS axis has shifted since 08/08/2022 (suspect limb lead reversal on prior tracing).  Otherwise, no significant interval change.  Lab Results  Component Value Date   WBC 6.2 05/24/2022   HGB 13.6 05/24/2022   HCT 40.9 05/24/2022   MCV 92.3 05/24/2022   PLT 191 05/24/2022    Lab Results  Component Value Date   NA 139 05/24/2022   K 4.1 05/24/2022   CL 102 05/24/2022   CO2 29 05/24/2022   BUN 22 05/24/2022   CREATININE 1.50 (H) 07/29/2022   GLUCOSE 97 05/24/2022   ALT 14 05/24/2022    Lab Results  Component Value Date   CHOL 111 05/24/2022   HDL 54 05/24/2022   LDLCALC 44 05/24/2022   TRIG 54 05/24/2022   CHOLHDL 2.1 05/24/2022    --------------------------------------------------------------------------------------------------  ASSESSMENT AND PLAN: Paroxysmal atrial fibrillation: No palpitations reported.  EKG again shows sinus bradycardia today in the absence of any rate lowering agents.  Continue anticoagulation with apixaban 5 mg twice daily.  Recheck CBC and BMP today given creatinine has been close to 1.5 in the past; if creatinine above 1.5, we will need to reduce apixaban to 2.5 mg twice daily.  Chronic HFrEF with recovered ejection fraction: Mr.  Mosley appears euvolemic and is without symptoms (NYHA class I).  Last echo in 2021 showed some improvement in his LVEF to 45-50%.  He can continue to use as needed furosemide.  He is not on a beta-blocker or ACE inhibitor/ARB due to baseline bradycardia, borderline low blood pressure at times, and chronic kidney disease.  Coronary artery disease: No angina reported in the setting of nonobstructive CAD.  Continue atorvastatin and apixaban and lieu of aspirin given PAF.  Thoracic aortic aneurysm: No symptoms reported.  Most recent CTA in 07/2022 showed stable moderate enlargement of the ascending aorta of up to 4.5 cm.  Plan for annual follow-up this summer.  Continue secondary prevention with atorvastatin.  Fluoroquinolones should be avoided if possible.  Chronic kidney disease stage 3a: Recheck BMP today to ensure appropriate apixaban dosing.  Avoid nephrotoxic agents.  Follow-up: Return to clinic in 6 months.  [name], MD 02/13/2023 8:11 AM

## 2023-02-13 NOTE — Patient Instructions (Signed)
Medication Instructions:  Your Physician recommend you continue on your current medication as directed.    *If you need a refill on your cardiac medications before your next appointment, please call your pharmacy*   Lab Work: Your provider would like for you to have following labs drawn: (CBC, CMP).   Please go to the [facility] entrance and check in at the front desk.  You do not need an appointment.  They are open from 7am-6 pm.   If you have labs (blood work) drawn today and your tests are completely normal, you will receive your results only by: [place] (if you have MyChart) OR A paper copy in the mail If you have any lab test that is abnormal or we need to change your treatment, we will call you to review the results.   Testing/Procedures: None ordered today   Follow-Up: At [facility], you and your health needs are our priority.  As part of our continuing mission to provide you with exceptional heart care, we have created designated Provider Care Teams.  These Care Teams include your primary Cardiologist (physician) and Advanced Practice Providers (APPs -  Physician Assistants and Nurse Practitioners) who all work together to provide you with the care you need, when you need it.  We recommend signing up for the patient portal called "MyChart".  Sign up information is provided on this After Visit Summary.  MyChart is used to connect with patients for Virtual Visits (Telemedicine).  Patients are able to view lab/test results, encounter notes, upcoming appointments, etc.  Non-urgent messages can be sent to your provider as well.   To learn more about what you can do with MyChart, go to [email or web address].    Your next appointment:   6 month(s)  Provider:   You may see [name], MD or one of the following Advanced Practice Providers on your designated Care Team:   Murray Hodgkins, NP Christell Faith, PA-C Cadence Kathlen Mody, PA-C Gerrie Nordmann, NP

## 2023-04-05 ENCOUNTER — Other Ambulatory Visit: Payer: Self-pay | Admitting: Internal Medicine

## 2023-04-07 NOTE — Telephone Encounter (Signed)
Refill request

## 2023-04-07 NOTE — Telephone Encounter (Signed)
Pt last saw Dr End 02/13/23, last labs 02/13/23 Creat 1.41, age 88, weight 88.9kg, based on specified criteria pt is on appropriate dosage of Eliquis  BID for afib. Will refill rx.

## 2023-05-27 ENCOUNTER — Other Ambulatory Visit: Payer: Medicare HMO

## 2023-05-27 DIAGNOSIS — Z0000 Encounter for general adult medical examination without abnormal findings: Secondary | ICD-10-CM | POA: Diagnosis not present

## 2023-05-27 DIAGNOSIS — R351 Nocturia: Secondary | ICD-10-CM | POA: Diagnosis not present

## 2023-05-27 DIAGNOSIS — R7303 Prediabetes: Secondary | ICD-10-CM | POA: Diagnosis not present

## 2023-05-27 DIAGNOSIS — E785 Hyperlipidemia, unspecified: Secondary | ICD-10-CM

## 2023-05-27 DIAGNOSIS — I480 Paroxysmal atrial fibrillation: Secondary | ICD-10-CM | POA: Diagnosis not present

## 2023-05-27 LAB — CBC WITH DIFFERENTIAL/PLATELET
Basophils Relative: 0.6 %
Eosinophils Relative: 1 %
MCHC: 33.2 g/dL (ref 32.0–36.0)
MPV: 10.7 fL (ref 7.5–12.5)
Neutro Abs: 4961 cells/uL (ref 1500–7800)
Platelets: 168 10*3/uL (ref 140–400)

## 2023-05-28 LAB — COMPLETE METABOLIC PANEL WITH GFR
AG Ratio: 2.2 (calc) (ref 1.0–2.5)
ALT: 19 U/L (ref 9–46)
AST: 23 U/L (ref 10–35)
Albumin: 4.2 g/dL (ref 3.6–5.1)
Alkaline phosphatase (APISO): 54 U/L (ref 35–144)
BUN/Creatinine Ratio: 20 (calc) (ref 6–22)
BUN: 28 mg/dL — ABNORMAL HIGH (ref 7–25)
CO2: 27 mmol/L (ref 20–32)
Calcium: 9.2 mg/dL (ref 8.6–10.3)
Chloride: 104 mmol/L (ref 98–110)
Creat: 1.43 mg/dL — ABNORMAL HIGH (ref 0.70–1.22)
Globulin: 1.9 g/dL (calc) (ref 1.9–3.7)
Glucose, Bld: 94 mg/dL (ref 65–99)
Potassium: 4.2 mmol/L (ref 3.5–5.3)
Sodium: 141 mmol/L (ref 135–146)
Total Bilirubin: 1.6 mg/dL — ABNORMAL HIGH (ref 0.2–1.2)
Total Protein: 6.1 g/dL (ref 6.1–8.1)
eGFR: 48 mL/min/{1.73_m2} — ABNORMAL LOW (ref >=60)

## 2023-05-28 LAB — TSH: TSH: 3.75 mIU/L (ref 0.40–4.50)

## 2023-05-28 LAB — LIPID PANEL
Cholesterol: 101 mg/dL (ref <200)
HDL: 49 mg/dL (ref >=40)
LDL Cholesterol (Calc): 39 mg/dL (calc)
Non-HDL Cholesterol (Calc): 52 mg/dL (calc) (ref <130)
Total CHOL/HDL Ratio: 2.1 (calc) (ref <5.0)
Triglycerides: 44 mg/dL (ref <150)

## 2023-05-28 LAB — HEMOGLOBIN A1C
Hgb A1c MFr Bld: 5.9 % of total Hgb — ABNORMAL HIGH (ref <5.7)
Mean Plasma Glucose: 123 mg/dL
eAG (mmol/L): 6.8 mmol/L

## 2023-05-28 LAB — CBC WITH DIFFERENTIAL/PLATELET
Absolute Monocytes: 608 cells/uL (ref 200–950)
Basophils Absolute: 47 cells/uL (ref 0–200)
Eosinophils Absolute: 79 cells/uL (ref 15–500)
HCT: 37.9 % — ABNORMAL LOW (ref 38.5–50.0)
Hemoglobin: 12.6 g/dL — ABNORMAL LOW (ref 13.2–17.1)
Lymphs Abs: 2204 cells/uL (ref 850–3900)
MCH: 30.7 pg (ref 27.0–33.0)
MCV: 92.4 fL (ref 80.0–100.0)
Monocytes Relative: 7.7 %
Neutrophils Relative %: 62.8 %
RBC: 4.1 10*6/uL — ABNORMAL LOW (ref 4.20–5.80)
RDW: 12.8 % (ref 11.0–15.0)
Total Lymphocyte: 27.9 %
WBC: 7.9 10*3/uL (ref 3.8–10.8)

## 2023-05-28 LAB — PSA: PSA: 0.79 ng/mL (ref <=4.00)

## 2023-06-03 ENCOUNTER — Encounter: Payer: Self-pay | Admitting: Family Medicine

## 2023-06-03 ENCOUNTER — Ambulatory Visit (INDEPENDENT_AMBULATORY_CARE_PROVIDER_SITE_OTHER): Payer: Medicare HMO | Admitting: Family Medicine

## 2023-06-03 VITALS — BP 110/70 | HR 51 | Ht 73.0 in | Wt 189.0 lb

## 2023-06-03 DIAGNOSIS — I712 Thoracic aortic aneurysm, without rupture, unspecified: Secondary | ICD-10-CM | POA: Diagnosis not present

## 2023-06-03 DIAGNOSIS — R7303 Prediabetes: Secondary | ICD-10-CM

## 2023-06-03 DIAGNOSIS — N1831 Chronic kidney disease, stage 3a: Secondary | ICD-10-CM

## 2023-06-03 DIAGNOSIS — I480 Paroxysmal atrial fibrillation: Secondary | ICD-10-CM

## 2023-06-03 DIAGNOSIS — Z0000 Encounter for general adult medical examination without abnormal findings: Secondary | ICD-10-CM

## 2023-06-03 NOTE — Assessment & Plan Note (Signed)
Well-controlled Pre-DM with A1c 5.9 stable  Plan:  1. Not on any therapy currently  2. Encourage improved lifestyle - low carb, low sugar diet, reduce portion size, continue improving regular exercise

## 2023-06-03 NOTE — Progress Notes (Unsigned)
Subjective:    Patient ID: Anthony Mosley, male    DOB: 19-Feb-1935, 88 y.o.   MRN: [id number]  Anthony Mosley is a 88 y.o. male presenting on 06/03/2023 for Annual Exam and Hand Pain (Right hand pointer finger stiff)   HPI  Here for Annual Physical and Lab Review    Osteoarthritis Bilateral   Chronic Knee Pain, bilateral lower ext S/p R knee TKR =- Dr Alinda Sierras Improved overall after completing physical therapy post op   CKD-III Stable, previous issue. Last labs reviewed Cr 1.43 stable from past 1 year 1.4 to 1.5   Pre-Diabetes Last A1c 5.9 (05/2023) He checks CBG occasional, 1 x  per week. Usually controlled < 100 fasting. He does eat some sweets, ice cream dessert   Major depression recurrent, mild Currently not on therapy Mood is stable, see below   Insomnia He will go to bed at 9pm and will get out of bed 6am, he may wake up 2-3 times overnight, sometimes to urinate etc or shoulder pain wake him up. He tried melatonin, no longer on med.   Memory Loss / Forgetful Tried Prevagen for several months without improvement. He did not notice significant improvement, then discontinued it.   Aortic Atherosclerosis Thoracic aortic aneurysm On imaging On anticoagulation   HFrEF PAF Followed by Cardiology On Anticoagulation Eliquis 5mg  BID    Additional question  Right Trigger Finger     Health Maintenance:  Future Tdap if interested.  UTD Shingles vaccine PNEUMONIA vaccine  PSA 0.79 (negative)     11/25/2022    8:16 AM 08/30/2022    8:20 AM 05/24/2022    8:18 AM  Depression screen PHQ 2/9  Decreased Interest 0 0 0  Down, Depressed, Hopeless 0 0 3  PHQ - 2 Score 0 0 3  Altered sleeping 0 0 3  Tired, decreased energy 0 0 0  Change in appetite 0 0 0  Feeling bad or failure about yourself  0 0 0  Trouble concentrating 0 0 2  Moving slowly or fidgety/restless 0 0 2  Suicidal thoughts 0 0 0  PHQ-9 Score 0 0 10  Difficult doing work/chores Not  difficult at all Not difficult at all Not difficult at all      11/25/2022    8:16 AM 05/24/2022    8:19 AM 02/02/2021    1:23 PM  GAD 7 : Generalized Anxiety Score  Nervous, Anxious, on Edge 0 0 0  Control/stop worrying 0 0 0  Worry too much - different things 0 0 0  Trouble relaxing 0 0 0  Restless 0 0 0  Easily annoyed or irritable 0 2 2  Afraid - awful might happen 0 0 0  Total GAD 7 Score 0 2 2  Anxiety Difficulty Not difficult at all Not difficult at all Not difficult at all      Past Medical History:  Diagnosis Date   Actinic keratosis 02/04/2023   L post neck   Aortic atherosclerosis (HCC)    Aortic root dilatation (HCC)    a.) TTE 05/29/2017 --> measured 28 mm. b.)TTE 10/25/2020 --> measured 40 mm.; 45 mm by CTA chest, most recently 8/22   Arthritis    B12 deficiency    Bilateral lower extremity edema    BPH (benign prostatic hyperplasia)    Bulla of lung (HCC)    a. seen on CT.   CAD (coronary artery disease)    a.  Elective R/LHC 06/13/17 showed mild-moderate  nonobstructive RCA CAD with mild LAD myocardial bridging but no significant obstructive CAD; there was mildly elevated L heart filling pressure, moderately elevated R heart filling pressure, and mild pulm HTN.   Chronic anticoagulation    Apixaban   Chronic knee pain    CKD (chronic kidney disease), stage III (HCC)    Cough    DJD (degenerative joint disease)    Erectile dysfunction    Former tobacco use    GERD (gastroesophageal reflux disease)    occ-tums prn   Hemorrhoids    HFrEF (heart failure with reduced ejection fraction) (HCC)    Hyperbilirubinemia    Hyperlipidemia    Mild aortic insufficiency    Mild pulmonary hypertension (HCC)    Moderate mitral regurgitation    NICM (nonischemic cardiomyopathy) (HCC)    a. EF 40-45% by echo 05/2017, improved to 45-50% by echo 10/2020.   PAC (premature atrial contraction)    Paroxysmal A-fib (HCC)    Pleural effusion, left 11/2018   Pneumonia     Prolonged QT syndrome    RBBB    Rosacea    Ruptured spleen    Skin cancer    Left wrist - tx by Dr Orson Aloe in the past.   Squamous cell carcinoma of skin 11/27/2018   L inf mandible    Thoracic aortic aneurysm (HCC)    a.) TTE 05/29/2017 --> 4.2 cm . b.) CT 12/2017 --> measured 4.4 cm, imaged incompletely at 4.3cm in 10/2018 CT. c.) CTA 07/27/2021 --> measured 4.5 cm.   Varicocele    Wears dentures    partial upper   Wheezing    Past Surgical History:  Procedure Laterality Date   ABDOMINAL SURGERY     due to ruptured spleen   APPENDECTOMY     Dr Clydie Braun   CARDIAC CATHETERIZATION Left 07/20/2004   Procedure: CARDIAC CATHETERIZATION; Location[facility]; Surgeon: Charlies Constable, MD   CARDIAC CATHETERIZATION Left 01/29/2001   Procedure: CARDIAC CATHETERIZATION; Location: [facility]; Surgeon: Adrian Blackwater, MD   CATARACT EXTRACTION W/PHACO Right 10/24/2015   Procedure: CATARACT EXTRACTION PHACO AND INTRAOCULAR LENS PLACEMENT (IOC);  Surgeon: Galen Manila, MD;  Location: [facility];  Service: Ophthalmology;  Laterality: Right;  [place] 00:52 AP% 23.5 CDE 12.35 fluid pack lot # [id number]   CATARACT EXTRACTION W/PHACO Left 11/14/2015   Procedure: CATARACT EXTRACTION PHACO AND INTRAOCULAR LENS PLACEMENT (IOC);  Surgeon: Galen Manila, MD;  Location: [facility];  Service: Ophthalmology;  Laterality: Left;  [place] 00:55 AP% 19.1 CDE 10.66 fluid pack lot # [id number]   COLONOSCOPY  1987   COLONOSCOPY WITH PROPOFOL N/A 08/22/2017   Procedure: COLONOSCOPY WITH PROPOFOL;  Surgeon: Midge Minium, MD;  Location[facility];  Service: Gastroenterology;  Laterality: N/A;   HERNIA REPAIR     umbilical/ Dr Laymond Purser HERNIA REPAIR Right 02/04/2019   Procedure: HERNIA REPAIR INGUINAL WITH MESH;  Surgeon: Henrene Dodge, MD;  Location: [facility];  Service: General;  Laterality: Right;   IR ANGIOGRAM VISCERAL SELECTIVE  10/29/2018   IR EMBO ART  VEN HEMORR LYMPH EXTRAV  INC GUIDE ROADMAPPING  10/29/2018    IR THORACENTESIS ASP PLEURAL SPACE W/IMG GUIDE  11/23/2018   IR US GUIDE VASC ACCESS RIGHT  10/29/2018   KNEE ARTHROPLASTY Right 09/19/2021   Procedure: COMPUTER ASSISTED TOTAL KNEE ARTHROPLASTY;  Surgeon: Donato Heinz, MD;  Location: [facility];  Service: Orthopedics;  Laterality: Right;   KNEE ARTHROSCOPY Bilateral    POLYPECTOMY  08/22/2017   Procedure: POLYPECTOMY;  Surgeon: Midge Minium, MD;  Location: [facility];  Service: Gastroenterology;;   RIGHT/LEFT HEART CATH AND CORONARY ANGIOGRAPHY N/A 06/13/2017   Procedure: Right/Left Heart Cath and Coronary Angiography;  Surgeon: Yvonne Kendall, MD;  Location: [facility];  Service: Cardiovascular;  Laterality: N/A;   TONSILLECTOMY     Social History   Socioeconomic History   Marital status: Married    Spouse name: Para March   Number of children: 2   Years of education: Not on file   Highest education level: 7th grade  Occupational History   Occupation: Retired  Tobacco Use   Smoking status: Former    Packs/day: 1.50    Years: 20.00    Additional pack years: 0.00    Total pack years: 30.00    Types: Cigarettes    Quit date: 1987    Years since quitting: 37.4   Smokeless tobacco: Former    Types: Chew    Quit date: 2000   Tobacco comments:    smoking cessation materials not required  Vaping Use   Vaping Use: Never used  Substance and Sexual Activity   Alcohol use: No    Alcohol/week: 0.0 standard drinks of alcohol   Drug use: No   Sexual activity: Never  Other Topics Concern   Not on file  Social History Narrative   Not on file   Social Determinants of Health   Financial Resource Strain: Low Risk  (08/30/2022)   Overall Financial Resource Strain (CARDIA)    Difficulty of Paying Living Expenses: Not hard at all  Food Insecurity: No Food Insecurity (08/30/2022)   Hunger Vital Sign    Worried About Running Out of Food in the Last Year: Never true    Ran Out of Food in the Last Year: Never true   Transportation Needs: No Transportation Needs (08/30/2022)   PRAPARE - Administrator, Civil Service (Medical): No    Lack of Transportation (Non-Medical): No  Physical Activity: Sufficiently Active (08/30/2022)   Exercise Vital Sign    Days of Exercise per Week: 7 days    Minutes of Exercise per Session: 30 min  Recent Concern: Physical Activity - Insufficiently Active (08/30/2022)   Exercise Vital Sign    Days of Exercise per Week: 7 days    Minutes of Exercise per Session: 20 min  Stress: No Stress Concern Present (08/30/2022)   [employer] of Occupational Health - Occupational Stress Questionnaire    Feeling of Stress : Not at all  Social Connections: Moderately Isolated (08/30/2022)   Social Connection and Isolation Panel [NHANES]    Frequency of Communication with Friends and Family: More than three times a week    Frequency of Social Gatherings with Friends and Family: More than three times a week    Attends Religious Services: More than 4 times per year    Active Member of [employer] or Organizations: No    Attends Banker Meetings: Never    Marital Status: Widowed  Intimate Partner Violence: Not At Risk (08/30/2022)   Humiliation, Afraid, Rape, and Kick questionnaire    Fear of Current or Ex-Partner: No    Emotionally Abused: No    Physically Abused: No    Sexually Abused: No   Family History  Problem Relation Age of Onset   Alzheimer's disease Mother    Pulmonary embolism Father    Diabetes Father    Heart attack Father 20   Diabetes Brother    Heart disease Brother  CABG   Heart disease Brother        CABG   Diabetes Brother    Diabetes Brother    Healthy Brother    Current Outpatient Medications on File Prior to Visit  Medication Sig   ACCU-CHEK GUIDE test strip Check blood sugar 1 x daily   apixaban (ELIQUIS) 5 MG TABS tablet TAKE 1 TABLET TWICE DAILY   atorvastatin (LIPITOR) 80 MG tablet TAKE 1 TABLET EVERY DAY   B COMPLEX  VITAMINS PO Take 1 tablet by mouth daily.    calcium carbonate (TUMS - DOSED IN MG ELEMENTAL CALCIUM) 500 MG chewable tablet Chew 1 tablet by mouth as needed for indigestion or heartburn.   Carboxymethylcellul-Glycerin (LUBRICATING EYE DROPS OP) Apply 1 drop to eye as needed (dry eyes).    celecoxib (CELEBREX) 200 MG capsule Take 1 capsule (200 mg total) by mouth 2 (two) times daily.   cholecalciferol (VITAMIN D3) 25 MCG (1000 UNIT) tablet Take 1,000 Units by mouth daily.   COLLAGEN PO Take 1 Scoop by mouth daily.   diclofenac sodium (VOLTAREN) 1 % GEL Apply 4 g topically 4 (four) times daily. To the knee if needed   fluticasone (FLONASE) 50 MCG/ACT nasal spray Place 1 spray into both nostrils daily as needed (congestion).   furosemide (LASIX) 20 MG tablet TAKE 1 TABLET EVERY DAY AS NEEDED   Guaifenesin 1200 MG TB12 Take 1,200 mg by mouth at bedtime.   metroNIDAZOLE (METROGEL) 0.75 % gel Apply to nose and cheeks every night for rosacea.   Potassium 99 MG TABS Take 99 mg by mouth daily. Leg cramps   Tetrahydroz-Dextran-PEG-Povid (EYE DROPS ADVANCED RELIEF OP) Apply 1 drop to eye as needed.   No current facility-administered medications on file prior to visit.    Review of Systems  Constitutional:  Negative for activity change, appetite change, chills, diaphoresis, fatigue and fever.  HENT:  Negative for congestion and hearing loss.   Eyes:  Negative for visual disturbance.  Respiratory:  Negative for cough, chest tightness, shortness of breath and wheezing.   Cardiovascular:  Negative for chest pain, palpitations and leg swelling.  Gastrointestinal:  Negative for abdominal pain, constipation, diarrhea, nausea and vomiting.  Genitourinary:  Negative for dysuria, frequency and hematuria.  Musculoskeletal:  Negative for arthralgias and neck pain.  Skin:  Negative for rash.  Neurological:  Negative for dizziness, weakness, light-headedness, numbness and headaches.  Hematological:  Negative for  adenopathy.  Psychiatric/Behavioral:  Negative for behavioral problems, dysphoric mood and sleep disturbance.    Per HPI unless specifically indicated above      Objective:    BP 110/70   Pulse (!) 51   Ht 6\' 1"  (1.854 m)   Wt 189 lb (85.7 kg)   SpO2 98%   BMI 24.94 kg/m   Wt Readings from Last 3 Encounters:  06/03/23 189 lb (85.7 kg)  02/13/23 196 lb (88.9 kg)  11/25/22 194 lb (88 kg)    Physical Exam Vitals and nursing note reviewed.  Constitutional:      General: He is not in acute distress.    Appearance: He is well-developed. He is not diaphoretic.     Comments: Well-appearing, comfortable, cooperative  HENT:     Head: Normocephalic and atraumatic.  Eyes:     General:        Right eye: No discharge.        Left eye: No discharge.     Conjunctiva/sclera: Conjunctivae normal.     Pupils: Pupils  are equal, round, and reactive to light.  Neck:     Thyroid: No thyromegaly.  Cardiovascular:     Rate and Rhythm: Normal rate and regular rhythm.     Pulses: Normal pulses.     Heart sounds: Normal heart sounds. No murmur heard. Pulmonary:     Effort: Pulmonary effort is normal. No respiratory distress.     Breath sounds: Normal breath sounds. No wheezing or rales.  Abdominal:     General: Bowel sounds are normal. There is no distension.     Palpations: Abdomen is soft. There is no mass.     Tenderness: There is no abdominal tenderness.  Musculoskeletal:        General: No tenderness. Normal range of motion.     Cervical back: Normal range of motion and neck supple.     Right lower leg: No edema.     Left lower leg: No edema.     Comments: Upper / Lower Extremities: - Normal muscle tone, strength bilateral upper extremities 5/5, lower extremities 5/5  Lymphadenopathy:     Cervical: No cervical adenopathy.  Skin:    General: Skin is warm and dry.     Findings: Bruising (Right great toe area of bruising under nail with thickening of nail.) present. No erythema or  rash.  Neurological:     Mental Status: He is alert and oriented to person, place, and time.     Comments: Distal sensation intact to light touch all extremities  Psychiatric:        Mood and Affect: Mood normal.        Behavior: Behavior normal.        Thought Content: Thought content normal.     Comments: Well groomed, good eye contact, normal speech and thoughts       Results for orders placed or performed in visit on 05/27/23  TSH  Result Value Ref Range   TSH 3.75 0.40 - 4.50 mIU/L  PSA  Result Value Ref Range   PSA 0.79 < OR = 4.00 ng/mL  Hemoglobin A1c  Result Value Ref Range   Hgb A1c MFr Bld 5.9 (H) <5.7 % of total Hgb   Mean Plasma Glucose 123 mg/dL   eAG (mmol/L) 6.8 mmol/L  Lipid panel  Result Value Ref Range   Cholesterol 101 <200 mg/dL   HDL 49 > OR = 40 mg/dL   Triglycerides 44 <161 mg/dL   LDL Cholesterol (Calc) 39 mg/dL (calc)   Total CHOL/HDL Ratio 2.1 <5.0 (calc)   Non-HDL Cholesterol (Calc) 52 <096 mg/dL (calc)  CBC with Differential/Platelet  Result Value Ref Range   WBC 7.9 3.8 - 10.8 Thousand/uL   RBC 4.10 (L) 4.20 - 5.80 Million/uL   Hemoglobin 12.6 (L) 13.2 - 17.1 g/dL   HCT 04.5 (L) 40.9 - 81.1 %   MCV 92.4 80.0 - 100.0 fL   MCH 30.7 27.0 - 33.0 pg   MCHC 33.2 32.0 - 36.0 g/dL   RDW 91.4 78.2 - 95.6 %   Platelets 168 140 - 400 Thousand/uL   MPV 10.7 7.5 - 12.5 fL   Neutro Abs 4,961 1,500 - 7,800 cells/uL   Lymphs Abs 2,204 850 - 3,900 cells/uL   Absolute Monocytes 608 200 - 950 cells/uL   Eosinophils Absolute 79 15 - 500 cells/uL   Basophils Absolute 47 0 - 200 cells/uL   Neutrophils Relative % 62.8 %   Total Lymphocyte 27.9 %   Monocytes Relative 7.7 %   Eosinophils  Relative 1.0 %   Basophils Relative 0.6 %  COMPLETE METABOLIC PANEL WITH GFR  Result Value Ref Range   Glucose, Bld 94 65 - 99 mg/dL   BUN 28 (H) 7 - 25 mg/dL   Creat 2.13 (H) 0.86 - 1.22 mg/dL   eGFR 48 (L) > OR = 60 mL/min/1.70m2   BUN/Creatinine Ratio 20 6 - 22  (calc)   Sodium 141 135 - 146 mmol/L   Potassium 4.2 3.5 - 5.3 mmol/L   Chloride 104 98 - 110 mmol/L   CO2 27 20 - 32 mmol/L   Calcium 9.2 8.6 - 10.3 mg/dL   Total Protein 6.1 6.1 - 8.1 g/dL   Albumin 4.2 3.6 - 5.1 g/dL   Globulin 1.9 1.9 - 3.7 g/dL (calc)   AG Ratio 2.2 1.0 - 2.5 (calc)   Total Bilirubin 1.6 (H) 0.2 - 1.2 mg/dL   Alkaline phosphatase (APISO) 54 35 - 144 U/L   AST 23 10 - 35 U/L   ALT 19 9 - 46 U/L      Assessment & Plan:   Problem List Items Addressed This Visit   None   Updated Health Maintenance information Reviewed recent lab results with patient Encouraged improvement to lifestyle with diet and exercise Goal of weight loss   No orders of the defined types were placed in this encounter.     Follow up plan: No follow-ups on file.  Saralyn Pilar, DO [facility] 06/03/2023, 8:11 AM

## 2023-06-03 NOTE — Patient Instructions (Addendum)
Thank you for coming to the office today.  Tetanus shot in the future if you get a cut/scrape or deeper wound.  Stable kidney function.  Anemia mild, hemoglobin down to 12.6 range, okay to increase dietary iron.  Please call Dr Ernest Pine and ask them about who they recommend for the Trigger Finger Surgery.  Let me know which Dr and we can refer.  Keep on current medications  PSA 0.79 negative  Recent Labs    05/27/23 0822  HGBA1C 5.9*   Lipid Panel     Component Value Date/Time   CHOL 101 05/27/2023 0822   CHOL 102 02/01/2022 0830   TRIG 44 05/27/2023 0822   HDL 49 05/27/2023 0822   HDL 44 02/01/2022 0830   CHOLHDL 2.1 05/27/2023 0822   VLDL 15 04/07/2017 0823   LDLCALC 39 05/27/2023 0822   LABVLDL 12 02/01/2022 0830     Chemistry      Component Value Date/Time   NA 141 05/27/2023 0822   NA 142 02/01/2022 0830   K 4.2 05/27/2023 0822   CL 104 05/27/2023 0822   CO2 27 05/27/2023 0822   BUN 28 (H) 05/27/2023 0822   BUN 19 02/01/2022 0830   CREATININE 1.43 (H) 05/27/2023 0822      Component Value Date/Time   CALCIUM 9.2 05/27/2023 0822   ALKPHOS 62 02/13/2023 0835   AST 23 05/27/2023 0822   ALT 19 05/27/2023 0822   BILITOT 1.6 (H) 05/27/2023 0822   BILITOT 1.5 (H) 02/01/2022 0830     CBC:    Component Value Date/Time   WBC 7.9 05/27/2023 0822   HGB 12.6 (L) 05/27/2023 0822   HGB 13.3 02/01/2022 0830   HCT 37.9 (L) 05/27/2023 0822   HCT 39.5 02/01/2022 0830   PLT 168 05/27/2023 0822   PLT 203 02/01/2022 0830   MCV 92.4 05/27/2023 0822   MCV 88 02/01/2022 0830   NEUTROABS 4,961 05/27/2023 0822   NEUTROABS 3.1 10/06/2020 1033   LYMPHSABS 2,204 05/27/2023 0822   LYMPHSABS 1.6 10/06/2020 1033   MONOABS 0.9 11/10/2018 1210   EOSABS 79 05/27/2023 0822   EOSABS 0.1 10/06/2020 1033   BASOSABS 47 05/27/2023 0822   BASOSABS 0.0 10/06/2020 1033      Please schedule a Follow-up Appointment to: Return in about 6 months (around 12/03/2023) for 6 month  Follow-up PreDM A1c, CKD updates.  If you have any other questions or concerns, please feel free to call the office or send a message through MyChart. You may also schedule an earlier appointment if necessary.  Additionally, you may be receiving a survey about your experience at our office within a few days to 1 week by e-mail or mail. We value your feedback.  Saralyn Pilar, DO [facility], [place]

## 2023-06-03 NOTE — Assessment & Plan Note (Signed)
Follow w/ Vascular on imaging for aneurysm.

## 2023-06-03 NOTE — Assessment & Plan Note (Addendum)
Stable CKD Encourage continue monitor Avoid nephrotoxic meds, improve hydration

## 2023-06-03 NOTE — Assessment & Plan Note (Signed)
Followed by Cardiology On anticoagulation Eliquis

## 2023-08-14 ENCOUNTER — Other Ambulatory Visit: Payer: Self-pay | Admitting: Dermatology

## 2023-08-14 DIAGNOSIS — L719 Rosacea, unspecified: Secondary | ICD-10-CM

## 2023-08-31 ENCOUNTER — Other Ambulatory Visit: Payer: Self-pay | Admitting: Internal Medicine

## 2023-08-31 DIAGNOSIS — I480 Paroxysmal atrial fibrillation: Secondary | ICD-10-CM

## 2023-09-01 ENCOUNTER — Ambulatory Visit: Payer: Medicare HMO | Admitting: Dermatology

## 2023-09-01 ENCOUNTER — Telehealth: Payer: Self-pay

## 2023-09-01 NOTE — Telephone Encounter (Signed)
Eliquis 5mg  refill request received. Patient is 88 years old, weight-85.7kg, Crea-1.43 on 05/27/23, Diagnosis-Afib, and last seen by Dr. Okey Dupre on 02/13/23. Dose is appropriate based on dosing criteria. Will send in refill to requested pharmacy.

## 2023-09-01 NOTE — Telephone Encounter (Signed)
Called to reschedule appointment and inform patient his appointment had to be canceled. No answer. Left detailed message letting patient know appointment has been canceled and to call back to reschedule.

## 2023-09-01 NOTE — Telephone Encounter (Signed)
Please review

## 2023-09-04 ENCOUNTER — Ambulatory Visit: Payer: Medicare HMO | Admitting: Dermatology

## 2023-09-04 VITALS — BP 125/61 | HR 63

## 2023-09-04 DIAGNOSIS — L570 Actinic keratosis: Secondary | ICD-10-CM | POA: Diagnosis not present

## 2023-09-04 DIAGNOSIS — L578 Other skin changes due to chronic exposure to nonionizing radiation: Secondary | ICD-10-CM

## 2023-09-04 DIAGNOSIS — C4442 Squamous cell carcinoma of skin of scalp and neck: Secondary | ICD-10-CM | POA: Diagnosis not present

## 2023-09-04 DIAGNOSIS — D485 Neoplasm of uncertain behavior of skin: Secondary | ICD-10-CM

## 2023-09-04 DIAGNOSIS — L719 Rosacea, unspecified: Secondary | ICD-10-CM | POA: Diagnosis not present

## 2023-09-04 DIAGNOSIS — L821 Other seborrheic keratosis: Secondary | ICD-10-CM | POA: Diagnosis not present

## 2023-09-04 DIAGNOSIS — L814 Other melanin hyperpigmentation: Secondary | ICD-10-CM

## 2023-09-04 DIAGNOSIS — Z85828 Personal history of other malignant neoplasm of skin: Secondary | ICD-10-CM

## 2023-09-04 DIAGNOSIS — W908XXA Exposure to other nonionizing radiation, initial encounter: Secondary | ICD-10-CM

## 2023-09-04 MED ORDER — DOXYCYCLINE HYCLATE 20 MG PO TABS: 20.0000 mg | ORAL_TABLET | Freq: Two times a day (BID) | ORAL | 1 refills | Status: DC

## 2023-09-04 NOTE — Patient Instructions (Addendum)
Cryotherapy Aftercare  Wash gently with soap and water everyday.   Apply Vaseline and Band-Aid daily until healed.   Wound Care Instructions  Cleanse wound gently with soap and water once a day then pat dry with clean gauze. Apply a thin coat of Petrolatum (petroleum jelly, "Vaseline") over the wound (unless you have an allergy to this). We recommend that you use a new, sterile tube of Vaseline. Do not pick or remove scabs. Do not remove the yellow or white "healing tissue" from the base of the wound.  Cover the wound with fresh, clean, nonstick gauze and secure with paper tape. You may use Band-Aids in place of gauze and tape if the wound is small enough, but would recommend trimming much of the tape off as there is often too much. Sometimes Band-Aids can irritate the skin.  You should call the office for your biopsy report after 1 week if you have not already been contacted.  If you experience any problems, such as abnormal amounts of bleeding, swelling, significant bruising, significant pain, or evidence of infection, please call the office immediately.  FOR ADULT SURGERY PATIENTS: If you need something for pain relief you may take 1 extra strength Tylenol (acetaminophen) AND 2 Ibuprofen (200mg  each) together every 4 hours as needed for pain. (do not take these if you are allergic to them or if you have a reason you should not take them.) Typically, you may only need pain medication for 1 to 3 days.     Rosacea  What is rosacea? Rosacea (say: ro-zay-sha) is a common skin disease that usually begins as a trend of flushing or blushing easily.  As rosacea progresses, a persistent redness in the center of the face will develop and may gradually spread beyond the nose and cheeks to the forehead and chin.  In some cases, the ears, chest, and back could be affected.  Rosacea may appear as tiny blood vessels or small red bumps that occur in crops.  Frequently they can contain pus, and are called  "pustules".  If the bumps do not contain pus, they are referred to as "papules".  Rarely, in prolonged, untreated cases of rosacea, the oil glands of the nose and cheeks may become permanently enlarged.  This is called rhinophyma, and is seen more frequently in men.  Signs and Risks In its beginning stages, rosacea tends to come and go, which makes it difficult to recognize.  It can start as intermittent flushing of the face.  Eventually, blood vessels may become permanently visible.  Pustules and papules can appear, but can be mistaken for adult acne.  People of all races, ages, genders and ethnic groups are at risk of developing rosacea.  However, it is more common in women (especially around menopause) and adults with fair skin between the ages of 45 and 70.  Treatment Dermatologists typically recommend a combination of treatments to effectively manage rosacea.  Treatment can improve symptoms and may stop the progression of the rosacea.  Treatment may involve both topical and oral medications.  The tetracycline antibiotics are often used for their anti-inflammatory effect; however, because of the possibility of developing antibiotic resistance, they should not be used long term at full dose.  For dilated blood vessels the options include electrodessication (uses electric current through a small needle), laser treatment, and cosmetics to hide the redness.   With all forms of treatment, improvement is a slow process, and patients may not see any results for the first 3-4 weeks.  It is very important to avoid the sun and other triggers.  Patients must wear sunscreen daily.  Skin Care Instructions: Cleanse the skin with a mild soap such as CeraVe cleanser, Cetaphil cleanser, or Dove soap once or twice daily as needed. Moisturize with Eucerin Redness Relief Daily Perfecting Lotion (has a subtle green tint), CeraVe Moisturizing Cream, or Oil of Olay Daily Moisturizer with sunscreen every morning and/or night  as recommended. Makeup should be "non-comedogenic" (won't clog pores) and be labeled "for sensitive skin". Good choices for cosmetics are: Neutrogena, Almay, and Physician's Formula.  Any product with a green tint tends to offset a red complexion. If your eyes are dry and irritated, use artificial tears 2-3 times per day and cleanse the eyelids daily with baby shampoo.  Have your eyes examined at least every 2 years.  Be sure to tell your eye doctor that you have rosacea. Alcoholic beverages tend to cause flushing of the skin, and may make rosacea worse. Always wear sunscreen, protect your skin from extreme hot and cold temperatures, and avoid spicy foods, hot drinks, and mechanical irritation such as rubbing, scrubbing, or massaging the face.  Avoid harsh skin cleansers, cleansing masks, astringents, and exfoliation. If a particular product burns or makes your face feel tight, then it is likely to flare your rosacea. If you are having difficulty finding a sunscreen that you can tolerate, you may try switching to a chemical-free sunscreen.  These are ones whose active ingredient is zinc oxide or titanium dioxide only.  They should also be fragrance free, non-comedogenic, and labeled for sensitive skin. Rosacea triggers may vary from person to person.  There are a variety of foods that have been reported to trigger rosacea.  Some patients find that keeping a diary of what they were doing when they flared helps them avoid triggers.   Due to recent changes in healthcare laws, you may see results of your pathology and/or laboratory studies on MyChart before the doctors have had a chance to review them. We understand that in some cases there may be results that are confusing or concerning to you. Please understand that not all results are received at the same time and often the doctors may need to interpret multiple results in order to provide you with the best plan of care or course of treatment. Therefore, we  ask that you please give [place] 2 business days to thoroughly review all your results before contacting the office for clarification. Should we see a critical lab result, you will be contacted sooner.   If You Need Anything After Your Visit  If you have any questions or concerns for your doctor, please call our main line at [phone] and press option 4 to reach your doctor's medical assistant. If no one answers, please leave a voicemail as directed and we will return your call as soon as possible. Messages left after 4 pm will be answered the following business day.   You may also send [place] a message via MyChart. We typically respond to MyChart messages within 1-2 business days.  For prescription refills, please ask your pharmacy to contact our office. Our fax number is [phone].  If you have an urgent issue when the clinic is closed that cannot wait until the next business day, you can page your doctor at the number below.    Please note that while we do our best to be available for urgent issues outside of office hours, we are not available 24/7.  If you have an urgent issue and are unable to reach [place], you may choose to seek medical care at your doctor's office, retail clinic, urgent care center, or emergency room.  If you have a medical emergency, please immediately call 911 or go to the emergency department.  Pager Numbers  - Dr. Gwen Pounds: [phone]  - Dr. Roseanne Reno: [phone]  - Dr. Katrinka Blazing: [phone]   In the event of inclement weather, please call our main line at [phone] for an update on the status of any delays or closures.  Dermatology Medication Tips: Please keep the boxes that topical medications come in in order to help keep track of the instructions about where and how to use these. Pharmacies typically print the medication instructions only on the boxes and not directly on the medication tubes.   If your medication is too expensive, please contact our office  at [phone] option 4 or send [place] a message through MyChart.   We are unable to tell what your co-pay for medications will be in advance as this is different depending on your insurance coverage. However, we may be able to find a substitute medication at lower cost or fill out paperwork to get insurance to cover a needed medication.   If a prior authorization is required to get your medication covered by your insurance company, please allow [place] 1-2 business days to complete this process.  Drug prices often vary depending on where the prescription is filled and some pharmacies may offer cheaper prices.  The website www.goodrx.com contains coupons for medications through different pharmacies. The prices here do not account for what the cost may be with help from insurance (it may be cheaper with your insurance), but the website can give you the price if you did not use any insurance.  - You can print the associated coupon and take it with your prescription to the pharmacy.  - You may also stop by our office during regular business hours and pick up a GoodRx coupon card.  - If you need your prescription sent electronically to a different pharmacy, notify our office through [facility] or by phone at [phone] option 4.     Si Usted Necesita Algo Despus de Su Visita  Tambin puede enviarnos un mensaje a travs de Clinical cytogeneticist. Por lo general respondemos a los mensajes de MyChart en el transcurso de 1 a 2 das hbiles.  Para renovar recetas, por favor pida a su farmacia que se ponga en contacto con nuestra oficina. Annie Sable de fax es [place] [phone].  Si tiene un asunto urgente cuando la clnica est cerrada y que no puede esperar hasta el siguiente da hbil, puede llamar/localizar a su doctor(a) al nmero que aparece a continuacin.   Por favor, tenga en cuenta que aunque hacemos todo lo posible para estar disponibles para asuntos urgentes fuera del horario de [place], no estamos  disponibles las 24 horas del da, los 7 [address]  [address] de la [place].   Si tiene un problema urgente y no puede comunicarse con nosotros, puede optar por buscar atencin mdica  en el consultorio de su doctor(a), en una clnica privada, en un centro de atencin urgente o en una sala de emergencias.  Si tiene Engineer, drilling, por favor llame inmediatamente al 911 o vaya a la sala de emergencias.  Nmeros de bper  - Dr. Gwen Pounds: [phone]  - Dra. Roseanne Reno: [phone]  - Dr. Katrinka Blazing: [phone]   En caso de inclemencias del tiempo, por favor llame a Ferne Coe  lnea principal al [phone] para una actualizacin sobre el [place] de cualquier retraso o cierre.  Consejos para la medicacin en dermatologa: Por favor, guarde las cajas en las que vienen los medicamentos de uso tpico para ayudarle a seguir las instrucciones sobre dnde y cmo usarlos. Las farmacias generalmente imprimen las instrucciones del medicamento slo en las cajas y no directamente en los tubos del [place].   Si su medicamento es muy caro, por favor, pngase en contacto con Rolm [place]ala llamando al [phone] y presione la opcin 4 o envenos un mensaje a travs de Clinical cytogeneticist.   No podemos decirle cul ser su copago por los medicamentos por adelantado ya que esto es diferente dependiendo de la cobertura de su seguro. Sin embargo, es posible que podamos encontrar un medicamento sustituto a Audiological scientist un formulario para que el seguro cubra el medicamento que se considera necesario.   Si se requiere una autorizacin previa para que su compaa de seguros [place] su medicamento, por favor permtanos de 1 a 2 das hbiles para completar [address]  Los precios de los medicamentos varan con frecuencia dependiendo del Environmental consultant de dnde se surte la receta y alguna farmacias pueden ofrecer precios ms baratos.  El sitio web www.goodrx.com tiene cupones para medicamentos de Health and safety inspector. Los precios aqu no  tienen en cuenta lo que podra costar con la ayuda del seguro (puede ser ms barato con su seguro), pero el sitio web puede darle el precio si no utiliz Tourist information centre manager.  - Puede imprimir el cupn correspondiente y llevarlo con su receta a la farmacia.  - Tambin puede pasar por nuestra oficina durante el horario de atencin regular y Education officer, museum una tarjeta de cupones de GoodRx.  - Si necesita que su receta se enve electrnicamente a una farmacia diferente, informe a nuestra oficina a travs de MyChart de [place] o por telfono llamando al [phone] y presione la opcin 4.

## 2023-09-04 NOTE — Progress Notes (Signed)
Follow-Up Visit   Subjective  Anthony Mosley is a 88 y.o. male who presents for the following: 6 month follow-up Aks. Patient has a few spots to check on the scalp, right lower neck, and right postauricular (painful). History of SCC of the left inf mandible. The patient has spots, moles and lesions to be evaluated, some may be new or changing.   The following portions of the chart were reviewed this encounter and updated as appropriate: medications, allergies, medical history  Review of Systems:  No other skin or systemic complaints except as noted in HPI or Assessment and Plan.  Objective  Well appearing patient in no apparent distress; mood and affect are within normal limits.  A focused examination was performed of the following areas: Face, scalp, arms, hands  Relevant physical exam findings are noted in the Assessment and Plan.  R postauricular hairline 0.6 pink scaly tender papule      Scalp x 4, L neck below mandible x 1, nasal tip x 2 (7) Pink scaly macules.     Assessment & Plan   Neoplasm of uncertain behavior of skin R postauricular hairline  Epidermal / dermal shaving  Lesion diameter (cm):  0.9 Informed consent: discussed and consent obtained   Patient was prepped and draped in usual sterile fashion: Area prepped with alcohol. Anesthesia: the lesion was anesthetized in a standard fashion   Anesthetic:  1% lidocaine w/ epinephrine 1-100,000 buffered w/ 8.4% NaHCO3 Instrument used: flexible razor blade   Hemostasis achieved with: pressure, aluminum chloride and electrodesiccation   Outcome: patient tolerated procedure well    Destruction of lesion  Destruction method: electrodesiccation and curettage   Informed consent: discussed and consent obtained   Curettage performed in three different directions: Yes   Electrodesiccation performed over the curetted area: Yes   Final wound size (cm):  0.9 Hemostasis achieved with:  pressure, aluminum  chloride and electrodesiccation Outcome: patient tolerated procedure well with no complications   Post-procedure details: wound care instructions given   Post-procedure details comment:  Ointment and bandage applied.  Specimen 1 - Surgical pathology Differential Diagnosis: Inflamed SK r/o SCC Check Margins: No EDC today    AK (actinic keratosis) (7) Scalp x 4, L neck below mandible x 1, nasal tip x 2  vs ISK left neck below mandible  Actinic keratoses are precancerous spots that appear secondary to cumulative UV radiation exposure/sun exposure over time. They are chronic with expected duration over 1 year. A portion of actinic keratoses will progress to squamous cell carcinoma of the skin. It is not possible to reliably predict which spots will progress to skin cancer and so treatment is recommended to prevent development of skin cancer.  Recommend daily broad spectrum sunscreen SPF 30+ to sun-exposed areas, reapply every 2 hours as needed.  Recommend staying in the shade or wearing long sleeves, sun glasses (UVA+UVB protection) and wide brim hats (4-inch brim around the entire circumference of the hat). Call for new or changing lesions.  Destruction of lesion - Scalp x 4, L neck below mandible x 1, nasal tip x 2 (7)  Destruction method: cryotherapy   Informed consent: discussed and consent obtained   Lesion destroyed using liquid nitrogen: Yes   Region frozen until ice ball extended beyond lesion: Yes   Outcome: patient tolerated procedure well with no complications   Post-procedure details: wound care instructions given   Additional details:  Prior to procedure, discussed risks of blister formation, small wound, skin dyspigmentation, or  rare scar following cryotherapy. Recommend Vaseline ointment to treated areas while healing.   ACTINIC DAMAGE - chronic, secondary to cumulative UV radiation exposure/sun exposure over time - diffuse scaly erythematous macules with underlying  dyspigmentation - Recommend daily broad spectrum sunscreen SPF 30+ to sun-exposed areas, reapply every 2 hours as needed.  - Recommend staying in the shade or wearing long sleeves, sun glasses (UVA+UVB protection) and wide brim hats (4-inch brim around the entire circumference of the hat). - Call for new or changing lesions.  SEBORRHEIC KERATOSIS - Stuck-on, waxy, tan-brown papules and/or plaques, including right lower neck  - Benign-appearing - Discussed benign etiology and prognosis. - Observe - Call for any changes  LENTIGINES Exam: scattered tan macules Due to sun exposure Treatment Plan: Benign-appearing, observe. Recommend daily broad spectrum sunscreen SPF 30+ to sun-exposed areas, reapply every 2 hours as needed.  Call for any changes  HISTORY OF SQUAMOUS CELL CARCINOMA OF THE SKIN - No evidence of recurrence today of the left inferior mandible - Recommend regular full body skin exams - Recommend daily broad spectrum sunscreen SPF 30+ to sun-exposed areas, reapply every 2 hours as needed.  - Call if any new or changing lesions are noted between office visits  ROSACEA Exam Mid face erythema with telangiectasias and inflammatory papules.  Chronic and persistent condition with duration or expected duration over one year. Condition is bothersome/symptomatic for patient. Currently flared.   Rosacea is a chronic progressive skin condition usually affecting the face of adults, causing redness and/or acne bumps. It is treatable but not curable. It sometimes affects the eyes (ocular rosacea) as well. It may respond to topical and/or systemic medication and can flare with stress, sun exposure, alcohol, exercise, topical steroids (including hydrocortisone/cortisone 10) and some foods.  Daily application of broad spectrum spf 30+ sunscreen to face is recommended to reduce flares.  Patient denies grittiness of the eyes  Treatment Plan Start doxycycline 20 MG take 2 po every day dsp  #180 1Rf Continue metronidazole 0.75% gel 1-2 times daily for rosacea.   Doxycycline should be taken with food to prevent nausea. Do not lay down for 30 minutes after taking. Be cautious with sun exposure and use good sun protection while on this medication. Pregnant women should not take this medication.    Return in about 6 months (around 03/03/2024) for UBSE, Hx SCC, Hx AKs.  ICherlyn Labella, CMA, am acting as scribe for Willeen Niece, MD .   Documentation: I have reviewed the above documentation for accuracy and completeness, and I agree with the above.  Willeen Niece, MD

## 2023-09-05 ENCOUNTER — Ambulatory Visit (INDEPENDENT_AMBULATORY_CARE_PROVIDER_SITE_OTHER): Payer: Medicare HMO

## 2023-09-05 VITALS — BP 110/68 | Ht 73.0 in | Wt 192.8 lb

## 2023-09-05 DIAGNOSIS — Z0000 Encounter for general adult medical examination without abnormal findings: Secondary | ICD-10-CM

## 2023-09-05 DIAGNOSIS — Z23 Encounter for immunization: Secondary | ICD-10-CM | POA: Diagnosis not present

## 2023-09-05 NOTE — Patient Instructions (Addendum)
Anthony Mosley , Thank you for taking time to come for your Medicare Wellness Visit. I appreciate your ongoing commitment to your health goals. Please review the following plan we discussed and let me know if I can assist you in the future.   Referrals/Orders/Follow-Ups/Clinician Recommendations: none  This is a list of the screening recommended for you and due dates:  Health Maintenance  Topic Date Due   COVID-19 Vaccine (3 - Pfizer risk series) 02/29/2020   Medicare Annual Wellness Visit  09/04/2024   Pneumonia Vaccine  Completed   Flu Shot  Completed   Zoster (Shingles) Vaccine  Completed   HPV Vaccine  Aged Out   DTaP/Tdap/Td vaccine  Discontinued    Advanced directives: (In Chart) A copy of your advanced directives are scanned into your chart should your provider ever need it.  Next Medicare Annual Wellness Visit scheduled for next year: Yes   09/09/24 @ 1:45 pm in person

## 2023-09-05 NOTE — Progress Notes (Signed)
Subjective:   Anthony Mosley is a 88 y.o. male who presents for Medicare Annual/Subsequent preventive examination.  Visit Complete: In person Cardiac Risk Factors include: advanced age (>45men, >22 women);dyslipidemia;male gender     Objective:    Today's Vitals   09/05/23 0803  BP: 110/68  Weight: 192 lb 12.8 oz (87.5 kg)  Height: 6\' 1"  (1.854 m)   Body mass index is 25.44 kg/m.     09/05/2023    8:13 AM 08/30/2022    8:22 AM 09/19/2021    7:08 AM 09/10/2021   10:40 AM 08/21/2021    9:04 AM 08/08/2020    3:43 PM 07/30/2019    1:25 PM  Advanced Directives  Does Patient Have a Medical Advance Directive? Yes Yes Yes Yes Yes Yes Yes  Type of Estate agent of [place];Living will Healthcare Power of [place];Living will Living will;Healthcare Power of [employer] Power of [employer] Power of [place];Living will Healthcare Power of [place];Living will Healthcare Power of [place];Living will  Does patient want to make changes to medical advance directive? No - Patient declined No - Patient declined No - Patient declined    No - Patient declined  Copy of Healthcare Power of Attorney in Chart? Yes - validated most recent copy scanned in chart (See row information) Yes - validated most recent copy scanned in chart (See row information) No - copy requested  Yes - validated most recent copy scanned in chart (See row information) Yes - validated most recent copy scanned in chart (See row information) Yes - validated most recent copy scanned in chart (See row information)    Current Medications (verified) Outpatient Encounter Medications as of 09/05/2023  Medication Sig   ACCU-CHEK GUIDE test strip Check blood sugar 1 x daily   apixaban (ELIQUIS) 5 MG TABS tablet TAKE 1 TABLET TWICE DAILY   atorvastatin (LIPITOR) 80 MG tablet TAKE 1 TABLET EVERY DAY   B COMPLEX VITAMINS PO Take 1 tablet by mouth daily.    calcium carbonate (TUMS - DOSED IN MG  ELEMENTAL CALCIUM) 500 MG chewable tablet Chew 1 tablet by mouth as needed for indigestion or heartburn.   Carboxymethylcellul-Glycerin (LUBRICATING EYE DROPS OP) Apply 1 drop to eye as needed (dry eyes).    cholecalciferol (VITAMIN D3) 25 MCG (1000 UNIT) tablet Take 1,000 Units by mouth daily.   COLLAGEN PO Take 1 Scoop by mouth daily.   diclofenac sodium (VOLTAREN) 1 % GEL Apply 4 g topically 4 (four) times daily. To the knee if needed   fluticasone (FLONASE) 50 MCG/ACT nasal spray Place 1 spray into both nostrils daily as needed (congestion).   metroNIDAZOLE (METROGEL) 0.75 % gel APPLY TO NOSE AND CHEEKS EVERY NIGHT FOR ROSACEA.   Potassium 99 MG TABS Take 99 mg by mouth daily. Leg cramps   Tetrahydroz-Dextran-PEG-Povid (EYE DROPS ADVANCED RELIEF OP) Apply 1 drop to eye as needed.   celecoxib (CELEBREX) 200 MG capsule Take 1 capsule (200 mg total) by mouth 2 (two) times daily. (Patient not taking: Reported on 09/05/2023)   doxycycline (PERIOSTAT) 20 MG tablet Take 1 tablet (20 mg total) by mouth 2 (two) times daily. (Patient not taking: Reported on 09/05/2023)   furosemide (LASIX) 20 MG tablet TAKE 1 TABLET EVERY DAY AS NEEDED (Patient not taking: Reported on 09/05/2023)   Guaifenesin 1200 MG TB12 Take 1,200 mg by mouth at bedtime. (Patient not taking: Reported on 09/05/2023)   [EXPIRED] triamcinolone cream (KENALOG) 0.1 % Apply 1 Application topically 2 (  two) times daily as needed.   [DISCONTINUED] apixaban (ELIQUIS) 5 MG TABS tablet Take 1 tablet (5 mg total) by mouth 2 (two) times daily.   [DISCONTINUED] atorvastatin (LIPITOR) 80 MG tablet TAKE 1 TABLET EVERY DAY   [DISCONTINUED] fluorouracil (EFUDEX) 5 % cream Apply to cheeks and temples twice a day x 7 days as tolerated.   [DISCONTINUED] metroNIDAZOLE (METROGEL) 0.75 % gel Apply to nose and cheeks every night for rosacea.   [DISCONTINUED] SHINGRIX injection    No facility-administered encounter medications on file as of 09/05/2023.     Allergies (verified) Macrolides and ketolides, Norvasc [amlodipine], Avodart [dutasteride], and Tape   History: Past Medical History:  Diagnosis Date   Actinic keratosis 02/04/2023   L post neck   Aortic atherosclerosis (HCC)    Aortic root dilatation (HCC)    a.) TTE 05/29/2017 --> measured 28 mm. b.)TTE 10/25/2020 --> measured 40 mm.; 45 mm by CTA chest, most recently 8/22   Arthritis    B12 deficiency    Bilateral lower extremity edema    BPH (benign prostatic hyperplasia)    Bulla of lung (HCC)    a. seen on CT.   CAD (coronary artery disease)    a.  Elective R/LHC 06/13/17 showed mild-moderate nonobstructive RCA CAD with mild LAD myocardial bridging but no significant obstructive CAD; there was mildly elevated L heart filling pressure, moderately elevated R heart filling pressure, and mild pulm HTN.   Chronic anticoagulation    Apixaban   Chronic knee pain    CKD (chronic kidney disease), stage III (HCC)    Cough    DJD (degenerative joint disease)    Erectile dysfunction    Former tobacco use    GERD (gastroesophageal reflux disease)    occ-tums prn   Hemorrhoids    HFrEF (heart failure with reduced ejection fraction) (HCC)    Hyperbilirubinemia    Hyperlipidemia    Mild aortic insufficiency    Mild pulmonary hypertension (HCC)    Moderate mitral regurgitation    NICM (nonischemic cardiomyopathy) (HCC)    a. EF 40-45% by echo 05/2017, improved to 45-50% by echo 10/2020.   PAC (premature atrial contraction)    Paroxysmal A-fib (HCC)    Pleural effusion, left 11/2018   Pneumonia    Prolonged QT syndrome    RBBB    Rosacea    Ruptured spleen    Skin cancer    Left wrist - tx by Dr Orson Aloe in the past.   Squamous cell carcinoma of skin 11/27/2018   L inf mandible    Thoracic aortic aneurysm (HCC)    a.) TTE 05/29/2017 --> 4.2 cm . b.) CT 12/2017 --> measured 4.4 cm, imaged incompletely at 4.3cm in 10/2018 CT. c.) CTA 07/27/2021 --> measured 4.5 cm.    Varicocele    Wears dentures    partial upper   Wheezing    Past Surgical History:  Procedure Laterality Date   ABDOMINAL SURGERY     due to ruptured spleen   APPENDECTOMY     Dr Clydie Braun   CARDIAC CATHETERIZATION Left 07/20/2004   Procedure: CARDIAC CATHETERIZATION; Location[facility]; Surgeon: Charlies Constable, MD   CARDIAC CATHETERIZATION Left 01/29/2001   Procedure: CARDIAC CATHETERIZATION; Location: [facility]; Surgeon: Adrian Blackwater, MD   CATARACT EXTRACTION W/PHACO Right 10/24/2015   Procedure: CATARACT EXTRACTION PHACO AND INTRAOCULAR LENS PLACEMENT (IOC);  Surgeon: Galen Manila, MD;  Location: [facility];  Service: Ophthalmology;  Laterality: Right;  [place] 00:52 AP% 23.5 CDE 12.35 fluid  pack lot # [id number]   CATARACT EXTRACTION W/PHACO Left 11/14/2015   Procedure: CATARACT EXTRACTION PHACO AND INTRAOCULAR LENS PLACEMENT (IOC);  Surgeon: Galen Manila, MD;  Location: [facility];  Service: Ophthalmology;  Laterality: Left;  [place] 00:55 AP% 19.1 CDE 10.66 fluid pack lot # [id number]   COLONOSCOPY  1987   COLONOSCOPY WITH PROPOFOL N/A 08/22/2017   Procedure: COLONOSCOPY WITH PROPOFOL;  Surgeon: Midge Minium, MD;  Location: [facility];  Service: Gastroenterology;  Laterality: N/A;   HERNIA REPAIR     umbilical/ Dr Laymond Purser HERNIA REPAIR Right 02/04/2019   Procedure: HERNIA REPAIR INGUINAL WITH MESH;  Surgeon: Henrene Dodge, MD;  Location: [facility];  Service: General;  Laterality: Right;   IR ANGIOGRAM VISCERAL SELECTIVE  10/29/2018   IR EMBO ART  VEN HEMORR LYMPH EXTRAV  INC GUIDE ROADMAPPING  10/29/2018   IR THORACENTESIS ASP PLEURAL SPACE W/IMG GUIDE  11/23/2018   IR US GUIDE VASC ACCESS RIGHT  10/29/2018   KNEE ARTHROPLASTY Right 09/19/2021   Procedure: COMPUTER ASSISTED TOTAL KNEE ARTHROPLASTY;  Surgeon: Donato Heinz, MD;  Location: [facility];  Service: Orthopedics;  Laterality: Right;   KNEE ARTHROSCOPY Bilateral    POLYPECTOMY  08/22/2017   Procedure: POLYPECTOMY;   Surgeon: Midge Minium, MD;  Location: [facility];  Service: Gastroenterology;;   RIGHT/LEFT HEART CATH AND CORONARY ANGIOGRAPHY N/A 06/13/2017   Procedure: Right/Left Heart Cath and Coronary Angiography;  Surgeon: Yvonne Kendall, MD;  Location: [facility];  Service: Cardiovascular;  Laterality: N/A;   TONSILLECTOMY     Family History  Problem Relation Age of Onset   Alzheimer's disease Mother    Pulmonary embolism Father    Diabetes Father    Heart attack Father 62   Diabetes Brother    Heart disease Brother        CABG   Heart disease Brother        CABG   Diabetes Brother    Diabetes Brother    Healthy Brother    Social History   Socioeconomic History   Marital status: Married    Spouse name: Para March   Number of children: 2   Years of education: Not on file   Highest education level: 7th grade  Occupational History   Occupation: Retired  Tobacco Use   Smoking status: Former    Current packs/day: 0.00    Average packs/day: 1.5 packs/day for 20.0 years (30.0 ttl pk-yrs)    Types: Cigarettes    Start date: 54    Quit date: 1987    Years since quitting: 37.7   Smokeless tobacco: Former    Types: Chew    Quit date: 2000   Tobacco comments:    smoking cessation materials not required  Vaping Use   Vaping status: Never Used  Substance and Sexual Activity   Alcohol use: No    Alcohol/week: 0.0 standard drinks of alcohol   Drug use: No   Sexual activity: Never  Other Topics Concern   Not on file  Social History Narrative   Not on file   Social Determinants of Health   Financial Resource Strain: Low Risk  (09/05/2023)   Overall Financial Resource Strain (CARDIA)    Difficulty of Paying Living Expenses: Not hard at all  Food Insecurity: No Food Insecurity (09/05/2023)   Hunger Vital Sign    Worried About Running Out of Food in the Last Year: Never true    Ran Out of Food in the  Last Year: Never true  Transportation Needs: No Transportation  Needs (09/05/2023)   PRAPARE - Administrator, Civil Service (Medical): No    Lack of Transportation (Non-Medical): No  Physical Activity: Sufficiently Active (09/05/2023)   Exercise Vital Sign    Days of Exercise per Week: 3 days    Minutes of Exercise per Session: 60 min  Stress: No Stress Concern Present (09/05/2023)   [employer] of Occupational Health - Occupational Stress Questionnaire    Feeling of Stress : Not at all  Social Connections: Moderately Isolated (09/05/2023)   Social Connection and Isolation Panel [NHANES]    Frequency of Communication with Friends and Family: Three times a week    Frequency of Social Gatherings with Friends and Family: Once a week    Attends Religious Services: More than 4 times per year    Active Member of [employer] or Organizations: No    Attends Banker Meetings: Never    Marital Status: Widowed    Tobacco Counseling Counseling given: Not Answered Tobacco comments: smoking cessation materials not required   Clinical Intake:  Pre-visit preparation completed: Yes  Pain : No/denies pain     Nutritional Status: BMI 25 -29 Overweight Nutritional Risks: None Diabetes: No  How often do you need to have someone help you when you read instructions, pamphlets, or other written materials from your doctor or pharmacy?: 1 - Never  Interpreter Needed?: No  Information entered by :: Kennedy Bucker, LPN   Activities of Daily Living    09/05/2023    8:14 AM 06/03/2023    8:25 AM  In your present state of health, do you have any difficulty performing the following activities:  Hearing? 0 0  Vision? 0 1  Difficulty concentrating or making decisions? 0 1  Walking or climbing stairs? 0 1  Dressing or bathing? 0 0  Doing errands, shopping? 0 0  Preparing Food and eating ? N   Using the Toilet? N   In the past six months, have you accidently leaked urine? N   Do you have problems with loss of bowel control? N   Managing  your Medications? N   Managing your Finances? N   Housekeeping or managing your Housekeeping? N     Patient Care Team: Smitty Cords, DO as PCP - General (Family Medicine) End, Cristal Deer, MD as PCP - Cardiology (Cardiology) Tera Partridge, PA as Physician Assistant (Physician Assistant) Galen Manila, MD as Referring Physician (Ophthalmology) Willeen Niece, MD as Consulting Physician (Dermatology) Shea Evans, Raymon Mutton, PA-C as Physician Assistant (Physician Assistant) Luretha Murphy, MD as Consulting Physician (General Surgery)  Indicate any recent Medical Services you may have received from other than [facility] providers in the past year (date may be approximate).     Assessment:   This is a routine wellness examination for [place].  Hearing/Vision screen Hearing Screening - Comments:: No aids Vision Screening - Comments:: Wears glasses- Dr.Porfilio   Goals Addressed             This Visit's Progress    Cut out extra servings         Depression Screen    09/05/2023    8:11 AM 06/03/2023    8:26 AM 06/03/2023    8:25 AM 11/25/2022    8:16 AM 08/30/2022    8:20 AM 05/24/2022    8:18 AM 11/21/2021    1:16 PM  PHQ 2/9 Scores  PHQ - 2 Score 0 0 0 0  0 3 0  PHQ- 9 Score 0 2  0 0 10 0    Fall Risk    09/05/2023    8:13 AM 06/03/2023    8:25 AM 11/25/2022    8:16 AM 08/30/2022    8:23 AM 05/24/2022    8:18 AM  Fall Risk   Falls in the past year? 0 0 0 0 0  Number falls in past yr: 0  0 0 0  Injury with Fall? 0  0 0 0  Risk for fall due to : No Fall Risks  No Fall Risks No Fall Risks No Fall Risks  Follow up Falls prevention discussed;Falls evaluation completed  Falls evaluation completed Falls prevention discussed Falls evaluation completed    MEDICARE RISK AT HOME: Medicare Risk at Home Any stairs in or around the home?: Yes If so, are there any without handrails?: No Home free of loose throw rugs in walkways, pet beds, electrical cords, etc?: Yes Adequate  lighting in your home to reduce risk of falls?: Yes Life alert?: No Use of a cane, walker or w/c?: No Grab bars in the bathroom?: Yes Shower chair or bench in shower?: Yes Elevated toilet seat or a handicapped toilet?: Yes  TIMED UP AND GO:  Was the test performed?  Yes  Length of time to ambulate 10 feet: 4 sec Gait steady and fast without use of assistive device    Cognitive Function:        09/05/2023    8:15 AM 08/30/2022    8:26 AM 05/15/2018    1:00 PM 04/07/2017    9:39 AM  6CIT Screen  What Year? 4 points 0 points 0 points 0 points  What month? 0 points 0 points 3 points 0 points  What time? 0 points 0 points 0 points 0 points  Count back from 20 0 points 2 points 0 points 0 points  Months in reverse 0 points 0 points 0 points 4 points  Repeat phrase 0 points 2 points 0 points 2 points  Total Score 4 points 4 points 3 points 6 points    Immunizations Immunization History  Administered Date(s) Administered   Fluad Quad(high Dose 65+) 08/13/2019, 09/07/2020, 11/21/2021, 11/25/2022   HIB (PRP-T) 11/04/2018   Influenza, High Dose Seasonal PF 08/23/2015, 09/16/2016, 10/15/2017, 08/24/2018   Influenza, Seasonal, Injecte, Preservative Fre 08/27/2012   Influenza,inj,Quad PF,6+ Mos 08/23/2013, 09/05/2014   Meningococcal Mcv4o 11/04/2018   PFIZER(Purple Top)SARS-COV-2 Vaccination 01/11/2020, 02/01/2020   Pneumococcal Conjugate-13 11/17/2014   Pneumococcal Polysaccharide-23 12/16/2008, 11/04/2018   Zoster Recombinant(Shingrix) 07/17/2022, 09/19/2022   Zoster, Live 06/13/2009    TDAP status: Due, Education has been provided regarding the importance of this vaccine. Advised may receive this vaccine at local pharmacy or Health Dept. Aware to provide a copy of the vaccination record if obtained from local pharmacy or Health Dept. Verbalized acceptance and understanding.  Flu Vaccine status: Completed at today's visit  Pneumococcal vaccine status: Up to date  Covid-19  vaccine status: Completed vaccines  Qualifies for Shingles Vaccine? Yes   Zostavax completed Yes   Shingrix Completed?: Yes  Screening Tests Health Maintenance  Topic Date Due   COVID-19 Vaccine (3 - Pfizer risk series) 02/29/2020   INFLUENZA VACCINE  07/17/2023   Medicare Annual Wellness (AWV)  09/04/2024   Pneumonia Vaccine 47+ Years old  Completed   Zoster Vaccines- Shingrix  Completed   HPV VACCINES  Aged Out   DTaP/Tdap/Td  Discontinued    Health Maintenance  Health Maintenance  Due  Topic Date Due   COVID-19 Vaccine (3 - Pfizer risk series) 02/29/2020   INFLUENZA VACCINE  07/17/2023    Colorectal cancer screening: No longer required.   Lung Cancer Screening: (Low Dose CT Chest recommended if Age 47-80 years, 20 pack-year currently smoking OR have quit w/in 15years.) does not qualify.   Additional Screening:  Hepatitis C Screening: does not qualify; Completed no  Vision Screening: Recommended annual ophthalmology exams for early detection of glaucoma and other disorders of the eye. Is the patient up to date with their annual eye exam?  Yes  Who is the provider or what is the name of the office in which the patient attends annual eye exams? Dr.Porfilio If pt is not established with a provider, would they like to be referred to a provider to establish care? No .   Dental Screening: Recommended annual dental exams for proper oral hygiene   Community Resource Referral / Chronic Care Management: CRR required this visit?  No   CCM required this visit?  No     Plan:     I have personally reviewed and noted the following in the patient's chart:   Medical and social history Use of alcohol, tobacco or illicit drugs  Current medications and supplements including opioid prescriptions. Patient is not currently taking opioid prescriptions. Functional ability and status Nutritional status Physical activity Advanced directives List of other  physicians Hospitalizations, surgeries, and ER visits in previous 12 months Vitals Screenings to include cognitive, depression, and falls Referrals and appointments  In addition, I have reviewed and discussed with patient certain preventive protocols, quality metrics, and best practice recommendations. A written personalized care plan for preventive services as well as general preventive health recommendations were provided to patient.     Hal Hope, LPN   [id number]   After Visit Summary: my chart  Nurse Notes: none

## 2023-09-08 LAB — SURGICAL PATHOLOGY

## 2023-09-09 ENCOUNTER — Telehealth: Payer: Self-pay

## 2023-09-09 NOTE — Telephone Encounter (Signed)
-----   Message from Willeen Niece sent at 09/09/2023  8:29 AM EDT ----- Skin , right postauricular hairline WELL DIFFERENTIATED SQUAMOUS CELL CARCINOMA   SCC skin cancer- already treated with EDC at time of biopsy   - please call patient

## 2023-09-09 NOTE — Telephone Encounter (Signed)
Advised pt of bx results/sh ?

## 2023-09-25 DIAGNOSIS — M65321 Trigger finger, right index finger: Secondary | ICD-10-CM | POA: Diagnosis not present

## 2023-09-25 DIAGNOSIS — Z96651 Presence of right artificial knee joint: Secondary | ICD-10-CM | POA: Diagnosis not present

## 2023-09-25 DIAGNOSIS — I739 Peripheral vascular disease, unspecified: Secondary | ICD-10-CM | POA: Diagnosis not present

## 2023-10-15 ENCOUNTER — Encounter: Payer: Self-pay | Admitting: Family Medicine

## 2023-10-15 ENCOUNTER — Ambulatory Visit (INDEPENDENT_AMBULATORY_CARE_PROVIDER_SITE_OTHER): Payer: Medicare HMO | Admitting: Family Medicine

## 2023-10-15 VITALS — BP 110/60 | Ht 73.0 in | Wt 192.0 lb

## 2023-10-15 DIAGNOSIS — J029 Acute pharyngitis, unspecified: Secondary | ICD-10-CM | POA: Diagnosis not present

## 2023-10-15 MED ORDER — AMOXICILLIN 500 MG PO CAPS
500.0000 mg | ORAL_CAPSULE | Freq: Two times a day (BID) | ORAL | 0 refills | Status: DC
Start: 2023-10-15 — End: 2024-02-25

## 2023-10-15 NOTE — Patient Instructions (Addendum)
Thank you for coming to the office today.  No obvious bacterial infection today  Throat and ears and lungs are clear  If not improved take Amoxicillin  Please schedule a Follow-up Appointment to: Return if symptoms worsen or fail to improve.  If you have any other questions or concerns, please feel free to call the office or send a message through MyChart. You may also schedule an earlier appointment if necessary.  Additionally, you may be receiving a survey about your experience at our office within a few days to 1 week by e-mail or mail. We value your feedback.  Saralyn Pilar, DO [facility], [place]

## 2023-10-15 NOTE — Progress Notes (Signed)
Normal appearance. He is well-developed. He is not diaphoretic.     Comments: Well-appearing, comfortable, cooperative  HENT:     Head: Normocephalic and atraumatic.     Right Ear: Tympanic membrane and ear canal normal.     Left Ear: Tympanic membrane and ear canal normal.     Nose: No congestion or rhinorrhea.     Mouth/Throat:     Mouth: Mucous membranes are moist. No oral lesions.     Pharynx: Oropharynx is clear. Uvula midline. No pharyngeal swelling, oropharyngeal exudate or posterior oropharyngeal erythema.  Eyes:     General:        Right eye: No discharge.        Left eye: No discharge.     Conjunctiva/sclera: Conjunctivae normal.  Cardiovascular:     Rate and Rhythm: Normal rate.  Pulmonary:     Effort: Pulmonary effort is normal.  Lymphadenopathy:     Cervical: No cervical adenopathy.   Skin:    General: Skin is warm and dry.     Findings: No erythema or rash.  Neurological:     Mental Status: He is alert and oriented to person, place, and time.  Psychiatric:        Mood and Affect: Mood normal.        Behavior: Behavior normal.        Thought Content: Thought content normal.     Comments: Well groomed, good eye contact, normal speech and thoughts     Results for orders placed or performed in visit on 09/04/23  Surgical pathology  Result Value Ref Range   SURGICAL PATHOLOGY      SURGICAL PATHOLOGY [facility] [address] [place], [place] [address] Telephone [phone] or [phone] Fax [phone]  REPORT OF DERMATOPATHOLOGY   Accession #: [id number] Patient Name: ZAYVIN, EBARB Visit # : [id number]  MRN: [id number] Cytotechnologist: Munoz-Bishop Md, Efraim Kaufmann, Dermatopathologist, Electronic Signature DOB/Age Aug 31, 1935 (Age: 22) Gender: M Collected Date: 09/04/2023 Received Date: 09/04/2023  FINAL DIAGNOSIS       1. Skin, right postauricular hairline :       WELL DIFFERENTIATED SQUAMOUS CELL CARCINOMA       DATE SIGNED OUT: 09/08/2023 ELECTRONIC SIGNATURE : Munoz-Bishop Md, Melissa, Dermatopathologist, Electronic Signature  MICROSCOPIC DESCRIPTION 1. There is a proliferation of atypical epithelial cells with squamous differentiation invading the dermis.  This is a well differentiated squamous cell carcinoma.  CASE COMMENTS STAINS USED IN DIAGNOSIS: H&E    CLINICAL HISTORY  SPE CIMEN(S) OBTAINED 1. Skin, Right Postauricular Hairline  SPECIMEN COMMENTS: 1. 0.6 pink scaly tender papule SPECIMEN CLINICAL INFORMATION: 1. Neoplasm of uncertain behavior of skin, inflamed SK R/O SCC    Gross Description 1. Formalin fixed specimen received:  11 X 9 X 2 MM, TOTO (4 P) (1 B) ( ew )        Report signed out from the following location(s) [facility] [address], [place] [address] CLIA #: [id number]  [facility] [address] [place], [place] [address] CLIA #: [id number]       Assessment & Plan:   Problem List Items Addressed This Visit   None Visit Diagnoses     Acute pharyngitis, unspecified etiology    -  Primary   Relevant Medications   amoxicillin (AMOXIL) 500 MG capsule       Assessment and Plan    Pharyngitis vs Sinusitis Gradual onset of sore throat  Subjective:    Patient ID: Anthony Mosley, male    DOB: November 25, 1935, 88 y.o.   MRN: [id number]  DAVRON PANCIERA is a 88 y.o. male presenting on 10/15/2023 for Sore Throat (Started Sunday night, has tried theraflu and throat spray)  Patient presents for a same day appointment.  HPI  Discussed the use of AI scribe software for clinical note transcription with the patient, who gave verbal consent to proceed.  Sore Throat  The discomfort began as a minor irritation in the back of the throat, which has gradually worsened, particularly upon waking in the morning. The patient has been using cough drops for symptomatic relief, but the condition has not improved significantly. The soreness intensifies during swallowing and is localized lower down the throat, causing occasional coughing. However, the patient denies any loss of appetite. He is tolerating food and fluids well. He denies any nausea, vomiting, fever, or chills. The patient also reports a sensation of tightness in the sinuses and forehead upon waking, which is relieved by a nasal spray. The patient has not noticed any lumps, bumps, or swollen glands in the throat area.  In addition to the throat discomfort, the patient also discussed his trigger finger condition. The patient has been advised by another physician to undergo an ultrasound-guided injection for the condition. If this treatment is not effective, the patient has been informed that a minor surgical procedure may be required. The patient has noticed that the triggering of the finger is worse after certain activities, such as using weightlifting equipment.         09/05/2023    8:11 AM 06/03/2023    8:26 AM 06/03/2023    8:25 AM  Depression screen PHQ 2/9  Decreased Interest 0 0 0  Down, Depressed, Hopeless 0 0 0  PHQ - 2 Score 0 0 0  Altered sleeping 0 0   Tired, decreased energy 0 0   Change in appetite 0 0   Feeling bad or failure about yourself  0 0   Trouble  concentrating 0 2   Moving slowly or fidgety/restless 0 0   Suicidal thoughts 0 0   PHQ-9 Score 0 2   Difficult doing work/chores Not difficult at all      Social History   Tobacco Use   Smoking status: Former    Current packs/day: 0.00    Average packs/day: 1.5 packs/day for 20.0 years (30.0 ttl pk-yrs)    Types: Cigarettes    Start date: 46    Quit date: 12    Years since quitting: 37.8   Smokeless tobacco: Former    Types: Chew    Quit date: 2000   Tobacco comments:    smoking cessation materials not required  Vaping Use   Vaping status: Never Used  Substance Use Topics   Alcohol use: No    Alcohol/week: 0.0 standard drinks of alcohol   Drug use: No    Review of Systems Per HPI unless specifically indicated above     Objective:    BP 110/60   Ht 6\' 1"  (1.854 m)   Wt 192 lb (87.1 kg)   BMI 25.33 kg/m   Wt Readings from Last 3 Encounters:  10/15/23 192 lb (87.1 kg)  09/05/23 192 lb 12.8 oz (87.5 kg)  06/03/23 189 lb (85.7 kg)    Physical Exam Vitals and nursing note reviewed.  Constitutional:      General: He is not in acute distress.    Appearance:  Normal appearance. He is well-developed. He is not diaphoretic.     Comments: Well-appearing, comfortable, cooperative  HENT:     Head: Normocephalic and atraumatic.     Right Ear: Tympanic membrane and ear canal normal.     Left Ear: Tympanic membrane and ear canal normal.     Nose: No congestion or rhinorrhea.     Mouth/Throat:     Mouth: Mucous membranes are moist. No oral lesions.     Pharynx: Oropharynx is clear. Uvula midline. No pharyngeal swelling, oropharyngeal exudate or posterior oropharyngeal erythema.  Eyes:     General:        Right eye: No discharge.        Left eye: No discharge.     Conjunctiva/sclera: Conjunctivae normal.  Cardiovascular:     Rate and Rhythm: Normal rate.  Pulmonary:     Effort: Pulmonary effort is normal.  Lymphadenopathy:     Cervical: No cervical adenopathy.   Skin:    General: Skin is warm and dry.     Findings: No erythema or rash.  Neurological:     Mental Status: He is alert and oriented to person, place, and time.  Psychiatric:        Mood and Affect: Mood normal.        Behavior: Behavior normal.        Thought Content: Thought content normal.     Comments: Well groomed, good eye contact, normal speech and thoughts     Results for orders placed or performed in visit on 09/04/23  Surgical pathology  Result Value Ref Range   SURGICAL PATHOLOGY      SURGICAL PATHOLOGY [facility] [address] [place], [place] [address] Telephone [phone] or [phone] Fax [phone]  REPORT OF DERMATOPATHOLOGY   Accession #: [id number] Patient Name: ZAYVIN, EBARB Visit # : [id number]  MRN: [id number] Cytotechnologist: Munoz-Bishop Md, Efraim Kaufmann, Dermatopathologist, Electronic Signature DOB/Age Aug 31, 1935 (Age: 22) Gender: M Collected Date: 09/04/2023 Received Date: 09/04/2023  FINAL DIAGNOSIS       1. Skin, right postauricular hairline :       WELL DIFFERENTIATED SQUAMOUS CELL CARCINOMA       DATE SIGNED OUT: 09/08/2023 ELECTRONIC SIGNATURE : Munoz-Bishop Md, Melissa, Dermatopathologist, Electronic Signature  MICROSCOPIC DESCRIPTION 1. There is a proliferation of atypical epithelial cells with squamous differentiation invading the dermis.  This is a well differentiated squamous cell carcinoma.  CASE COMMENTS STAINS USED IN DIAGNOSIS: H&E    CLINICAL HISTORY  SPE CIMEN(S) OBTAINED 1. Skin, Right Postauricular Hairline  SPECIMEN COMMENTS: 1. 0.6 pink scaly tender papule SPECIMEN CLINICAL INFORMATION: 1. Neoplasm of uncertain behavior of skin, inflamed SK R/O SCC    Gross Description 1. Formalin fixed specimen received:  11 X 9 X 2 MM, TOTO (4 P) (1 B) ( ew )        Report signed out from the following location(s) [place] [address], [place] [address] CLIA #: [id number]  [facility] [address] [place], [place] [address] CLIA #: [id number]       Assessment & Plan:   Problem List Items Addressed This Visit   None Visit Diagnoses     Acute pharyngitis, unspecified etiology    -  Primary   Relevant Medications   amoxicillin (AMOXIL) 500 MG capsule       Assessment and Plan    Pharyngitis vs Sinusitis Gradual onset of sore throat

## 2023-10-30 ENCOUNTER — Other Ambulatory Visit: Payer: Self-pay | Admitting: Dermatology

## 2023-11-12 ENCOUNTER — Other Ambulatory Visit: Payer: Self-pay | Admitting: Internal Medicine

## 2023-12-03 ENCOUNTER — Other Ambulatory Visit: Payer: Self-pay | Admitting: Family Medicine

## 2023-12-03 ENCOUNTER — Encounter: Payer: Self-pay | Admitting: Family Medicine

## 2023-12-03 ENCOUNTER — Telehealth: Payer: Self-pay

## 2023-12-03 ENCOUNTER — Ambulatory Visit (INDEPENDENT_AMBULATORY_CARE_PROVIDER_SITE_OTHER): Payer: Medicare HMO | Admitting: Family Medicine

## 2023-12-03 VITALS — BP 120/60 | HR 50 | Ht 73.0 in | Wt 199.0 lb

## 2023-12-03 DIAGNOSIS — E785 Hyperlipidemia, unspecified: Secondary | ICD-10-CM

## 2023-12-03 DIAGNOSIS — N183 Chronic kidney disease, stage 3 unspecified: Secondary | ICD-10-CM

## 2023-12-03 DIAGNOSIS — M545 Low back pain, unspecified: Secondary | ICD-10-CM

## 2023-12-03 DIAGNOSIS — R351 Nocturia: Secondary | ICD-10-CM

## 2023-12-03 DIAGNOSIS — M65321 Trigger finger, right index finger: Secondary | ICD-10-CM

## 2023-12-03 DIAGNOSIS — I712 Thoracic aortic aneurysm, without rupture, unspecified: Secondary | ICD-10-CM

## 2023-12-03 DIAGNOSIS — I480 Paroxysmal atrial fibrillation: Secondary | ICD-10-CM

## 2023-12-03 DIAGNOSIS — R7303 Prediabetes: Secondary | ICD-10-CM

## 2023-12-03 DIAGNOSIS — Z0000 Encounter for general adult medical examination without abnormal findings: Secondary | ICD-10-CM

## 2023-12-03 LAB — POCT GLYCOSYLATED HEMOGLOBIN (HGB A1C): Hemoglobin A1C: 6 % — AB (ref 4.0–5.6)

## 2023-12-03 MED ORDER — ATORVASTATIN CALCIUM 80 MG PO TABS
80.0000 mg | ORAL_TABLET | Freq: Every day | ORAL | 3 refills | Status: DC
Start: 2023-12-03 — End: 2024-03-29

## 2023-12-03 NOTE — Patient Instructions (Addendum)
Thank you for coming to the office today.  Recent Labs    05/27/23 0822  HGBA1C 5.9*   We can call with A1c result if we can get it to work.  For the Low Back Pain  May try Tylenol, muscle rub, and heating pad. Most likely a muscle strain or pain.  I recommend topical treatment with Voltaren cream / gel  START anti inflammatory topical - OTC Voltaren (generic Diclofenac) topical 2-4 times a day as needed for pain swelling of affected joint for 1-2 weeks or longer.  Refilled Cholesterol to [facility]  DUE for FASTING BLOOD WORK (no food or drink after midnight before the lab appointment, only water or coffee without cream/sugar on the morning of)  SCHEDULE "Lab Only" visit in the morning at the clinic for lab draw in 6 MONTHS   - Make sure Lab Only appointment is at about 1 week before your next appointment, so that results will be available  For Lab Results, once available within 2-3 days of blood draw, you can can log in to MyChart online to view your results and a brief explanation. Also, we can discuss results at next follow-up visit.    Please schedule a Follow-up Appointment to: Return in about 6 months (around 06/02/2024) for 6 month fasting lab > 1 week later Annual Physical.  If you have any other questions or concerns, please feel free to call the office or send a message through MyChart. You may also schedule an earlier appointment if necessary.  Additionally, you may be receiving a survey about your experience at our office within a few days to 1 week by e-mail or mail. We value your feedback.  Saralyn Pilar, DO [facility], [place]

## 2023-12-03 NOTE — Telephone Encounter (Signed)
Left message for patient to call back  

## 2023-12-03 NOTE — Telephone Encounter (Signed)
-----   Message from Saralyn Pilar sent at 12/03/2023  8:41 AM EST ----- Please notify patient of A1c result, 6.0 on fingerstick today  Slight increase from 5.9 last time. This is not a concern. No change to treatment plan. He can continue to monitor diet and check his blood sugar as he is currently  We will repeat in 6 months at his yearly  Saralyn Pilar, DO [facility] 12/03/2023, 8:41 AM

## 2023-12-03 NOTE — Progress Notes (Signed)
Subjective:    Patient ID: Anthony Mosley, male    DOB: Sep 19, 1935, 88 y.o.   MRN: [id number]  [name] is a 88 y.o. male presenting on 12/03/2023 for Hyperlipidemia and Diabetes (Mild pain in back about 1 month)   HPI  Discussed the use of AI scribe software for clinical note transcription with the patient, who gave verbal consent to proceed.     Right Low Back 1 month onset. Reports some sharp pains at times localized, not to the touch, worse with moving first in AM. No known trigger or fall or injury. Not using medicine or topicals  Right Index Trigger Finger He has followed w/ [facility] Dr Ernest Pine He has upcoming injection with Ortho Considering procedure for release in future He is concerned about this if he lives alone if there is longer recovery  Osteoarthritis Bilateral   Chronic Knee Pain, bilateral lower ext S/p R knee TKR =- Dr Ernest [facility] Improved overall after completing physical therapy post op   CKD-III Stable, previous issue. Last labs reviewed Cr 1.43 stable from past 1 year 1.4 to 1.5   Pre-Diabetes Due today for POC A1c Last A1c 5.9 (05/2023) He checks CBG occasional, 1 x  per week. Usually controlled < 100 fasting. He does eat some sweets, ice cream dessert  Hyperlipidemia Last result LDL 39, controlled on med On Atorvastatin 80mg  daily needs refill   Major depression recurrent, mild Currently not on therapy Mood is stable, see below       10/15/2023    2:33 PM 09/05/2023    8:11 AM 06/03/2023    8:26 AM  Depression screen PHQ 2/9  Decreased Interest 0 0 0  Down, Depressed, Hopeless 0 0 0  PHQ - 2 Score 0 0 0  Altered sleeping  0 0  Tired, decreased energy  0 0  Change in appetite  0 0  Feeling bad or failure about yourself   0 0  Trouble concentrating  0 2  Moving slowly or fidgety/restless  0 0  Suicidal thoughts  0 0  PHQ-9 Score  0 2  Difficult doing work/chores  Not difficult at all        10/15/2023    2:33 PM  06/03/2023    8:25 AM 11/25/2022    8:16 AM 05/24/2022    8:19 AM  GAD 7 : Generalized Anxiety Score  Nervous, Anxious, on Edge 0 0 0 0  Control/stop worrying 0 0 0 0  Worry too much - different things 0 0 0 0  Trouble relaxing 0 0 0 0  Restless 0 0 0 0  Easily annoyed or irritable 0 0 0 2  Afraid - awful might happen 0 0 0 0  Total GAD 7 Score 0 0 0 2  Anxiety Difficulty   Not difficult at all Not difficult at all    Social History   Tobacco Use   Smoking status: Former    Current packs/day: 0.00    Average packs/day: 1.5 packs/day for 20.0 years (30.0 ttl pk-yrs)    Types: Cigarettes    Start date: 66    Quit date: 5    Years since quitting: 37.9   Smokeless tobacco: Former    Types: Chew    Quit date: 2000   Tobacco comments:    smoking cessation materials not required  Vaping Use   Vaping status: Never Used  Substance Use Topics   Alcohol use: No    Alcohol/week:  0.0 standard drinks of alcohol   Drug use: No    Review of Systems Per HPI unless specifically indicated above     Objective:    BP 120/60   Pulse (!) 50   Ht 6\' 1"  (1.854 m)   Wt 199 lb (90.3 kg)   BMI 26.25 kg/m   Wt Readings from Last 3 Encounters:  12/03/23 199 lb (90.3 kg)  10/15/23 192 lb (87.1 kg)  09/05/23 192 lb 12.8 oz (87.5 kg)    Physical Exam Vitals and nursing note reviewed.  Constitutional:      General: He is not in acute distress.    Appearance: Normal appearance. He is well-developed. He is not diaphoretic.     Comments: Well-appearing, comfortable, cooperative  HENT:     Head: Normocephalic and atraumatic.  Eyes:     General:        Right eye: No discharge.        Left eye: No discharge.     Conjunctiva/sclera: Conjunctivae normal.  Cardiovascular:     Rate and Rhythm: Normal rate.  Pulmonary:     Effort: Pulmonary effort is normal.  Skin:    General: Skin is warm and dry.     Findings: No erythema or rash.  Neurological:     Mental Status: He is alert and  oriented to person, place, and time.  Psychiatric:        Mood and Affect: Mood normal.        Behavior: Behavior normal.        Thought Content: Thought content normal.     Comments: Well groomed, good eye contact, normal speech and thoughts     Results for orders placed or performed in visit on 12/03/23  POCT HgB A1C   Collection Time: 12/03/23  8:38 AM  Result Value Ref Range   Hemoglobin A1C 6.0 (A) 4.0 - 5.6 %   HbA1c POC (<> result, manual entry)     HbA1c, POC (prediabetic range)     HbA1c, POC (controlled diabetic range)        Assessment & Plan:   Problem List Items Addressed This Visit     Dyslipidemia   Relevant Medications   atorvastatin (LIPITOR) 80 MG tablet   Pre-diabetes - Primary   Relevant Orders   POCT HgB A1C (Completed)   Other Visit Diagnoses       Acute right-sided low back pain without sciatica         Trigger index finger of right hand             Right Lower Back Pain New onset, intermittent, sharp pain in the right lower back. No interventions tried yet. Likely musculoskeletal in nature. Patient prefers conservative management. -Monitor symptoms.  Trigger Finger, R Right hand finger stiffness, worse in the morning. Patient has an appointment for possible corticosteroid injection. -Continue with planned appointment.  Hyperlipidemia Previously controlled on Atorvastatin 20mg . -Refill cholesterol medication for six months.  Pre-Diabetes Patient self-monitors blood glucose levels, which are typically under 100 when fasting. Repeat A1c today 6.0, stable from prior. Remain off medication         Orders Placed This Encounter  Procedures   POCT HgB A1C    Meds ordered this encounter  Medications   atorvastatin (LIPITOR) 80 MG tablet    Sig: Take 1 tablet (80 mg total) by mouth daily.    Dispense:  90 tablet    Refill:  3    Future refills  Follow up plan: Return in about 6 months (around 06/02/2024) for 6 month fasting  lab > 1 week later Annual Physical.  Future labs ordered for 06/09/24   Saralyn Pilar, DO [facility] 12/03/2023, 8:15 AM

## 2023-12-18 ENCOUNTER — Telehealth: Payer: Self-pay | Admitting: Internal Medicine

## 2023-12-18 ENCOUNTER — Other Ambulatory Visit: Payer: Self-pay

## 2023-12-18 DIAGNOSIS — I480 Paroxysmal atrial fibrillation: Secondary | ICD-10-CM

## 2023-12-18 NOTE — Telephone Encounter (Signed)
*  STAT* If patient is at the pharmacy, call can be transferred to refill team.   1. Which medications need to be refilled? (please list name of each medication and dose if known) apixaban  (ELIQUIS ) 5 MG TABS tablet   2. Which pharmacy/location (including street and city if local pharmacy) is medication to be sent to? [facility] - [place], [place] - [address] Phone: [phone]  Fax: [phone]     3. Do they need a 30 day or 90 day supply? 90

## 2023-12-23 MED ORDER — APIXABAN 5 MG PO TABS: 5.0000 mg | ORAL_TABLET | Freq: Two times a day (BID) | ORAL | 0 refills | Status: DC

## 2023-12-23 NOTE — Telephone Encounter (Signed)
 Prescription refill request for Eliquis received. Indication: a fib Last office visit: 02/13/23 Scr: 1.43 epic 05/27/23 Age: 89 Weight: 90kg

## 2023-12-26 DIAGNOSIS — M19041 Primary osteoarthritis, right hand: Secondary | ICD-10-CM | POA: Diagnosis not present

## 2023-12-26 DIAGNOSIS — M65321 Trigger finger, right index finger: Secondary | ICD-10-CM | POA: Diagnosis not present

## 2024-01-02 ENCOUNTER — Other Ambulatory Visit: Payer: Self-pay

## 2024-01-22 ENCOUNTER — Other Ambulatory Visit: Payer: Self-pay

## 2024-01-22 ENCOUNTER — Encounter: Payer: Self-pay | Admitting: Pharmacist

## 2024-01-22 ENCOUNTER — Other Ambulatory Visit (HOSPITAL_COMMUNITY): Payer: Self-pay

## 2024-01-22 ENCOUNTER — Telehealth: Payer: Self-pay | Admitting: Internal Medicine

## 2024-01-22 DIAGNOSIS — I480 Paroxysmal atrial fibrillation: Secondary | ICD-10-CM

## 2024-01-22 MED ORDER — APIXABAN 5 MG PO TABS
5.0000 mg | ORAL_TABLET | Freq: Two times a day (BID) | ORAL | 1 refills | Status: DC
  Filled 2024-01-22 – 2024-01-23 (×2): qty 200, 100d supply, fill #0
  Filled 2024-05-18: qty 200, 100d supply, fill #1

## 2024-01-22 MED ORDER — DOXYCYCLINE HYCLATE 20 MG PO TABS
20.0000 mg | ORAL_TABLET | Freq: Two times a day (BID) | ORAL | 0 refills | Status: DC
  Filled 2024-01-22: qty 200, 100d supply, fill #0

## 2024-01-22 NOTE — Telephone Encounter (Signed)
 Prescription refill request for Eliquis  received. Indication: Afib  Last office visit: 02/13/23 (End)  Scr: 1.43 (05/27/23)  Age: 89 Weight: 90.3kg  Appropriate dose. Refill sent.

## 2024-01-22 NOTE — Telephone Encounter (Signed)
 Refill Request.

## 2024-01-22 NOTE — Progress Notes (Signed)
 Patient called for RF of doxycycline  to new pharmacy due to new insurance. Patient request 100 day supply to be sent in as that is what his insurance covers the cheapest. aw

## 2024-01-22 NOTE — Telephone Encounter (Signed)
*  STAT* If patient is at the pharmacy, call can be transferred to refill team.   1. Which medications need to be refilled? (please list name of each medication and dose if known) apixaban  (ELIQUIS ) 5 MG TABS tablet   2. Which pharmacy/location (including street and city if local pharmacy) is medication to be sent to? [place] [facility]   3. Do they need a 30 day or 90 day supply? 100 day supply

## 2024-01-23 ENCOUNTER — Other Ambulatory Visit (HOSPITAL_COMMUNITY): Payer: Self-pay

## 2024-01-23 ENCOUNTER — Other Ambulatory Visit: Payer: Self-pay

## 2024-01-29 ENCOUNTER — Ambulatory Visit: Payer: PPO | Attending: Internal Medicine | Admitting: Internal Medicine

## 2024-01-29 ENCOUNTER — Encounter: Payer: Self-pay | Admitting: Internal Medicine

## 2024-01-29 VITALS — BP 108/60 | HR 56 | Ht 73.0 in | Wt 199.2 lb

## 2024-01-29 DIAGNOSIS — I480 Paroxysmal atrial fibrillation: Secondary | ICD-10-CM

## 2024-01-29 DIAGNOSIS — I2510 Atherosclerotic heart disease of native coronary artery without angina pectoris: Secondary | ICD-10-CM

## 2024-01-29 DIAGNOSIS — I7121 Aneurysm of the ascending aorta, without rupture: Secondary | ICD-10-CM

## 2024-01-29 DIAGNOSIS — I5022 Chronic systolic (congestive) heart failure: Secondary | ICD-10-CM | POA: Diagnosis not present

## 2024-01-29 DIAGNOSIS — Z79899 Other long term (current) drug therapy: Secondary | ICD-10-CM

## 2024-01-29 DIAGNOSIS — N1831 Chronic kidney disease, stage 3a: Secondary | ICD-10-CM

## 2024-01-29 NOTE — Progress Notes (Signed)
 Cardiology Office Note:  .   Date:  01/29/2024  ID:  [name], DOB [birth year], MRN [id number] PCP: Smitty Cords, DO  [facility] Providers Cardiologist:  Yvonne Kendall, MD     History of Present Illness: .   Anthony Mosley is a 89 y.o. male with history of nonobstructive coronary artery disease, chronic HFrEF with partially recovered LVEF, MAT versus PAF, pulmonary hypertension, thoracic aortic aneurysm, mild to moderate mitral regurgitation, hyperlipidemia, and chronic kidney disease stage 3, who returns for follow-up of heart failure and atrial arrhythmias.  I last saw him a year ago, which time he was feeling well with minimal lower extremity edema.  He denied chest pain and shortness of breath.  Today, Anthony Mosley reports that he continues to feel well.  His only complaint is of a sporadic muscle cramp in the left lower chest wall.  He feels like a muscle between his ribs tightens up and protrudes, most often when sitting in his recliner.  He massages the area with resolution in the discomfort over the course of 2-3 minutes.  He denies exertional chest pain, as well as shortness of breath, palpitations, and lightheadedness.  He has stable mild lower extremity edema, which is dependent.  He has noticed more prominent varicose veins in both calves (R>L).  He bruises easily but has not had any significant bleeding.  He tries to walk regularly and is also doing some strength exercises.  ROS: See HPI  Studies Reviewed: [name]   EKG Interpretation Date/Time:  Thursday January 29 2024 08:01:03 EST Ventricular Rate:  56 PR Interval:  130 QRS Duration:  148 QT Interval:  500 QTC Calculation: 482 R Axis:   -82  Text Interpretation: Sinus bradycardia with Premature atrial complexes Left axis deviation Right bundle branch block Possible Anterior infarct , age undetermined When compared with ECG of 13-Feb-2023 Premature atrial complexes are now Present Confirmed by  Stevon Gough [phone]) on 01/29/2024 8:12:28 AM    Risk Assessment/Calculations:    CHA2DS2-VASc Score = 5   This indicates a 7.2% annual risk of stroke. The patient's score is based upon: CHF History: 1 HTN History: 1 Diabetes History: 0 Stroke History: 0 Vascular Disease History: 1 Age Score: 2 Gender Score: 0            Physical Exam:   VS:  BP 108/60   Pulse (!) 56   Ht 6\' 1"  (1.854 m)   Wt 199 lb 3.2 oz (90.4 kg)   SpO2 99%   BMI 26.28 kg/m    Wt Readings from Last 3 Encounters:  01/29/24 199 lb 3.2 oz (90.4 kg)  12/03/23 199 lb (90.3 kg)  10/15/23 192 lb (87.1 kg)    General:  NAD. Neck: No JVD or HJR. Lungs: Clear to auscultation bilaterally without wheezes or crackles. Heart: Regular rate and rhythm without murmurs, rubs, or gallops. Abdomen: Soft, nontender, nondistended. Extremities: Trace pretibial edema bilaterally with varicose veins noted.  ASSESSMENT AND PLAN: .    Paroxysmal atrial fibrillation: No symptoms to suggest recurrent a-fib noted.  EKG today again shows sinus bradycardia with PAC's.  We will continue with apixaban 5 mg BID; I will check a CBC and CMP today.  If creatinine is above 1.5, we will need to deescalate apixaban to 2.5 mg BID given age > 28.  HFmrEF: Other than trace dependent edema, Anthony Mosley appears euvolemic with stable NYHA class II symptoms.  He is not on a beta-blocker or ACEI/ARB due  to baseline bradycardia, intermittent low BP, and CKD.  We will plan to repeat an echocardiogram.  If LVEF has declined again, we will need to readdress rechallenging him with GDMT.  Continue with prn furosemide.  Thoracic aortic aneurysm: We will obtain an echo for reevaluation in order to avoid contrast at this time with CKD.  Continue atorvastatin and avoidance of flouroquinolones.  Anthony Mosley would be interested in cardiac surgery consultation if there is evidence of TAA enlargement.  Coronary artery disease and hyperlipidemia: No  angina reported in the setting of non-obstructive CAD.  Continue high intensity statin therapy and apixaban in place of ASA given PAF.  LDL at goal on last check in 05/2023.  Chronic kidney disease stage 3a: Check CMP today.  Avoid nephrotoxic agents.    Dispo: Return to clinic in 1 year, sooner if significant changes are noted on echo.  Signed, Yvonne Kendall, MD

## 2024-01-29 NOTE — Patient Instructions (Signed)
Medication Instructions:  Your physician recommends that you continue on your current medications as directed. Please refer to the Current Medication list given to you today.   *If you need a refill on your cardiac medications before your next appointment, please call your pharmacy*   Lab Work: Your provider would like for you to have following labs drawn today (CBC, CMP).     Testing/Procedures: Your physician has requested that you have an echocardiogram. Echocardiography is a painless test that uses sound waves to create images of your heart. It provides your doctor with information about the size and shape of your heart and how well your heart's chambers and valves are working.   You may receive an ultrasound enhancing agent through an IV if needed to better visualize your heart during the echo. This procedure takes approximately one hour.  There are no restrictions for this procedure.  This will take place at [address] [facility] [address]) [address], [place] [address]  Please note: We ask at that you not bring children with you during ultrasound (echo/ vascular) testing. Due to room size and safety concerns, children are not allowed in the ultrasound rooms during exams. Our front office staff cannot provide observation of children in our lobby area while testing is being conducted. An adult accompanying a patient to their appointment will only be allowed in the ultrasound room at the discretion of the ultrasound technician under special circumstances. We apologize for any inconvenience.    Follow-Up: At [facility], you and your health needs are our priority.  As part of our continuing mission to provide you with exceptional heart care, we have created designated Provider Care Teams.  These Care Teams include your primary Cardiologist (physician) and Advanced Practice Providers (APPs -  Physician Assistants and Nurse Practitioners) who all work together to provide you  with the care you need, when you need it.  We recommend signing up for the patient portal called "MyChart".  Sign up information is provided on this After Visit Summary.  MyChart is used to connect with patients for Virtual Visits (Telemedicine).  Patients are able to view lab/test results, encounter notes, upcoming appointments, etc.  Non-urgent messages can be sent to your provider as well.   To learn more about what you can do with MyChart, go to [email or web address].    Your next appointment:   1 year(s)  Provider:   You may see Yvonne Kendall, MD or one of the following Advanced Practice Providers on your designated Care Team:   Nicolasa Ducking, NP Eula Listen, PA-C Cadence Fransico Michael, PA-C Charlsie Quest, NP Carlos Levering, NP

## 2024-01-30 LAB — COMPREHENSIVE METABOLIC PANEL
ALT: 26 [IU]/L (ref 0–44)
AST: 32 [IU]/L (ref 0–40)
Albumin: 4.1 g/dL (ref 3.7–4.7)
Alkaline Phosphatase: 70 [IU]/L (ref 44–121)
BUN/Creatinine Ratio: 13 (ref 10–24)
BUN: 20 mg/dL (ref 8–27)
Bilirubin Total: 1.4 mg/dL — ABNORMAL HIGH (ref 0.0–1.2)
CO2: 25 mmol/L (ref 20–29)
Calcium: 9.1 mg/dL (ref 8.6–10.2)
Chloride: 103 mmol/L (ref 96–106)
Creatinine, Ser: 1.52 mg/dL — ABNORMAL HIGH (ref 0.76–1.27)
Globulin, Total: 2.3 g/dL (ref 1.5–4.5)
Glucose: 101 mg/dL — ABNORMAL HIGH (ref 70–99)
Potassium: 4.4 mmol/L (ref 3.5–5.2)
Sodium: 143 mmol/L (ref 134–144)
Total Protein: 6.4 g/dL (ref 6.0–8.5)
eGFR: 44 mL/min/{1.73_m2} — ABNORMAL LOW (ref >59)

## 2024-01-30 LAB — CBC
Hematocrit: 40.4 % (ref 37.5–51.0)
Hemoglobin: 13.8 g/dL (ref 13.0–17.7)
MCH: 31.6 pg (ref 26.6–33.0)
MCHC: 34.2 g/dL (ref 31.5–35.7)
MCV: 92 fL (ref 79–97)
Platelets: 171 10*3/uL (ref 150–450)
RBC: 4.37 x10E6/uL (ref 4.14–5.80)
RDW: 12.7 % (ref 11.6–15.4)
WBC: 8 10*3/uL (ref 3.4–10.8)

## 2024-02-10 ENCOUNTER — Other Ambulatory Visit: Payer: Self-pay

## 2024-02-10 ENCOUNTER — Other Ambulatory Visit: Payer: Self-pay | Admitting: Dermatology

## 2024-02-11 DIAGNOSIS — H02055 Trichiasis without entropian left lower eyelid: Secondary | ICD-10-CM | POA: Diagnosis not present

## 2024-02-11 DIAGNOSIS — Z961 Presence of intraocular lens: Secondary | ICD-10-CM | POA: Diagnosis not present

## 2024-02-11 DIAGNOSIS — H43813 Vitreous degeneration, bilateral: Secondary | ICD-10-CM | POA: Diagnosis not present

## 2024-02-11 DIAGNOSIS — D3132 Benign neoplasm of left choroid: Secondary | ICD-10-CM | POA: Diagnosis not present

## 2024-02-19 ENCOUNTER — Ambulatory Visit: Payer: PPO | Attending: Internal Medicine

## 2024-02-19 DIAGNOSIS — I5022 Chronic systolic (congestive) heart failure: Secondary | ICD-10-CM | POA: Diagnosis not present

## 2024-02-19 DIAGNOSIS — I7121 Aneurysm of the ascending aorta, without rupture: Secondary | ICD-10-CM | POA: Diagnosis not present

## 2024-02-19 LAB — ECHOCARDIOGRAM COMPLETE
AV Mean grad: 4 mmHg
AV Peak grad: 7.2 mmHg
Ao pk vel: 1.34 m/s
Area-P 1/2: 2.07 cm2
P 1/2 time: 684 ms
S' Lateral: 4.2 cm

## 2024-02-25 ENCOUNTER — Other Ambulatory Visit: Payer: Self-pay

## 2024-02-25 ENCOUNTER — Encounter: Payer: Self-pay | Admitting: Internal Medicine

## 2024-02-25 ENCOUNTER — Ambulatory Visit: Attending: Internal Medicine | Admitting: Internal Medicine

## 2024-02-25 VITALS — BP 124/70 | HR 71 | Ht 72.0 in | Wt 198.6 lb

## 2024-02-25 DIAGNOSIS — I7121 Aneurysm of the ascending aorta, without rupture: Secondary | ICD-10-CM

## 2024-02-25 DIAGNOSIS — I2510 Atherosclerotic heart disease of native coronary artery without angina pectoris: Secondary | ICD-10-CM | POA: Diagnosis not present

## 2024-02-25 DIAGNOSIS — I480 Paroxysmal atrial fibrillation: Secondary | ICD-10-CM | POA: Diagnosis not present

## 2024-02-25 DIAGNOSIS — I5022 Chronic systolic (congestive) heart failure: Secondary | ICD-10-CM

## 2024-02-25 DIAGNOSIS — Z79899 Other long term (current) drug therapy: Secondary | ICD-10-CM

## 2024-02-25 MED ORDER — LISINOPRIL 2.5 MG PO TABS
2.5000 mg | ORAL_TABLET | Freq: Every day | ORAL | 3 refills | Status: AC
  Filled 2024-02-25: qty 90, 90d supply, fill #0
  Filled 2024-05-18: qty 90, 90d supply, fill #1
  Filled 2024-08-18: qty 90, 90d supply, fill #2
  Filled 2024-11-16: qty 90, 90d supply, fill #3

## 2024-02-25 NOTE — Progress Notes (Signed)
 Cardiology Office Note:  .   Date:  02/25/2024  ID:  Anthony Mosley, DOB [birth year], MRN [id number] PCP: Smitty Cords, DO  [facility] Providers Cardiologist:  Yvonne Kendall, MD     History of Present Illness: .   Anthony Mosley is a 89 y.o. male with history of nonobstructive coronary artery disease, chronic HFrEF with partially recovered LVEF, MAT versus PAF, pulmonary hypertension, thoracic aortic aneurysm, mild to moderate mitral regurgitation, hyperlipidemia, and chronic kidney disease stage 3, who presents for follow-up of heart failure.  I last saw him a month ago, at which time he was feeling fairly well though he noted sporadic muscle cramps in his left lower chest wall.  Chronic mild lower extremity edema was stable.  We agreed to repeat an echocardiogram, which showed interval decline in LVEF 45-50% in 2021 to 35-40%.  Today, [name] continues to feel well.  He has some exertional dyspnea with strenuous activities that is unchanged from prior visits.  He also notes occasional dependent edema towards the Anthony Mosley of the day but has not needed his as needed furosemide for at least 6 months.  He denies chest pain, palpitations, and lightheadedness.  ROS: See HPI  Studies Reviewed: [name]       TTE (02/19/2024): Mildly dilated LV with normal wall thickness.  LVEF 35-40% by visual estimation, 46% by 3D volume calculation.  Global hypokinesis noted with grade 1 diastolic dysfunction.  GLS -16.9%.  Normal RV size and function.  Normal PA pressure.  Moderate left atrial enlargement.  No pericardial effusion.  Mild to moderate mitral regurgitation.  Mild tricuspid regurgitation.  Mild to moderate aortic regurgitation.  Mildly dilated ascending aorta, measuring up to 4.3 cm.  Normal CVP.  Risk Assessment/Calculations:    CHA2DS2-VASc Score = 5   This indicates a 7.2% annual risk of stroke. The patient's score is based upon: CHF History: 1 HTN History: 1 Diabetes  History: 0 Stroke History: 0 Vascular Disease History: 1 Age Score: 2 Gender Score: 0            Physical Exam:   VS:  BP 124/70 (BP Location: Left Arm, Patient Position: Sitting, Cuff Size: Normal)   Pulse 71   Ht 6' (1.829 m)   Wt 198 lb 9.6 oz (90.1 kg)   SpO2 99%   BMI 26.94 kg/m    Wt Readings from Last 3 Encounters:  02/25/24 198 lb 9.6 oz (90.1 kg)  01/29/24 199 lb 3.2 oz (90.4 kg)  12/03/23 199 lb (90.3 kg)    General:  NAD. Neck: No JVD or HJR. Lungs: Clear to auscultation bilaterally without wheezes or crackles. Heart: Regular rate and rhythm without murmurs, rubs, or gallops. Abdomen: Soft, nontender, nondistended. Extremities: No lower extremity edema.  ASSESSMENT AND PLAN: .    Chronic HFrEF due to nonischemic cardiomyopathy: [name] appears euvolemic with stable NYHA class II symptoms.  His recent echocardiogram showed reduced LVEF to 35-40%, though on my review, it appears a bit higher, probably closer to 40-45%.  Nonetheless, I think it would be appropriate to challenge [name] with GDMT given gradual decline in LVEF since 2021.  We have agreed to resume lisinopril 2.5 mg daily with close monitoring of his renal function and potassium.  I am reluctant to add a beta-blocker given history of resting bradycardia.  Will plan to repeat a BMP in about 2 weeks.  Paroxysmal atrial fibrillation: No evidence of recurrence.  Continue apixaban 5 mg twice daily  for now, though creatinine was borderline on check last month.  We will recheck a BMP in 2 weeks and need to de-escalate apixaban to 2.5 mg twice daily if creatinine is above 1.5.  Thoracic aortic aneurysm: Ascending aorta measured at 4.3 cm on recent echocardiogram (previously 4.5 cm by CTA in 07/2022).  As above, we will add lisinopril for management of cardiomyopathy, which will also provide some blood pressure control (blood pressure is normal today).  No further intervention at this time.   Fluoroquinolones should be avoided if possible.  Nonobstructive coronary artery disease: No angina reported.  Continue apixaban and lieu of aspirin as well as atorvastatin for secondary prevention.    Dispo: Return to clinic in 3 months.  Signed, Yvonne Kendall, MD

## 2024-02-25 NOTE — Patient Instructions (Signed)
 Medication Instructions:  Your physician recommends the following medication changes.  START TAKING: Lisinopril 2.5 mg by mouth daily  *If you need a refill on your cardiac medications before your next appointment, please call your pharmacy*   Lab Work: Your provider would like for you to return in 2 weeks to have the following labs drawn: (BMP).   Please go to [facility] [address] [address] [address], [place] [address] You do not need an appointment.  They are open from 8 am- 4:30 pm.  Lunch from 1:00 pm- 2:00 pm You will not need to be fasting.    Testing/Procedures: No test ordered today    Follow-Up: At [facility], you and your health needs are our priority.  As part of our continuing mission to provide you with exceptional heart care, we have created designated Provider Care Teams.  These Care Teams include your primary Cardiologist (physician) and Advanced Practice Providers (APPs -  Physician Assistants and Nurse Practitioners) who all work together to provide you with the care you need, when you need it.  We recommend signing up for the patient portal called "MyChart".  Sign up information is provided on this After Visit Summary.  MyChart is used to connect with patients for Virtual Visits (Telemedicine).  Patients are able to view lab/test results, encounter notes, upcoming appointments, etc.  Non-urgent messages can be sent to your provider as well.   To learn more about what you can do with MyChart, go to [email or web address].    Your next appointment:   3 month(s)  Provider:   You may see Yvonne Kendall, MD or one of the following Advanced Practice Providers on your designated Care Team:   Nicolasa Ducking, NP Eula Listen, PA-C Cadence Fransico Michael, PA-C Charlsie Quest, NP Carlos Levering, NP

## 2024-03-11 DIAGNOSIS — Z79899 Other long term (current) drug therapy: Secondary | ICD-10-CM | POA: Diagnosis not present

## 2024-03-12 LAB — BASIC METABOLIC PANEL WITH GFR
BUN/Creatinine Ratio: 13 (ref 10–24)
BUN: 19 mg/dL (ref 8–27)
CO2: 25 mmol/L (ref 20–29)
Calcium: 9.4 mg/dL (ref 8.6–10.2)
Chloride: 100 mmol/L (ref 96–106)
Creatinine, Ser: 1.46 mg/dL — ABNORMAL HIGH (ref 0.76–1.27)
Glucose: 105 mg/dL — ABNORMAL HIGH (ref 70–99)
Potassium: 4.7 mmol/L (ref 3.5–5.2)
Sodium: 142 mmol/L (ref 134–144)
eGFR: 47 mL/min/{1.73_m2} — ABNORMAL LOW (ref >59)

## 2024-03-22 ENCOUNTER — Ambulatory Visit: Payer: PPO | Admitting: Dermatology

## 2024-03-22 ENCOUNTER — Other Ambulatory Visit: Payer: Self-pay

## 2024-03-22 DIAGNOSIS — Z85828 Personal history of other malignant neoplasm of skin: Secondary | ICD-10-CM | POA: Diagnosis not present

## 2024-03-22 DIAGNOSIS — Z1283 Encounter for screening for malignant neoplasm of skin: Secondary | ICD-10-CM

## 2024-03-22 DIAGNOSIS — D1801 Hemangioma of skin and subcutaneous tissue: Secondary | ICD-10-CM | POA: Diagnosis not present

## 2024-03-22 DIAGNOSIS — D492 Neoplasm of unspecified behavior of bone, soft tissue, and skin: Secondary | ICD-10-CM | POA: Diagnosis not present

## 2024-03-22 DIAGNOSIS — L814 Other melanin hyperpigmentation: Secondary | ICD-10-CM

## 2024-03-22 DIAGNOSIS — W098XXA Fall on or from other playground equipment, initial encounter: Secondary | ICD-10-CM | POA: Diagnosis not present

## 2024-03-22 DIAGNOSIS — L720 Epidermal cyst: Secondary | ICD-10-CM | POA: Diagnosis not present

## 2024-03-22 DIAGNOSIS — L729 Follicular cyst of the skin and subcutaneous tissue, unspecified: Secondary | ICD-10-CM

## 2024-03-22 DIAGNOSIS — D229 Melanocytic nevi, unspecified: Secondary | ICD-10-CM

## 2024-03-22 DIAGNOSIS — C4492 Squamous cell carcinoma of skin, unspecified: Secondary | ICD-10-CM

## 2024-03-22 DIAGNOSIS — D0472 Carcinoma in situ of skin of left lower limb, including hip: Secondary | ICD-10-CM | POA: Diagnosis not present

## 2024-03-22 DIAGNOSIS — L821 Other seborrheic keratosis: Secondary | ICD-10-CM | POA: Diagnosis not present

## 2024-03-22 DIAGNOSIS — L719 Rosacea, unspecified: Secondary | ICD-10-CM | POA: Diagnosis not present

## 2024-03-22 DIAGNOSIS — L570 Actinic keratosis: Secondary | ICD-10-CM

## 2024-03-22 DIAGNOSIS — L578 Other skin changes due to chronic exposure to nonionizing radiation: Secondary | ICD-10-CM | POA: Diagnosis not present

## 2024-03-22 HISTORY — DX: Squamous cell carcinoma of skin, unspecified: C44.92

## 2024-03-22 MED ORDER — DOXYCYCLINE HYCLATE 20 MG PO TABS
20.0000 mg | ORAL_TABLET | Freq: Two times a day (BID) | ORAL | 1 refills | Status: DC
  Filled 2024-03-22 – 2024-05-18 (×2): qty 180, 90d supply, fill #0
  Filled 2024-08-18 (×2): qty 180, 90d supply, fill #1

## 2024-03-22 MED ORDER — METRONIDAZOLE 0.75 % EX GEL
Freq: Every evening | CUTANEOUS | 11 refills | Status: AC
  Filled 2024-03-22: qty 45, 30d supply, fill #0
  Filled 2024-09-20: qty 45, 30d supply, fill #1

## 2024-03-22 NOTE — Progress Notes (Signed)
 Follow-Up Visit   Subjective  Anthony Mosley is a 89 y.o. male who presents for the following: Skin Cancer Screening and Upper Body Skin Exam hx of SCC, Aks, Rosacea face, Doxycycline 20mg  2 po qd, Metronidazole 0.75% gel 3-4x/wk,  check spot L medial ankle, ~32m, getting larger  The patient presents for Upper Body Skin Exam (UBSE) for skin cancer screening and mole check. The patient has spots, moles and lesions to be evaluated, some may be new or changing and the patient may have concern these could be cancer.    The following portions of the chart were reviewed this encounter and updated as appropriate: medications, allergies, medical history  Review of Systems:  No other skin or systemic complaints except as noted in HPI or Assessment and Plan.  Objective  Well appearing patient in no apparent distress; mood and affect are within normal limits.  All skin waist up examined. Relevant physical exam findings are noted in the Assessment and Plan.  L medial upper ankle 1.8 x 1.0cm pink scaly thin plaque  L hand dorsum x 1, L arm x 2, R ant vertex scalp x 1, L mandible x 1, L jaw x 1, R lat cheek x 2 (8) Pink scaly macules  Assessment & Plan   NEOPLASM OF SKIN L medial upper ankle Skin / nail biopsy Type of biopsy: tangential   Informed consent: discussed and consent obtained   Anesthesia: the lesion was anesthetized in a standard fashion   Anesthesia comment:  Area prepped with alcohol Anesthetic:  1% lidocaine w/ epinephrine 1-100,000 buffered w/ 8.4% NaHCO3 Instrument used: flexible razor blade   Hemostasis achieved with: pressure, aluminum chloride and electrodesiccation   Outcome: patient tolerated procedure well   Post-procedure details: wound care instructions given   Post-procedure details comment:  Ointment and small bandage applied Specimen 1 - Surgical pathology Differential Diagnosis: ISK r/o SCC  Check Margins: no 1.8 x 1.0cm pink scaly thin plaque AK  (ACTINIC KERATOSIS) (8) L hand dorsum x 1, L arm x 2, R ant vertex scalp x 1, L mandible x 1, L jaw x 1, R lat cheek x 2 (8) Actinic keratoses are precancerous spots that appear secondary to cumulative UV radiation exposure/sun exposure over time. They are chronic with expected duration over 1 year. A portion of actinic keratoses will progress to squamous cell carcinoma of the skin. It is not possible to reliably predict which spots will progress to skin cancer and so treatment is recommended to prevent development of skin cancer.  Recommend daily broad spectrum sunscreen SPF 30+ to sun-exposed areas, reapply every 2 hours as needed.  Recommend staying in the shade or wearing long sleeves, sun glasses (UVA+UVB protection) and wide brim hats (4-inch brim around the entire circumference of the hat). Call for new or changing lesions. Destruction of lesion - L hand dorsum x 1, L arm x 2, R ant vertex scalp x 1, L mandible x 1, L jaw x 1, R lat cheek x 2 (8)  Destruction method: cryotherapy   Informed consent: discussed and consent obtained   Lesion destroyed using liquid nitrogen: Yes   Region frozen until ice ball extended beyond lesion: Yes   Outcome: patient tolerated procedure well with no complications   Post-procedure details: wound care instructions given   Additional details:  Prior to procedure, discussed risks of blister formation, small wound, skin dyspigmentation, or rare scar following cryotherapy. Recommend Vaseline ointment to treated areas while healing.  ROSACEA  Related Medications metroNIDAZOLE (METROGEL) 0.75 % gel Apply to nose and cheeks every night for rosacea. Skin cancer screening performed today.  Actinic Damage - Chronic condition, secondary to cumulative UV/sun exposure - diffuse scaly erythematous macules with underlying dyspigmentation - Recommend daily broad spectrum sunscreen SPF 30+ to sun-exposed areas, reapply every 2 hours as needed.  - Staying in the  shade or wearing long sleeves, sun glasses (UVA+UVB protection) and wide brim hats (4-inch brim around the entire circumference of the hat) are also recommended for sun protection.  - Call for new or changing lesions.  Lentigines, Seborrheic Keratoses, Hemangiomas - Benign normal skin lesions - Benign-appearing - Call for any changes  Melanocytic Nevi - Tan-brown and/or pink-flesh-colored symmetric macules and papules - Benign appearing on exam today - Observation - Call clinic for new or changing moles - Recommend daily use of broad spectrum spf 30+ sunscreen to sun-exposed areas.   HISTORY OF SQUAMOUS CELL CARCINOMA OF THE SKIN - No evidence of recurrence today- L inf mandible, R postauricular hairline - Recommend regular full body skin exams - Recommend daily broad spectrum sunscreen SPF 30+ to sun-exposed areas, reapply every 2 hours as needed.  - Call if any new or changing lesions are noted between office visits    ROSACEA face Exam: Mid face erythema with telangiectasias nose, malar cheeks  Chronic and persistent condition with duration or expected duration over one year. Condition is improving with treatment but not currently at goal.  Rosacea is a chronic progressive skin condition usually affecting the face of adults, causing redness and/or acne bumps. It is treatable but not curable. It sometimes affects the eyes (ocular rosacea) as well. It may respond to topical and/or systemic medication and can flare with stress, sun exposure, alcohol, exercise, topical steroids (including hydrocortisone/cortisone 10) and some foods.  Daily application of broad spectrum spf 30+ sunscreen to face is recommended to reduce flares.  Patient denies grittiness of the eyes  Treatment Plan Cont Doxycycline 20mg  1 po bid Cont Metronidazole 0.75% gel qd  Doxycycline should be taken with food to prevent nausea. Do not lay down for 30 minutes after taking. Be cautious with sun exposure and use  good sun protection while on this medication. Pregnant women should not take this medication.    EPIDERMAL INCLUSION CYST Exam: R occipital scalp 7.27mm firm blue/white papule  Benign-appearing. Exam most consistent with an epidermal inclusion cyst. Discussed that a cyst is a benign growth that can grow over time and sometimes get irritated or inflamed. Recommend observation if it is not bothersome. Discussed option of surgical excision to remove it if it is growing, symptomatic, or other changes noted. Please call for new or changing lesions so they can be evaluated.   Return in about 6 months (around 09/21/2024) for AK f/u.  I, Ardis Rowan, RMA, am acting as scribe for Willeen Niece, MD .   Documentation: I have reviewed the above documentation for accuracy and completeness, and I agree with the above.  Willeen Niece, MD

## 2024-03-22 NOTE — Patient Instructions (Addendum)

## 2024-03-24 LAB — SURGICAL PATHOLOGY

## 2024-03-26 ENCOUNTER — Telehealth: Payer: Self-pay | Admitting: Internal Medicine

## 2024-03-26 ENCOUNTER — Other Ambulatory Visit: Payer: Self-pay

## 2024-03-26 DIAGNOSIS — E785 Hyperlipidemia, unspecified: Secondary | ICD-10-CM

## 2024-03-26 NOTE — Telephone Encounter (Signed)
*  STAT* If patient is at the pharmacy, call can be transferred to refill team.   1. Which medications need to be refilled? (please list name of each medication and dose if known)  atorvastatin (LIPITOR) 80 MG tablet  2. Which pharmacy/location (including street and city if local pharmacy) is medication to be sent to? [facility] Phone: [phone]  Fax: [phone]     3. Do they need a 30 day or 90 day supply? 90

## 2024-03-29 ENCOUNTER — Other Ambulatory Visit: Payer: Self-pay

## 2024-03-29 ENCOUNTER — Telehealth: Payer: Self-pay

## 2024-03-29 MED ORDER — ATORVASTATIN CALCIUM 80 MG PO TABS
80.0000 mg | ORAL_TABLET | Freq: Every day | ORAL | 3 refills | Status: AC
  Filled 2024-03-29: qty 90, 90d supply, fill #0
  Filled 2024-06-29: qty 90, 90d supply, fill #1
  Filled 2024-09-27: qty 90, 90d supply, fill #2
  Filled 2025-01-07: qty 90, 90d supply, fill #3

## 2024-03-29 NOTE — Telephone Encounter (Signed)
 Advised pt of bx results, scheduled EDC appointment./sh

## 2024-03-29 NOTE — Telephone Encounter (Signed)
-----   Message from Artemio Larry sent at 03/24/2024  5:42 PM EDT ----- 1. Skin, L medial upper ankle :       SQUAMOUS CELL CARCINOMA IN SITU, BASE INVOLVED   SCCIS skin cancer- needs EDC - please call patient

## 2024-04-21 ENCOUNTER — Encounter: Payer: Self-pay | Admitting: Dermatology

## 2024-04-21 ENCOUNTER — Ambulatory Visit: Admitting: Dermatology

## 2024-04-21 DIAGNOSIS — L578 Other skin changes due to chronic exposure to nonionizing radiation: Secondary | ICD-10-CM | POA: Diagnosis not present

## 2024-04-21 DIAGNOSIS — W908XXA Exposure to other nonionizing radiation, initial encounter: Secondary | ICD-10-CM

## 2024-04-21 DIAGNOSIS — D099 Carcinoma in situ, unspecified: Secondary | ICD-10-CM

## 2024-04-21 DIAGNOSIS — D0472 Carcinoma in situ of skin of left lower limb, including hip: Secondary | ICD-10-CM | POA: Diagnosis not present

## 2024-04-21 NOTE — Progress Notes (Signed)
   Follow-Up Visit   Subjective  Anthony Mosley is a 89 y.o. male who presents for the following: SCC in situ, biopsy proven, of the left medial upper ankle. Here for [facility] today.   The patient has spots, moles and lesions to be evaluated, some may be new or changing.     The following portions of the chart were reviewed this encounter and updated as appropriate: medications, allergies, medical history  Review of Systems:  No other skin or systemic complaints except as noted in HPI or Assessment and Plan.  Objective  Well appearing patient in no apparent distress; mood and affect are within normal limits.  A focused examination was performed of the following areas: Left ankle  Relevant physical exam findings are noted in the Assessment and Plan.  left medial upper ankle Pink biopsy site.  Assessment & Plan  ACTINIC DAMAGE - chronic, secondary to cumulative UV radiation exposure/sun exposure over time - diffuse scaly erythematous macules with underlying dyspigmentation - Recommend daily broad spectrum sunscreen SPF 30+ to sun-exposed areas, reapply every 2 hours as needed.  - Recommend staying in the shade or wearing long sleeves, sun glasses (UVA+UVB protection) and wide brim hats (4-inch brim around the entire circumference of the hat). - Call for new or changing lesions.  SQUAMOUS CELL CARCINOMA IN SITU left medial upper ankle Destruction of lesion  Destruction method: electrodesiccation and curettage   Informed consent: discussed and consent obtained   Timeout:  patient name, date of birth, surgical site, and procedure verified Anesthesia: the lesion was anesthetized in a standard fashion   Anesthetic:  1% lidocaine  w/ epinephrine  1-100,000 local infiltration Curettage performed in three different directions: Yes   Electrodesiccation performed over the curetted area: Yes   Final wound size (cm):  1.5 Hemostasis achieved with:  pressure, aluminum chloride and  electrodesiccation Outcome: patient tolerated procedure well with no complications   Post-procedure details: wound care instructions given   Post-procedure details comment:  Ointment and bandage applied. Biopsy proven.   Return as scheduled, for AKs, hx SCC.  IBernardine Bridegroom, CMA, am acting as scribe for Artemio Larry, MD .   Documentation: I have reviewed the above documentation for accuracy and completeness, and I agree with the above.  Artemio Larry, MD

## 2024-04-21 NOTE — Patient Instructions (Signed)
 Electrodesiccation and Curettage ("Scrape and Burn") Wound Care Instructions  Leave the original bandage on for 24 hours if possible.  If the bandage becomes soaked or soiled before that time, it is OK to remove it and examine the wound.  A small amount of post-operative bleeding is normal.  If excessive bleeding occurs, remove the bandage, place gauze over the site and apply continuous pressure (no peeking) over the area for 30 minutes. If this does not work, please call our clinic as soon as possible or page your doctor if it is after hours.   Once a day, cleanse the wound with soap and water. It is fine to shower. If a thick crust develops you may use a Q-tip dipped into dilute hydrogen peroxide (mix 1:1 with water) to dissolve it.  Hydrogen peroxide can slow the healing process, so use it only as needed.    After washing, apply petroleum jelly (Vaseline) or an antibiotic ointment if your doctor prescribed one for you, followed by a bandage.    For best healing, the wound should be covered with a layer of ointment at all times. If you are not able to keep the area covered with a bandage to hold the ointment in place, this may mean re-applying the ointment several times a day.  Continue this wound care until the wound has healed and is no longer open. It may take several weeks for the wound to heal and close.  Itching and mild discomfort is normal during the healing process.  If you have any discomfort, you can take Tylenol (acetaminophen) or ibuprofen as directed on the bottle. (Please do not take these if you have an allergy to them or cannot take them for another reason).  Some redness, tenderness and white or yellow material in the wound is normal healing.  If the area becomes very sore and red, or develops a thick yellow-green material (pus), it may be infected; please notify us.    Wound healing continues for up to one year following surgery. It is not unusual to experience pain in the scar  from time to time during the interval.  If the pain becomes severe or the scar thickens, you should notify the office.    A slight amount of redness in a scar is expected for the first six months.  After six months, the redness will fade and the scar will soften and fade.  The color difference becomes less noticeable with time.  If there are any problems, return for a post-op surgery check at your earliest convenience.  To improve the appearance of the scar, you can use silicone scar gel, cream, or sheets (such as Mederma or Serica) every night for up to one year. These are available over the counter (without a prescription).  Please call our office at [phone] for any questions or concerns.   Due to recent changes in healthcare laws, you may see results of your pathology and/or laboratory studies on MyChart before the doctors have had a chance to review them. We understand that in some cases there may be results that are confusing or concerning to you. Please understand that not all results are received at the same time and often the doctors may need to interpret multiple results in order to provide you with the best plan of care or course of treatment. Therefore, we ask that you please give [place] 2 business days to thoroughly review all your results before contacting the office for clarification. Should we see  a critical lab result, you will be contacted sooner.   If You Need Anything After Your Visit  If you have any questions or concerns for your doctor, please call our main line at [phone] and press option 4 to reach your doctor's medical assistant. If no one answers, please leave a voicemail as directed and we will return your call as soon as possible. Messages left after 4 pm will be answered the following business day.   You may also send [place] a message via MyChart. We typically respond to MyChart messages within 1-2 business days.  For prescription refills, please ask your pharmacy to  contact our office. Our fax number is [phone].  If you have an urgent issue when the clinic is closed that cannot wait until the next business day, you can page your doctor at the number below.    Please note that while we do our best to be available for urgent issues outside of office hours, we are not available 24/7.   If you have an urgent issue and are unable to reach [place], you may choose to seek medical care at your doctor's office, retail clinic, urgent care center, or emergency room.  If you have a medical emergency, please immediately call 911 or go to the emergency department.  Pager Numbers  - Dr. Gwen Pounds: [phone]  - Dr. Roseanne Reno: [phone]  - Dr. Katrinka Blazing: [phone]   In the event of inclement weather, please call our main line at [phone] for an update on the status of any delays or closures.  Dermatology Medication Tips: Please keep the boxes that topical medications come in in order to help keep track of the instructions about where and how to use these. Pharmacies typically print the medication instructions only on the boxes and not directly on the medication tubes.   If your medication is too expensive, please contact our office at [phone] option 4 or send [place] a message through MyChart.   We are unable to tell what your co-pay for medications will be in advance as this is different depending on your insurance coverage. However, we may be able to find a substitute medication at lower cost or fill out paperwork to get insurance to cover a needed medication.   If a prior authorization is required to get your medication covered by your insurance company, please allow [place] 1-2 business days to complete this process.  Drug prices often vary depending on where the prescription is filled and some pharmacies may offer cheaper prices.  The website www.goodrx.com contains coupons for medications through different pharmacies. The prices here do not account for what  the cost may be with help from insurance (it may be cheaper with your insurance), but the website can give you the price if you did not use any insurance.  - You can print the associated coupon and take it with your prescription to the pharmacy.  - You may also stop by our office during regular business hours and pick up a GoodRx coupon card.  - If you need your prescription sent electronically to a different pharmacy, notify our office through [facility] or by phone at [phone] option 4.     Si Usted Necesita Algo Despus de Su Visita  Tambin puede enviarnos un mensaje a travs de Clinical cytogeneticist. Por lo general respondemos a los mensajes de MyChart en el transcurso de 1 a 2 das hbiles.  Para renovar recetas, por favor pida a su farmacia que se ponga en contacto  con nuestra oficina. Annie Sable de fax es [place] [phone].  Si tiene un asunto urgente cuando la clnica est cerrada y que no puede esperar hasta el siguiente da hbil, puede llamar/localizar a su doctor(a) al nmero que aparece a continuacin.   Por favor, tenga en cuenta que aunque hacemos todo lo posible para estar disponibles para asuntos urgentes fuera del horario de [place], no estamos disponibles las 24 horas del da, los 7 [address]  [address] de la [place].   Si tiene un problema urgente y no puede comunicarse con nosotros, puede optar por buscar atencin mdica  en el consultorio de su doctor(a), en una clnica privada, en un centro de atencin urgente o en una sala de emergencias.  Si tiene Engineer, drilling, por favor llame inmediatamente al 911 o vaya a la sala de emergencias.  Nmeros de bper  - Dr. Gwen Pounds: [phone]  - Dra. Roseanne Reno: [phone]  - Dr. Katrinka Blazing: [phone]   En caso de inclemencias del tiempo, por favor llame a Lacy Duverney principal al [phone] para una actualizacin sobre el [place] de cualquier retraso o cierre.  Consejos para la medicacin en dermatologa: Por favor, guarde las  cajas en las que vienen los medicamentos de uso tpico para ayudarle a seguir las instrucciones sobre dnde y cmo usarlos. Las farmacias generalmente imprimen las instrucciones del medicamento slo en las cajas y no directamente en los tubos del [place].   Si su medicamento es muy caro, por favor, pngase en contacto con Rolm Gala llamando al [phone] y presione la opcin 4 o envenos un mensaje a travs de Clinical cytogeneticist.   No podemos decirle cul ser su copago por los medicamentos por adelantado ya que esto es diferente dependiendo de la cobertura de su seguro. Sin embargo, es posible que podamos encontrar un medicamento sustituto a Audiological scientist un formulario para que el seguro cubra el medicamento que se considera necesario.   Si se requiere una autorizacin previa para que su compaa de seguros [place] su medicamento, por favor permtanos de 1 a 2 das hbiles para completar [address]  Los precios de los medicamentos varan con frecuencia dependiendo del Environmental consultant de dnde se surte la receta y alguna farmacias pueden ofrecer precios ms baratos.  El sitio web www.goodrx.com tiene cupones para medicamentos de Health and safety inspector. Los precios aqu no tienen en cuenta lo que podra costar con la ayuda del seguro (puede ser ms barato con su seguro), pero el sitio web puede darle el precio si no utiliz Tourist information centre manager.  - Puede imprimir el cupn correspondiente y llevarlo con su receta a la farmacia.  - Tambin puede pasar por nuestra oficina durante el horario de atencin regular y Education officer, museum una tarjeta de cupones de GoodRx.  - Si necesita que su receta se enve electrnicamente a una farmacia diferente, informe a nuestra oficina a travs de MyChart de [place] o por telfono llamando al [phone] y presione la opcin 4.

## 2024-05-18 ENCOUNTER — Other Ambulatory Visit: Payer: Self-pay

## 2024-06-02 ENCOUNTER — Ambulatory Visit: Admitting: Internal Medicine

## 2024-06-03 ENCOUNTER — Ambulatory Visit

## 2024-06-03 ENCOUNTER — Other Ambulatory Visit: Payer: Self-pay

## 2024-06-03 ENCOUNTER — Ambulatory Visit: Attending: Medical | Admitting: Medical

## 2024-06-03 ENCOUNTER — Encounter: Payer: Self-pay | Admitting: Medical

## 2024-06-03 VITALS — BP 118/62 | HR 48 | Ht 73.0 in | Wt 192.8 lb

## 2024-06-03 DIAGNOSIS — I429 Cardiomyopathy, unspecified: Secondary | ICD-10-CM

## 2024-06-03 DIAGNOSIS — I491 Atrial premature depolarization: Secondary | ICD-10-CM

## 2024-06-03 DIAGNOSIS — R001 Bradycardia, unspecified: Secondary | ICD-10-CM

## 2024-06-03 DIAGNOSIS — I480 Paroxysmal atrial fibrillation: Secondary | ICD-10-CM | POA: Diagnosis not present

## 2024-06-03 MED ORDER — DAPAGLIFLOZIN PROPANEDIOL 10 MG PO TABS
10.0000 mg | ORAL_TABLET | Freq: Every day | ORAL | 3 refills | Status: AC
  Filled 2024-06-03: qty 30, 30d supply, fill #0
  Filled 2024-06-29: qty 30, 30d supply, fill #1
  Filled 2024-07-29: qty 30, 30d supply, fill #2
  Filled 2024-08-25: qty 30, 30d supply, fill #3
  Filled 2024-09-27: qty 30, 30d supply, fill #4
  Filled 2024-10-25: qty 30, 30d supply, fill #5
  Filled 2024-11-23: qty 30, 30d supply, fill #6
  Filled 2024-11-24: qty 90, 90d supply, fill #6

## 2024-06-03 NOTE — Patient Instructions (Signed)
 Medication Instructions:  Your physician recommends the following medication changes.  START TAKING: Farxiga 10 mg daily  *If you need a refill on your cardiac medications before your next appointment, please call your pharmacy*  Lab Work: None ordered at this time   Testing/Procedures: Your physician has recommended that you wear a Zio monitor.   This monitor is a medical device that records the heart's electrical activity. Doctors most often use these monitors to diagnose arrhythmias. Arrhythmias are problems with the speed or rhythm of the heartbeat. The monitor is a small device applied to your chest. You can wear one while you do your normal daily activities. While wearing this monitor if you have any symptoms to push the button and record what you felt. Once you have worn this monitor for the period of time provider prescribed (7 days have been ordered), you will return the monitor device in the postage paid box. Once it is returned they will download the data collected and provide us  with a report which the provider will then review and we will call you with those results. Important tips:  Avoid showering during the first 24 hours of wearing the monitor. Avoid excessive sweating to help maximize wear time. Do not submerge the device, no hot tubs, and no swimming pools. Keep any lotions or oils away from the patch. After 24 hours you may shower with the patch on. Take brief showers with your back facing the shower head.  Do not remove patch once it has been placed because that will interrupt data and decrease adhesive wear time. Push the button when you have any symptoms and write down what you were feeling. Once you have completed wearing your monitor, remove and place into box which has postage paid and place in your outgoing mailbox.  If for some reason you have misplaced your box then call our office and we can provide another box and/or mail it off for you.   Follow-Up: At [facility], you and your health needs are our priority.  As part of our continuing mission to provide you with exceptional heart care, our providers are all part of one team.  This team includes your primary Cardiologist (physician) and Advanced Practice Providers or APPs (Physician Assistants and Nurse Practitioners) who all work together to provide you with the care you need, when you need it.  Your next appointment:   1 month(s)  Provider:   You may see Sammy Crisp, MD or Cadence Gennaro Khat, PA-C

## 2024-06-03 NOTE — Progress Notes (Signed)
 Cardiology Office Note   Date:  06/03/2024  ID:  Anthony Mosley, DOB 12/25/1934, MRN [id number] PCP: Raina Bunting, DO  [facility] Providers Cardiologist:  Sammy Crisp, MD    History of Present Illness Anthony Mosley is a 89 y.o. male with a history of nonobstructive coronary artery disease by LHC 2019, chronic HFrEF with partially recovered LVEF, MAP versus PAF, pulmonary hypertension, thoracic aortic aneurysm, mild to moderate MR, hyperlipidemia, and CKD stage III who presents for follow-up of heart failure.  Most recent echo showed decline in EF of 35 to 40%.  Prior EF was 45 to 50%.  Patient was last seen 02/25/2024 reported stable dyspnea on exertion and dependent edema.  Echo images reviewed by MD and felt to be 40 to 45%.  GDMT was started with lisinopril .  No beta-blocker due to resting bradycardia.  Today, the patient reports he is overall doing well. He denies chest pain or SOB. He reports occasional swelling on his feet that improves over night. He denies lightheadedness and dizziness. He denies orthopnea or pnd. He had sore throat  since the last visit. EKG shows SB with a HR of 48bpm. He rarely needs lasix .    Studies Reviewed EKG Interpretation Date/Time:  Thursday June 03 2024 11:23:11 EDT Ventricular Rate:  48 PR Interval:  182 QRS Duration:  158 QT Interval:  532 QTC Calculation: 475 R Axis:   -84  Text Interpretation: Sinus bradycardia Left axis deviation Right bundle branch block When compared with ECG of 29-Jan-2024 08:01, Premature atrial complexes are no longer Present Criteria for Anterior infarct are no longer Present Confirmed by Anthony Mosley, Anthony Mosley ([id number]) on 06/03/2024 11:44:25 AM    Echo 02/2024 1. Left ventricular ejection fraction, by estimation, is 35 to 40%. Left  ventricular ejection fraction by 3D volume is 46 %. The left ventricle has  moderately decreased function. The left ventricle demonstrates global  hypokinesis. The  left ventricular  internal cavity size was mildly dilated. Left ventricular diastolic  parameters are consistent with Grade I diastolic dysfunction (impaired  relaxation). The average left ventricular global longitudinal strain is  -16.9 %. The global longitudinal strain is  normal.   2. Right ventricular systolic function is normal. The right ventricular  size is normal. There is normal pulmonary artery systolic pressure. The  estimated right ventricular systolic pressure is 27.5 mmHg.   3. Left atrial size was moderately dilated.   4. The mitral valve is normal in structure. Mild to moderate mitral valve  regurgitation. No evidence of mitral stenosis.   5. The aortic valve is tricuspid. Aortic valve regurgitation is moderate.  Aortic valve sclerosis is present, with no evidence of aortic valve  stenosis.   6. There is mild dilatation of the aortic root, measuring 43 mm.   7. The inferior vena cava is normal in size with greater than 50%  respiratory variability, suggesting right atrial pressure of 3 mmHg.   Echo 2021  1. Left ventricular ejection fraction, by estimation, is 45 to 50%. The  left ventricle has mildly decreased function. The left ventricle  demonstrates global hypokinesis. There is mild left ventricular  hypertrophy. Left ventricular diastolic parameters  are indeterminate.   2. Right ventricular systolic function is normal. The right ventricular  size is normal.   3. Right atrial size was mildly dilated.   4. The mitral valve is normal in structure. Mild mitral valve  regurgitation.   5. The aortic valve is tricuspid. Aortic valve regurgitation  is mild.   6. Aortic dilatation noted. There is mild dilatation of the aortic root,  measuring 40 mm. There is moderate dilatation of the ascending aorta,  measuring 44 mm.   Heart monitor 2019  The patient was monitored for 13 days, 23 hours. The predominant rhythm was sinus with an average rate of 75 bpm (range 48 to  202 bpm). Patient with PACs and PVCs occurred. Frequent irregularly irregular rhythm was observed. Several strips are suspicious for atrial fibrillation, though multifocal atrial tachycardia cannot be excluded. The longest episode lasted 40 minutes, 45 seconds. Atrial fibrillation burden ~30%. No sustained ventricular arrhythmia or prolonged pause was identified. Patient triggered event corresponds to atrial fibrillation.   Predominantly sinus rhythm with likely atrial fibrillation (30% burden), though episodes of multifocal atrial tachycardia cannot be excluded.      Physical Exam VS:  BP 118/62   Pulse (!) 48   Ht 6' 1 (1.854 m)   Wt 192 lb 12.8 oz (87.5 kg)   SpO2 96%   BMI 25.44 kg/m    Wt Readings from Last 3 Encounters:  06/03/24 192 lb 12.8 oz (87.5 kg)  02/25/24 198 lb 9.6 oz (90.1 kg)  01/29/24 199 lb 3.2 oz (90.4 kg)    GEN: Well nourished, well developed in no acute distress NECK: No JVD; No carotid bruits CARDIAC: RRR, no murmurs, rubs, gallops RESPIRATORY:  Clear to auscultation without rales, wheezing or rhonchi  ABDOMEN: Soft, non-tender, non-distended EXTREMITIES:  No edema; No deformity   ASSESSMENT AND PLAN  Chronic HFrEF NICM Most recent echo showed reduced LVEF 35-40%, though to MD it appeared to show LVEF 40-45%. He was started on lisinopril  2.5mg  daily. The patient reports dependent edema. I will start Farxiga 10mg  daily. No BB due to bradycardia. Continue GDMT as able.   Sinus bradycardia EKG shows SB with heart rate of 48bpm. He is not on AV nodal blocking agents at baseline. He denies pre-syncope, lightheadedness, dizziness, syncope, fatigue, or weakness. I will order a 1 week heart monitor to assess for high grade heart block. PCP is doing labs next week.   Paroxysmal Afib EKG shows SB. Continue Eliquis  5mg  BID. Not on rate controlling medication at baseline.  Nonobstructive CAD The patient denies angina. No ASA given Eliquis . No further work-up at  this time.  Thoracic aortic aneurysm Ascending aorta measured at 4.3 cm. Will continue to monitor annually.        Dispo: Follow-up in 1 month  Signed, Anthony Streater Rebekah Canada, PA-C

## 2024-06-09 ENCOUNTER — Other Ambulatory Visit: Payer: Self-pay

## 2024-06-09 DIAGNOSIS — Z0000 Encounter for general adult medical examination without abnormal findings: Secondary | ICD-10-CM

## 2024-06-09 DIAGNOSIS — I712 Thoracic aortic aneurysm, without rupture, unspecified: Secondary | ICD-10-CM | POA: Diagnosis not present

## 2024-06-09 DIAGNOSIS — E785 Hyperlipidemia, unspecified: Secondary | ICD-10-CM | POA: Diagnosis not present

## 2024-06-09 DIAGNOSIS — R351 Nocturia: Secondary | ICD-10-CM

## 2024-06-09 DIAGNOSIS — I480 Paroxysmal atrial fibrillation: Secondary | ICD-10-CM

## 2024-06-09 DIAGNOSIS — R7303 Prediabetes: Secondary | ICD-10-CM

## 2024-06-10 LAB — COMPLETE METABOLIC PANEL WITHOUT GFR
AG Ratio: 1.7 (calc) (ref 1.0–2.5)
ALT: 24 U/L (ref 9–46)
AST: 28 U/L (ref 10–35)
Albumin: 4 g/dL (ref 3.6–5.1)
Alkaline phosphatase (APISO): 61 U/L (ref 35–144)
BUN/Creatinine Ratio: 15 (calc) (ref 6–22)
BUN: 24 mg/dL (ref 7–25)
CO2: 28 mmol/L (ref 20–32)
Calcium: 9.1 mg/dL (ref 8.6–10.3)
Chloride: 103 mmol/L (ref 98–110)
Creat: 1.65 mg/dL — ABNORMAL HIGH (ref 0.70–1.22)
Globulin: 2.3 g/dL (ref 1.9–3.7)
Glucose, Bld: 139 mg/dL (ref 65–139)
Potassium: 4.5 mmol/L (ref 3.5–5.3)
Sodium: 139 mmol/L (ref 135–146)
Total Bilirubin: 1.8 mg/dL — ABNORMAL HIGH (ref 0.2–1.2)
Total Protein: 6.3 g/dL (ref 6.1–8.1)

## 2024-06-10 LAB — HEMOGLOBIN A1C
Hgb A1c MFr Bld: 5.9 % — ABNORMAL HIGH (ref <5.7)
Mean Plasma Glucose: 123 mg/dL
eAG (mmol/L): 6.8 mmol/L

## 2024-06-10 LAB — CBC WITH DIFFERENTIAL/PLATELET
Absolute Lymphocytes: 1875 {cells}/uL (ref 850–3900)
Absolute Monocytes: 564 {cells}/uL (ref 200–950)
Basophils Absolute: 82 {cells}/uL (ref 0–200)
Basophils Relative: 0.9 %
Eosinophils Absolute: 137 {cells}/uL (ref 15–500)
Eosinophils Relative: 1.5 %
HCT: 41.2 % (ref 38.5–50.0)
Hemoglobin: 13.2 g/dL (ref 13.2–17.1)
MCH: 30.6 pg (ref 27.0–33.0)
MCHC: 32 g/dL (ref 32.0–36.0)
MCV: 95.6 fL (ref 80.0–100.0)
MPV: 10.4 fL (ref 7.5–12.5)
Monocytes Relative: 6.2 %
Neutro Abs: 6443 {cells}/uL (ref 1500–7800)
Neutrophils Relative %: 70.8 %
Platelets: 180 10*3/uL (ref 140–400)
RBC: 4.31 10*6/uL (ref 4.20–5.80)
RDW: 12.6 % (ref 11.0–15.0)
Total Lymphocyte: 20.6 %
WBC: 9.1 10*3/uL (ref 3.8–10.8)

## 2024-06-10 LAB — LIPID PANEL
Cholesterol: 101 mg/dL (ref <200)
HDL: 45 mg/dL (ref >=40)
LDL Cholesterol (Calc): 42 mg/dL
Non-HDL Cholesterol (Calc): 56 mg/dL (ref <130)
Total CHOL/HDL Ratio: 2.2 (calc) (ref <5.0)
Triglycerides: 60 mg/dL (ref <150)

## 2024-06-10 LAB — TSH: TSH: 3.9 m[IU]/L (ref 0.40–4.50)

## 2024-06-10 LAB — PSA: PSA: 0.85 ng/mL (ref <=4.00)

## 2024-06-16 ENCOUNTER — Other Ambulatory Visit: Payer: Self-pay

## 2024-06-16 ENCOUNTER — Encounter: Payer: Self-pay | Admitting: Family Medicine

## 2024-06-16 ENCOUNTER — Ambulatory Visit (INDEPENDENT_AMBULATORY_CARE_PROVIDER_SITE_OTHER): Payer: Self-pay | Admitting: Family Medicine

## 2024-06-16 VITALS — BP 98/60 | HR 55 | Ht 73.0 in | Wt 190.6 lb

## 2024-06-16 DIAGNOSIS — R7303 Prediabetes: Secondary | ICD-10-CM | POA: Diagnosis not present

## 2024-06-16 DIAGNOSIS — E785 Hyperlipidemia, unspecified: Secondary | ICD-10-CM

## 2024-06-16 DIAGNOSIS — Z23 Encounter for immunization: Secondary | ICD-10-CM

## 2024-06-16 DIAGNOSIS — I480 Paroxysmal atrial fibrillation: Secondary | ICD-10-CM | POA: Diagnosis not present

## 2024-06-16 DIAGNOSIS — I5020 Unspecified systolic (congestive) heart failure: Secondary | ICD-10-CM | POA: Diagnosis not present

## 2024-06-16 DIAGNOSIS — Z0000 Encounter for general adult medical examination without abnormal findings: Secondary | ICD-10-CM

## 2024-06-16 MED ORDER — GLUCOSE BLOOD VI STRP
1.0000 | ORAL_STRIP | Freq: Every day | 12 refills | Status: AC
  Filled 2024-06-16: qty 100, 100d supply, fill #0
  Filled 2024-12-06: qty 100, 100d supply, fill #1

## 2024-06-16 NOTE — Patient Instructions (Addendum)
 Thank you for coming to the office today.  Keep up the great work overall  Prevnar-20 vaccine.  I would suggest Metamucil or fiber supplement OTC gummy or powder  For Constipation (less frequent bowel movement that can be hard dry or involve straining).  - May take OTC Fiber supplement (metamucil powder or pill/gummy) - May try OTC Probiotic  --------------------------------  ASK YOUR CARDIOLOGIST ABOUT PAUSING Lisinopril  2.5mg  daily - for 2+ weeks. This can cause the throat tickle side effect.   If it does not go away, let me know we can consider treating stomach acid medicine to see if this reduces the throat symptoms. Tickle in back of throat can be caused by heartburn or acid reflux - Consider Omeprazole or Pantoprazole  for stomach acid in future   Recent Labs    12/03/23 0838 06/09/24 0806  HGBA1C 6.0* 5.9*      Please schedule a Follow-up Appointment to: Return for 6 month PreDM A1c.  If you have any other questions or concerns, please feel free to call the office or send a message through MyChart. You may also schedule an earlier appointment if necessary.  Additionally, you may be receiving a survey about your experience at our office within a few days to 1 week by e-mail or mail. We value your feedback.  Marsa Officer, DO [facility], [place]

## 2024-06-16 NOTE — Progress Notes (Signed)
 Subjective:    Patient ID: Anthony Mosley, male    DOB: [birth year], 89 y.o.   MRN: [id number]  Anthony Mosley is a 89 y.o. male presenting on 06/16/2024 for Annual Exam   HPI  Discussed the use of AI scribe software for clinical note transcription with the patient, who gave verbal consent to proceed.  History of Present Illness   Anthony Mosley is an 89 year old male who presents for a yearly checkup.  Altered bowel habits - Change in bowel movements since starting collagen supplements for joint health - Bowel movements described as 'blotches out the chunks' - Increased intestinal gas - Occasional mucus present when wiping - No blood in stool - Last colonoscopy approximately seven years ago, informed it would be his last  Unintentional weight loss - Gradual weight loss over time - Current weight 184 pounds, previously 190 pounds - Attributes some weight fluctuation to dietary changes, such as eating cornbread, which he feels helps him gain weight  Chronic throat irritation and cough - Persistent 'tickle' or irritation in the throat prompting cough - Symptoms occur in the same location each time - Duration of symptoms approximately two years - Currently taking lisinopril  2.5 mg, which he has been on and off over the years, and questions if symptoms are related to this medication  Cardiac dysfunction and monitoring - Under care of a cardiologist - Currently wearing a heart monitor for a 30-day follow-up - Taking a diuretic ('fluid pill') for heart function - History of echocardiogram showing reduced cardiac pumping function  Osteoarthritis Bilateral   Chronic Knee Pain, bilateral lower ext S/p R knee TKR =- Dr Mardee [facility] Improved overall after completing physical therapy post op   CKD-III Stable, previous issue. Last labs reviewed Cr 1.65 stable from past 1 year 1.4 to 1.5 range   Pre-Diabetes Last A1c 5.9 (05/2024) He checks CBG occasional, 1 x  per week.  Usually controlled < 100 fasting. He does eat some sweets, ice cream dessert   Hyperlipidemia Last result LDL 42, controlled on med On Atorvastatin  80mg  daily needs refill   Major depression recurrent, mild Currently not on therapy Mood is stable, see below  Aortic Atherosclerosis Thoracic aortic aneurysm On imaging On anticoagulation   HFrEF PAF Followed by Cardiology On Anticoagulation Eliquis  5mg  TWICE A DAY Repeat ECHO with mild reduced 35-40% and has heart monitor currently   Health Maintenance:   PSA 0.85 (negative), past year 0.79  Due Prevnar-20 today  Interested in RSV vaccine  Last Colonoscopy age 24.     06/16/2024    8:16 AM 10/15/2023    2:33 PM 09/05/2023    8:11 AM  Depression screen PHQ 2/9  Decreased Interest 0 0 0  Down, Depressed, Hopeless 0 0 0  PHQ - 2 Score 0 0 0  Altered sleeping 0  0  Tired, decreased energy 0  0  Change in appetite 0  0  Feeling bad or failure about yourself  0  0  Trouble concentrating 0  0  Moving slowly or fidgety/restless 0  0  Suicidal thoughts 0  0  PHQ-9 Score 0  0  Difficult doing work/chores Not difficult at all  Not difficult at all       06/16/2024    8:16 AM 10/15/2023    2:33 PM 06/03/2023    8:25 AM 11/25/2022    8:16 AM  GAD 7 : Generalized Anxiety Score  Nervous, Anxious, on Edge 0 0  0 0  Control/stop worrying 0 0 0 0  Worry too much - different things 0 0 0 0  Trouble relaxing 0 0 0 0  Restless 0 0 0 0  Easily annoyed or irritable 0 0 0 0  Afraid - awful might happen 0 0 0 0  Total GAD 7 Score 0 0 0 0  Anxiety Difficulty Not difficult at all   Not difficult at all     Past Medical History:  Diagnosis Date   Actinic keratosis 02/04/2023   L post neck   Aortic atherosclerosis (HCC)    Aortic root dilatation (HCC)    a.) TTE 05/29/2017 --> measured 28 mm. b.)TTE 10/25/2020 --> measured 40 mm.; 45 mm by CTA chest, most recently 8/22   Arthritis    B12 deficiency    Bilateral lower  extremity edema    BPH (benign prostatic hyperplasia)    Bulla of lung (HCC)    a. seen on CT.   CAD (coronary artery disease)    a.  Elective R/LHC 06/13/17 showed mild-moderate nonobstructive RCA CAD with mild LAD myocardial bridging but no significant obstructive CAD; there was mildly elevated L heart filling pressure, moderately elevated R heart filling pressure, and mild pulm HTN.   Chronic anticoagulation    Apixaban    Chronic knee pain    CKD (chronic kidney disease), stage III (HCC)    Cough    DJD (degenerative joint disease)    Erectile dysfunction    Former tobacco use    GERD (gastroesophageal reflux disease)    occ-tums prn   Hemorrhoids    HFrEF (heart failure with reduced ejection fraction) (HCC)    Hyperbilirubinemia    Hyperlipidemia    Mild aortic insufficiency    Mild pulmonary hypertension (HCC)    Moderate mitral regurgitation    NICM (nonischemic cardiomyopathy) (HCC)    a. EF 40-45% by echo 05/2017, improved to 45-50% by echo 10/2020.   PAC (premature atrial contraction)    Paroxysmal A-fib (HCC)    Pleural effusion, left 11/2018   Pneumonia    Prolonged QT syndrome    RBBB    Rosacea    Ruptured spleen    SCC (squamous cell carcinoma) 03/22/2024   SCC IS,  L medial upper ankle, [facility] 04/21/2024   Skin cancer    Left wrist - tx by Dr Charlott in the past.   Squamous cell carcinoma of skin 11/27/2018   L inf mandible    Squamous cell carcinoma of skin 09/04/2023   R postauricular hairline   Thoracic aortic aneurysm (HCC)    a.) TTE 05/29/2017 --> 4.2 cm . b.) CT 12/2017 --> measured 4.4 cm, imaged incompletely at 4.3cm in 10/2018 CT. c.) CTA 07/27/2021 --> measured 4.5 cm.   Varicocele    Wears dentures    partial upper   Wheezing    Past Surgical History:  Procedure Laterality Date   ABDOMINAL SURGERY     due to ruptured spleen   APPENDECTOMY     Dr Rosena   CARDIAC CATHETERIZATION Left 07/20/2004   Procedure: CARDIAC CATHETERIZATION; Location:  [facility]; Surgeon: Wolm Nida, MD   CARDIAC CATHETERIZATION Left 01/29/2001   Procedure: CARDIAC CATHETERIZATION; Location: [facility]; Surgeon: Denyse Bathe, MD   CATARACT EXTRACTION W/PHACO Right 10/24/2015   Procedure: CATARACT EXTRACTION PHACO AND INTRAOCULAR LENS PLACEMENT (IOC);  Surgeon: Elsie Carmine, MD;  Location: [facility];  Service: Ophthalmology;  Laterality: Right;  US  00:52 AP% 23.5 CDE 12.35 fluid pack lot #  [id number]   CATARACT EXTRACTION W/PHACO Left 11/14/2015   Procedure: CATARACT EXTRACTION PHACO AND INTRAOCULAR LENS PLACEMENT (IOC);  Surgeon: Elsie Carmine, MD;  Location: [facility];  Service: Ophthalmology;  Laterality: Left;  US  00:55 AP% 19.1 CDE 10.66 fluid pack lot # [id number]   COLONOSCOPY  1987   COLONOSCOPY WITH PROPOFOL  N/A 08/22/2017   Procedure: COLONOSCOPY WITH PROPOFOL ;  Surgeon: Jinny Carmine, MD;  Location: [facility];  Service: Gastroenterology;  Laterality: N/A;   HERNIA REPAIR     umbilical/ Dr Rosena FONTANA HERNIA REPAIR Right 02/04/2019   Procedure: HERNIA REPAIR INGUINAL WITH MESH;  Surgeon: Desiderio Schanz, MD;  Location: [facility];  Service: General;  Laterality: Right;   IR ANGIOGRAM VISCERAL SELECTIVE  10/29/2018   IR EMBO ART  VEN HEMORR LYMPH EXTRAV  INC GUIDE ROADMAPPING  10/29/2018   IR THORACENTESIS ASP PLEURAL SPACE W/IMG GUIDE  11/23/2018   IR US  GUIDE VASC ACCESS RIGHT  10/29/2018   KNEE ARTHROPLASTY Right 09/19/2021   Procedure: COMPUTER ASSISTED TOTAL KNEE ARTHROPLASTY;  Surgeon: Mardee Lynwood SQUIBB, MD;  Location: [facility];  Service: Orthopedics;  Laterality: Right;   KNEE ARTHROSCOPY Bilateral    POLYPECTOMY  08/22/2017   Procedure: POLYPECTOMY;  Surgeon: Jinny Carmine, MD;  Location: [facility];  Service: Gastroenterology;;   RIGHT/LEFT HEART CATH AND CORONARY ANGIOGRAPHY N/A 06/13/2017   Procedure: Right/Left Heart Cath and Coronary Angiography;  Surgeon: Mady Bruckner, MD;  Location: [facility];  Service:  Cardiovascular;  Laterality: N/A;   TONSILLECTOMY     Social History   Socioeconomic History   Marital status: Widowed    Spouse name: Rilla   Number of children: 2   Years of education: Not on file   Highest education level: 7th grade  Occupational History   Occupation: Retired  Tobacco Use   Smoking status: Former    Current packs/day: 0.00    Average packs/day: 1.5 packs/day for 20.0 years (30.0 ttl pk-yrs)    Types: Cigarettes    Start date: 22    Quit date: 23    Years since quitting: 38.5   Smokeless tobacco: Former    Types: Chew    Quit date: 2000   Tobacco comments:    smoking cessation materials not required  Vaping Use   Vaping status: Never Used  Substance and Sexual Activity   Alcohol  use: No    Alcohol /week: 0.0 standard drinks of alcohol    Drug use: No   Sexual activity: Never  Other Topics Concern   Not on file  Social History Narrative   Not on file   Social Drivers of Health   Financial Resource Strain: Low Risk  (09/05/2023)   Overall Financial Resource Strain (CARDIA)    Difficulty of Paying Living Expenses: Not hard at all  Food Insecurity: No Food Insecurity (09/05/2023)   Hunger Vital Sign    Worried About Running Out of Food in the Last Year: Never true    Ran Out of Food in the Last Year: Never true  Transportation Needs: No Transportation Needs (09/05/2023)   PRAPARE - Administrator, Civil Service (Medical): No    Lack of Transportation (Non-Medical): No  Physical Activity: Sufficiently Active (09/05/2023)   Exercise Vital Sign    Days of Exercise per Week: 3 days    Minutes of Exercise per Session: 60 min  Stress: No Stress Concern Present (09/05/2023)   [employer] of Occupational Health - Occupational Stress Questionnaire  Feeling of Stress : Not at all  Social Connections: Moderately Isolated (09/05/2023)   Social Connection and Isolation Panel    Frequency of Communication with Friends and Family: Three  times a week    Frequency of Social Gatherings with Friends and Family: Once a week    Attends Religious Services: More than 4 times per year    Active Member of [employer] or Organizations: No    Attends Banker Meetings: Never    Marital Status: Widowed  Intimate Partner Violence: Not At Risk (09/05/2023)   Humiliation, Afraid, Rape, and Kick questionnaire    Fear of Current or Ex-Partner: No    Emotionally Abused: No    Physically Abused: No    Sexually Abused: No   Family History  Problem Relation Age of Onset   Alzheimer's disease Mother    Pulmonary embolism Father    Diabetes Father    Heart attack Father 74   Diabetes Brother    Heart disease Brother        CABG   Heart disease Brother        CABG   Diabetes Brother    Diabetes Brother    Healthy Brother    Current Outpatient Medications on File Prior to Visit  Medication Sig   apixaban  (ELIQUIS ) 5 MG TABS tablet Take 1 tablet (5 mg total) by mouth 2 (two) times daily.   atorvastatin  (LIPITOR) 80 MG tablet Take 1 tablet (80 mg total) by mouth daily.   B COMPLEX VITAMINS PO Take 1 tablet by mouth daily.    calcium  carbonate (TUMS - DOSED IN MG ELEMENTAL CALCIUM ) 500 MG chewable tablet Chew 1 tablet by mouth as needed for indigestion or heartburn.   Carboxymethylcellul-Glycerin (LUBRICATING EYE DROPS OP) Apply 1 drop to eye as needed (dry eyes).    cholecalciferol  (VITAMIN D3) 25 MCG (1000 UNIT) tablet Take 1,000 Units by mouth daily.   COLLAGEN PO Take 1 Scoop by mouth daily.   dapagliflozin  propanediol (FARXIGA ) 10 MG TABS tablet Take 1 tablet (10 mg total) by mouth daily before breakfast.   diclofenac  sodium (VOLTAREN ) 1 % GEL Apply 4 g topically 4 (four) times daily. To the knee if needed   doxycycline  (PERIOSTAT ) 20 MG tablet Take 1 tablet (20 mg total) by mouth 2 (two) times daily. Take with food and drink   fluticasone  (FLONASE ) 50 MCG/ACT nasal spray Place 1 spray into both nostrils daily as needed  (congestion).   lisinopril  (ZESTRIL ) 2.5 MG tablet Take 1 tablet (2.5 mg total) by mouth daily.   metroNIDAZOLE  (METROGEL ) 0.75 % gel Apply to nose and cheeks every night for rosacea.   Potassium 99 MG TABS Take 99 mg by mouth daily. Leg cramps   Tetrahydroz-Dextran-PEG-Povid (EYE DROPS ADVANCED RELIEF OP) Apply 1 drop to eye as needed.   furosemide  (LASIX ) 20 MG tablet TAKE 1 TABLET EVERY DAY AS NEEDED (Patient not taking: Reported on 06/16/2024)   No current facility-administered medications on file prior to visit.    Review of Systems  Constitutional:  Negative for activity change, appetite change, chills, diaphoresis, fatigue and fever.  HENT:  Negative for congestion and hearing loss.   Eyes:  Negative for visual disturbance.  Respiratory:  Negative for cough, chest tightness, shortness of breath and wheezing.   Cardiovascular:  Negative for chest pain, palpitations and leg swelling.  Gastrointestinal:  Negative for abdominal pain, constipation, diarrhea, nausea and vomiting.  Genitourinary:  Negative for dysuria, frequency and hematuria.  Musculoskeletal:  Negative for arthralgias and neck pain.  Skin:  Negative for rash.  Neurological:  Negative for dizziness, weakness, light-headedness, numbness and headaches.  Hematological:  Negative for adenopathy.  Psychiatric/Behavioral:  Negative for behavioral problems, dysphoric mood and sleep disturbance.    Per HPI unless specifically indicated above     Objective:    BP 98/60 (BP Location: Right Arm, Patient Position: Sitting, Cuff Size: Normal)   Pulse (!) 55   Ht 6' 1 (1.854 m)   Wt 190 lb 9.6 oz (86.5 kg)   SpO2 97%   BMI 25.15 kg/m   Wt Readings from Last 3 Encounters:  06/16/24 190 lb 9.6 oz (86.5 kg)  06/03/24 192 lb 12.8 oz (87.5 kg)  02/25/24 198 lb 9.6 oz (90.1 kg)    Physical Exam Vitals and nursing note reviewed.  Constitutional:      General: He is not in acute distress.    Appearance: He is well-developed.  He is not diaphoretic.     Comments: Well-appearing, comfortable, cooperative  HENT:     Head: Normocephalic and atraumatic.  Eyes:     General:        Right eye: No discharge.        Left eye: No discharge.     Conjunctiva/sclera: Conjunctivae normal.     Pupils: Pupils are equal, round, and reactive to light.  Neck:     Thyroid : No thyromegaly.     Vascular: No carotid bruit.  Cardiovascular:     Rate and Rhythm: Normal rate and regular rhythm.     Pulses: Normal pulses.     Heart sounds: Normal heart sounds. No murmur heard. Pulmonary:     Effort: Pulmonary effort is normal. No respiratory distress.     Breath sounds: Normal breath sounds. No wheezing or rales.  Abdominal:     General: Bowel sounds are normal. There is no distension.     Palpations: Abdomen is soft. There is no mass.     Tenderness: There is no abdominal tenderness.  Musculoskeletal:        General: No tenderness. Normal range of motion.     Cervical back: Normal range of motion and neck supple.     Right lower leg: No edema.     Left lower leg: No edema.     Comments: Upper / Lower Extremities: - Normal muscle tone, strength bilateral upper extremities 5/5, lower extremities 5/5  Lymphadenopathy:     Cervical: No cervical adenopathy.  Skin:    General: Skin is warm and dry.     Findings: No erythema or rash.  Neurological:     Mental Status: He is alert and oriented to person, place, and time.     Comments: Distal sensation intact to light touch all extremities  Psychiatric:        Mood and Affect: Mood normal.        Behavior: Behavior normal.        Thought Content: Thought content normal.     Comments: Well groomed, good eye contact, normal speech and thoughts     Results for orders placed or performed in visit on 06/09/24  TSH   Collection Time: 06/09/24  8:06 AM  Result Value Ref Range   TSH 3.90 0.40 - 4.50 mIU/L  PSA   Collection Time: 06/09/24  8:06 AM  Result Value Ref Range   PSA  0.85 < OR = 4.00 ng/mL  COMPLETE METABOLIC PANEL WITH GFR   Collection Time: 06/09/24  8:06 AM  Result Value Ref Range   Glucose, Bld 139 65 - 139 mg/dL   BUN 24 7 - 25 mg/dL   Creat 8.34 (H) 9.29 - 1.22 mg/dL   BUN/Creatinine Ratio 15 6 - 22 (calc)   Sodium 139 135 - 146 mmol/L   Potassium 4.5 3.5 - 5.3 mmol/L   Chloride 103 98 - 110 mmol/L   CO2 28 20 - 32 mmol/L   Calcium  9.1 8.6 - 10.3 mg/dL   Total Protein 6.3 6.1 - 8.1 g/dL   Albumin  4.0 3.6 - 5.1 g/dL   Globulin 2.3 1.9 - 3.7 g/dL (calc)   AG Ratio 1.7 1.0 - 2.5 (calc)   Total Bilirubin 1.8 (H) 0.2 - 1.2 mg/dL   Alkaline phosphatase (APISO) 61 35 - 144 U/L   AST 28 10 - 35 U/L   ALT 24 9 - 46 U/L  CBC with Differential/Platelet   Collection Time: 06/09/24  8:06 AM  Result Value Ref Range   WBC 9.1 3.8 - 10.8 Thousand/uL   RBC 4.31 4.20 - 5.80 Million/uL   Hemoglobin 13.2 13.2 - 17.1 g/dL   HCT 58.7 61.4 - 49.9 %   MCV 95.6 80.0 - 100.0 fL   MCH 30.6 27.0 - 33.0 pg   MCHC 32.0 32.0 - 36.0 g/dL   RDW 87.3 88.9 - 84.9 %   Platelets 180 140 - 400 Thousand/uL   MPV 10.4 7.5 - 12.5 fL   Neutro Abs 6,443 1,500 - 7,800 cells/uL   Absolute Lymphocytes 1,875 850 - 3,900 cells/uL   Absolute Monocytes 564 200 - 950 cells/uL   Eosinophils Absolute 137 15 - 500 cells/uL   Basophils Absolute 82 0 - 200 cells/uL   Neutrophils Relative % 70.8 %   Total Lymphocyte 20.6 %   Monocytes Relative 6.2 %   Eosinophils Relative 1.5 %   Basophils Relative 0.9 %  Lipid panel   Collection Time: 06/09/24  8:06 AM  Result Value Ref Range   Cholesterol 101 <200 mg/dL   HDL 45 > OR = 40 mg/dL   Triglycerides 60 <849 mg/dL   LDL Cholesterol (Calc) 42 mg/dL (calc)   Total CHOL/HDL Ratio 2.2 <5.0 (calc)   Non-HDL Cholesterol (Calc) 56 <869 mg/dL (calc)  Hemoglobin J8r   Collection Time: 06/09/24  8:06 AM  Result Value Ref Range   Hgb A1c MFr Bld 5.9 (H) <5.7 %   Mean Plasma Glucose 123 mg/dL   eAG (mmol/L) 6.8 mmol/L      Assessment &  Plan:   Problem List Items Addressed This Visit     Dyslipidemia   Heart failure with reduced ejection fraction (HCC)   PAF (paroxysmal atrial fibrillation) (HCC)   Pre-diabetes   Relevant Medications   glucose blood test strip   Other Visit Diagnoses       Annual physical exam    -  Primary     Need for Streptococcus pneumoniae vaccination       Relevant Orders   Pneumococcal conjugate vaccine 20-valent (Completed)        Updated Health Maintenance information Reviewed recent lab results with patient Encouraged improvement to lifestyle with diet and exercise Goal of weight loss  Functional Bowel / Disordered Bowel Movements Increased frequency, loose stools, and mucus possibly linked to collagen supplement. Symptoms functional, no major disease concern. - Recommend Metamucil for stool bulking fiber. - Consider probiotics for gas and bloating.  Throat Irritation Intermittent irritation possibly due to lisinopril  or acid  reflux. Lisinopril  beneficial for heart health but may cause throat irritation. Seems he was on med for years then off, and it was recently restarted. Question if this is triggering throat symptoms. It is very low dose 2.5mg . Otherwise would pursue GERD therapy options maybe for more silent reflux - Discuss with cardiologist about pausing lisinopril . - Consider acid treatment if symptoms persist.  Heart Failure with Reduced Ejection Fraction Managed with lisinopril  and diuretic. Echocardiogram shows reduced function, diuretic reduces fluid. - Continue current management under cardiologist. - Follow up on heart monitor results.  Chronic Kidney Disease III Slight decline in function, possibly due to dehydration, not a major concern.  Prediabetes Blood sugar at 5.9, mild prediabetes, well-managed. - Continue management and monitor blood sugar.  Hyperlipidemia Cholesterol controlled with atorvastatin , LDL at 42. - Continue atorvastatin  80 mg.  General  Health Maintenance Up to date on most vaccinations. Recommends pneumonia and RSV vaccines. Weight fluctuation possibly due to diet. - Administer pneumonia vaccine. - Consider RSV vaccine in fall. - Encourage dietary fiber intake.  Follow-up - Schedule follow-up in six months, early in the day.       Orders Placed This Encounter  Procedures   Pneumococcal conjugate vaccine 20-valent    Meds ordered this encounter  Medications   glucose blood test strip    Sig: Use to check blood sugar as needed max of one time daily    Dispense:  100 each    Refill:  12     Follow up plan: Return for 6 month PreDM A1c.  Marsa Officer, DO [facility] 06/16/2024, 8:33 AM

## 2024-06-17 DIAGNOSIS — R001 Bradycardia, unspecified: Secondary | ICD-10-CM | POA: Diagnosis not present

## 2024-06-29 ENCOUNTER — Other Ambulatory Visit: Payer: Self-pay | Admitting: Family Medicine

## 2024-06-29 ENCOUNTER — Other Ambulatory Visit: Payer: Self-pay

## 2024-06-29 DIAGNOSIS — R001 Bradycardia, unspecified: Secondary | ICD-10-CM

## 2024-06-29 DIAGNOSIS — J029 Acute pharyngitis, unspecified: Secondary | ICD-10-CM

## 2024-06-30 ENCOUNTER — Other Ambulatory Visit: Payer: Self-pay

## 2024-06-30 ENCOUNTER — Ambulatory Visit: Payer: Self-pay | Admitting: Medical

## 2024-07-01 NOTE — Telephone Encounter (Signed)
 Patient is returning call.

## 2024-07-05 ENCOUNTER — Other Ambulatory Visit: Payer: Self-pay

## 2024-07-06 ENCOUNTER — Other Ambulatory Visit: Payer: Self-pay

## 2024-07-06 ENCOUNTER — Ambulatory Visit: Attending: Medical | Admitting: Medical

## 2024-07-06 ENCOUNTER — Encounter: Payer: Self-pay | Admitting: Medical

## 2024-07-06 ENCOUNTER — Other Ambulatory Visit: Payer: Self-pay | Admitting: Family Medicine

## 2024-07-06 VITALS — BP 106/62 | HR 55 | Ht 73.0 in | Wt 189.2 lb

## 2024-07-06 DIAGNOSIS — I480 Paroxysmal atrial fibrillation: Secondary | ICD-10-CM

## 2024-07-06 DIAGNOSIS — I5022 Chronic systolic (congestive) heart failure: Secondary | ICD-10-CM | POA: Diagnosis not present

## 2024-07-06 DIAGNOSIS — R001 Bradycardia, unspecified: Secondary | ICD-10-CM | POA: Diagnosis not present

## 2024-07-06 DIAGNOSIS — I2510 Atherosclerotic heart disease of native coronary artery without angina pectoris: Secondary | ICD-10-CM

## 2024-07-06 DIAGNOSIS — I712 Thoracic aortic aneurysm, without rupture, unspecified: Secondary | ICD-10-CM

## 2024-07-06 DIAGNOSIS — Z792 Long term (current) use of antibiotics: Secondary | ICD-10-CM

## 2024-07-06 MED ORDER — AMOXICILLIN 500 MG PO CAPS
2000.0000 mg | ORAL_CAPSULE | Freq: Once | ORAL | 0 refills | Status: AC
  Filled 2024-07-06: qty 4, 1d supply, fill #0

## 2024-07-06 MED ORDER — AMOXICILLIN 500 MG PO CAPS
2000.0000 mg | ORAL_CAPSULE | ORAL | 2 refills | Status: DC | PRN
  Filled 2024-07-09: qty 4, 1d supply, fill #0
  Filled 2024-07-19: qty 4, 1d supply, fill #1
  Filled 2024-08-18: qty 4, 1d supply, fill #2

## 2024-07-06 NOTE — Progress Notes (Signed)
 Cardiology Office Note   Date:  07/06/2024  ID:  Anthony Mosley, DOB March 12, 1935, MRN [id number] PCP: Edman Marsa PARAS, DO  [facility] Providers Cardiologist:  Lonni Hanson, MD   History of Present Illness Anthony Mosley is a 89 y.o. male with a history of nonobstructive coronary artery disease by LHC 2019, chronic HFrEF with partially recovered LVEF, MAP versus PAF, pulmonary hypertension, thoracic aortic aneurysm, mild to moderate MR, hyperlipidemia, and CKD stage III who presents for follow-up of heart failure.   Most recent echo showed decline in EF of 35 to 40%.  Prior EF was 45 to 50%.  Patient was last seen 02/25/2024 reported stable dyspnea on exertion and dependent edema.  Echo images reviewed by MD and felt to be 40 to 45%.  GDMT was started with lisinopril .  No beta-blocker due to resting bradycardia.  Patient was last seen 06/03/2024 and is overall doing well.  EKG showed sinus bradycardia with a heart rate of 48 BPM.  1 week heart monitor was ordered.  Heart monitor showed predominantly normal sinus rhythm with an average heart rate of 58 bpm, occasional PACs, rare PVCs, 20 runs of SVT lasting up to 10 beats, no sustained arrhythmia or prolonged pause.  Today, the patient is overall doing well. He occasionally notices low heart rates in the morning. When he exercises or does activity heart rate goes up to the 80s. He denies chest pain, SOB, lower leg edema, lightheadedness or dizziness.    Studies Reviewed EKG Interpretation Date/Time:  Tuesday July 06 2024 11:33:31 EDT Ventricular Rate:  55 PR Interval:  174 QRS Duration:  152 QT Interval:  510 QTC Calculation: 487 R Axis:   -87  Text Interpretation: Sinus bradycardia Right bundle Mosley block Left anterior fascicular block Bifascicular block Anterior infarct , age undetermined When compared with ECG of 03-Jun-2024 11:23, Nonspecific T wave abnormality now evident in Lateral leads Confirmed by  Franchester, Creston Klas ([id number]) on 07/06/2024 11:50:37 AM    Heart monitor 06/2024    The patient was monitored for 7 days.   The predominant rhythm was sinus with an average rate of 58 bpm (range 37-119 bpm in sinus).   There were occasional PACs (1.5%) and rare PVCs.   20 supraventricular runs occurred, lasting up to 10 beats with a maximum rate of 141 bpm.   No sustained arrhythmia or prolonged pause was observed.   There were no patient triggered events.   Predominantly sinus rhythm with occasional PACs and several brief supraventricular runs, as detailed above.  Echo 02/2024 1. Left ventricular ejection fraction, by estimation, is 35 to 40%. Left  ventricular ejection fraction by 3D volume is 46 %. The left ventricle has  moderately decreased function. The left ventricle demonstrates global  hypokinesis. The left ventricular  internal cavity size was mildly dilated. Left ventricular diastolic  parameters are consistent with Grade I diastolic dysfunction (impaired  relaxation). The average left ventricular global longitudinal strain is  -16.9 %. The global longitudinal strain is  normal.   2. Right ventricular systolic function is normal. The right ventricular  size is normal. There is normal pulmonary artery systolic pressure. The  estimated right ventricular systolic pressure is 27.5 mmHg.   3. Left atrial size was moderately dilated.   4. The mitral valve is normal in structure. Mild to moderate mitral valve  regurgitation. No evidence of mitral stenosis.   5. The aortic valve is tricuspid. Aortic valve regurgitation is moderate.  Aortic valve sclerosis  is present, with no evidence of aortic valve  stenosis.   6. There is mild dilatation of the aortic root, measuring 43 mm.   7. The inferior vena cava is normal in size with greater than 50%  respiratory variability, suggesting right atrial pressure of 3 mmHg.    Echo 2021  1. Left ventricular ejection fraction, by estimation, is 45 to  50%. The  left ventricle has mildly decreased function. The left ventricle  demonstrates global hypokinesis. There is mild left ventricular  hypertrophy. Left ventricular diastolic parameters  are indeterminate.   2. Right ventricular systolic function is normal. The right ventricular  size is normal.   3. Right atrial size was mildly dilated.   4. The mitral valve is normal in structure. Mild mitral valve  regurgitation.   5. The aortic valve is tricuspid. Aortic valve regurgitation is mild.   6. Aortic dilatation noted. There is mild dilatation of the aortic root,  measuring 40 mm. There is moderate dilatation of the ascending aorta,  measuring 44 mm.    Heart monitor 2019  The patient was monitored for 13 days, 23 hours. The predominant rhythm was sinus with an average rate of 75 bpm (range 48 to 202 bpm). Patient with PACs and PVCs occurred. Frequent irregularly irregular rhythm was observed. Several strips are suspicious for atrial fibrillation, though multifocal atrial tachycardia cannot be excluded. The longest episode lasted 40 minutes, 45 seconds. Atrial fibrillation burden ~30%. No sustained ventricular arrhythmia or prolonged pause was identified. Patient triggered event corresponds to atrial fibrillation.   Predominantly sinus rhythm with likely atrial fibrillation (30% burden), though episodes of multifocal atrial tachycardia cannot be excluded.  Physical Exam VS:  BP 106/62   Pulse (!) 55   Ht 6' 1 (1.854 m)   Wt 189 lb 3.2 oz (85.8 kg)   SpO2 94%   BMI 24.96 kg/m        Wt Readings from Last 3 Encounters:  07/06/24 189 lb 3.2 oz (85.8 kg)  06/16/24 190 lb 9.6 oz (86.5 kg)  06/03/24 192 lb 12.8 oz (87.5 kg)    GEN: Well nourished, well developed in no acute distress NECK: No JVD; No carotid bruits CARDIAC: RRR, no murmurs, rubs, gallops RESPIRATORY:  Clear to auscultation without rales, wheezing or rhonchi  ABDOMEN: Soft, non-tender,  non-distended EXTREMITIES:  No edema; No deformity   ASSESSMENT AND PLAN  Chronic HFrEF NICM Most recent echo showed reduced LVEF 35-40%, though to MD it appeared to show 40-45%. He is euvolemic on exam. Continue lisinopril  2.5mg  daily and Farxiga  10 mg daily. BP is limiting GDMT. Repeat limited echo in 1 month.   Sinus bradycardia Heart monitor showed average hear rate of 58bpm. EKG today shows SB with a heart rate of 55bpm. When he exercises or walks heart rate appropriately rises. He denies any symptoms. He is not on rate lowering medication.  Paroxysmal Afib In sinus bradycardia on EKG. Continue Eliquis  5mg  BID for stroke ppx. Not on rate controlling medication at baseline.   Nonobstructive CAD The patient denies anginal symptoms. No ASA given Eliquis .   Thoracic aortic aneurysm Ascending aorta measured at 4.3cm. We will continue to monitor annually.        Dispo: Follow-up in 3 months  Signed, Pranish Akhavan VEAR Fishman, PA-C

## 2024-07-06 NOTE — Patient Instructions (Signed)
 Medication Instructions:  Your physician recommends that you continue on your current medications as directed. Please refer to the Current Medication list given to you today.   *If you need a refill on your cardiac medications before your next appointment, please call your pharmacy*  Lab Work: No labs ordered today  If you have labs (blood work) drawn today and your tests are completely normal, you will receive your results only by: MyChart Message (if you have MyChart) OR A paper copy in the mail If you have any lab test that is abnormal or we need to change your treatment, we will call you to review the results.  Testing/Procedures: Your physician has requested that you have an limited echocardiogram. Echocardiography is a painless test that uses sound waves to create images of your heart. It provides your doctor with information about the size and shape of your heart and how well your heart's chambers and valves are working.   You may receive an ultrasound enhancing agent through an IV if needed to better visualize your heart during the echo. This procedure takes approximately one hour.  There are no restrictions for this procedure.  This will take place at [address] [facility] [address], [place] [address]  Please note: We ask at that you not bring children with you during ultrasound (echo/ vascular) testing. Due to room size and safety concerns, children are not allowed in the ultrasound rooms during exams. Our front office staff cannot provide observation of children in our lobby area while testing is being conducted. An adult accompanying a patient to their appointment will only be allowed in the ultrasound room at the discretion of the ultrasound technician under special circumstances. We apologize for any inconvenience.   Follow-Up: At [facility], you and your health needs are our priority.  As part of our continuing mission to provide you with exceptional  heart care, our providers are all part of one team.  This team includes your primary Cardiologist (physician) and Advanced Practice Providers or APPs (Physician Assistants and Nurse Practitioners) who all work together to provide you with the care you need, when you need it.  Your next appointment:   3 month(s)  Provider:   Lonni Hanson, MD or Cadence Franchester, PA-C

## 2024-07-09 ENCOUNTER — Other Ambulatory Visit: Payer: Self-pay

## 2024-07-12 ENCOUNTER — Other Ambulatory Visit: Payer: Self-pay

## 2024-07-19 ENCOUNTER — Other Ambulatory Visit: Payer: Self-pay

## 2024-07-19 DIAGNOSIS — H02059 Trichiasis without entropian unspecified eye, unspecified eyelid: Secondary | ICD-10-CM | POA: Diagnosis not present

## 2024-07-29 ENCOUNTER — Other Ambulatory Visit: Payer: Self-pay

## 2024-08-18 ENCOUNTER — Other Ambulatory Visit: Payer: Self-pay | Admitting: Internal Medicine

## 2024-08-18 ENCOUNTER — Other Ambulatory Visit: Payer: Self-pay

## 2024-08-18 DIAGNOSIS — I480 Paroxysmal atrial fibrillation: Secondary | ICD-10-CM

## 2024-08-18 NOTE — Telephone Encounter (Signed)
 DOSE BEING EVALUATED BY PHARMD BEFORE SENDING  Eliquis  5mg  refill request received. Patient is 89 years old, weight-85.8kg, Crea-1.65 on 06/09/24, Diagnosis-Afib, and last seen by Cadence Furth on 07/06/24. Dose is appropriate based on dosing criteria. Will send to PharmD to evaluate.   Previous Creatinine levels: 06/09/24-1.65 03/11/24-1.46 01/29/24-1.52 05/27/23-1.43 02/13/23-1.41

## 2024-08-18 NOTE — Telephone Encounter (Signed)
 Lets get a repeat BMP on him to see if this was an one off

## 2024-08-18 NOTE — Telephone Encounter (Signed)
 Eliquis  5mg  refill request received. Patient is 89 years old, weight-85.8kg, Crea-1.65 on 06/09/24, Diagnosis-Afib, and last seen by Cadence Furth on 07/06/24. Dose is appropriate based on dosing criteria. Will send to PharmD to evaluate.  Previous Creatinine levels: 06/09/24-1.65 03/11/24-1.46 01/29/24-1.52 05/27/23-1.43 02/13/23-1.41

## 2024-08-19 ENCOUNTER — Other Ambulatory Visit: Payer: Self-pay

## 2024-08-19 ENCOUNTER — Ambulatory Visit: Attending: Medical

## 2024-08-19 DIAGNOSIS — I5022 Chronic systolic (congestive) heart failure: Secondary | ICD-10-CM

## 2024-08-19 LAB — ECHOCARDIOGRAM LIMITED
AV Mean grad: 3 mmHg
AV Peak grad: 5.4 mmHg
Ao pk vel: 1.16 m/s
Area-P 1/2: 2.2 cm2
S' Lateral: 4.46 cm

## 2024-08-20 ENCOUNTER — Other Ambulatory Visit: Payer: Self-pay

## 2024-08-20 ENCOUNTER — Ambulatory Visit: Payer: Self-pay | Admitting: Medical

## 2024-08-21 ENCOUNTER — Other Ambulatory Visit: Payer: Self-pay

## 2024-08-22 ENCOUNTER — Other Ambulatory Visit: Payer: Self-pay

## 2024-08-23 ENCOUNTER — Other Ambulatory Visit: Payer: Self-pay | Admitting: Medical

## 2024-08-23 ENCOUNTER — Other Ambulatory Visit: Payer: Self-pay

## 2024-08-23 DIAGNOSIS — I480 Paroxysmal atrial fibrillation: Secondary | ICD-10-CM

## 2024-08-24 ENCOUNTER — Other Ambulatory Visit: Payer: Self-pay

## 2024-08-24 ENCOUNTER — Ambulatory Visit: Payer: Self-pay | Admitting: Internal Medicine

## 2024-08-24 LAB — BASIC METABOLIC PANEL WITH GFR
BUN/Creatinine Ratio: 18 (ref 10–24)
BUN: 24 mg/dL (ref 8–27)
CO2: 24 mmol/L (ref 20–29)
Calcium: 9.3 mg/dL (ref 8.6–10.2)
Chloride: 104 mmol/L (ref 96–106)
Creatinine, Ser: 1.36 mg/dL — ABNORMAL HIGH (ref 0.76–1.27)
Glucose: 83 mg/dL (ref 70–99)
Potassium: 4.6 mmol/L (ref 3.5–5.2)
Sodium: 140 mmol/L (ref 134–144)
eGFR: 51 mL/min/1.73 — ABNORMAL LOW (ref >59)

## 2024-08-24 MED FILL — Apixaban Tab 5 MG: ORAL | 100 days supply | Qty: 200 | Fill #0 | Status: AC

## 2024-08-24 NOTE — Telephone Encounter (Signed)
 Duplicate; sent this refill today on a previous request.

## 2024-08-24 NOTE — Telephone Encounter (Signed)
 Labs received Crea-1.36 on 08/23/24; will continue eliquis  5mg  bid; confirmed with Melissa PharmD since recommended labs be repeated before sending.   Eliquis  5mg  refill request received. Patient is 89 years old, weight-85.8kg, Crea-1.36 on 08/23/24, Diagnosis-Afib, and last seen by Cadence Furth on 07/06/24. Dose is appropriate based on dosing criteria

## 2024-08-25 ENCOUNTER — Other Ambulatory Visit: Payer: Self-pay

## 2024-08-26 DIAGNOSIS — D485 Neoplasm of uncertain behavior of skin: Secondary | ICD-10-CM | POA: Diagnosis not present

## 2024-08-26 DIAGNOSIS — H02059 Trichiasis without entropian unspecified eye, unspecified eyelid: Secondary | ICD-10-CM | POA: Diagnosis not present

## 2024-09-07 ENCOUNTER — Other Ambulatory Visit: Payer: Self-pay

## 2024-09-07 MED ORDER — AMOXICILLIN 500 MG PO CAPS
2000.0000 mg | ORAL_CAPSULE | ORAL | 2 refills | Status: AC
  Filled 2024-09-07 – 2024-09-20 (×2): qty 4, 1d supply, fill #0

## 2024-09-10 ENCOUNTER — Ambulatory Visit: Payer: PPO

## 2024-09-17 ENCOUNTER — Other Ambulatory Visit: Payer: Self-pay

## 2024-09-20 ENCOUNTER — Other Ambulatory Visit: Payer: Self-pay

## 2024-09-27 ENCOUNTER — Other Ambulatory Visit: Payer: Self-pay

## 2024-09-30 DIAGNOSIS — Z96651 Presence of right artificial knee joint: Secondary | ICD-10-CM | POA: Diagnosis not present

## 2024-10-07 ENCOUNTER — Ambulatory Visit: Attending: Internal Medicine | Admitting: Internal Medicine

## 2024-10-07 ENCOUNTER — Encounter: Payer: Self-pay | Admitting: Internal Medicine

## 2024-10-07 VITALS — BP 108/60 | HR 57 | Ht 73.0 in | Wt 194.4 lb

## 2024-10-07 DIAGNOSIS — I480 Paroxysmal atrial fibrillation: Secondary | ICD-10-CM | POA: Diagnosis not present

## 2024-10-07 DIAGNOSIS — I2510 Atherosclerotic heart disease of native coronary artery without angina pectoris: Secondary | ICD-10-CM | POA: Diagnosis not present

## 2024-10-07 DIAGNOSIS — I5022 Chronic systolic (congestive) heart failure: Secondary | ICD-10-CM

## 2024-10-07 DIAGNOSIS — I712 Thoracic aortic aneurysm, without rupture, unspecified: Secondary | ICD-10-CM

## 2024-10-07 DIAGNOSIS — I428 Other cardiomyopathies: Secondary | ICD-10-CM | POA: Diagnosis not present

## 2024-10-07 NOTE — Patient Instructions (Signed)

## 2024-10-07 NOTE — Progress Notes (Signed)
 Cardiology Office Note:  .   Date:  10/07/2024  ID:  Anthony Mosley, DOB 1935/07/07, MRN [id number] PCP: Anthony Marsa PARAS, DO  [facility] Providers Cardiologist:  Anthony Hanson, MD     History of Present Illness: .   Anthony Mosley is a 89 y.o. male with history of nonobstructive coronary artery disease, chronic HFrEF with partially recovered LVEF, MAT versus PAF, pulmonary hypertension, thoracic aortic aneurysm, mild to moderate mitral regurgitation, hyperlipidemia, and chronic kidney disease stage 3, who presents for follow-up of heart failure.  He was last seen in our office in July by Anthony Mosley, Anthony Mosley, at which time he was feeling fairly well.  He still noted occasional low heart rates in the mornings, though his heart rate would respond appropriately to activity.  Preceding event monitor in June showed predominantly sinus rhythm with average heart rate of 58 bpm, occasional PACs, rare PVCs, and 20 supraventricular runs lasting up to 10 beats.  Limited echocardiogram was ordered at his July visit, which showed stable LVEF of 35-40% with moderate LV dilation as well as mildly reduced RV systolic function, mild-moderate MR, mild-moderate TR, and moderate AI.  Today, Anthony Mosley reports that he has been feeling fairly well, denying chest pain, shortness of breath, palpitations, and lightheadedness.  He has longstanding dependent edema, which is unchanged.  He has tried using compression stockings in the past but has struggled with putting them on and taking them off.  He saw Anthony Mosley few years ago; they agreed to defer invasive treatments.  Anthony Mosley remains active at home, working in his yard and also going to the gym.  He notes a fall about 2 weeks ago, having tripped on uneven portion of his son's driveway.  He did not sustain any significant injuries.  He denies bleeding, remaining compliant with apixaban .  ROS: See HPI  Studies Reviewed: Anthony Mosley   EKG  Interpretation Date/Time:  Thursday October 07 2024 08:05:03 EDT Ventricular Rate:  57 PR Interval:  190 QRS Duration:  156 QT Interval:  512 QTC Calculation: 498 R Axis:   -82  Text Interpretation: Sinus bradycardia with sinus arrhythmia with occasional Premature ventricular complexes Right bundle branch block Left anterior fascicular block Bifascicular block Anterior infarct (cited on or before 06-Jul-2024) Abnormal ECG When compared with ECG of 06-Jul-2024 11:33, Premature ventricular complexes are now Present Otherwise no significant change Confirmed by Anthony Mosley, Anthony [phone]) on 10/07/2024 8:08:26 AM    Limited TTE (08/19/2024): Moderately dilated left ventricle with LVEF 35-40%, global hypokinesis, grade 1 diastolic dysfunction, and global longitudinal strain of -16.7%.  Normal RV size with mildly reduced function.  Normal CVP.  Normal biatrial size.  No pericardial effusion.  Mild-moderate MR.  Mild-moderate TR, moderate AI.  Risk Assessment/Calculations:    CHA2DS2-VASc Score = 5   This indicates a 7.2% annual risk of stroke. The patient's score is based upon: CHF History: 1 HTN History: 1 Diabetes History: 0 Stroke History: 0 Vascular Disease History: 1 Age Score: 2 Gender Score: 0            Physical Exam:   VS:  BP 108/60   Pulse (!) 57   Ht 6' 1 (1.854 m)   Wt 194 lb 6.4 oz (88.2 kg)   SpO2 98%   BMI 25.65 kg/m    Wt Readings from Last 3 Encounters:  10/07/24 194 lb 6.4 oz (88.2 kg)  07/06/24 189 lb 3.2 oz (85.8 kg)  06/16/24 190 lb 9.6 oz (86.5  kg)    General:  NAD. Neck: No JVD or HJR. Lungs: Clear to auscultation bilaterally without wheezes or crackles. Heart: Bradycardic but regular rhythm without murmurs, rubs, or gallops. Abdomen: Soft, nontender, nondistended. Extremities: Trace pretibial edema bilaterally  ASSESSMENT AND PLAN: .    Chronic heart failure with mildly reduced ejection fraction due to nonischemic cardiomyopathy: [name] appears  fairly euvolemic other than chronic trace pretibial edema that is likely driven in the large part by venous insufficiency.  He is asymptomatic consistent with NYHA class I heart failure.  Recent echo showed stable LVEF of 40-45% as well as mild-moderate MR/TR and moderate AI.  We will continue his current medications, including as needed furosemide .  Soft blood pressure precludes further escalation of his lisinopril .  I am reluctant to challenge him with a beta-blocker in the setting of his resting bradycardia.  Paroxysmal atrial fibrillation: No evidence of recurrent atrial fibrillation.  Defer AV nodal blocking agents at this time given baseline bradycardia.  Continue apixaban  5 mg twice daily.  Thoracic aortic aneurysm: Mildly-moderately dilated thoracic aorta previously noted.  Per my measurement on echocardiogram in 08/2024, ascending aorta is stable in size around 4.4 cm.  Continue blood pressure control and avoidance of fluoroquinolones if possible.  Nonobstructive coronary artery disease: No angina reported.  Continue secondary prevention with atorvastatin  and apixaban  and lieu of aspirin  given history of PAF.    Dispo: Return to clinic in 6 months.  Signed, Anthony Hanson, MD

## 2024-10-11 ENCOUNTER — Ambulatory Visit: Admitting: Dermatology

## 2024-10-11 DIAGNOSIS — Z7189 Other specified counseling: Secondary | ICD-10-CM

## 2024-10-11 DIAGNOSIS — L821 Other seborrheic keratosis: Secondary | ICD-10-CM | POA: Diagnosis not present

## 2024-10-11 DIAGNOSIS — Z86007 Personal history of in-situ neoplasm of skin: Secondary | ICD-10-CM

## 2024-10-11 DIAGNOSIS — W908XXA Exposure to other nonionizing radiation, initial encounter: Secondary | ICD-10-CM

## 2024-10-11 DIAGNOSIS — L578 Other skin changes due to chronic exposure to nonionizing radiation: Secondary | ICD-10-CM

## 2024-10-11 DIAGNOSIS — L570 Actinic keratosis: Secondary | ICD-10-CM | POA: Diagnosis not present

## 2024-10-11 DIAGNOSIS — L820 Inflamed seborrheic keratosis: Secondary | ICD-10-CM | POA: Diagnosis not present

## 2024-10-11 DIAGNOSIS — Z79899 Other long term (current) drug therapy: Secondary | ICD-10-CM

## 2024-10-11 NOTE — Patient Instructions (Addendum)
 Cryotherapy Aftercare  Wash gently with soap and water  everyday.   Apply Vaseline and Band-Aid daily until healed.   Instructions for [facility] Medications  One or more of your medications was sent to the [facility] mail order compounding pharmacy. You will receive an email from them and can purchase the medicine through that link. It will then be mailed to your home at the address you confirmed. If for any reason you do not receive an email from them, please check your spam folder. If you still do not find the email, please let us  know. [facility] phone number is [phone].   5-Fluorouracil /Calcipotriene Patient Education   Actinic keratoses are the dry, red scaly spots on the skin caused by sun damage. A portion of these spots can turn into skin cancer with time, and treating them can help prevent development of skin cancer.   Treatment of these spots requires removal of the defective skin cells. There are various ways to remove actinic keratoses, including freezing with liquid nitrogen, treatment with creams, or treatment with a blue light procedure in the office.   5-fluorouracil  cream is a topical cream used to treat actinic keratoses. It works by interfering with the growth of abnormal fast-growing skin cells, such as actinic keratoses. These cells peel off and are replaced by healthy ones.   5-fluorouracil /calcipotriene is a combination of the 5-fluorouracil  cream with a vitamin D  analog cream called calcipotriene. The calcipotriene alone does not treat actinic keratoses. However, when it is combined with 5-fluorouracil , it helps the 5-fluorouracil  treat the actinic keratoses much faster so that the same results can be achieved with a much shorter treatment time.  - Start 5-fluorouracil /calcipotriene cream twice a day for 5 days to affected areas including temples, lateral cheeks, neck, jaw. Prescription sent to [facility]. Patient advised they  will receive an email to purchase the medication online and have it sent to their home. Patient provided with handout reviewing treatment course and side effects and advised to call or message us  on MyChart with any concerns.  Reviewed course of treatment and expected reaction.  Patient advised to expect inflammation and crusting and advised that erosions are possible.  Patient advised to be diligent with sun protection during and after treatment. Counseled to keep medication out of reach of children and pets.  INSTRUCTIONS FOR 5-FLUOROURACIL /CALCIPOTRIENE CREAM:   5-fluorouracil /calcipotriene cream typically only needs to be used for 4-7 days. A thin layer should be applied twice a day to the treatment areas recommended by your physician.   If your physician prescribed you separate tubes of 5-fluourouracil and calcipotriene, apply a thin layer of 5-fluorouracil  followed by a thin layer of calcipotriene.   Avoid contact with your eyes, nostrils, and mouth. Do not use 5-fluorouracil /calcipotriene cream on infected or open wounds.   You will develop redness, irritation and some crusting at areas where you have pre-cancer damage/actinic keratoses. IF YOU DEVELOP PAIN, BLEEDING, OR SIGNIFICANT CRUSTING, STOP THE TREATMENT EARLY - you have already gotten a good response and the actinic keratoses should clear up well.  Wash your hands after applying 5-fluorouracil  5% cream on your skin.   A moisturizer or sunscreen with a minimum SPF 30 should be applied each morning.   Once you have finished the treatment, you can apply a thin layer of Vaseline twice a day to irritated areas to soothe and calm the areas more quickly. If you experience significant discomfort, contact your physician.  For some patients it is necessary  to repeat the treatment for best results.  SIDE EFFECTS: When using 5-fluorouracil /calcipotriene cream, you may have mild irritation, such as redness, dryness, swelling, or a mild  burning sensation. This usually resolves within 2 weeks. The more actinic keratoses you have, the more redness and inflammation you can expect during treatment. Eye irritation has been reported rarely. If this occurs, please let us  know.  If you have any trouble using this cream, please call the office. If you have any other questions about this information, please do not hesitate to ask me before you leave the office.   Due to recent changes in healthcare laws, you may see results of your pathology and/or laboratory studies on MyChart before the doctors have had a chance to review them. We understand that in some cases there may be results that are confusing or concerning to you. Please understand that not all results are received at the same time and often the doctors may need to interpret multiple results in order to provide you with the best plan of care or course of treatment. Therefore, we ask that you please give us  2 business days to thoroughly review all your results before contacting the office for clarification. Should we see a critical lab result, you will be contacted sooner.   If You Need Anything After Your Visit  If you have any questions or concerns for your doctor, please call our main line at [phone] and press option 4 to reach your doctor's medical assistant. If no one answers, please leave a voicemail as directed and we will return your call as soon as possible. Messages left after 4 pm will be answered the following business day.   You may also send us  a message via MyChart. We typically respond to MyChart messages within 1-2 business days.  For prescription refills, please ask your pharmacy to contact our office. Our fax number is [phone].  If you have an urgent issue when the clinic is closed that cannot wait until the next business day, you can page your doctor at the number below.    Please note that while we do our best to be available for urgent issues outside  of office hours, we are not available 24/7.   If you have an urgent issue and are unable to reach us , you may choose to seek medical care at your doctor's office, retail clinic, urgent care center, or emergency room.  If you have a medical emergency, please immediately call 911 or go to the emergency department.  Pager Numbers  - Dr. Hester: [phone]  - Dr. Jackquline: [phone]  - Dr. Claudene: [phone]   - Dr. Raymund: [phone]  In the event of inclement weather, please call our main line at [phone] for an update on the status of any delays or closures.  Dermatology Medication Tips: Please keep the boxes that topical medications come in in order to help keep track of the instructions about where and how to use these. Pharmacies typically print the medication instructions only on the boxes and not directly on the medication tubes.   If your medication is too expensive, please contact our office at [phone] option 4 or send us  a message through MyChart.   We are unable to tell what your co-pay for medications will be in advance as this is different depending on your insurance coverage. However, we may be able to find a substitute medication at lower cost or fill out paperwork to get insurance to cover a needed medication.  If a prior authorization is required to get your medication covered by your insurance company, please allow us  1-2 business days to complete this process.  Drug prices often vary depending on where the prescription is filled and some pharmacies may offer cheaper prices.  The website www.goodrx.com contains coupons for medications through different pharmacies. The prices here do not account for what the cost may be with help from insurance (it may be cheaper with your insurance), but the website can give you the price if you did not use any insurance.  - You can print the associated coupon and take it with your prescription to the pharmacy.  - You  may also stop by our office during regular business hours and pick up a GoodRx coupon card.  - If you need your prescription sent electronically to a different pharmacy, notify our office through [facility] or by phone at [phone] option 4.     Si Usted Necesita Algo Despus de Su Visita  Tambin puede enviarnos un mensaje a travs de Clinical Cytogeneticist. Por lo general respondemos a los mensajes de MyChart en el transcurso de 1 a 2 das hbiles.  Para renovar recetas, por favor pida a su farmacia que se ponga en contacto con nuestra oficina. Randi lakes de fax es [place] [phone].  Si tiene un asunto urgente cuando la clnica est cerrada y que no puede esperar hasta el siguiente da hbil, puede llamar/localizar a su doctor(a) al nmero que aparece a continuacin.   Por favor, tenga en cuenta que aunque hacemos todo lo posible para estar disponibles para asuntos urgentes fuera del horario de [place], no estamos disponibles las 24 horas del da, los 7 [address]  [address] de la [place].   Si tiene un problema urgente y no puede comunicarse con nosotros, puede optar por buscar atencin mdica  en el consultorio de su doctor(a), en una clnica privada, en un centro de atencin urgente o en una sala de emergencias.  Si tiene engineer, drilling, por favor llame inmediatamente al 911 o vaya a la sala de emergencias.  Nmeros de bper  - Dr. Hester: [phone]  - Dra. Jackquline: [phone]  - Dr. Claudene: [phone]  - Dra. Kitts: [phone]  En caso de inclemencias del [place], por favor llame a nuestra lnea principal al [phone] para una actualizacin sobre el estado de cualquier retraso o cierre.  Consejos para la medicacin en dermatologa: Por favor, guarde las cajas en las que vienen los medicamentos de uso tpico para ayudarle a seguir las instrucciones sobre dnde y cmo usarlos. Las farmacias generalmente imprimen las instrucciones del medicamento slo en las cajas y no  directamente en los tubos del [place].   Si su medicamento es muy caro, por favor, pngase en contacto con landry rieger llamando al [phone] y presione la opcin 4 o envenos un mensaje a travs de Clinical Cytogeneticist.   No podemos decirle cul ser su copago por los medicamentos por adelantado ya que esto es diferente dependiendo de la cobertura de su seguro. Sin embargo, es posible que podamos encontrar un medicamento sustituto a audiological scientist un formulario para que el seguro cubra el medicamento que se considera necesario.   Si se requiere una autorizacin previa para que su compaa de seguros cubra su medicamento, por favor permtanos de 1 a 2 das hbiles para completar este proceso.  Los precios de los medicamentos varan con frecuencia dependiendo del environmental consultant de dnde se surte la receta y alguna farmacias pueden ofrecer precios ms baratos.  El sitio web www.goodrx.com tiene cupones para medicamentos de health and safety inspector. Los precios aqu no tienen en cuenta lo que podra costar con la ayuda del seguro (puede ser ms barato con su seguro), pero el sitio web puede darle el precio si no utiliz tourist information centre manager.  - Puede imprimir el cupn correspondiente y llevarlo con su receta a la farmacia.  - Tambin puede pasar por nuestra oficina durante el horario de atencin regular y education officer, museum una tarjeta de cupones de GoodRx.  - Si necesita que su receta se enve electrnicamente a una farmacia diferente, informe a nuestra oficina a travs de MyChart de  o por telfono llamando al [phone] y presione la opcin 4.

## 2024-10-11 NOTE — Progress Notes (Signed)
 Follow-Up Visit   Subjective  Anthony Mosley is a 89 y.o. male who presents for the following: 6 month follow-up Actinic keratosis. He has treated with 5FU/calcipotriene cream in the past (~2 years ago). The patient has spots, moles and lesions to be evaluated, some may be new or changing, including spot on the right lower neck and behind left ear (irritated). 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    The following portions of the chart were reviewed this encounter and updated as appropriate: medications, allergies, medical history  Review of Systems:  No other skin or systemic complaints except as noted in HPI or Assessment and Plan.  Objective  Well appearing patient in no apparent distress; mood and affect are within normal limits.  A focused examination was performed of the following areas: Face, scalp  Relevant exam findings are noted in the Assessment and Plan.  L postauricular scalp x 2 (2) Erythematous stuck-on, waxy papule or plaque L jaw (residual) x 1, right mid cheek x 1, nasal tip x 1 (3) Pink scaly macules.  Assessment & Plan   INFLAMED SEBORRHEIC KERATOSIS (2) L postauricular scalp x 2 (2) Symptomatic, irritating, patient would like treated. Destruction of lesion - L postauricular scalp x 2 (2)  Destruction method: cryotherapy   Informed consent: discussed and consent obtained   Lesion destroyed using liquid nitrogen: Yes   Region frozen until ice ball extended beyond lesion: Yes   Outcome: patient tolerated procedure well with no complications   Post-procedure details: wound care instructions given   Additional details:  Prior to procedure, discussed risks of blister formation, small wound, skin dyspigmentation, or rare scar following cryotherapy. Recommend Vaseline ointment to treated areas while healing.   AK (ACTINIC KERATOSIS) (3) L jaw (residual) x 1, right mid cheek x 1, nasal tip x 1 (3) Actinic keratoses are  precancerous spots that appear secondary to cumulative UV radiation exposure/sun exposure over time. They are chronic with expected duration over 1 year. A portion of actinic keratoses will progress to squamous cell carcinoma of the skin. It is not possible to reliably predict which spots will progress to skin cancer and so treatment is recommended to prevent development of skin cancer.  Recommend daily broad spectrum sunscreen SPF 30+ to sun-exposed areas, reapply every 2 hours as needed.  Recommend staying in the shade or wearing long sleeves, sun glasses (UVA+UVB protection) and wide brim hats (4-inch brim around the entire circumference of the hat). Call for new or changing lesions. Destruction of lesion - L jaw (residual) x 1, right mid cheek x 1, nasal tip x 1 (3)  Destruction method: cryotherapy   Informed consent: discussed and consent obtained   Lesion destroyed using liquid nitrogen: Yes   Region frozen until ice ball extended beyond lesion: Yes   Outcome: patient tolerated procedure well with no complications   Post-procedure details: wound care instructions given   Additional details:  Prior to procedure, discussed risks of blister formation, small wound, skin dyspigmentation, or rare scar following cryotherapy. Recommend Vaseline ointment to treated areas while healing.    ACTINIC DAMAGE WITH PRECANCEROUS ACTINIC KERATOSES Counseling for Topical Chemotherapy Management: Patient exhibits: - Severe, confluent actinic changes with pre-cancerous actinic keratoses that is secondary to cumulative UV radiation exposure over time - Condition that is severe; chronic, not at goal. - diffuse scaly erythematous macules and papules with underlying dyspigmentation - Discussed Prescription Field Treatment topical Chemotherapy for Severe, Chronic Confluent  Actinic Changes with Pre-Cancerous Actinic Keratoses Field treatment involves treatment of an entire area of skin that has confluent  Actinic Changes (Sun/ Ultraviolet light damage) and PreCancerous Actinic Keratoses by method of PhotoDynamic Therapy (PDT) and/or prescription Topical Chemotherapy agents such as 5-fluorouracil , 5-fluorouracil /calcipotriene, and/or imiquimod.  The purpose is to decrease the number of clinically evident and subclinical PreCancerous lesions to prevent progression to development of skin cancer by chemically destroying early precancer changes that may or may not be visible.  It has been shown to reduce the risk of developing skin cancer in the treated area. As a result of treatment, redness, scaling, crusting, and open sores may occur during treatment course. One or more than one of these methods may be used and may have to be used several times to control, suppress and eliminate the PreCancerous changes. Discussed treatment course, expected reaction, and possible side effects. - Recommend daily broad spectrum sunscreen SPF 30+ to sun-exposed areas, reapply every 2 hours as needed.  - Staying in the shade or wearing long sleeves, sun glasses (UVA+UVB protection) and wide brim hats (4-inch brim around the entire circumference of the hat) are also recommended. - Call for new or changing lesions. - Start 5-fluorouracil /calcipotriene cream twice a day for 5 days to affected areas including temples, lateral cheeks, jaw, neck. Prescription sent to [facility]. Patient advised they will receive an email to purchase the medication online and have it sent to their home. Patient provided with handout reviewing treatment course and side effects and advised to call or message us  on MyChart with any concerns.  Reviewed course of treatment and expected reaction.  Patient advised to expect inflammation and crusting and advised that erosions are possible.  Patient advised to be diligent with sun protection during and after treatment. Counseled to keep medication out of reach of children and  pets.  SEBORRHEIC KERATOSIS - Stuck-on, waxy, tan-brown papules and/or plaques, including right lower neck  - Benign-appearing - Discussed benign etiology and prognosis. 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 - No evidence of recurrence today - Recommend regular full body skin exams - Recommend daily broad spectrum sunscreen SPF 30+ to sun-exposed areas, reapply every 2 hours as needed.  - Call if any new or changing lesions are noted between office visits   Return in about 6 months (around 04/11/2025) for UBSE, Hx SCC, Hx AKs.  IAndrea Kerns, CMA, am acting as scribe for Rexene Rattler, MD .   Documentation: I have reviewed the above documentation for accuracy and completeness, and I agree with the above.  Rexene Rattler, MD

## 2024-10-21 DIAGNOSIS — H02055 Trichiasis without entropian left lower eyelid: Secondary | ICD-10-CM | POA: Diagnosis not present

## 2024-10-21 DIAGNOSIS — Z961 Presence of intraocular lens: Secondary | ICD-10-CM | POA: Diagnosis not present

## 2024-11-05 ENCOUNTER — Other Ambulatory Visit: Payer: Self-pay

## 2024-11-05 DIAGNOSIS — H0019 Chalazion unspecified eye, unspecified eyelid: Secondary | ICD-10-CM | POA: Diagnosis not present

## 2024-11-05 MED ORDER — NEOMYCIN-POLYMYXIN-DEXAMETH 3.5-10000-0.1 OP OINT
1.0000 | TOPICAL_OINTMENT | Freq: Two times a day (BID) | OPHTHALMIC | 1 refills | Status: AC
  Filled 2024-11-05: qty 3.5, 20d supply, fill #0

## 2024-11-06 ENCOUNTER — Other Ambulatory Visit: Payer: Self-pay

## 2024-11-15 ENCOUNTER — Encounter: Payer: Self-pay | Admitting: Pharmacist

## 2024-11-15 NOTE — Progress Notes (Signed)
   11/15/2024  Patient ID: Anthony Mosley, male   DOB: February 23, 1935, 89 y.o.   MRN: [id number]  This patient is appearing on a report for being at risk of failing the adherence measure for diabetes medications this calendar year.   Medication: Farxiga  10 mg Last fill date: 10/27/2024 for 30 day supply  Insurance report was not up to date. No action needed at this time.   Note patient currently receiving 30, rather than 90 day supply. Will plan to outreach prior to next refill  Sharyle Sia, PharmD, [facility] [phone]

## 2024-11-16 ENCOUNTER — Other Ambulatory Visit: Payer: Self-pay

## 2024-11-16 ENCOUNTER — Other Ambulatory Visit: Payer: Self-pay | Admitting: Dermatology

## 2024-11-16 MED ORDER — DOXYCYCLINE HYCLATE 20 MG PO TABS
20.0000 mg | ORAL_TABLET | Freq: Two times a day (BID) | ORAL | 1 refills | Status: AC
  Filled 2024-11-16: qty 180, 90d supply, fill #0

## 2024-11-24 ENCOUNTER — Telehealth: Payer: Self-pay | Admitting: Pharmacist

## 2024-11-24 ENCOUNTER — Other Ambulatory Visit: Payer: Self-pay

## 2024-11-24 DIAGNOSIS — I5022 Chronic systolic (congestive) heart failure: Secondary | ICD-10-CM

## 2024-11-24 NOTE — Progress Notes (Signed)
° °  11/24/2024  Patient ID: Anthony Mosley, male   DOB: 1935/05/07, 89 y.o.   MRN: [id number]  This patient is appearing on a report for being at risk of failing the adherence measure for diabetes medications this calendar year.   Medication: Farxiga  10 mg Last fill date: 10/27/2024 for 30 day supply   Note patient currently receiving 30, rather than 90 day supply, at a time, but cost is currently $0.  Note per review of chart, patient taking Farxiga  for heart failure indication, rather than diabetes.   Reach patient by telephone today  Provide counseling on prescriptions insurance benefits.  Patient shares that for 2026 calendar year he will have [employer] Advantage Plan 1 again. Per review of HTA website, he will have a $250 annual deductible (only impacting tiers 4&5) and tier 3 medications will cost 20% coinsurance. Advise patient that max out of pocket cost on prescription medication for 2026 will be $2,100.  Patient reports that he does not meet criteria for Farxiga  patient assistance.  As requested, collaborate with [facility] on behalf of patient to request Farxiga  prescription be refilled for 90 day supply, if cost is also $0. Pharmacy Roxie Cos confirms that patient's cost will be $0 and pharmacy will get this 90 day supply ready for him   Sharyle Sia, PharmD, [facility] [phone]

## 2024-12-02 MED FILL — Apixaban Tab 5 MG: ORAL | 100 days supply | Qty: 200 | Fill #1 | Status: AC

## 2024-12-03 ENCOUNTER — Ambulatory Visit

## 2024-12-03 DIAGNOSIS — Z0000 Encounter for general adult medical examination without abnormal findings: Secondary | ICD-10-CM

## 2024-12-03 NOTE — Patient Instructions (Addendum)
 Mr. Arnall,  Thank you for taking the time for your Medicare Wellness Visit. I appreciate your continued commitment to your health goals. Please review the care plan we discussed, and feel free to reach out if I can assist you further.  Please note that Annual Wellness Visits do not include a physical exam. Some assessments may be limited, especially if the visit was conducted virtually. If needed, we may recommend an in-person follow-up with your provider.  Ongoing Care Seeing your primary care provider every 3 to 6 months helps us  monitor your health and provide consistent, personalized care.   Referrals If a referral was made during today's visit and you haven't received any updates within two weeks, please contact the referred provider directly to check on the status.  Recommended Screenings:  Health Maintenance  Topic Date Due   COVID-19 Vaccine (3 - Pfizer risk series) 02/29/2020   Flu Shot  07/16/2024   Medicare Annual Wellness Visit  12/03/2025   Pneumococcal Vaccine for age over 51  Completed   Zoster (Shingles) Vaccine  Completed   Meningitis B Vaccine  Aged Out   DTaP/Tdap/Td vaccine  Discontinued       12/03/2024    2:52 PM  Advanced Directives  Does Patient Have a Medical Advance Directive? Yes  Type of Estate Agent of [place];Living will  Does patient want to make changes to medical advance directive? No - Patient declined  Copy of Healthcare Power of Attorney in Chart? Yes - validated most recent copy scanned in chart (See row information)    Vision: Annual vision screenings are recommended for early detection of glaucoma, cataracts, and diabetic retinopathy. These exams can also reveal signs of chronic conditions such as diabetes and high blood pressure.  Dental: Annual dental screenings help detect early signs of oral cancer, gum disease, and other conditions linked to overall health, including heart disease and diabetes.  Please see  the attached documents for additional preventive care recommendations.   NEXT AWV 12/07/25 @ 2:20 PM  IN PERSON

## 2024-12-03 NOTE — Progress Notes (Signed)
 "  Chief Complaint  Patient presents with   Medicare Wellness     Subjective:   Anthony Mosley is a 89 y.o. male who presents for a Medicare Annual Wellness Visit.  Visit info / Clinical Intake: Medicare Wellness Visit Type:: Subsequent Annual Wellness Visit Persons participating in visit and providing information:: patient Medicare Wellness Visit Mode:: Telephone If telephone:: video declined Since this visit was completed virtually, some vitals may be partially provided or unavailable. Missing vitals are due to the limitations of the virtual format.: Unable to obtain vitals - no equipment If Telephone or Video please confirm:: I connected with patient using audio/video enable telemedicine. I verified patient identity with two identifiers, discussed telehealth limitations, and patient agreed to proceed. Patient Location:: home Provider Location:: home office Interpreter Needed?: No Pre-visit prep was completed: yes AWV questionnaire completed by patient prior to visit?: no Living arrangements:: (!) lives alone Patient's Overall Health Status Rating: very good Typical amount of pain: none Does pain affect daily life?: no Are you currently prescribed opioids?: no  Dietary Habits and Nutritional Risks How many meals a day?: 3 Eats fruit and vegetables daily?: yes Most meals are obtained by: preparing own meals In the last 2 weeks, have you had any of the following?: none Diabetic:: no  Functional Status Activities of Daily Living (to include ambulation/medication): Independent Ambulation: Independent Medication Administration: Independent Home Management (perform basic housework or laundry): Independent Manage your own finances?: yes Primary transportation is: driving Concerns about vision?: no *vision screening is required for [facility]* (wears glasses- Dr.Porfilio) Concerns about hearing?: no  Fall Screening Falls in the past year?: 1 Number of falls in past year: 0 Was  there an injury with Fall?: 0 Fall Risk Category Calculator: 1 Patient Fall Risk Level: Low Fall Risk  Fall Risk Patient at Risk for Falls Due to: Impaired balance/gait Fall risk Follow up: Falls evaluation completed; Falls prevention discussed  Home and Transportation Safety: All rugs have non-skid backing?: yes All stairs or steps have railings?: yes Grab bars in the bathtub or shower?: yes Have non-skid surface in bathtub or shower?: yes Good home lighting?: yes Regular seat belt use?: yes Hospital stays in the last year:: no  Cognitive Assessment Difficulty concentrating, remembering, or making decisions? : yes (memory) Will 6CIT or Mini Cog be Completed: yes What year is it?: 0 points What month is it?: 0 points Give patient an address phrase to remember (5 components): [address] [place], [place] what time is it?: 0 points Count backwards from 20 to 1: 0 points Say the months of the year in reverse: 0 points Repeat the address phrase from earlier: 2 points 6 CIT Score: 2 points  Advance Directives (For Healthcare) Does Patient Have a Medical Advance Directive?: Yes Does patient want to make changes to medical advance directive?: No - Patient declined Type of Advance Directive: Healthcare Power of [place]; Living will Copy of Healthcare Power of Attorney in Chart?: Yes - validated most recent copy scanned in chart (See row information) Copy of Living Will in Chart?: Yes - validated most recent copy scanned in chart (See row information)  Reviewed/Updated  Reviewed/Updated: Reviewed All (Medical, Surgical, Family, Medications, Allergies, Care Teams, Patient Goals)    Allergies (verified) Macrolides and ketolides, Norvasc [amlodipine], Avodart [dutasteride], and Tape   Current Medications (verified) Outpatient Encounter Medications as of 12/03/2024  Medication Sig   amoxicillin  (AMOXIL ) 500 MG capsule Take 4 capsules (2,000 mg total) by mouth 1 hour prior  to any dental procedures.   apixaban  (ELIQUIS ) 5 MG TABS tablet Take 1 tablet (5 mg total) by mouth 2 (two) times daily.   atorvastatin  (LIPITOR) 80 MG tablet Take 1 tablet (80 mg total) by mouth daily.   B COMPLEX VITAMINS PO Take 1 tablet by mouth daily.    calcium  carbonate (TUMS - DOSED IN MG ELEMENTAL CALCIUM ) 500 MG chewable tablet Chew 1 tablet by mouth as needed for indigestion or heartburn.   Carboxymethylcellul-Glycerin (LUBRICATING EYE DROPS OP) Apply 1 drop to eye as needed (dry eyes).    cholecalciferol  (VITAMIN D3) 25 MCG (1000 UNIT) tablet Take 1,000 Units by mouth daily.   COLLAGEN PO Take 1 Scoop by mouth daily.   dapagliflozin  propanediol (FARXIGA ) 10 MG TABS tablet Take 1 tablet (10 mg total) by mouth daily before breakfast.   diclofenac  sodium (VOLTAREN ) 1 % GEL Apply 4 g topically 4 (four) times daily. To the knee if needed   doxycycline  (PERIOSTAT ) 20 MG tablet Take 1 tablet (20 mg total) by mouth 2 (two) times daily. Take with food and drink   fluticasone  (FLONASE ) 50 MCG/ACT nasal spray Place 1 spray into both nostrils daily as needed (congestion).   furosemide  (LASIX ) 20 MG tablet TAKE 1 TABLET EVERY DAY AS NEEDED   glucose blood test strip Use to check blood sugar as needed max of one time daily   lisinopril  (ZESTRIL ) 2.5 MG tablet Take 1 tablet (2.5 mg total) by mouth daily.   metroNIDAZOLE  (METROGEL ) 0.75 % gel Apply to nose and cheeks every night for rosacea.   neomycin -polymyxin b -dexamethasone  (MAXITROL ) 3.5-10000-0.1 OINT Apply a small amount on eyelid as directed apply a small amount on left lower lid twice a day   Potassium 99 MG TABS Take 99 mg by mouth daily. Leg cramps   Tetrahydroz-Dextran-PEG-Povid (EYE DROPS ADVANCED RELIEF OP) Apply 1 drop to eye as needed.   No facility-administered encounter medications on file as of 12/03/2024.    History: Past Medical History:  Diagnosis Date   Actinic keratosis 02/04/2023   L post neck   Aortic atherosclerosis     Aortic root dilatation    a.) TTE 05/29/2017 --> measured 28 mm. b.)TTE 10/25/2020 --> measured 40 mm.; 45 mm by CTA chest, most recently 8/22   Arthritis    B12 deficiency    Bilateral lower extremity edema    BPH (benign prostatic hyperplasia)    Bulla of lung (HCC)    a. seen on CT.   CAD (coronary artery disease)    a.  Elective R/LHC 06/13/17 showed mild-moderate nonobstructive RCA CAD with mild LAD myocardial bridging but no significant obstructive CAD; there was mildly elevated L heart filling pressure, moderately elevated R heart filling pressure, and mild pulm HTN.   Chronic anticoagulation    Apixaban    Chronic knee pain    CKD (chronic kidney disease), stage III (HCC)    Cough    DJD (degenerative joint disease)    Erectile dysfunction    Former tobacco use    GERD (gastroesophageal reflux disease)    occ-tums prn   Hemorrhoids    HFrEF (heart failure with reduced ejection fraction) (HCC)    Hyperbilirubinemia    Hyperlipidemia    Mild aortic insufficiency    Mild pulmonary hypertension (HCC)    Moderate mitral regurgitation    NICM (nonischemic cardiomyopathy) (HCC)    a. EF 40-45% by echo 05/2017, improved to 45-50% by echo 10/2020.   PAC (premature atrial contraction)  Paroxysmal A-fib (HCC)    Pleural effusion, left 11/2018   Pneumonia    Prolonged QT syndrome    RBBB    Rosacea    Ruptured spleen    SCC (squamous cell carcinoma) 03/22/2024   SCC IS,  L medial upper ankle, [facility] 04/21/2024   Skin cancer    Left wrist - tx by Dr Charlott in the past.   Squamous cell carcinoma of skin 11/27/2018   L inf mandible    Squamous cell carcinoma of skin 09/04/2023   R postauricular hairline   Thoracic aortic aneurysm    a.) TTE 05/29/2017 --> 4.2 cm . b.) CT 12/2017 --> measured 4.4 cm, imaged incompletely at 4.3cm in 10/2018 CT. c.) CTA 07/27/2021 --> measured 4.5 cm.   Varicocele    Wears dentures    partial upper   Wheezing    Past Surgical History:   Procedure Laterality Date   ABDOMINAL SURGERY     due to ruptured spleen   APPENDECTOMY     Dr Rosena   CARDIAC CATHETERIZATION Left 07/20/2004   Procedure: CARDIAC CATHETERIZATION; Location: [facility]; Surgeon: Wolm Nida, MD   CARDIAC CATHETERIZATION Left 01/29/2001   Procedure: CARDIAC CATHETERIZATION; Location: [facility]; Surgeon: Denyse Bathe, MD   CATARACT EXTRACTION W/PHACO Right 10/24/2015   Procedure: CATARACT EXTRACTION PHACO AND INTRAOCULAR LENS PLACEMENT (IOC);  Surgeon: Elsie Carmine, MD;  Location: [facility];  Service: Ophthalmology;  Laterality: Right;  US  00:52 AP% 23.5 CDE 12.35 fluid pack lot # [id number]   CATARACT EXTRACTION W/PHACO Left 11/14/2015   Procedure: CATARACT EXTRACTION PHACO AND INTRAOCULAR LENS PLACEMENT (IOC);  Surgeon: Elsie Carmine, MD;  Location: [facility];  Service: Ophthalmology;  Laterality: Left;  US  00:55 AP% 19.1 CDE 10.66 fluid pack lot # [id number]   COLONOSCOPY  1987   COLONOSCOPY WITH PROPOFOL  N/A 08/22/2017   Procedure: COLONOSCOPY WITH PROPOFOL ;  Surgeon: Jinny Carmine, MD;  Location: [facility];  Service: Gastroenterology;  Laterality: N/A;   HERNIA REPAIR     umbilical/ Dr Rosena FONTANA HERNIA REPAIR Right 02/04/2019   Procedure: HERNIA REPAIR INGUINAL WITH MESH;  Surgeon: Desiderio Schanz, MD;  Location: [facility];  Service: General;  Laterality: Right;   IR ANGIOGRAM VISCERAL SELECTIVE  10/29/2018   IR EMBO ART  VEN HEMORR LYMPH EXTRAV  INC GUIDE ROADMAPPING  10/29/2018   IR THORACENTESIS ASP PLEURAL SPACE W/IMG GUIDE  11/23/2018   IR US  GUIDE VASC ACCESS RIGHT  10/29/2018   KNEE ARTHROPLASTY Right 09/19/2021   Procedure: COMPUTER ASSISTED TOTAL KNEE ARTHROPLASTY;  Surgeon: Mardee Lynwood SQUIBB, MD;  Location: [facility];  Service: Orthopedics;  Laterality: Right;   KNEE ARTHROSCOPY Bilateral    POLYPECTOMY  08/22/2017   Procedure: POLYPECTOMY;  Surgeon: Jinny Carmine, MD;  Location: [facility];  Service: Gastroenterology;;    RIGHT/LEFT HEART CATH AND CORONARY ANGIOGRAPHY N/A 06/13/2017   Procedure: Right/Left Heart Cath and Coronary Angiography;  Surgeon: Mady Bruckner, MD;  Location: [facility];  Service: Cardiovascular;  Laterality: N/A;   TONSILLECTOMY     Family History  Problem Relation Age of Onset   Alzheimer's disease Mother    Pulmonary embolism Father    Diabetes Father    Heart attack Father 23   Diabetes Brother    Heart disease Brother        CABG   Heart disease Brother        CABG   Diabetes Brother    Diabetes Brother    Healthy  Brother    Social History   Occupational History   Occupation: Retired  Tobacco Use   Smoking status: Former    Current packs/day: 0.00    Average packs/day: 1.5 packs/day for 20.0 years (30.0 ttl pk-yrs)    Types: Cigarettes    Start date: 60    Quit date: 62    Years since quitting: 38.9   Smokeless tobacco: Former    Types: Chew    Quit date: 2000   Tobacco comments:    smoking cessation materials not required  Vaping Use   Vaping status: Never Used  Substance and Sexual Activity   Alcohol  use: No    Alcohol /week: 0.0 standard drinks of alcohol    Drug use: No   Sexual activity: Never   Tobacco Counseling Counseling given: Not Answered Tobacco comments: smoking cessation materials not required  SDOH Screenings   Food Insecurity: No Food Insecurity (09/30/2024)   [facility]  Housing: Low Risk  (09/30/2024)   [facility]  Transportation Needs: No Transportation Needs (09/30/2024)   Received [facility]  Utilities: Not At Risk (09/30/2024)   Received [facility]  Alcohol  Screen: Low Risk (09/05/2023)  Depression (PHQ2-9): Low Risk (12/03/2024)  Financial Resource Strain: Low Risk  (09/30/2024)   Received from [facility]  Physical Activity: Sufficiently Active (12/03/2024)  Social Connections:  Moderately Isolated (09/05/2023)  Stress: No Stress Concern Present (12/03/2024)  Tobacco Use: Medium Risk (12/03/2024)  Health Literacy: Adequate Health Literacy (09/05/2023)   See flowsheets for full screening details  Depression Screen PHQ 2 & 9 Depression Scale- Over the past 2 weeks, how often have you been bothered by any of the following problems? Little interest or pleasure in doing things: 0 Feeling down, depressed, or hopeless (PHQ Adolescent also includes...irritable): 0 PHQ-2 Total Score: 0 Trouble falling or staying asleep, or sleeping too much: 0 Feeling tired or having little energy: 0 Poor appetite or overeating (PHQ Adolescent also includes...weight loss): 0 Feeling bad about yourself - or that you are a failure or have let yourself or your family down: 0 Trouble concentrating on things, such as reading the newspaper or watching television (PHQ Adolescent also includes...like school work): 0 Moving or speaking so slowly that other people could have noticed. Or the opposite - being so fidgety or restless that you have been moving around a lot more than usual: 0 Thoughts that you would be better off dead, or of hurting yourself in some way: 0 PHQ-9 Total Score: 0 If you checked off any problems, how difficult have these problems made it for you to do your work, take care of things at home, or get along with other people?: Not difficult at all  Depression Treatment Depression Interventions/Treatment : EYV7-0 Score <4 Follow-up Not Indicated     Goals Addressed             This Visit's Progress    DIET - REDUCE SUGAR INTAKE               Objective:    There were no vitals filed for this visit. There is no height or weight on file to calculate BMI.  Hearing/Vision screen Hearing Screening - Comments:: NO AIDS Vision Screening - Comments:: WEARS GLASSES- DR.PORFILIO Immunizations and Health Maintenance Health Maintenance  Topic Date Due   COVID-19 Vaccine  (3 - Pfizer risk series) 02/29/2020   Influenza Vaccine  07/16/2024  Medicare Annual Wellness (AWV)  12/03/2025   Pneumococcal Vaccine: 50+ Years  Completed   Zoster Vaccines- Shingrix  Completed   Meningococcal B Vaccine  Aged Out   DTaP/Tdap/Td  Discontinued        Assessment/Plan:  This is a routine wellness examination for Anthony Mosley.  Patient Care Team: Edman Marsa PARAS, DO as PCP - General (Family Medicine) End, Lonni, MD as PCP - Cardiology (Cardiology) Waddell Thom SAUNDERS, [place] (Inactive) as Physician Assistant (Physician Assistant) Jaye Fallow, MD as Referring Physician (Ophthalmology) Jackquline Sawyer, MD as Consulting Physician (Dermatology) Abigail, Bernardino HERO, PA-C as Physician Assistant (Physician Assistant) Gladis Cough, MD as Consulting Physician (General Surgery)  I have personally reviewed and noted the following in the patients chart:   Medical and social history Use of alcohol , tobacco or illicit drugs  Current medications and supplements including opioid prescriptions. Functional ability and status Nutritional status Physical activity Advanced directives List of other physicians Hospitalizations, surgeries, and ER visits in previous 12 months Vitals Screenings to include cognitive, depression, and falls Referrals and appointments  No orders of the defined types were placed in this encounter.  In addition, I have reviewed and discussed with patient certain preventive protocols, quality metrics, and best practice recommendations. A written personalized care plan for preventive services as well as general preventive health recommendations were provided to patient.   Anthony GORMAN Das, LPN   [id number]/80/7974   Return in 1 year (on 12/03/2025).  After Visit Summary: (MyChart) Due to this being a telephonic visit, the after visit summary with patients personalized plan was offered to patient via MyChart   Nurse Notes: UTD ON SHOTS EXCEPT FLU; AGED OUT OF  COLONOSCOPY   "

## 2024-12-06 ENCOUNTER — Other Ambulatory Visit: Payer: Self-pay

## 2024-12-10 ENCOUNTER — Other Ambulatory Visit: Payer: Self-pay

## 2024-12-10 MED ORDER — RSVPREF3 VAC RECOMB ADJUVANTED 120 MCG/0.5ML IM SUSR
0.5000 mL | Freq: Once | INTRAMUSCULAR | 0 refills | Status: AC
  Filled 2024-12-10: qty 0.5, 1d supply, fill #0

## 2024-12-10 MED ORDER — FLUZONE HIGH-DOSE 0.5 ML IM SUSY
0.5000 mL | PREFILLED_SYRINGE | Freq: Once | INTRAMUSCULAR | 0 refills | Status: AC
  Filled 2024-12-10: qty 0.5, 1d supply, fill #0

## 2024-12-20 ENCOUNTER — Encounter: Payer: Self-pay | Admitting: Family Medicine

## 2024-12-20 ENCOUNTER — Ambulatory Visit: Admitting: Family Medicine

## 2024-12-20 VITALS — BP 120/68 | Ht 73.0 in | Wt 190.1 lb

## 2024-12-20 DIAGNOSIS — I5022 Chronic systolic (congestive) heart failure: Secondary | ICD-10-CM | POA: Diagnosis not present

## 2024-12-20 DIAGNOSIS — R7303 Prediabetes: Secondary | ICD-10-CM | POA: Diagnosis not present

## 2024-12-20 DIAGNOSIS — N1831 Chronic kidney disease, stage 3a: Secondary | ICD-10-CM | POA: Diagnosis not present

## 2024-12-20 DIAGNOSIS — I480 Paroxysmal atrial fibrillation: Secondary | ICD-10-CM

## 2024-12-20 DIAGNOSIS — E785 Hyperlipidemia, unspecified: Secondary | ICD-10-CM | POA: Diagnosis not present

## 2024-12-20 LAB — POCT GLYCOSYLATED HEMOGLOBIN (HGB A1C): Hemoglobin A1C: 5.7 % — AB (ref 4.0–5.6)

## 2024-12-20 NOTE — Patient Instructions (Addendum)
 Thank you for coming to the office today.  Recent Labs    06/09/24 0806 12/20/24 0827  HGBA1C 5.9* 5.7*     DUE for FASTING BLOOD WORK (no food or drink after midnight before the lab appointment, only water  or coffee without cream/sugar on the morning of)  SCHEDULE Lab Only visit in the morning at the clinic for lab draw in 6 MONTHS   - Make sure Lab Only appointment is at about 1 week before your next appointment, so that results will be available  For Lab Results, once available within 2-3 days of blood draw, you can can log in to MyChart online to view your results and a brief explanation. Also, we can discuss results at next follow-up visit.   Please schedule a Follow-up Appointment to: Return for 6 month fasting lab > 1 week later Annual Physical.  If you have any other questions or concerns, please feel free to call the office or send a message through MyChart. You may also schedule an earlier appointment if necessary.  Additionally, you may be receiving a survey about your experience at our office within a few days to 1 week by e-mail or mail. We value your feedback.  Marsa Officer, DO [facility], [place]

## 2024-12-20 NOTE — Progress Notes (Signed)
 "  Subjective:    Patient ID: Anthony Mosley, male    DOB: 28-May-1935, 89 y.o.   MRN: [id number]  Anthony Mosley is a 89 y.o. male presenting on 12/20/2024 for Medical Management of Chronic Issues   HPI  Discussed the use of AI scribe software for clinical note transcription with the patient, who gave verbal consent to proceed.  History of Present Illness   Anthony Mosley is an 89 year old male who presents for a six-month blood sugar check.   Unintentional weight loss - Gradual weight loss over time - Current weight 184 pounds, previously 190 pounds - Attributes some weight fluctuation to dietary changes, such as eating cornbread, which he feels helps him gain weight   Chronic Systolic Congestive Heart Failure Followed by Cardiology [facility] Dr End ECHO stable 35-405% - On Lisinopril  and on Farxiga  - Euvolemic stable. He has Furosemide  if needed  Paroxysmal Atrial Fibrillation Chronic stable On anticoagulation Eliquis    Osteoarthritis Bilateral   Chronic Knee Pain, bilateral lower ext S/p R knee TKR =- Dr Mardee LAMY He has done well overall, still sees Ortho yearly and X-ray   CKD-III a Last labs reviewed Cr 1.36 eGFR 51   Pre-Diabetes Last A1c 5.7 improved 12/2024 from 5.9 He checks CBG occasional, 1 x  per week. Usually controlled < 100 fasting. He does eat some sweets, ice cream dessert   Hyperlipidemia Last result LDL 42, controlled on med On Atorvastatin  80mg  daily needs refill   Major depression recurrent, mild Currently not on therapy Mood is stable, see below   Aortic Atherosclerosis Thoracic aortic aneurysm On imaging On anticoagulation    Ophthalmology concern Left Eyelid issue with abnormal eyelashes, has upcoming surgery to remove / excision to avoid eye irritation Upcoming surgery 04/01/25    Health Maintenance:   PSA 0.85 (negative), past year 0.79     Last Colonoscopy age 54.      12/20/2024    8:57 AM 12/03/2024    3:03 PM  06/16/2024    8:16 AM  Depression screen PHQ 2/9  Decreased Interest 0 0 0  Down, Depressed, Hopeless 0 0 0  PHQ - 2 Score 0 0 0  Altered sleeping 0 0 0  Tired, decreased energy 0 0 0  Change in appetite 0 0 0  Feeling bad or failure about yourself  0 0 0  Trouble concentrating 1 0 0  Moving slowly or fidgety/restless 0 0 0  Suicidal thoughts 0 0 0  PHQ-9 Score 1 0 0   Difficult doing work/chores Not difficult at all Not difficult at all Not difficult at all     Data saved with a previous flowsheet row definition       12/20/2024    8:57 AM 06/16/2024    8:16 AM 10/15/2023    2:33 PM 06/03/2023    8:25 AM  GAD 7 : Generalized Anxiety Score  Nervous, Anxious, on Edge 0 0 0 0  Control/stop worrying 0 0 0 0  Worry too much - different things 0 0 0 0  Trouble relaxing 0 0 0 0  Restless 0 0 0 0  Easily annoyed or irritable 0 0 0 0  Afraid - awful might happen 0 0 0 0  Total GAD 7 Score 0 0 0 0  Anxiety Difficulty Not difficult at all Not difficult at all      Social History[1]  Review of Systems Per HPI unless specifically indicated above  Objective:    BP 120/68 (BP Location: Left Arm, Patient Position: Sitting, Cuff Size: Normal)   Ht 6' 1 (1.854 m)   Wt 190 lb 2 oz (86.2 kg)   BMI 25.08 kg/m   Wt Readings from Last 3 Encounters:  12/20/24 190 lb 2 oz (86.2 kg)  10/07/24 194 lb 6.4 oz (88.2 kg)  07/06/24 189 lb 3.2 oz (85.8 kg)    Physical Exam Vitals and nursing note reviewed.  Constitutional:      General: He is not in acute distress.    Appearance: He is well-developed. He is not diaphoretic.     Comments: Well-appearing, comfortable, cooperative  HENT:     Head: Normocephalic and atraumatic.  Eyes:     General:        Right eye: No discharge.        Left eye: No discharge.     Conjunctiva/sclera: Conjunctivae normal.  Neck:     Thyroid : No thyromegaly.  Cardiovascular:     Rate and Rhythm: Normal rate and regular rhythm.     Pulses: Normal pulses.      Heart sounds: Normal heart sounds. No murmur heard. Pulmonary:     Effort: Pulmonary effort is normal. No respiratory distress.     Breath sounds: Normal breath sounds. No wheezing or rales.  Musculoskeletal:        General: Normal range of motion.     Cervical back: Normal range of motion and neck supple.     Right lower leg: No edema.     Left lower leg: No edema.  Lymphadenopathy:     Cervical: No cervical adenopathy.  Skin:    General: Skin is warm and dry.     Findings: No erythema or rash.  Neurological:     Mental Status: He is alert and oriented to person, place, and time. Mental status is at baseline.  Psychiatric:        Behavior: Behavior normal.     Comments: Well groomed, good eye contact, normal speech and thoughts     Recent Labs    06/09/24 0806 12/20/24 0827  HGBA1C 5.9* 5.7*    Results for orders placed or performed in visit on 12/20/24  POCT HgB A1C   Collection Time: 12/20/24  8:27 AM  Result Value Ref Range   Hemoglobin A1C 5.7 (A) 4.0 - 5.6 %   HbA1c POC (<> result, manual entry)     HbA1c, POC (prediabetic range)     HbA1c, POC (controlled diabetic range)        Assessment & Plan:   Problem List Items Addressed This Visit     Chronic kidney disease, stage 3a (HCC)   Congestive heart failure (CHF) (HCC)   Dyslipidemia   PAF (paroxysmal atrial fibrillation) (HCC)   Pre-diabetes - Primary   Relevant Orders   POCT HgB A1C (Completed)     Prediabetes Hemoglobin A1c improved to 5.7%, remains in prediabetes phase. - Continue current dietary habits and monitor blood sugar levels.  Chronic systolic heart failure Followed by Cardiology Mildly reduced ejection fraction. Fluid status well-managed. Weight decreased to 184-185 lbs. Current medications include Farxiga , metoprolol , lisinopril , and furosemide  as needed. - Continue current heart failure management regimen. - Monitor weight and fluid status. - Use furosemide  as needed for fluid  management.  Paroxysmal atrial fibrillation Managed with anticoagulation therapy. No major symptoms reported. - Continue anticoagulation therapy.  Chronic kidney disease, stage 3a Chronic kidney disease stage 3a. No acute issues reported. -  Continue current management and monitor kidney function.  General health maintenance Up to date on vaccinations including pneumonia, flu, and RSV. - Continue routine health maintenance and vaccinations as needed.        Orders Placed This Encounter  Procedures   POCT HgB A1C    No orders of the defined types were placed in this encounter.   Follow up plan: Return for 6 month fasting lab > 1 week later Annual Physical.  Future labs ordered for 06/22/25  Marsa Officer, DO [facility] 12/20/2024, 8:37 AM     [1]  Social History Tobacco Use   Smoking status: Former    Current packs/day: 0.00    Average packs/day: 1.5 packs/day for 20.0 years (30.0 ttl pk-yrs)    Types: Cigarettes    Start date: 11    Quit date: 38    Years since quitting: 39.0   Smokeless tobacco: Former    Types: Chew    Quit date: 2000   Tobacco comments:    smoking cessation materials not required  Vaping Use   Vaping status: Never Used  Substance Use Topics   Alcohol  use: No    Alcohol /week: 0.0 standard drinks of alcohol    Drug use: No   "

## 2024-12-21 ENCOUNTER — Other Ambulatory Visit: Payer: Self-pay | Admitting: Family Medicine

## 2024-12-21 DIAGNOSIS — I5022 Chronic systolic (congestive) heart failure: Secondary | ICD-10-CM

## 2024-12-21 DIAGNOSIS — E785 Hyperlipidemia, unspecified: Secondary | ICD-10-CM

## 2024-12-21 DIAGNOSIS — Z0000 Encounter for general adult medical examination without abnormal findings: Secondary | ICD-10-CM

## 2024-12-21 DIAGNOSIS — R7303 Prediabetes: Secondary | ICD-10-CM

## 2024-12-21 DIAGNOSIS — I480 Paroxysmal atrial fibrillation: Secondary | ICD-10-CM

## 2024-12-21 DIAGNOSIS — R351 Nocturia: Secondary | ICD-10-CM

## 2024-12-21 DIAGNOSIS — N1831 Chronic kidney disease, stage 3a: Secondary | ICD-10-CM

## 2025-01-07 ENCOUNTER — Other Ambulatory Visit: Payer: Self-pay

## 2025-04-01 ENCOUNTER — Ambulatory Visit: Admit: 2025-04-01 | Admitting: Ophthalmology

## 2025-04-11 ENCOUNTER — Ambulatory Visit: Admitting: Dermatology

## 2025-06-22 ENCOUNTER — Other Ambulatory Visit

## 2025-07-01 ENCOUNTER — Encounter: Admitting: Family Medicine

## 2025-12-07 ENCOUNTER — Ambulatory Visit
